# Patient Record
Sex: Female | Born: 1950
Health system: Southern US, Community
[De-identification: ages and names within clinical notes are randomized; demographics above are authoritative.]

## PROBLEM LIST (undated history)

## (undated) DIAGNOSIS — G4730 Sleep apnea, unspecified: Secondary | ICD-10-CM

## (undated) DIAGNOSIS — I10 Essential (primary) hypertension: Secondary | ICD-10-CM

## (undated) DIAGNOSIS — I890 Lymphedema, not elsewhere classified: Secondary | ICD-10-CM

## (undated) DIAGNOSIS — S81809A Unspecified open wound, unspecified lower leg, initial encounter: Secondary | ICD-10-CM

## (undated) DIAGNOSIS — L0390 Cellulitis, unspecified: Secondary | ICD-10-CM

## (undated) DIAGNOSIS — L97909 Non-pressure chronic ulcer of unspecified part of unspecified lower leg with unspecified severity: Secondary | ICD-10-CM

## (undated) HISTORY — DX: Non-pressure chronic ulcer of unspecified part of unspecified lower leg with unspecified severity: L97.909

## (undated) HISTORY — PX: ABDOMINAL HYSTERECTOMY: SHX81

## (undated) HISTORY — DX: Unspecified open wound, unspecified lower leg, initial encounter: S81.809A

---

## 2001-05-18 ENCOUNTER — Emergency Department (HOSPITAL_COMMUNITY): Admission: EM | Admit: 2001-05-18 | Discharge: 2001-05-18 | Payer: Self-pay | Admitting: Emergency Medicine

## 2007-02-12 ENCOUNTER — Ambulatory Visit (HOSPITAL_COMMUNITY): Admission: RE | Admit: 2007-02-12 | Discharge: 2007-02-12 | Payer: Self-pay | Admitting: Internal Medicine

## 2007-02-19 ENCOUNTER — Encounter: Admission: RE | Admit: 2007-02-19 | Discharge: 2007-02-19 | Payer: Self-pay | Admitting: Internal Medicine

## 2007-03-22 ENCOUNTER — Ambulatory Visit (HOSPITAL_BASED_OUTPATIENT_CLINIC_OR_DEPARTMENT_OTHER): Admission: RE | Admit: 2007-03-22 | Discharge: 2007-03-22 | Payer: Self-pay | Admitting: Internal Medicine

## 2007-03-31 ENCOUNTER — Ambulatory Visit: Payer: Self-pay | Admitting: Internal Medicine

## 2008-02-22 ENCOUNTER — Encounter: Payer: Self-pay | Admitting: Internal Medicine

## 2008-03-06 ENCOUNTER — Encounter: Payer: Self-pay | Admitting: Internal Medicine

## 2008-04-12 ENCOUNTER — Encounter: Payer: Self-pay | Admitting: Internal Medicine

## 2008-07-09 ENCOUNTER — Encounter: Payer: Self-pay | Admitting: Internal Medicine

## 2008-09-16 ENCOUNTER — Encounter: Payer: Self-pay | Admitting: Internal Medicine

## 2008-09-18 ENCOUNTER — Encounter: Payer: Self-pay | Admitting: Internal Medicine

## 2008-10-04 ENCOUNTER — Encounter: Payer: Self-pay | Admitting: Internal Medicine

## 2008-12-25 DIAGNOSIS — G4733 Obstructive sleep apnea (adult) (pediatric): Secondary | ICD-10-CM | POA: Insufficient documentation

## 2008-12-26 ENCOUNTER — Encounter: Payer: Self-pay | Admitting: Internal Medicine

## 2008-12-27 ENCOUNTER — Telehealth: Payer: Self-pay | Admitting: Internal Medicine

## 2010-10-17 NOTE — Progress Notes (Signed)
Summary: appt to establish care for cpap  Phone Note Call from Patient   Caller: Patient Call For: Dr. 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  Pt stated that her primary care physican Dr. Domingo Pulse would manage her CPAP Issues and refuse to schedule appt here. I advised her that I would contact [facility]) for her and advise them to fax over cpap information for her to address.  Please advise if I need to do anything else. Thanks Initial call taken by: Alfonso Ramus,  December 27, 2008 5:03 PM  Follow-up for Phone Call        Noted Follow-up by: Waymon Budge MD,  December 27, 2008 5:09 PM

## 2010-10-17 NOTE — Letter (Signed)
Summary: CMN/CPAP/[facility]  CMN/CPAP/[facility]   Imported By: Lester [place] 04/20/2008 12:39:39  _____________________________________________________________________  External Attachment:    Type:   Image     Comment:   External Document

## 2010-10-17 NOTE — Letter (Signed)
Summary: CMN-CPAP Equipment and Supplies/[facility]  CMN-CPAP Equipment and Supplies/[facility]   Imported By: Esmeralda Links D'jimraou 06/07/2008 13:32:28  _____________________________________________________________________  External Attachment:    Type:   Image     Comment:   External Document

## 2011-01-28 NOTE — Procedures (Signed)
NAMETHEO, REITHER              ACCOUNT NO.:  [id number]   MEDICAL RECORD NO.:  [id number]          PATIENT TYPE:  OUT   LOCATION:  SLEEP CENTER                 FACILITY:  [facility]   PHYSICIAN:  Clinton D. Maple Hudson, MD, FCCP, FACPDATE OF BIRTH:  1951-01-07   DATE OF STUDY:  03/22/2007                            NOCTURNAL POLYSOMNOGRAM   REFERRING PHYSICIAN:  Kyung Rudd   INDICATION FOR STUDY:  Hypersomnia with sleep apnea.   EPWORTH SLEEPINESS SCORE:  6/24.  BMI 37.8.  Weight 214 pounds.   MEDICATIONS:  Home medications are listed and reviewed.   SLEEP ARCHITECTURE:  Total sleep time 305 minutes with sleep efficiency  86%.  Stage N1 was 4%.  Stage N2 72%.  Stage N3 was 11%.  REM 12% of  total sleep time.  Sleep latency 20 minutes.  REM latency 80 minutes.  Awake after sleep onset 28 minutes.  Arousal index 2.9.  no bedtime  medication was taken.   RESPIRATORY DATA:  Diagnostic NPSG protocol as ordered.  AHI/RDI 12.2  obstructive events per hour indicating mild obstructive sleep  apnea/hypopnea syndrome. There were 11 obstructive apnea's and 51  hypopnea's.  Events were substantially more common while supine but also  significant in each lateral position.  REM AHI 60 per hour.   OXYGEN DATA:  Moderate to loud snoring with oxygen desaturation to a  nadir of 85%.  Mean oxygen saturation through the study was 96% on room  air.   CARDIAC DATA:  Sinus rhythm with occasional sinus arrhythmia and PAC's.   MOVEMENT-PARASOMNIA:  No significant movement disturbance.  Bathroom x1.   IMPRESSIONS-RECOMMENDATIONS:  1. Mild obstructive sleep apnea with apnea/hypopnea index.      Apnea/hypopnea index 12.2 per hour with events most common while      supine and in REM.  Moderate to loud snoring with oxygen      desaturation to a nadir of 85%.  2. Scores in this range may be addressed with CPAP if more      conservative measures are insufficient.  Consider      sleep off flat of back,  weight loss and treatment for any      significant nasal or upper airway obstruction.  If clinically      indicated, consider return for CPAP titration or alternative      therapies as appropriate.      Clinton D. Maple Hudson, MD, [facility], FACP  Diplomate, Biomedical engineer of Sleep Medicine  Electronically Signed     CDY/MEDQ  D:  03/31/2007 15:15:30  T:  04/01/2007 11:04:08  Job:  [id number]

## 2013-08-21 ENCOUNTER — Encounter (HOSPITAL_COMMUNITY): Payer: Self-pay | Admitting: Emergency Medicine

## 2013-08-21 ENCOUNTER — Inpatient Hospital Stay (HOSPITAL_COMMUNITY)
Admission: EM | Admit: 2013-08-21 | Discharge: 2013-09-01 | DRG: 575 | Disposition: A | Discharge status: Home / Self Care | Payer: 59 | Attending: Internal Medicine | Admitting: Internal Medicine

## 2013-08-21 DIAGNOSIS — Z8249 Family history of ischemic heart disease and other diseases of the circulatory system: Secondary | ICD-10-CM

## 2013-08-21 DIAGNOSIS — I10 Essential (primary) hypertension: Secondary | ICD-10-CM | POA: Diagnosis present

## 2013-08-21 DIAGNOSIS — T368X5A Adverse effect of other systemic antibiotics, initial encounter: Secondary | ICD-10-CM | POA: Diagnosis not present

## 2013-08-21 DIAGNOSIS — Z6836 Body mass index (BMI) 36.0-36.9, adult: Secondary | ICD-10-CM

## 2013-08-21 DIAGNOSIS — E876 Hypokalemia: Secondary | ICD-10-CM

## 2013-08-21 DIAGNOSIS — R509 Fever, unspecified: Secondary | ICD-10-CM | POA: Diagnosis not present

## 2013-08-21 DIAGNOSIS — G4733 Obstructive sleep apnea (adult) (pediatric): Secondary | ICD-10-CM | POA: Diagnosis present

## 2013-08-21 DIAGNOSIS — L02419 Cutaneous abscess of limb, unspecified: Principal | ICD-10-CM | POA: Diagnosis present

## 2013-08-21 DIAGNOSIS — L0390 Cellulitis, unspecified: Secondary | ICD-10-CM | POA: Diagnosis present

## 2013-08-21 DIAGNOSIS — Z889 Allergy status to unspecified drugs, medicaments and biological substances status: Secondary | ICD-10-CM

## 2013-08-21 DIAGNOSIS — A4901 Methicillin susceptible Staphylococcus aureus infection, unspecified site: Secondary | ICD-10-CM

## 2013-08-21 DIAGNOSIS — L03119 Cellulitis of unspecified part of limb: Secondary | ICD-10-CM

## 2013-08-21 DIAGNOSIS — L0291 Cutaneous abscess, unspecified: Secondary | ICD-10-CM

## 2013-08-21 DIAGNOSIS — R21 Rash and other nonspecific skin eruption: Secondary | ICD-10-CM | POA: Diagnosis not present

## 2013-08-21 DIAGNOSIS — Z91199 Patient's noncompliance with other medical treatment and regimen due to unspecified reason: Secondary | ICD-10-CM

## 2013-08-21 DIAGNOSIS — Z9119 Patient's noncompliance with other medical treatment and regimen: Secondary | ICD-10-CM

## 2013-08-21 HISTORY — DX: Essential (primary) hypertension: I10

## 2013-08-21 LAB — CBC WITH DIFFERENTIAL/PLATELET
Basophils Absolute: 0 10*3/uL (ref 0.0–0.1)
Basophils Relative: 0 % (ref 0–1)
Eosinophils Absolute: 0 10*3/uL (ref 0.0–0.7)
Eosinophils Relative: 0 % (ref 0–5)
HCT: 38.6 % (ref 36.0–46.0)
Hemoglobin: 13.2 g/dL (ref 12.0–15.0)
Lymphocytes Relative: 19 % (ref 12–46)
Lymphs Abs: 2.6 10*3/uL (ref 0.7–4.0)
MCH: 28.8 pg (ref 26.0–34.0)
MCHC: 34.2 g/dL (ref 30.0–36.0)
MCV: 84.3 fL (ref 78.0–100.0)
Monocytes Absolute: 1.4 10*3/uL — ABNORMAL HIGH (ref 0.1–1.0)
Monocytes Relative: 10 % (ref 3–12)
Neutro Abs: 9.8 10*3/uL — ABNORMAL HIGH (ref 1.7–7.7)
Neutrophils Relative %: 71 % (ref 43–77)
Platelets: 384 10*3/uL (ref 150–400)
RBC: 4.58 MIL/uL (ref 3.87–5.11)
RDW: 14.1 % (ref 11.5–15.5)
WBC: 13.8 10*3/uL — ABNORMAL HIGH (ref 4.0–10.5)

## 2013-08-21 LAB — CBC
HCT: 41 % (ref 36.0–46.0)
Hemoglobin: 13.8 g/dL (ref 12.0–15.0)
MCH: 28.7 pg (ref 26.0–34.0)
MCHC: 33.7 g/dL (ref 30.0–36.0)
MCV: 85.2 fL (ref 78.0–100.0)
Platelets: 362 10*3/uL (ref 150–400)
RBC: 4.81 MIL/uL (ref 3.87–5.11)
RDW: 14 % (ref 11.5–15.5)
WBC: 14.4 10*3/uL — ABNORMAL HIGH (ref 4.0–10.5)

## 2013-08-21 LAB — BASIC METABOLIC PANEL
BUN: 13 mg/dL (ref 6–23)
CO2: 24 mEq/L (ref 19–32)
Calcium: 9 mg/dL (ref 8.4–10.5)
Chloride: 101 mEq/L (ref 96–112)
Creatinine, Ser: 0.98 mg/dL (ref 0.50–1.10)
GFR calc Af Amer: 71 mL/min — ABNORMAL LOW (ref >90)
GFR calc non Af Amer: 61 mL/min — ABNORMAL LOW (ref >90)
Glucose, Bld: 91 mg/dL (ref 70–99)
Potassium: 4.3 mEq/L (ref 3.5–5.1)
Sodium: 136 mEq/L (ref 135–145)

## 2013-08-21 MED ORDER — PIPERACILLIN-TAZOBACTAM 3.375 G IVPB
3.3750 g | Freq: Three times a day (TID) | INTRAVENOUS | Status: DC
  Administered 2013-08-22 – 2013-08-23 (×5): 3.375 g via INTRAVENOUS
  Filled 2013-08-21 (×9): qty 50

## 2013-08-21 MED ORDER — ACETAMINOPHEN 650 MG RE SUPP: 650.0000 mg | Freq: Four times a day (QID) | RECTAL | Status: DC | PRN

## 2013-08-21 MED ORDER — AMLODIPINE BESYLATE 10 MG PO TABS
10.0000 mg | ORAL_TABLET | Freq: Every day | ORAL | Status: DC
  Administered 2013-08-21 – 2013-09-01 (×12): 10 mg via ORAL
  Filled 2013-08-21 (×12): qty 1

## 2013-08-21 MED ORDER — MORPHINE SULFATE 4 MG/ML IJ SOLN
4.0000 mg | Freq: Once | INTRAMUSCULAR | Status: AC
  Administered 2013-08-21: 4 mg via INTRAVENOUS
  Filled 2013-08-21: qty 1

## 2013-08-21 MED ORDER — ONDANSETRON HCL 4 MG PO TABS: 4.0000 mg | ORAL_TABLET | Freq: Four times a day (QID) | ORAL | Status: DC | PRN

## 2013-08-21 MED ORDER — HYDRALAZINE HCL 20 MG/ML IJ SOLN
10.0000 mg | INTRAMUSCULAR | Status: DC | PRN
  Administered 2013-08-21 – 2013-08-23 (×2): 10 mg via INTRAVENOUS
  Filled 2013-08-21 (×2): qty 1

## 2013-08-21 MED ORDER — ACETAMINOPHEN 325 MG PO TABS: 650.0000 mg | ORAL_TABLET | Freq: Four times a day (QID) | ORAL | Status: DC | PRN

## 2013-08-21 MED ORDER — SODIUM CHLORIDE 0.9 % IV SOLN
INTRAVENOUS | Status: DC
  Administered 2013-08-21: 20 mL/h via INTRAVENOUS
  Administered 2013-08-25 – 2013-08-26 (×2): 20 mL via INTRAVENOUS
  Administered 2013-08-27: 12:00:00 via INTRAVENOUS

## 2013-08-21 MED ORDER — VANCOMYCIN HCL 1000 MG IV SOLR: 20.0000 mg/kg | Freq: Once | INTRAVENOUS | Status: DC

## 2013-08-21 MED ORDER — VANCOMYCIN HCL IN DEXTROSE 1-5 GM/200ML-% IV SOLN
1000.0000 mg | Freq: Two times a day (BID) | INTRAVENOUS | Status: DC
  Administered 2013-08-22 (×2): 1000 mg via INTRAVENOUS
  Filled 2013-08-21 (×6): qty 200

## 2013-08-21 MED ORDER — ONDANSETRON HCL 4 MG/2ML IJ SOLN: 4.0000 mg | Freq: Four times a day (QID) | INTRAMUSCULAR | Status: DC | PRN

## 2013-08-21 MED ORDER — TETANUS-DIPHTH-ACELL PERTUSSIS 5-2.5-18.5 LF-MCG/0.5 IM SUSP
0.5000 mL | Freq: Once | INTRAMUSCULAR | Status: AC
  Administered 2013-08-21: 0.5 mL via INTRAMUSCULAR
  Filled 2013-08-21: qty 0.5

## 2013-08-21 MED ORDER — MORPHINE SULFATE 2 MG/ML IJ SOLN
1.0000 mg | INTRAMUSCULAR | Status: DC | PRN
  Administered 2013-08-22 – 2013-08-27 (×2): 1 mg via INTRAVENOUS
  Filled 2013-08-21 (×3): qty 1

## 2013-08-21 MED ORDER — VANCOMYCIN HCL 10 G IV SOLR
1750.0000 mg | INTRAVENOUS | Status: AC
  Administered 2013-08-21: 1750 mg via INTRAVENOUS
  Filled 2013-08-21: qty 1750

## 2013-08-21 NOTE — H&P (Signed)
[facility] History and Physical  Lindsay [name] WUJ:[id number] DOB: 29-Mar-1951 DOA: 08/21/2013  Referring physician: ER physician. PCP: No PCP Per Patient   Chief Complaint: Right leg swelling and discharge.  HPI: Lindsay [name] is a 62 y.o. female history of hypertension OSA started noticing a bump on the right shin last week which slowly grew over the last few days and on Thursday noticed increasing discharge. Patient use some Epsom salts but did not have any relief and came to the ER. In the ER ER physician tried to lance the swelling but there was no obvious abscess. Patient has been admitted for further management through IV antibiotics. Patient denies having any abscess drained in the recent past. Denies any chest pain shortness of breath nausea vomiting abdominal pain diarrhea.   Review of Systems: As presented in the history of presenting illness, rest negative.  Past Medical History  Diagnosis Date  . Hypertension    Past Surgical History  Procedure Laterality Date  . Abdominal hysterectomy     Social History:  reports that she has never smoked. She does not have any smokeless tobacco history on file. She reports that she does not drink alcohol or use illicit drugs. Where does patient live home. Can patient participate in ADLs? Yes.  No Known Allergies  Family History:  Family History  Problem Relation Age of Onset  . CAD Mother       Prior to Admission medications   Not on File    Physical Exam: Filed Vitals:   08/21/13 2000 08/21/13 2015 08/21/13 2030 08/21/13 2032  BP: 188/92 191/85 188/85 191/85  Pulse: 114 104 112   Temp:      TempSrc:      Resp:      Weight:      SpO2: 100% 99% 99%      General:  Well-developed well-nourished.  Eyes: Anicteric no pallor.  ENT: No discharge from ears eyes nose mouth.  Neck: No mass felt.  Cardiovascular: S1-S2 heard.  Respiratory: No rhonchi or crepitations.  Abdomen: Soft nontender bowel sounds  present.  Skin: Right anterior shin is erythematous and swollen with discharge which is mildly bloody. The swelling of the leg extends from the distal aspect of the right leg up to proximal aspect of her leg.  Musculoskeletal: As in skin.  Psychiatric: Appears normal.  Neurologic: Alert. Oriented to time place and person. Moves all extremities.  Labs on Admission:  Basic Metabolic Panel:  Recent Labs Lab 08/21/13 1400  NA 136  K 4.3  CL 101  CO2 24  GLUCOSE 91  BUN 13  CREATININE 0.98  CALCIUM 9.0   Liver Function Tests: No results found for this basename: AST, ALT, ALKPHOS, BILITOT, PROT, ALBUMIN,  in the last 168 hours No results found for this basename: LIPASE, AMYLASE,  in the last 168 hours No results found for this basename: AMMONIA,  in the last 168 hours CBC:  Recent Labs Lab 08/21/13 1400  WBC 14.4*  HGB 13.8  HCT 41.0  MCV 85.2  PLT 362   Cardiac Enzymes: No results found for this basename: CKTOTAL, CKMB, CKMBINDEX, TROPONINI,  in the last 168 hours  BNP (last 3 results) No results found for this basename: PROBNP,  in the last 8760 hours CBG: No results found for this basename: GLUCAP,  in the last 168 hours  Radiological Exams on Admission: No results found.   Assessment/Plan Principal Problem:   Cellulitis Active Problems:   OBSTRUCTIVE SLEEP APNEA  HTN (hypertension)   1. Cellulitis of the right leg - patient has been placed on vancomycin and Zosyn for now. Patient complaints of pain on moving or placing pressure. Presently has no symptoms compatible for compression syndrome. Check MRI of the leg. Keep leg elevated. 2. Hypertension uncontrolled - probably secondary to noncompliance with medications. Patient states that she usually takes Norvasc 10 mg but has not taken her last one month. For now I have placed patient on when necessary IV hydralazine and restart her on Norvasc. 3. History of OSA - CPAP per respiratory.    Code Status:  Full code.  Family Communication: Patient's daughter at the bedside.  Disposition Plan: Admit to inpatient.    Lionardo Haze N. [facility] Pager [phone].  If 7PM-7AM, please contact night-coverage [email or web address] Password [facility] 08/21/2013, 8:44 PM

## 2013-08-21 NOTE — ED Notes (Signed)
Admitting MD into room.  

## 2013-08-21 NOTE — ED Notes (Signed)
Suture cart at bedside 

## 2013-08-21 NOTE — Progress Notes (Addendum)
ANTIBIOTIC CONSULT NOTE - INITIAL  Pharmacy Consult for vancomycin/zosyn Indication: cellulitis  No Known Allergies  Patient Measurements: Height: 5' 3.6" (161.5 cm) Weight: 209 lb 8 oz (95.029 kg) IBW/kg (Calculated) : 53.78  Vital Signs: Temp: 98.9 F (37.2 C) (12/07 1191) Temp src: Oral (12/07 2058) BP: 190/106 mmHg (12/07 2058) Pulse Rate: 106 (12/07 2058) Intake/Output from previous day:   Intake/Output from this shift:    Labs:  Recent Labs  08/21/13 1400  WBC 14.4*  HGB 13.8  PLT 362  CREATININE 0.98   Estimated Creatinine Clearance: 67.7 ml/min (by C-G formula based on Cr of 0.98). No results found for this basename: VANCOTROUGH, VANCOPEAK, VANCORANDOM, GENTTROUGH, GENTPEAK, GENTRANDOM, TOBRATROUGH, TOBRAPEAK, TOBRARND, AMIKACINPEAK, AMIKACINTROU, AMIKACIN,  in the last 72 hours   Microbiology: No results found for this or any previous visit (from the past 720 hour(s)).  Medical History: Past Medical History  Diagnosis Date  . Hypertension     Medications:  No prescriptions prior to admission   Scheduled:  . amLODipine  10 mg Oral Daily    Assessment: 62 yo female with cellulitis to start vancomycin and zosyn. WBC= 14.4, SCr= 0.98 and CrCl ~ 70. A dose of 1750mg  IV vancomycin (~ 20mg /kg) was given at ~ 5:30 pm today.  12/7 vancomycin>>  12/7 wound cx  Goal of Therapy:  Vancomycin trough level 10-15 mcg/ml  Plan:  -Continue vancomycin as 1000mg  IV q12h (next dose at 6am on 12/8) -Zosyn 3.375gm IV q8h -Will follow renal function, cultures and clinical progress  Harland German, Pharm D 08/21/2013 9:26 PM

## 2013-08-21 NOTE — ED Notes (Signed)
Dr. Arlie Solomons at [facility] for I&D, local injection at this time. Pt updated. Report called to floor. Delay in transport d/t I&D.

## 2013-08-21 NOTE — ED Provider Notes (Signed)
CSN: [id number]     Arrival date & time 08/21/13  1319 History   First MD Initiated Contact with Patient 08/21/13 1537     Chief Complaint  Patient presents with  . Cellulitis   (Consider location/radiation/quality/duration/timing/severity/associated sxs/prior Treatment) HPI  Chief complaint, skin infection  Patient is a 62 year old female no significant past medical history comes in with chief complaint skin infection. Approximately 1.5 weeks ago patient reports having a bump on her right shin. This is causing in moderate discomfort however no significant pain. This continued grow over the course of the next week. It has finally come to a head and she began soaking in Epsom salts. With these treatments notes and minimal improvement. Patient states infection worsening now. States she's having moderate pain over right shin without radiation. Mainly dull, aching pain although sharp with palpations. It is localized to right shin without radiation. It is constant. Worsening. Associated erythema and warmth. Erythema spreading. Patient denies any chills or fevers at home.  Past Medical History  Diagnosis Date  . Hypertension    Past Surgical History  Procedure Laterality Date  . Abdominal hysterectomy     Family History  Problem Relation Age of Onset  . CAD Mother    History  Substance Use Topics  . Smoking status: Never Smoker   . Smokeless tobacco: Not on file  . Alcohol Use: No   OB History   Grav Para Term Preterm Abortions TAB SAB Ect Mult Living                 Review of Systems  Cardiovascular: Negative for chest pain.  Genitourinary: Negative for dysuria.  Musculoskeletal: Negative for back pain.  Skin: Positive for wound (RLE).  Psychiatric/Behavioral: Negative for agitation.  All other systems reviewed and are negative.    Allergies  Review of patient's allergies indicates no known allergies.  Home Medications  No current outpatient prescriptions on file. BP  179/94  Pulse 101  Temp(Src) 98.9 F (37.2 C) (Oral)  Resp 18  Ht 5' 3.6" (1.615 m)  Wt 209 lb 8 oz (95.029 kg)  BMI 36.43 kg/m2  SpO2 98% Physical Exam  Nursing note and vitals reviewed. Constitutional: She is oriented to person, place, and time. She appears well-developed and well-nourished.  HENT:  Head: Normocephalic and atraumatic.  Eyes: EOM are normal. Pupils are equal, round, and reactive to light.  Neck: Normal range of motion.  Cardiovascular: Regular rhythm and intact distal pulses.   Tachycardic   Pulmonary/Chest: Effort normal and breath sounds normal. No respiratory distress.  Abdominal: Soft. She exhibits no distension. There is no tenderness. There is no rebound.  Musculoskeletal: Normal range of motion.  Neurological: She is alert and oriented to person, place, and time. No cranial nerve deficit. She exhibits normal muscle tone. Coordination normal.  Skin: Skin is warm and dry.  Right lower extremity with large 1.5 cm in diameter and raised raw wound. Surrounding erythema surrounding erythema. Some tissue sloughing around wound. Swelling distal to infection   Psychiatric: She has a normal mood and affect. Her behavior is normal. Judgment and thought content normal.    ED Course  INCISION AND DRAINAGE Date/Time: 08/21/2013 11:24 PM Performed by: Novak Stgermaine Authorized by: Kristapher Dubuque Consent: Verbal consent obtained. Risks and benefits: risks, benefits and alternatives were discussed Consent given by: patient Type: abscess Body area: lower extremity (anterior RLE) Anesthesia: local infiltration Local anesthetic: lidocaine 1% without epinephrine Anesthetic total: 10 ml Patient sedated: no Scalpel size:  11 Incision type: single straight Complexity: simple Drainage: purulent Drainage amount: scant Wound treatment: wound left open Patient tolerance: Patient tolerated the procedure well with no immediate complications.   (including critical care  time) Labs Review Labs Reviewed  BASIC METABOLIC PANEL - Abnormal; Notable for the following:    GFR calc non Af Amer 61 (*)    GFR calc Af Amer 71 (*)    All other components within normal limits  CBC - Abnormal; Notable for the following:    WBC 14.4 (*)    All other components within normal limits  CBC WITH DIFFERENTIAL - Abnormal; Notable for the following:    WBC 13.8 (*)    Neutro Abs 9.8 (*)    Monocytes Absolute 1.4 (*)    All other components within normal limits  WOUND CULTURE  COMPREHENSIVE METABOLIC PANEL  SEDIMENTATION RATE   Imaging Review No results found.  EKG Interpretation    Date/Time:    Ventricular Rate:    PR Interval:    QRS Duration:   QT Interval:    QTC Calculation:   R Axis:     Text Interpretation:              MDM   1. Cellulitis   2. HTN (hypertension)   3. Obstructive sleep apnea (adult) (pediatric)     Afebrile vital signs stable on arrival. Patient with right lower extremity infection. Appears patient had a small abscess which is now grown significantly with surrounding cellulitis. There is a large approximately 1.5 inch raised area with superficial ulceration of the epidermis. I&D was performed of this area with concern for underlying abscess. Wound culture sent. Scant purulent drainage noted. Patient also with some swelling distal to this. Likely result of infectious. Patient noted leukocytosis. BMP unremarkable. Exam and history not consistent with DVT or other serious pathology such as arterial or venous occlusion. Patient neurovascularly intact. Infectious symptoms seem localized to the immediate area of erythema. No significant spreading over joint lines. Patient was given vancomycin in ED. Consult medicine for admission given extensive cellulitis and need for IV antibiotics. Patient remained stable in the ED with no further issues until transfer.   Bridgett Larsson, MD 08/21/13 [phone]

## 2013-08-21 NOTE — Progress Notes (Signed)
NURSING PROGRESS NOTE  Lindsay [name] [id number] Admission Data: 08/21/2013 2058 Attending Provider: Eduard Clos, MD PCP:No PCP Per Patient Code Status: Full  Lindsay [name] is a 62 y.o. female patient admitted from ED:  -No acute distress noted.  -No complaints of shortness of breath.  -No complaints of chest pain.    Blood pressure 198/107, pulse 101, temperature 98.9 F (37.2 C), temperature source Oral, resp. rate 18, height 5' 3.6" (1.615 m), weight 95.029 kg (209 lb 8 oz), SpO2 98.00%.   IV Fluids:  IV in place, occlusive dsg intact without redness, IV cath antecubital right, condition patent and no redness normal saline.   Allergies:  Review of patient's allergies indicates no known allergies.  Past Medical History:   has a past medical history of Hypertension.  Past Surgical History:   has past surgical history that includes Abdominal hysterectomy.  Social History:   reports that she has never smoked. She does not have any smokeless tobacco history on file. She reports that she does not drink alcohol or use illicit drugs.  Skin: Cellulitis in right lower extremity, red, swollen and weeping per ED RN. At this time the area is covered with gauze and kerlex that is clean, dry and intact.  Patient/Family orientated to room. Information packet given to patient/family. Admission inpatient armband information verified with patient/family to include name and date of birth and placed on patient arm. Side rails up x 2, fall assessment and education completed with patient/family. Patient/family able to verbalize understanding of risk associated with falls and verbalized understanding to call for assistance before getting out of bed. Call light within reach. Patient/family able to voice and demonstrate understanding of unit orientation instructions.

## 2013-08-21 NOTE — ED Notes (Signed)
I&D of R lateral wound complete, dry sterile gauze applied to wound with kling, culture obtained & sent, pain med, Tdap and hydralazine given, pt tolerated well, no changes, VSS. Pt up to floor with family and EMT, up via stretcher.

## 2013-08-21 NOTE — ED Notes (Signed)
Pt reports she noticed what looked like an insect bite to R outer shin before thanksgiving. States the pain, redness and swelling to area has increased since despite epsom salt soaks, daily wound care, and neosporin application.

## 2013-08-21 NOTE — Progress Notes (Signed)
Called ED for report. RN unavailable at this time. Left direct phone number for call back.

## 2013-08-22 ENCOUNTER — Inpatient Hospital Stay (HOSPITAL_COMMUNITY): Payer: 59

## 2013-08-22 DIAGNOSIS — L03119 Cellulitis of unspecified part of limb: Principal | ICD-10-CM

## 2013-08-22 DIAGNOSIS — L02419 Cutaneous abscess of limb, unspecified: Principal | ICD-10-CM

## 2013-08-22 DIAGNOSIS — E876 Hypokalemia: Secondary | ICD-10-CM

## 2013-08-22 LAB — COMPREHENSIVE METABOLIC PANEL
ALT: 11 U/L (ref 0–35)
AST: 11 U/L (ref 0–37)
Albumin: 3 g/dL — ABNORMAL LOW (ref 3.5–5.2)
Alkaline Phosphatase: 69 U/L (ref 39–117)
BUN: 11 mg/dL (ref 6–23)
CO2: 25 mEq/L (ref 19–32)
Calcium: 8.9 mg/dL (ref 8.4–10.5)
Chloride: 103 mEq/L (ref 96–112)
Creatinine, Ser: 1.1 mg/dL (ref 0.50–1.10)
GFR calc Af Amer: 62 mL/min — ABNORMAL LOW (ref >90)
GFR calc non Af Amer: 53 mL/min — ABNORMAL LOW (ref >90)
Glucose, Bld: 101 mg/dL — ABNORMAL HIGH (ref 70–99)
Potassium: 3.3 mEq/L — ABNORMAL LOW (ref 3.5–5.1)
Sodium: 137 mEq/L (ref 135–145)
Total Bilirubin: 0.6 mg/dL (ref 0.3–1.2)
Total Protein: 7.6 g/dL (ref 6.0–8.3)

## 2013-08-22 LAB — SEDIMENTATION RATE: Sed Rate: 45 mm/hr — ABNORMAL HIGH (ref 0–22)

## 2013-08-22 MED ORDER — HYDROCODONE-ACETAMINOPHEN 5-325 MG PO TABS
1.0000 | ORAL_TABLET | Freq: Four times a day (QID) | ORAL | Status: DC | PRN
  Administered 2013-08-22 – 2013-09-01 (×15): 1 via ORAL
  Filled 2013-08-22 (×15): qty 1

## 2013-08-22 MED ORDER — HEPARIN SODIUM (PORCINE) 5000 UNIT/ML IJ SOLN
5000.0000 [IU] | Freq: Three times a day (TID) | INTRAMUSCULAR | Status: DC
  Administered 2013-08-22 – 2013-09-01 (×27): 5000 [IU] via SUBCUTANEOUS
  Filled 2013-08-22 (×33): qty 1

## 2013-08-22 MED ORDER — GADOBENATE DIMEGLUMINE 529 MG/ML IV SOLN
20.0000 mL | Freq: Once | INTRAVENOUS | Status: AC
  Administered 2013-08-22: 20 mL via INTRAVENOUS

## 2013-08-22 MED ORDER — POTASSIUM CHLORIDE CRYS ER 20 MEQ PO TBCR
40.0000 meq | EXTENDED_RELEASE_TABLET | Freq: Once | ORAL | Status: AC
  Administered 2013-08-22: 40 meq via ORAL
  Filled 2013-08-22 (×2): qty 2

## 2013-08-22 NOTE — Progress Notes (Signed)
UR completed. Cecelia Graciano RN CCM Case Mgmt phone [phone] 

## 2013-08-22 NOTE — Progress Notes (Signed)
Pt called RN into room, family/pt stated pt was having fine rash pop up after vancomycin iv started. RN stopped infusion and called MD.

## 2013-08-22 NOTE — Progress Notes (Addendum)
[facility] PROGRESS NOTE  Lindsay [name] ZOX:[id number] DOB: [birth year] DOA: 08/21/2013 PCP: No PCP Per Patient  Assessment/Plan: 1. Cellulitis/abscess of right lower extremity. Patient presenting with clinical signs and symptoms consistent with saliva/abscess, undergoing incision and drainage in the emergency department, wound cultures are pending at the time of this dictation. She was started on empiric IV antibiotic therapy with Zosyn and vancomycin. Will followup on wound cultures. Provide supportive care 2. Hypertension. Patient initially presented with elevated blood pressures, having systolic blood pressures in the 170s to 190s, improved this morning to 147/82. It's possible that pain may have led to elevated blood pressures, she remains on amlodipine 10 mg by mouth daily. Will monitor her blood pressures over the course of the day. 3. Hypokalemia. A.m. Lab work showing a potassium of 3.3, will administer Kdur PO x 1 dose.  4. Pain management. She is on morphine 1 mg IV every 4 hours as needed and Norco 5/325 one tablet every 6 hours as needed  Code Status: Full code Disposition Plan: Continue empiric IV antibiotic therapy followup on cultures   Procedures:  Incision and drainage of abscess performed in the emergency department on 08/21/2013  Antibiotics:  Zosyn (started on 08/21/2013)  Vancomycin (started on 08/21/2013)  HPI/Subjective: Patient is a pleasant 62 year old female with a past medical history of hypertension who was admitted overnight, presented with complaints of right lower extremity swelling and purulent discharge. She states noted above in her right leg since Thanksgiving. Erythema and swelling have progressively worsened since then. She underwent incision and drainage of a 1.5 inch raised region suspicious for underlying abscess. Scant purulent drainage was noted, wound cultures were sent. She was started on empiric IV antibiotic therapy with IV  vancomycin and Zosyn. This morning she reports feeling a little better, tolerating by mouth intake, remains afebrile and hemodynamically stable.  Objective: Filed Vitals:   08/22/13 0620  BP: 147/82  Pulse: 90  Temp: 98.3 F (36.8 C)  Resp: 17    Intake/Output Summary (Last 24 hours) at 08/22/13 1046 Last data filed at 08/22/13 0900  Gross per 24 hour  Intake    240 ml  Output      0 ml  Net    240 ml   Filed Weights   08/21/13 1702 08/21/13 2058  Weight: 94.348 kg (208 lb) 95.029 kg (209 lb 8 oz)    Exam:   General:  No acute distress she is awake alert and oriented x3  Cardiovascular: Regular rate rhythm normal S1 is  Respiratory: Lungs are clear to auscultation bilaterally no wheezing rhonchi or rales  Abdomen: Soft nontender nondistended  Musculoskeletal: Right lower extremity swelling, erythema extending from ankle to below the knee. Area of incision and drainage, having minimal purulent drainage. Region is tender to palpation as well.  Data Reviewed: Basic Metabolic Panel:  Recent Labs Lab 08/21/13 1400 08/22/13 0710  NA 136 137  K 4.3 3.3*  CL 101 103  CO2 24 25  GLUCOSE 91 101*  BUN 13 11  CREATININE 0.98 1.10  CALCIUM 9.0 8.9   Liver Function Tests:  Recent Labs Lab 08/22/13 0710  AST 11  ALT 11  ALKPHOS 69  BILITOT 0.6  PROT 7.6  ALBUMIN 3.0*   No results found for this basename: LIPASE, AMYLASE,  in the last 168 hours No results found for this basename: AMMONIA,  in the last 168 hours CBC:  Recent Labs Lab 08/21/13 1400 08/21/13 2240  WBC 14.4* 13.8*  NEUTROABS  --  9.8*  HGB 13.8 13.2  HCT 41.0 38.6  MCV 85.2 84.3  PLT 362 384   Cardiac Enzymes: No results found for this basename: CKTOTAL, CKMB, CKMBINDEX, TROPONINI,  in the last 168 hours BNP (last 3 results) No results found for this basename: PROBNP,  in the last 8760 hours CBG: No results found for this basename: GLUCAP,  in the last 168 hours  Recent Results (from  the past 240 hour(s))  WOUND CULTURE     Status: None   Collection Time    08/21/13  8:43 PM      Result Value Range Status   Specimen Description WOUND   Final   Special Requests RIGHT LATERAL CALF PATIENT ON FOLLOWING VANCOMYCIN   Final   Gram Stain PENDING   Incomplete   Culture     Final   Value: Culture reincubated for better growth     Performed at [facility]   Report Status PENDING   Incomplete     Studies: No results found.  Scheduled Meds: . amLODipine  10 mg Oral Daily  . heparin subcutaneous  5,000 Units Subcutaneous Q8H  . piperacillin-tazobactam (ZOSYN)  IV  3.375 g Intravenous Q8H  . vancomycin  1,000 mg Intravenous Q12H   Continuous Infusions: . sodium chloride 20 mL/hr (08/21/13 2229)    Principal Problem:   Cellulitis Active Problems:   OBSTRUCTIVE SLEEP APNEA   HTN (hypertension)    Time spent: 35 min    [name]  [facility] Pager [phone]. If 7PM-7AM, please contact night-coverage at [email or web address], password [facility] 08/22/2013, 10:46 AM  LOS: 1 day

## 2013-08-23 ENCOUNTER — Inpatient Hospital Stay (HOSPITAL_COMMUNITY): Payer: 59 | Admitting: Anesthesiology

## 2013-08-23 ENCOUNTER — Encounter (HOSPITAL_COMMUNITY)
Admission: EM | Disposition: A | Discharge status: Home / Self Care | Payer: Self-pay | Source: Home / Self Care | Attending: Internal Medicine

## 2013-08-23 ENCOUNTER — Encounter (HOSPITAL_COMMUNITY): Payer: Self-pay | Admitting: Anesthesiology

## 2013-08-23 ENCOUNTER — Encounter (HOSPITAL_COMMUNITY): Payer: 59 | Admitting: Anesthesiology

## 2013-08-23 HISTORY — PX: APPLICATION OF WOUND VAC: SHX5189

## 2013-08-23 HISTORY — PX: I & D EXTREMITY: SHX5045

## 2013-08-23 LAB — CBC
HCT: 38.8 % (ref 36.0–46.0)
Hemoglobin: 12.9 g/dL (ref 12.0–15.0)
MCH: 28.5 pg (ref 26.0–34.0)
MCHC: 33.2 g/dL (ref 30.0–36.0)
MCV: 85.8 fL (ref 78.0–100.0)
Platelets: 394 10*3/uL (ref 150–400)
RBC: 4.52 MIL/uL (ref 3.87–5.11)
RDW: 14.1 % (ref 11.5–15.5)
WBC: 13.3 10*3/uL — ABNORMAL HIGH (ref 4.0–10.5)

## 2013-08-23 LAB — BASIC METABOLIC PANEL
BUN: 19 mg/dL (ref 6–23)
CO2: 23 mEq/L (ref 19–32)
Calcium: 8.7 mg/dL (ref 8.4–10.5)
Chloride: 103 mEq/L (ref 96–112)
Creatinine, Ser: 1.31 mg/dL — ABNORMAL HIGH (ref 0.50–1.10)
GFR calc Af Amer: 50 mL/min — ABNORMAL LOW (ref >90)
GFR calc non Af Amer: 43 mL/min — ABNORMAL LOW (ref >90)
Glucose, Bld: 106 mg/dL — ABNORMAL HIGH (ref 70–99)
Potassium: 3.6 mEq/L (ref 3.5–5.1)
Sodium: 138 mEq/L (ref 135–145)

## 2013-08-23 LAB — SURGICAL PCR SCREEN
MRSA, PCR: NEGATIVE
Staphylococcus aureus: NEGATIVE

## 2013-08-23 SURGERY — IRRIGATION AND DEBRIDEMENT EXTREMITY
Anesthesia: General | Site: Leg Lower | Laterality: Right

## 2013-08-23 MED ORDER — SODIUM CHLORIDE 0.9 % IV SOLN
6.0000 mg/kg | INTRAVENOUS | Status: DC
  Administered 2013-08-23 – 2013-08-24 (×2): 570 mg via INTRAVENOUS
  Filled 2013-08-23 (×3): qty 11.4

## 2013-08-23 MED ORDER — MIDAZOLAM HCL 5 MG/5ML IJ SOLN
INTRAMUSCULAR | Status: DC | PRN
  Administered 2013-08-23: 2 mg via INTRAVENOUS

## 2013-08-23 MED ORDER — SODIUM CHLORIDE 0.9 % IR SOLN
Status: DC | PRN
  Administered 2013-08-23: 3000 mL

## 2013-08-23 MED ORDER — HYDROMORPHONE HCL PF 1 MG/ML IJ SOLN
0.2500 mg | INTRAMUSCULAR | Status: DC | PRN
  Administered 2013-08-23 (×2): 0.5 mg via INTRAVENOUS

## 2013-08-23 MED ORDER — HYDROMORPHONE HCL PF 1 MG/ML IJ SOLN
INTRAMUSCULAR | Status: AC
  Filled 2013-08-23: qty 1

## 2013-08-23 MED ORDER — LIDOCAINE HCL (CARDIAC) 20 MG/ML IV SOLN
INTRAVENOUS | Status: DC | PRN
  Administered 2013-08-23: 100 mg via INTRAVENOUS

## 2013-08-23 MED ORDER — OXYCODONE HCL 5 MG PO TABS
ORAL_TABLET | ORAL | Status: AC
  Filled 2013-08-23: qty 1

## 2013-08-23 MED ORDER — 0.9 % SODIUM CHLORIDE (POUR BTL) OPTIME
TOPICAL | Status: DC | PRN
  Administered 2013-08-23: 1000 mL

## 2013-08-23 MED ORDER — FENTANYL CITRATE 0.05 MG/ML IJ SOLN
INTRAMUSCULAR | Status: DC | PRN
  Administered 2013-08-23 (×2): 100 ug via INTRAVENOUS
  Administered 2013-08-23: 50 ug via INTRAVENOUS

## 2013-08-23 MED ORDER — CLINDAMYCIN PHOSPHATE 600 MG/50ML IV SOLN
600.0000 mg | Freq: Three times a day (TID) | INTRAVENOUS | Status: DC
  Administered 2013-08-23 – 2013-08-24 (×3): 600 mg via INTRAVENOUS
  Filled 2013-08-23 (×6): qty 50

## 2013-08-23 MED ORDER — SODIUM CHLORIDE 0.9 % IV SOLN
8.0000 mg/kg | INTRAVENOUS | Status: DC
  Filled 2013-08-23: qty 15.2

## 2013-08-23 MED ORDER — PROPOFOL 10 MG/ML IV BOLUS
INTRAVENOUS | Status: DC | PRN
  Administered 2013-08-23: 50 mg via INTRAVENOUS
  Administered 2013-08-23: 150 mg via INTRAVENOUS

## 2013-08-23 MED ORDER — POTASSIUM CHLORIDE CRYS ER 20 MEQ PO TBCR
40.0000 meq | EXTENDED_RELEASE_TABLET | Freq: Once | ORAL | Status: AC
  Administered 2013-08-23: 40 meq via ORAL
  Filled 2013-08-23: qty 2

## 2013-08-23 MED ORDER — LACTATED RINGERS IV SOLN
INTRAVENOUS | Status: DC
  Administered 2013-08-23: 17:00:00 via INTRAVENOUS

## 2013-08-23 MED ORDER — ONDANSETRON HCL 4 MG/2ML IJ SOLN
INTRAMUSCULAR | Status: DC | PRN
  Administered 2013-08-23: 4 mg via INTRAVENOUS

## 2013-08-23 SURGICAL SUPPLY — 62 items
BANDAGE CONFORM 3  STR LF (GAUZE/BANDAGES/DRESSINGS) IMPLANT
BANDAGE ELASTIC 3 VELCRO ST LF (GAUZE/BANDAGES/DRESSINGS) IMPLANT
BANDAGE ELASTIC 4 VELCRO ST LF (GAUZE/BANDAGES/DRESSINGS) ×2 IMPLANT
BLADE SURG 10 STRL SS (BLADE) ×2 IMPLANT
BNDG COHESIVE 1X5 TAN STRL LF (GAUZE/BANDAGES/DRESSINGS) IMPLANT
BNDG COHESIVE 4X5 TAN STRL (GAUZE/BANDAGES/DRESSINGS) ×2 IMPLANT
BNDG COHESIVE 6X5 TAN STRL LF (GAUZE/BANDAGES/DRESSINGS) IMPLANT
BNDG GAUZE STRTCH 6 (GAUZE/BANDAGES/DRESSINGS) IMPLANT
CANISTER WOUND CARE 500ML ATS (WOUND CARE) ×2 IMPLANT
CLOTH BEACON ORANGE TIMEOUT ST (SAFETY) ×2 IMPLANT
CORDS BIPOLAR (ELECTRODE) IMPLANT
COVER SURGICAL LIGHT HANDLE (MISCELLANEOUS) ×2 IMPLANT
CUFF TOURNIQUET SINGLE 18IN (TOURNIQUET CUFF) ×2 IMPLANT
CUFF TOURNIQUET SINGLE 24IN (TOURNIQUET CUFF) IMPLANT
CUFF TOURNIQUET SINGLE 34IN LL (TOURNIQUET CUFF) ×2 IMPLANT
CUFF TOURNIQUET SINGLE 44IN (TOURNIQUET CUFF) IMPLANT
DRAPE INCISE IOBAN 66X45 STRL (DRAPES) ×2 IMPLANT
DRAPE ORTHO SPLIT 77X108 STRL (DRAPES) ×2
DRAPE SURG 17X23 STRL (DRAPES) IMPLANT
DRAPE SURG ORHT 6 SPLT 77X108 (DRAPES) ×1 IMPLANT
DRAPE U-SHAPE 47X51 STRL (DRAPES) ×2 IMPLANT
DRSG VAC ATS MED SENSATRAC (GAUZE/BANDAGES/DRESSINGS) ×2 IMPLANT
DURAPREP 26ML APPLICATOR (WOUND CARE) IMPLANT
ELECT CAUTERY BLADE 6.4 (BLADE) IMPLANT
ELECT REM PT RETURN 9FT ADLT (ELECTROSURGICAL)
ELECTRODE REM PT RTRN 9FT ADLT (ELECTROSURGICAL) IMPLANT
GAUZE XEROFORM 1X8 LF (GAUZE/BANDAGES/DRESSINGS) IMPLANT
GLOVE BIO SURGEON STRL SZ8 (GLOVE) ×2 IMPLANT
GLOVE BIOGEL PI IND STRL 6.5 (GLOVE) ×2 IMPLANT
GLOVE BIOGEL PI IND STRL 8 (GLOVE) ×3 IMPLANT
GLOVE BIOGEL PI INDICATOR 6.5 (GLOVE) ×2
GLOVE BIOGEL PI INDICATOR 8 (GLOVE) ×3
GLOVE ORTHO TXT STRL SZ7.5 (GLOVE) ×2 IMPLANT
GLOVE SURG SS PI 6.0 STRL IVOR (GLOVE) ×2 IMPLANT
GOWN PREVENTION PLUS LG XLONG (DISPOSABLE) IMPLANT
GOWN PREVENTION PLUS XLARGE (GOWN DISPOSABLE) ×4 IMPLANT
GOWN STRL NON-REIN LRG LVL3 (GOWN DISPOSABLE) ×2 IMPLANT
HANDPIECE INTERPULSE COAX TIP (DISPOSABLE) ×2
KIT BASIN OR (CUSTOM PROCEDURE TRAY) ×2 IMPLANT
KIT ROOM TURNOVER OR (KITS) ×2 IMPLANT
MANIFOLD NEPTUNE II (INSTRUMENTS) ×2 IMPLANT
NS IRRIG 1000ML POUR BTL (IV SOLUTION) ×2 IMPLANT
PACK ORTHO EXTREMITY (CUSTOM PROCEDURE TRAY) ×2 IMPLANT
PAD ARMBOARD 7.5X6 YLW CONV (MISCELLANEOUS) ×4 IMPLANT
PADDING CAST ABS 4INX4YD NS (CAST SUPPLIES) ×2
PADDING CAST ABS COTTON 4X4 ST (CAST SUPPLIES) ×2 IMPLANT
PADDING CAST COTTON 6X4 STRL (CAST SUPPLIES) ×2 IMPLANT
SET HNDPC FAN SPRY TIP SCT (DISPOSABLE) ×1 IMPLANT
SPONGE GAUZE 4X4 12PLY (GAUZE/BANDAGES/DRESSINGS) ×2 IMPLANT
SPONGE LAP 18X18 X RAY DECT (DISPOSABLE) ×6 IMPLANT
STOCKINETTE IMPERVIOUS 9X36 MD (GAUZE/BANDAGES/DRESSINGS) IMPLANT
SUT ETHILON 2 0 FS 18 (SUTURE) ×4 IMPLANT
SUT ETHILON 3 0 PS 1 (SUTURE) ×4 IMPLANT
SYR CONTROL 10ML LL (SYRINGE) IMPLANT
TOWEL OR 17X24 6PK STRL BLUE (TOWEL DISPOSABLE) ×2 IMPLANT
TOWEL OR 17X26 10 PK STRL BLUE (TOWEL DISPOSABLE) ×2 IMPLANT
TUBE ANAEROBIC SPECIMEN COL (MISCELLANEOUS) IMPLANT
TUBE CONNECTING 12X1/4 (SUCTIONS) ×2 IMPLANT
TUBE FEEDING 5FR 15 INCH (TUBING) IMPLANT
UNDERPAD 30X30 INCONTINENT (UNDERPADS AND DIAPERS) ×2 IMPLANT
WATER STERILE IRR 1000ML POUR (IV SOLUTION) ×2 IMPLANT
YANKAUER SUCT BULB TIP NO VENT (SUCTIONS) ×2 IMPLANT

## 2013-08-23 NOTE — Progress Notes (Signed)
Patient is transferred to OR for I&D at this time.

## 2013-08-23 NOTE — ED Provider Notes (Addendum)
I saw and evaluated the patient, reviewed the resident's note and I agree with the findings and plan.  I was presents for key portions of residents I and D of leg abscess, small amt purulent drainage noted, cx sent.   Pt with cellulitis/abscess to right lower leg, foot. I and D abscess. Iv abx. Distal pulses palp. No crepitus. Admit.   Suzi Roots, MD 08/23/13 1610  Suzi Roots, MD 09/19/13 [phone]

## 2013-08-23 NOTE — Brief Op Note (Signed)
08/21/2013 - 08/23/2013  7:09 PM  PATIENT:  Lindsay [name]  62 y.o. female  PRE-OPERATIVE DIAGNOSIS:  Right Leg Abscess  POST-OPERATIVE DIAGNOSIS:  Right Leg abscess  PROCEDURE:  Procedure(s): I&D Right Leg Abscess (Right) APPLICATION OF WOUND VAC (Right)  SURGEON:  Surgeon(s) and Role:    * Kathryne Hitch, MD - Primary  PHYSICIAN ASSISTANT: Rexene Edison, PA-C  ANESTHESIA:   general  EBL:   <50 cc  BLOOD ADMINISTERED:none  DRAINS: none   LOCAL MEDICATIONS USED:  NONE  SPECIMEN:  No Specimen  DISPOSITION OF SPECIMEN:  N/A  COUNTS:  YES  TOURNIQUET:   Total Tourniquet Time Documented: Thigh (Right) - 22 minutes Total: Thigh (Right) - 22 minutes   DICTATION: .Other Dictation: Dictation Number [id number]  PLAN OF CARE: Admit to inpatient   PATIENT DISPOSITION:  PACU - hemodynamically stable.   Delay start of Pharmacological VTE agent (>24hrs) due to surgical blood loss or risk of bleeding: not applicable

## 2013-08-23 NOTE — Anesthesia Procedure Notes (Signed)
Procedure Name: LMA Insertion Date/Time: 08/23/2013 5:56 PM Performed by: Tyrone Nine Pre-anesthesia Checklist: Patient identified, Timeout performed, Emergency Drugs available, Suction available and Patient being monitored Patient Re-evaluated:Patient Re-evaluated prior to inductionOxygen Delivery Method: Circle system utilized Preoxygenation: Pre-oxygenation with 100% oxygen Intubation Type: IV induction Ventilation: Mask ventilation without difficulty LMA: LMA inserted LMA Size: 4.0 Number of attempts: 1 Placement Confirmation: positive ETCO2 Tube secured with: Tape Dental Injury: Teeth and Oropharynx as per pre-operative assessment

## 2013-08-23 NOTE — Anesthesia Preprocedure Evaluation (Addendum)
Anesthesia Evaluation  Patient identified by MRN, date of birth, ID band Patient awake, Patient confused and Patient unresponsive    Reviewed: Allergy & Precautions, H&P , NPO status , Patient's Chart, lab work & pertinent test results  Airway Mallampati: II TM Distance: >3 FB Neck ROM: Full    Dental  (+) Partial Lower and Dental Advisory Given   Pulmonary sleep apnea and Continuous Positive Airway Pressure Ventilation ,    Pulmonary exam normal       Cardiovascular Exercise Tolerance: Good hypertension, Pt. on medications Rhythm:Regular Rate:Normal     Neuro/Psych negative neurological ROS  negative psych ROS   GI/Hepatic negative GI ROS, Neg liver ROS,   Endo/Other  negative endocrine ROS  Renal/GU negative Renal ROS     Musculoskeletal negative musculoskeletal ROS (+)   Abdominal   Peds  Hematology negative hematology ROS (+)   Anesthesia Other Findings 08-21-13 Wound culture   2d ago   Specimen Description WOUND     Special Requests RIGHT LATERAL CALF PATIENT ON FOLLOWING VANCOMYCIN     Gram Stain NO WBC SEEN NO SQUAMOUS EPITHELIAL CELLS SEEN FEW GRAM POSITIVE COCCI IN PAIRS Performed at [facility]    Culture MODERATE STAPHYLOCOCCUS AUREUS      Reproductive/Obstetrics                         Anesthesia Physical Anesthesia Plan  ASA: II and emergent  Anesthesia Plan: General   Post-op Pain Management:    Induction: Intravenous  Airway Management Planned: LMA  Additional Equipment:   Intra-op Plan:   Post-operative Plan: Extubation in OR  Informed Consent: I have reviewed the patients History and Physical, chart, labs and discussed the procedure including the risks, benefits and alternatives for the proposed anesthesia with the patient or authorized representative who has indicated his/her understanding and acceptance.   Dental advisory given  Plan  Discussed with: Anesthesiologist, CRNA and Surgeon  Anesthesia Plan Comments:         Anesthesia Quick Evaluation

## 2013-08-23 NOTE — Progress Notes (Signed)
RT placed patient on cpap 10cmH20 via full face mask. Patient is tolerating cpap well at this time.

## 2013-08-23 NOTE — Care Management Note (Signed)
Page 1 of 2   09/01/2013     10:43:07 AM   CARE MANAGEMENT NOTE 09/01/2013  Patient:  Lindsay [name], Lindsay [name]   Account Number:  [id number]  Date Initiated:  08/23/2013  Documentation initiated by:  Letha Cape  Subjective/Objective Assessment:   dx cellulitis/abscess  admit- lives alone. pta indep.     Action/Plan:   has vac on ? if will need when dc home  per ortho.   Anticipated DC Date:  09/01/2013   Anticipated DC Plan:  HOME W HOME HEALTH SERVICES  In-house referral  Clinical Social Worker      DC Planning Services  CM consult  Follow-up appt scheduled      [facility] Choice  HOME HEALTH   Choice offered to / List presented to:  C-1 Patient   DME arranged  WALKER - ROLLING  WHEELCHAIR - MANUAL      DME agency  [facility]     HH arranged  HH-1 RN  HH-2 PT  HH-3 OT      [facility] agency  [facility]   Status of service:  Completed, signed off Medicare Important Message given?   (If response is "NO", the following Medicare IM given date fields will be blank) Date Medicare IM given:   Date Additional Medicare IM given:    Discharge Disposition:  HOME W HOME HEALTH SERVICES  Per UR Regulation:  Reviewed for med. necessity/level of care/duration of stay  If discussed at Long Length of Stay Meetings, dates discussed:    Comments:  09/01/13 10:40 Letha Cape RN, BSN [phone] patient is for dc today, [facility] notified, also notified about wound care dressing changes as well. Patient will go home with invanz iv abx, Pam the liason with [facility] will come by patient's room to do some education about the iv abx as well.  08/31/13 13:38 Letha Cape RN, BSN [phone] NCM received call from [facility], Lupita Leash stating that patient's co-pay for Cubucin would be $1234.00 per week, patient can not afford that, NCM informed ID MD , Dr. Ninetta Lights.  Dr. Ninetta Lights has recommened some other iv abx for patient to go home with ,  NCM will await decision and inform [facility] when  decision is made what abx patient will go home with.  NCM received call from attending Md stating patient will go home on Invanz 1 gm qd, NCM called [facility] with this information, they state they will check the co pay on this medication.  08/30/13 12:08 Letha Cape RN, BSN [phone] patient is for possible dc tomorrow, patient chose [facility] for [facility], PT, OT.  Referral given to [facility], Lupita Leash notified.  Soc will begin 24-48 hrs post discharge.  Per ID note patient will need 2-3 weeks of abx, will await further information, if this is iv abx patient will need a picc line inserted.  08/25/13 16:44 Letha Cape RN, BSN [phone] patient wound vac was d/c'd, patient is receiving hydrotherapy and will need skin graft in couple of days per ortho note.  Patient may need SNF, CSW aware.  08/24/13 15:41 Letha Cape RN, BSN [phone] patient s/p i and d of r leg on 12/9 in which vac had to be placed. Patient will need a repeat i and d or a vac change on Thursday per ortho or skin graft vs going home with vac. NCM will continue to follow for dc needs.  08/23/13 16:09 Letha Cape RN, BSN [phone] patient lives alone,  hospital follow up appt scheduled with [facility].  NCM will continue to folllow for dc needs.

## 2013-08-23 NOTE — Preoperative (Signed)
Beta Blockers   Reason not to administer Beta Blockers:Not Applicable 

## 2013-08-23 NOTE — Consult Note (Signed)
Reason for Consult:  Right leg infection with cellulitis and abscess Referring Physician:   [facility]  Lindsay [name] is an 62 y.o. female.  HPI:   62 yo female who was admitted thru the ED Sunday after noting an increasingly painful and growing "boil" on Lindsay right leg.  Some type of superficial "I&D" was performed by the ED staff (resident) on Sunday and she was admitted to the Hospitalist's service for IV antibiotics.  A gram stain did grow staph.  Ortho is consulted today sue to worsening drainage, pain, and a persistent high WBC.  She is on Cleocin and Daptomycin.  Past Medical History  Diagnosis Date  . Hypertension     Past Surgical History  Procedure Laterality Date  . Abdominal hysterectomy      Family History  Problem Relation Age of Onset  . CAD Mother     Social History:  reports that she has never smoked. She does not have any smokeless tobacco history on file. She reports that she does not drink alcohol or use illicit drugs.  Allergies: No Known Allergies  Medications: I have reviewed the patient's current medications.  Results for orders placed during the hospital encounter of 08/21/13 (from the past 48 hour(s))  BASIC METABOLIC PANEL     Status: Abnormal   Collection Time    08/21/13  2:00 PM      Result Value Range   Sodium 136  135 - 145 mEq/L   Potassium 4.3  3.5 - 5.1 mEq/L   Comment: HEMOLYSIS AT THIS LEVEL MAY AFFECT RESULT   Chloride 101  96 - 112 mEq/L   CO2 24  19 - 32 mEq/L   Glucose, Bld 91  70 - 99 mg/dL   BUN 13  6 - 23 mg/dL   Creatinine, Ser 3.08  0.50 - 1.10 mg/dL   Calcium 9.0  8.4 - 65.7 mg/dL   GFR calc non Af Amer 61 (*) >90 mL/min   GFR calc Af Amer 71 (*) >90 mL/min   Comment: (NOTE)     The eGFR has been calculated using the CKD EPI equation.     This calculation has not been validated in all clinical situations.     eGFR's persistently <90 mL/min signify possible Chronic Kidney     Disease.  CBC     Status: Abnormal   Collection Time    08/21/13  2:00 PM      Result Value Range   WBC 14.4 (*) 4.0 - 10.5 K/uL   RBC 4.81  3.87 - 5.11 MIL/uL   Hemoglobin 13.8  12.0 - 15.0 g/dL   HCT 84.6  96.2 - 95.2 %   MCV 85.2  78.0 - 100.0 fL   MCH 28.7  26.0 - 34.0 pg   MCHC 33.7  30.0 - 36.0 g/dL   RDW 84.1  32.4 - 40.1 %   Platelets 362  150 - 400 K/uL  WOUND CULTURE     Status: None   Collection Time    08/21/13  8:43 PM      Result Value Range   Specimen Description WOUND     Special Requests RIGHT LATERAL CALF PATIENT ON FOLLOWING VANCOMYCIN     Gram Stain       Value: NO WBC SEEN     NO SQUAMOUS EPITHELIAL CELLS SEEN     FEW GRAM POSITIVE COCCI IN PAIRS     Performed at [facility]  Value: MODERATE STAPHYLOCOCCUS AUREUS     Note: RIFAMPIN AND GENTAMICIN SHOULD NOT BE USED AS SINGLE DRUGS FOR TREATMENT OF STAPH INFECTIONS.     Performed at [facility]   Report Status PENDING    CBC WITH DIFFERENTIAL     Status: Abnormal   Collection Time    08/21/13 10:40 PM      Result Value Range   WBC 13.8 (*) 4.0 - 10.5 K/uL   RBC 4.58  3.87 - 5.11 MIL/uL   Hemoglobin 13.2  12.0 - 15.0 g/dL   HCT 40.9  81.1 - 91.4 %   MCV 84.3  78.0 - 100.0 fL   MCH 28.8  26.0 - 34.0 pg   MCHC 34.2  30.0 - 36.0 g/dL   RDW 78.2  95.6 - 21.3 %   Platelets 384  150 - 400 K/uL   Neutrophils Relative % 71  43 - 77 %   Neutro Abs 9.8 (*) 1.7 - 7.7 K/uL   Lymphocytes Relative 19  12 - 46 %   Lymphs Abs 2.6  0.7 - 4.0 K/uL   Monocytes Relative 10  3 - 12 %   Monocytes Absolute 1.4 (*) 0.1 - 1.0 K/uL   Eosinophils Relative 0  0 - 5 %   Eosinophils Absolute 0.0  0.0 - 0.7 K/uL   Basophils Relative 0  0 - 1 %   Basophils Absolute 0.0  0.0 - 0.1 K/uL  SEDIMENTATION RATE     Status: Abnormal   Collection Time    08/21/13 10:40 PM      Result Value Range   Sed Rate 45 (*) 0 - 22 mm/hr  COMPREHENSIVE METABOLIC PANEL     Status: Abnormal   Collection Time    08/22/13  7:10 AM      Result Value  Range   Sodium 137  135 - 145 mEq/L   Potassium 3.3 (*) 3.5 - 5.1 mEq/L   Comment: NO VISIBLE HEMOLYSIS   Chloride 103  96 - 112 mEq/L   CO2 25  19 - 32 mEq/L   Glucose, Bld 101 (*) 70 - 99 mg/dL   BUN 11  6 - 23 mg/dL   Creatinine, Ser 0.86  0.50 - 1.10 mg/dL   Calcium 8.9  8.4 - 57.8 mg/dL   Total Protein 7.6  6.0 - 8.3 g/dL   Albumin 3.0 (*) 3.5 - 5.2 g/dL   AST 11  0 - 37 U/L   ALT 11  0 - 35 U/L   Alkaline Phosphatase 69  39 - 117 U/L   Total Bilirubin 0.6  0.3 - 1.2 mg/dL   GFR calc non Af Amer 53 (*) >90 mL/min   GFR calc Af Amer 62 (*) >90 mL/min   Comment: (NOTE)     The eGFR has been calculated using the CKD EPI equation.     This calculation has not been validated in all clinical situations.     eGFR's persistently <90 mL/min signify possible Chronic Kidney     Disease.  CBC     Status: Abnormal   Collection Time    08/23/13  6:40 AM      Result Value Range   WBC 13.3 (*) 4.0 - 10.5 K/uL   RBC 4.52  3.87 - 5.11 MIL/uL   Hemoglobin 12.9  12.0 - 15.0 g/dL   HCT 46.9  62.9 - 52.8 %   MCV 85.8  78.0 - 100.0 fL   MCH 28.5  26.0 - 34.0 pg   MCHC 33.2  30.0 - 36.0 g/dL   RDW 47.8  29.5 - 62.1 %   Platelets 394  150 - 400 K/uL  BASIC METABOLIC PANEL     Status: Abnormal   Collection Time    08/23/13  6:40 AM      Result Value Range   Sodium 138  135 - 145 mEq/L   Potassium 3.6  3.5 - 5.1 mEq/L   Chloride 103  96 - 112 mEq/L   CO2 23  19 - 32 mEq/L   Glucose, Bld 106 (*) 70 - 99 mg/dL   BUN 19  6 - 23 mg/dL   Creatinine, Ser 3.08 (*) 0.50 - 1.10 mg/dL   Calcium 8.7  8.4 - 65.7 mg/dL   GFR calc non Af Amer 43 (*) >90 mL/min   GFR calc Af Amer 50 (*) >90 mL/min   Comment: (NOTE)     The eGFR has been calculated using the CKD EPI equation.     This calculation has not been validated in all clinical situations.     eGFR's persistently <90 mL/min signify possible Chronic Kidney     Disease.    Mr Tibia Fibula Right W Wo Contrast  08/22/2013   CLINICAL DATA:   Right lower leg cellulitis.  EXAM: MRI OF LOWER RIGHT EXTREMITY WITHOUT AND WITH CONTRAST  TECHNIQUE: Multiplanar, multisequence MR imaging of the lower right extremity was performed both before and after administration of intravenous contrast.  CONTRAST:  20mL MULTIHANCE GADOBENATE DIMEGLUMINE 529 MG/ML IV SOLN  COMPARISON:  None.  FINDINGS: There is focal subcutaneous edema and abnormal subcutaneous enhancement at the anterior medial aspect of the proximal right lower leg. The inflammation extends from approximately 4 cm below the knee joint to 26 cm below the knee joint.  The cellulitis is superficial and does not extend below the fascia. There is no muscle abnormality. The underlying tibia is normal. There is no abscess.  The patient does have an area of nodularity of the skin at the lateral aspect of the lower leg centered approximately 23 cm below the knee. This area of nodularity is approximately 4 cm in diameter. The skin is thickened laterally. Does the patient have a chronic skin condition at that site? There is no pathologic enhancement at that area after contrast administration.  IMPRESSION: 1. Superficial cellulitis of the anterior medial aspect of the right lower leg with no abscess or osteomyelitis or myositis or deep fasciitis. 2. Nodular lesion on the skin of the lateral aspect of the right lower leg as described.   Electronically Signed   By: Geanie Cooley M.D.   On: 08/22/2013 11:59    Review of Systems  All other systems reviewed and are negative.   Blood pressure 137/76, pulse 75, temperature 98.2 F (36.8 C), temperature source Axillary, resp. rate 18, height 5' 3.6" (1.615 m), weight 95.029 kg (209 lb 8 oz), SpO2 98.00%. Physical Exam  Musculoskeletal:       Legs:   Assessment/Plan: Right leg infection with cellulitis and gross purulence 1)  I spoke to Lindsay [name] in length and she understands that given Lindsay pain and Lindsay continued hight WBC as well as the purulence that this  warrants an aggressive irrigation and debridement in the OR today.  She should continue on IV antibiotics and will need further wound care post-op.  She may even need serial hydrotherapy.  I explained this to Lindsay and Lindsay [name] in great  detail and they fully understand the need to proceed to surgery.  Informed consent is obtained.  Bradey Luzier Y 08/23/2013, 11:43 AM

## 2013-08-23 NOTE — Transfer of Care (Signed)
Immediate Anesthesia Transfer of Care Note  Patient: Lindsay [name]  Procedure(s) Performed: Procedure(s): I&D Right Leg Abscess (Right) APPLICATION OF WOUND VAC (Right)  Patient Location: PACU  Anesthesia Type:General  Level of Consciousness: awake, alert , oriented and patient cooperative  Airway & Oxygen Therapy: Patient Spontanous Breathing and Patient connected to nasal cannula oxygen  Post-op Assessment: Report given to PACU RN, Post -op Vital signs reviewed and stable and Patient moving all extremities X 4  Post vital signs: Reviewed and stable  Complications: No apparent anesthesia complications

## 2013-08-23 NOTE — Progress Notes (Signed)
ANTIBIOTIC CONSULT NOTE  Pharmacy Consult for cubicin and clindamycin Indication: cellulitis  No Known Allergies  Patient Measurements: Height: 5' 3.6" (161.5 cm) Weight: 209 lb 8 oz (95.029 kg) IBW/kg (Calculated) : 53.78  Vital Signs: Temp: 98.2 F (36.8 C) (12/09 0508) Temp src: Axillary (12/09 0508) BP: 137/76 mmHg (12/09 0508) Pulse Rate: 75 (12/09 0508) Intake/Output from previous day: 12/08 0701 - 12/09 0700 In: 830 [P.O.:480; IV Piggyback:350] Out: -  Intake/Output from this shift: Total I/O In: 120 [P.O.:120] Out: -   Labs:  Recent Labs  08/21/13 1400 08/21/13 2240 08/22/13 0710 08/23/13 0640  WBC 14.4* 13.8*  --  13.3*  HGB 13.8 13.2  --  12.9  PLT 362 384  --  394  CREATININE 0.98  --  1.10 1.31*   Estimated Creatinine Clearance: 50.7 ml/min (by C-G formula based on Cr of 1.31). No results found for this basename: VANCOTROUGH, VANCOPEAK, VANCORANDOM, GENTTROUGH, GENTPEAK, GENTRANDOM, TOBRATROUGH, TOBRAPEAK, TOBRARND, AMIKACINPEAK, AMIKACINTROU, AMIKACIN,  in the last 72 hours   Microbiology: Recent Results (from the past 720 hour(s))  WOUND CULTURE     Status: None   Collection Time    08/21/13  8:43 PM      Result Value Range Status   Specimen Description WOUND   Final   Special Requests RIGHT LATERAL CALF PATIENT ON FOLLOWING VANCOMYCIN   Final   Gram Stain     Final   Value: NO WBC SEEN     NO SQUAMOUS EPITHELIAL CELLS SEEN     FEW GRAM POSITIVE COCCI IN PAIRS     Performed at [facility]   Culture     Final   Value: MODERATE STAPHYLOCOCCUS AUREUS     Note: RIFAMPIN AND GENTAMICIN SHOULD NOT BE USED AS SINGLE DRUGS FOR TREATMENT OF STAPH INFECTIONS.     Performed at [facility]   Report Status PENDING   Incomplete    Medical History: Past Medical History  Diagnosis Date  . Hypertension     Medications:  No prescriptions prior to admission   Scheduled:  . amLODipine  10 mg Oral Daily  . clindamycin (CLEOCIN) IV   600 mg Intravenous Q8H  . DAPTOmycin (CUBICIN)  IV  6 mg/kg Intravenous Q24H  . heparin subcutaneous  5,000 Units Subcutaneous Q8H  . potassium chloride  40 mEq Oral Once    Assessment: 62 yo female with cellulitis started on vancomycin and zosyn. She developed a fine rash after dose of vanc and does not appear to be improving on current antibiotics.  ID rec change to clindamycin and daptomycin.  Goal of Therapy:  Eradication of infection  Plan:  -Clindamycin 600 mg IV q8 hours -Daptomycin 6 mg/kg (570 mg) IV q24 hours -Will follow renal function, cultures and clinical progress  Talbert Cage, Pharm D 08/23/2013 10:27 AM

## 2013-08-23 NOTE — Anesthesia Postprocedure Evaluation (Signed)
  Anesthesia Post-op Note  Patient: Lindsay [name]  Procedure(s) Performed: Procedure(s): I&D Right Leg Abscess (Right) APPLICATION OF WOUND VAC (Right)  Patient Location: PACU  Anesthesia Type:General  Level of Consciousness: awake and alert   Airway and Oxygen Therapy: Patient Spontanous Breathing  Post-op Pain: moderate  Post-op Assessment: Post-op Vital signs reviewed, Patient's Cardiovascular Status Stable and Respiratory Function Stable  Post-op Vital Signs: Reviewed  Filed Vitals:   08/23/13 1915  BP: 187/86  Pulse: 103  Temp:   Resp: 25    Complications: No apparent anesthesia complications

## 2013-08-23 NOTE — Progress Notes (Addendum)
[facility] PROGRESS NOTE  Lindsay [name] ZOX:[id number] DOB: Nov 26, 1950 DOA: 08/21/2013 PCP: No PCP Per Patient   Interim Summary Patient is a pleasant 62 year old female with a past medical history of hypertension who was admitted to the medicine service on 08/21/2013, presenting with complaints of increased right leg swelling, pain that had been present for over a week. In the emergency department she underwent incision and drainage. She was started on empiric IV antibiotic therapy with vancomycin and Zosyn. She had an MRI of her right lower extremity performed on 08/22/2013 which did not show evidence of abscess, osteomyelitis, myositis or deep fasciitis. On 08/23/2013 patient's leg appeared worse with significant pain, erythema and purulence. I discussed case with Dr. Ninetta Lights of infectious disease who made the recommendation to discontinue vancomycin and Zosyn, starting Cubicin and clindamycin. Patient on the evening of 08/22/2013 developing a rash to vancomycin. Case was also discussed case with Dr. Simeon Craft of orthopedic surgery who evaluated patient on 08/23/2013 and recommended aggressive irrigation and debridement in the OR today.  Wound Cultures positive for Staph aureus.                                                                                 Assessment/Plan: 1. Cellulitis/abscess of right lower extremity. Patient presenting with clinical signs and symptoms consistent with cellulitis/abscess, undergoing incision and drainage in the emergency department. She was started on empiric IV antibiotic therapy with Zosyn and vancomycin. Wound cultures growing Staphylococcus aureus. Overnight she developed rash to vancomycin. I discussed case with Dr. Ninetta Lights of infectious disease who recommended starting Cubicin and Clindamycin. Although MRI of extremity performed on 08/22/2013 did not show evidence of abscess, osteomyelitis, myositis or deep fasciitis,  wound is showing purulence.  Orthopedic surgery consulted, plan for patient to undergo aggressive irrigation and debridement in the OR today. 2. Hypertension. Blood pressure is better controlled, continue amlodipine 10 mg by mouth daily. 3. Hypokalemia. A.m. results after the administration of oral potassium  4. Pain management. She is on morphine 1 mg IV every 4 hours as needed and Norco 5/325 one tablet every 6 hours as needed  Code Status: Full code Disposition Plan: Changing antibiotics today, discontinuing vancomycin and Zosyn starting Cubicin and clindamycin  Consults Telephone consultation made to infectious disease, Dr. Ninetta Lights Orthopedic surgery  Procedures:  Incision and drainage of abscess performed in the emergency department on 08/21/2013  Antibiotics:  Zosyn (started on 08/21/2013 discontinued on 08/23/2013)  Vancomycin (started on 08/21/2013 discontinued on 08/23/2013)  Cubicin (started on 08/23/2013)  Clindamycin (started on 08/23/2013)  HPI/Subjective: This morning patient reports severe pain to her left lower extremity, with possibly increasing erythema.  Objective: Filed Vitals:   08/23/13 0508  BP: 137/76  Pulse: 75  Temp: 98.2 F (36.8 C)  Resp: 18    Intake/Output Summary (Last 24 hours) at 08/23/13 1205 Last data filed at 08/23/13 0900  Gross per 24 hour  Intake    710 ml  Output      0 ml  Net    710 ml   Filed Weights   08/21/13 1702 08/21/13 2058  Weight: 94.348 kg (208 lb) 95.029 kg (209 lb 8 oz)    Exam:  General:  No acute distress she is awake alert and oriented x3  Cardiovascular: Regular rate rhythm normal S1 is  Respiratory: Lungs are clear to auscultation bilaterally no wheezing rhonchi or rales  Abdomen: Soft nontender nondistended  Musculoskeletal: Patient's leg appears worse and today, with purulent drainage over multiple areas in a 5 cm diameter region. There is increased erythema and significant pain to palpation over this region.   Data  Reviewed: Basic Metabolic Panel:  Recent Labs Lab 08/21/13 1400 08/22/13 0710 08/23/13 0640  NA 136 137 138  K 4.3 3.3* 3.6  CL 101 103 103  CO2 24 25 23   GLUCOSE 91 101* 106*  BUN 13 11 19   CREATININE 0.98 1.10 1.31*  CALCIUM 9.0 8.9 8.7   Liver Function Tests:  Recent Labs Lab 08/22/13 0710  AST 11  ALT 11  ALKPHOS 69  BILITOT 0.6  PROT 7.6  ALBUMIN 3.0*   No results found for this basename: LIPASE, AMYLASE,  in the last 168 hours No results found for this basename: AMMONIA,  in the last 168 hours CBC:  Recent Labs Lab 08/21/13 1400 08/21/13 2240 08/23/13 0640  WBC 14.4* 13.8* 13.3*  NEUTROABS  --  9.8*  --   HGB 13.8 13.2 12.9  HCT 41.0 38.6 38.8  MCV 85.2 84.3 85.8  PLT 362 384 394   Cardiac Enzymes: No results found for this basename: CKTOTAL, CKMB, CKMBINDEX, TROPONINI,  in the last 168 hours BNP (last 3 results) No results found for this basename: PROBNP,  in the last 8760 hours CBG: No results found for this basename: GLUCAP,  in the last 168 hours  Recent Results (from the past 240 hour(s))  WOUND CULTURE     Status: None   Collection Time    08/21/13  8:43 PM      Result Value Range Status   Specimen Description WOUND   Final   Special Requests RIGHT LATERAL CALF PATIENT ON FOLLOWING VANCOMYCIN   Final   Gram Stain     Final   Value: NO WBC SEEN     NO SQUAMOUS EPITHELIAL CELLS SEEN     FEW GRAM POSITIVE COCCI IN PAIRS     Performed at [facility]   Culture     Final   Value: MODERATE STAPHYLOCOCCUS AUREUS     Note: RIFAMPIN AND GENTAMICIN SHOULD NOT BE USED AS SINGLE DRUGS FOR TREATMENT OF STAPH INFECTIONS.     Performed at [facility]   Report Status PENDING   Incomplete     Studies: Mr Tibia Fibula Right W Wo Contrast  08/22/2013   CLINICAL DATA:  Right lower leg cellulitis.  EXAM: MRI OF LOWER RIGHT EXTREMITY WITHOUT AND WITH CONTRAST  TECHNIQUE: Multiplanar, multisequence MR imaging of the lower right extremity  was performed both before and after administration of intravenous contrast.  CONTRAST:  20mL MULTIHANCE GADOBENATE DIMEGLUMINE 529 MG/ML IV SOLN  COMPARISON:  None.  FINDINGS: There is focal subcutaneous edema and abnormal subcutaneous enhancement at the anterior medial aspect of the proximal right lower leg. The inflammation extends from approximately 4 cm below the knee joint to 26 cm below the knee joint.  The cellulitis is superficial and does not extend below the fascia. There is no muscle abnormality. The underlying tibia is normal. There is no abscess.  The patient does have an area of nodularity of the skin at the lateral aspect of the lower leg centered approximately 23 cm below the knee. This  area of nodularity is approximately 4 cm in diameter. The skin is thickened laterally. Does the patient have a chronic skin condition at that site? There is no pathologic enhancement at that area after contrast administration.  IMPRESSION: 1. Superficial cellulitis of the anterior medial aspect of the right lower leg with no abscess or osteomyelitis or myositis or deep fasciitis. 2. Nodular lesion on the skin of the lateral aspect of the right lower leg as described.   Electronically Signed   By: Geanie Cooley M.D.   On: 08/22/2013 11:59    Scheduled Meds: . amLODipine  10 mg Oral Daily  . clindamycin (CLEOCIN) IV  600 mg Intravenous Q8H  . DAPTOmycin (CUBICIN)  IV  6 mg/kg Intravenous Q24H  . heparin subcutaneous  5,000 Units Subcutaneous Q8H  . potassium chloride  40 mEq Oral Once   Continuous Infusions: . sodium chloride 20 mL/hr (08/21/13 2229)    Principal Problem:   Cellulitis Active Problems:   OBSTRUCTIVE SLEEP APNEA   HTN (hypertension)    Time spent: 35 min    Lindsay [name]  [facility] Pager [phone]. If 7PM-7AM, please contact night-coverage at [email or web address], password [facility] 08/23/2013, 12:05 PM  LOS: 2 days

## 2013-08-23 NOTE — Progress Notes (Deleted)
Pt back from PACU  Filed Vitals:   08/23/13 1949  BP: 175/99  Pulse: 105  Temp: 98.7 F (37.1 C)  Resp: 20

## 2013-08-24 LAB — WOUND CULTURE: Gram Stain: NONE SEEN

## 2013-08-24 LAB — CBC
HCT: 37.9 % (ref 36.0–46.0)
Hemoglobin: 12.4 g/dL (ref 12.0–15.0)
MCH: 28.2 pg (ref 26.0–34.0)
MCHC: 32.7 g/dL (ref 30.0–36.0)
MCV: 86.3 fL (ref 78.0–100.0)
Platelets: 401 10*3/uL — ABNORMAL HIGH (ref 150–400)
RBC: 4.39 MIL/uL (ref 3.87–5.11)
RDW: 14.3 % (ref 11.5–15.5)
WBC: 17.5 10*3/uL — ABNORMAL HIGH (ref 4.0–10.5)

## 2013-08-24 MED ORDER — CEFAZOLIN SODIUM-DEXTROSE 2-3 GM-% IV SOLR
2.0000 g | Freq: Three times a day (TID) | INTRAVENOUS | Status: DC
  Administered 2013-08-24 – 2013-08-27 (×9): 2 g via INTRAVENOUS
  Filled 2013-08-24 (×13): qty 50

## 2013-08-24 NOTE — Progress Notes (Signed)
Pt trans from the PACU. Wound Vac to left lower leg.

## 2013-08-24 NOTE — Progress Notes (Signed)
[facility] PROGRESS NOTE  Karem Farha ZOX:[id number] DOB: 1951-03-05 DOA: 08/21/2013 PCP: No PCP Per Patient  HPI/Subjective: S/p debridement and placement of wound VAC.  She might need skin graft per ortho. Ortho please advise if PRS consult needs to be during this hospital stay.  Interim Summary Patient is a pleasant 62 year old female with a past medical history of hypertension who was admitted to the medicine service on 08/21/2013, presenting with complaints of increased right leg swelling, pain that had been present for over a week. In the emergency department she underwent incision and drainage. She was started on empiric IV antibiotic therapy with vancomycin and Zosyn. She had an MRI of her right lower extremity performed on 08/22/2013 which did not show evidence of abscess, osteomyelitis, myositis or deep fasciitis. On 08/23/2013 patient's leg appeared worse with significant pain, erythema and purulence. I discussed case with Dr. Ninetta Lights of infectious disease who made the recommendation to discontinue vancomycin and Zosyn, starting Cubicin and clindamycin. Patient on the evening of 08/22/2013 developing a rash to vancomycin. Case was also discussed case with Dr. Simeon Craft of orthopedic surgery who evaluated patient on 08/23/2013 and recommended aggressive irrigation and debridement in the OR today.  Wound Cultures positive for Staph aureus.                                                                                Assessment/Plan:  Cellulitis/abscess of right lower extremity Patient presenting with clinical signs and symptoms consistent with cellulitis/abscess. Undergone incision and drainage in the emergency department. She was started on empiric IV antibiotic therapy with Zosyn and vancomycin. Wound cultures growing Staphylococcus aureus. Overnight she developed rash to vancomycin.  Was on Cubicin and Clindamycin, culture growing MSSA, will switch to Ancef  Hypertension   Blood pressure is better controlled, continue amlodipine 10 mg by mouth daily.  Hypokalemia A.m. results after the administration of oral potassium   Pain management She is on morphine 1 mg IV every 4 hours as needed and Norco 5/325 one tablet every 6 hours as needed  Code Status: Full code Disposition Plan: Changing antibiotics today, discontinuing vancomycin and Zosyn starting Cubicin and clindamycin  Consults Orthopedic surgery  Procedures:  Incision and drainage of abscess performed in the emergency department on 08/21/2013  Antibiotics:  Zosyn (started on 08/21/2013 discontinued on 08/23/2013)  Vancomycin (started on 08/21/2013 discontinued on 08/23/2013)  Cubicin (started on 08/23/2013) D/C on 12/10  Clindamycin (started on 08/23/2013) D/C on 12/10  Ancef started on 12/10  Objective: Filed Vitals:   08/24/13 0452  BP: 117/71  Pulse: 89  Temp: 98.6 F (37 C)  Resp: 18    Intake/Output Summary (Last 24 hours) at 08/24/13 1041 Last data filed at 08/24/13 0551  Gross per 24 hour  Intake    700 ml  Output     25 ml  Net    675 ml   Filed Weights   08/21/13 1702 08/21/13 2058  Weight: 94.348 kg (208 lb) 95.029 kg (209 lb 8 oz)    Exam:   General:  No acute distress she is awake alert and oriented x3  Cardiovascular: Regular rate rhythm normal S1 is  Respiratory: Lungs are clear  to auscultation bilaterally no wheezing rhonchi or rales  Abdomen: Soft nontender nondistended  Musculoskeletal: Patient's leg appears worse and today, with purulent drainage over multiple areas in a 5 cm diameter region. There is increased erythema and significant pain to palpation over this region.   Data Reviewed: Basic Metabolic Panel:  Recent Labs Lab 08/21/13 1400 08/22/13 0710 08/23/13 0640  NA 136 137 138  K 4.3 3.3* 3.6  CL 101 103 103  CO2 24 25 23   GLUCOSE 91 101* 106*  BUN 13 11 19   CREATININE 0.98 1.10 1.31*  CALCIUM 9.0 8.9 8.7   Liver Function  Tests:  Recent Labs Lab 08/22/13 0710  AST 11  ALT 11  ALKPHOS 69  BILITOT 0.6  PROT 7.6  ALBUMIN 3.0*   No results found for this basename: LIPASE, AMYLASE,  in the last 168 hours No results found for this basename: AMMONIA,  in the last 168 hours CBC:  Recent Labs Lab 08/21/13 1400 08/21/13 2240 08/23/13 0640 08/24/13 0651  WBC 14.4* 13.8* 13.3* 17.5*  NEUTROABS  --  9.8*  --   --   HGB 13.8 13.2 12.9 12.4  HCT 41.0 38.6 38.8 37.9  MCV 85.2 84.3 85.8 86.3  PLT 362 384 394 401*   Cardiac Enzymes: No results found for this basename: CKTOTAL, CKMB, CKMBINDEX, TROPONINI,  in the last 168 hours BNP (last 3 results) No results found for this basename: PROBNP,  in the last 8760 hours CBG: No results found for this basename: GLUCAP,  in the last 168 hours  Recent Results (from the past 240 hour(s))  WOUND CULTURE     Status: None   Collection Time    08/21/13  8:43 PM      Result Value Range Status   Specimen Description WOUND   Final   Special Requests RIGHT LATERAL CALF PATIENT ON FOLLOWING VANCOMYCIN   Final   Gram Stain     Final   Value: NO WBC SEEN     NO SQUAMOUS EPITHELIAL CELLS SEEN     FEW GRAM POSITIVE COCCI IN PAIRS     Performed at [facility]   Culture     Final   Value: MODERATE STAPHYLOCOCCUS AUREUS     Note: RIFAMPIN AND GENTAMICIN SHOULD NOT BE USED AS SINGLE DRUGS FOR TREATMENT OF STAPH INFECTIONS. This organism DOES NOT demonstrate inducible Clindamycin resistance in vitro.     Performed at [facility]   Report Status 08/24/2013 FINAL   Final   Organism ID, Bacteria STAPHYLOCOCCUS AUREUS   Final  SURGICAL PCR SCREEN     Status: None   Collection Time    08/23/13  4:15 PM      Result Value Range Status   MRSA, PCR NEGATIVE  NEGATIVE Final   Staphylococcus aureus NEGATIVE  NEGATIVE Final   Comment:            The Xpert SA Assay (FDA     approved for NASAL specimens     in patients over 20 years of age),     is one component  of     a comprehensive surveillance     program.  Test performance has     been validated by [employer] for patients greater     than or equal to 44 year old.     It is not intended     to diagnose infection nor to     guide or  monitor treatment.     Studies: Mr Tibia Fibula Right W Wo Contrast  08/22/2013   CLINICAL DATA:  Right lower leg cellulitis.  EXAM: MRI OF LOWER RIGHT EXTREMITY WITHOUT AND WITH CONTRAST  TECHNIQUE: Multiplanar, multisequence MR imaging of the lower right extremity was performed both before and after administration of intravenous contrast.  CONTRAST:  20mL MULTIHANCE GADOBENATE DIMEGLUMINE 529 MG/ML IV SOLN  COMPARISON:  None.  FINDINGS: There is focal subcutaneous edema and abnormal subcutaneous enhancement at the anterior medial aspect of the proximal right lower leg. The inflammation extends from approximately 4 cm below the knee joint to 26 cm below the knee joint.  The cellulitis is superficial and does not extend below the fascia. There is no muscle abnormality. The underlying tibia is normal. There is no abscess.  The patient does have an area of nodularity of the skin at the lateral aspect of the lower leg centered approximately 23 cm below the knee. This area of nodularity is approximately 4 cm in diameter. The skin is thickened laterally. Does the patient have a chronic skin condition at that site? There is no pathologic enhancement at that area after contrast administration.  IMPRESSION: 1. Superficial cellulitis of the anterior medial aspect of the right lower leg with no abscess or osteomyelitis or myositis or deep fasciitis. 2. Nodular lesion on the skin of the lateral aspect of the right lower leg as described.   Electronically Signed   By: Geanie Cooley M.D.   On: 08/22/2013 11:59    Scheduled Meds: . amLODipine  10 mg Oral Daily  . clindamycin (CLEOCIN) IV  600 mg Intravenous Q8H  . DAPTOmycin (CUBICIN)  IV  6 mg/kg Intravenous Q24H  . heparin  subcutaneous  5,000 Units Subcutaneous Q8H   Continuous Infusions: . sodium chloride 20 mL/hr (08/21/13 2229)  . lactated ringers      Principal Problem:   Cellulitis Active Problems:   OBSTRUCTIVE SLEEP APNEA   HTN (hypertension)    Time spent: 35 min    [facility]  [facility] Pager [phone]. If 7PM-7AM, please contact night-coverage at [email or web address], password [email or web address] 08/24/2013, 10:41 AM  LOS: 3 days

## 2013-08-24 NOTE — Progress Notes (Signed)
Patient ID: Lindsay [name], female   DOB: 08-26-1951, 62 y.o.   MRN: [id number] Took Lindsay [name] to OR late yesterday for an I&D of her right leg wound.  She has a wound that I had to place a VAC over due to her necrosed skin.  This will need further wound care.  She will need a repeat I&D or at least a VAC change late Thursday or more likely Friday.  She may even need skin grafting vs eventually going home with a home VAC and close follow-up for wound care.  Continue IV antibiotics for now until WBC improves.  I'll continue to follow closely and decide when next VAC change is warranted.

## 2013-08-24 NOTE — Op Note (Signed)
Lindsay Lindsay [name], Lindsay [name] NO.:  [id number]  MEDICAL RECORD NO.:  [id number]  LOCATION:  [id number]                        FACILITY:  [facility]  PHYSICIAN:  Vanita Panda. Magnus Ivan, M.D.DATE OF BIRTH:  03-29-1951  DATE OF PROCEDURE:  08/23/2013 DATE OF DISCHARGE:                              OPERATIVE REPORT   PREOPERATIVE DIAGNOSIS:  Right leg soft tissue wound with cellulitis and abscess.  Right tibia wound measuring 5 cm x 7 cm with  gross purulence.  PROCEDURE: 1. Irrigation and debridement of superficial and deep tissues, right     distal tibia with debridement of skin and fascia only. 2. Placement of decubital VAC sponge on the right tibia wound.  SURGEON:  Doneen Poisson, MD.  ASSISTING:  Lenon Curt [facility].  ANESTHESIA:  General.  ESTIMATED BLOOD LOSS:  Less than 100 mL.  COMPLICATIONS:  None.  INDICATIONS:  Lindsay Lindsay [name] is a 62 year old female who developed some type of bow on her anterolateral shin last week.  She was treated with Epsom salts and she got angry.  She went to the emergency room on Sunday and irrigation and debridement was carried out at the bedside with basically just open the wound apparently by an ER resident.  There was no good description of the wound and she was admitted to the Medicine Service and started on IV antibiotics.  Her white count has been persistently high and today and she need some gross purulence around areas of her skin and Orthopedic Surgery was consulted.  I recognized the wound and the gross purulence.  An MRI was obtained did not show any abscess, but definitely it was worrisome just clinically on how she looks.  I talked to her family and they did understand that we need to proceed to the operating room for wide irrigation and debridement.  The risks and benefits of this were explained to her in detail and she did understand the need to proceed with surgery.  Of note, the skin area is wide and is very rough and I  told the family that would not be able to cover this.  PROCEDURE DESCRIPTION:  After informed consent was obtained, appropriate right leg was marked.  She was brought to the operating placed on the operating table.  General anesthesia was then obtained.  A nonsterile tourniquet placed around her upper right thigh and her right leg was prepped and draped with Betadine scrub and paint.  A time-out was called to identify the correct patient, correct right leg.  We then had a tourniquet inflated to 300 mm of pressure.  I made an incision along this  wound and found areas with both purulence and necrotic fascia and necrotic skin which had excised sharply.  I then did entered the anterior and lateral compartments and did not find any gross purulence along the fascia planes at all.  We then used pulsatile lavage 3 L normal saline solution and fully lavaged the area.  I then reapproximated the skin edges that I made myself  with 2-0 nylon suture, but had to place a medium Hemovac drain over the wound itself to allow for wound healing.  I felt comfortable doing this, in spite of  the purulence because the purulence was not extensive.  She was then awakened, extubated, taken to recovery room in stable condition.  All final counts are correct.  There were no complications noted. Postoperatively, I feel like she needs to be here for continued antibiotics, we see her white blood cell count trending down.  The main issue now is going to be her soft tissue in the coverage, she may need in terms of some type of a skin grafting due to this wound.     Vanita Panda. Magnus Ivan, M.D.     CYB/MEDQ  D:  08/23/2013  T:  08/24/2013  Job:  [id number]

## 2013-08-24 NOTE — Progress Notes (Signed)
ANTIBIOTIC CONSULT NOTE  Pharmacy Consult for cefazolin Indication: cellulitis, MSSA  Allergies  Allergen Reactions  . Vancomycin Rash    Patient Measurements: Height: 5' 3.6" (161.5 cm) Weight: 209 lb 8 oz (95.029 kg) IBW/kg (Calculated) : 53.78  Vital Signs: Temp: 98.6 F (37 C) (12/10 0452) Temp src: Oral (12/10 0452) BP: 117/71 mmHg (12/10 0452) Pulse Rate: 89 (12/10 0452) Intake/Output from previous day: 12/09 0701 - 12/10 0700 In: 820 [P.O.:120; I.V.:700] Out: 25 [Drains:25] Intake/Output from this shift:    Labs:  Recent Labs  08/21/13 1400 08/21/13 2240 08/22/13 0710 08/23/13 0640 08/24/13 0651  WBC 14.4* 13.8*  --  13.3* 17.5*  HGB 13.8 13.2  --  12.9 12.4  PLT 362 384  --  394 401*  CREATININE 0.98  --  1.10 1.31*  --    Estimated Creatinine Clearance: 50.7 ml/min (by C-G formula based on Cr of 1.31). No results found for this basename: VANCOTROUGH, VANCOPEAK, VANCORANDOM, GENTTROUGH, GENTPEAK, GENTRANDOM, TOBRATROUGH, TOBRAPEAK, TOBRARND, AMIKACINPEAK, AMIKACINTROU, AMIKACIN,  in the last 72 hours   Microbiology: Recent Results (from the past 720 hour(s))  WOUND CULTURE     Status: None   Collection Time    08/21/13  8:43 PM      Result Value Range Status   Specimen Description WOUND   Final   Special Requests RIGHT LATERAL CALF PATIENT ON FOLLOWING VANCOMYCIN   Final   Gram Stain     Final   Value: NO WBC SEEN     NO SQUAMOUS EPITHELIAL CELLS SEEN     FEW GRAM POSITIVE COCCI IN PAIRS     Performed at [facility]   Culture     Final   Value: MODERATE STAPHYLOCOCCUS AUREUS     Note: RIFAMPIN AND GENTAMICIN SHOULD NOT BE USED AS SINGLE DRUGS FOR TREATMENT OF STAPH INFECTIONS. This organism DOES NOT demonstrate inducible Clindamycin resistance in vitro.     Performed at [facility]   Report Status 08/24/2013 FINAL   Final   Organism ID, Bacteria STAPHYLOCOCCUS AUREUS   Final  SURGICAL PCR SCREEN     Status: None   Collection  Time    08/23/13  4:15 PM      Result Value Range Status   MRSA, PCR NEGATIVE  NEGATIVE Final   Staphylococcus aureus NEGATIVE  NEGATIVE Final   Comment:            The Xpert SA Assay (FDA     approved for NASAL specimens     in patients over 31 years of age),     is one component of     a comprehensive surveillance     program.  Test performance has     been validated by [employer] for patients greater     than or equal to 82 year old.     It is not intended     to diagnose infection nor to     guide or monitor treatment.    Medical History: Past Medical History  Diagnosis Date  . Hypertension     Medications:  No prescriptions prior to admission   Scheduled:  . amLODipine  10 mg Oral Daily  .  ceFAZolin (ANCEF) IV  2 g Intravenous Q8H  . heparin subcutaneous  5,000 Units Subcutaneous Q8H    Assessment: 62 yo female with cellulitis started on vancomycin and zosyn. She developed a fine rash after dose of vanc and does not  appear to be improving on current antibiotics.  ID rec change to clindamycin and daptomycin.  Culture MSSA, will change Abx to ancef  Goal of Therapy:  Eradication of infection  Plan:  -Ancef 2gm IV q8 hours -Will follow renal function, cultures and clinical progress  Talbert Cage, Pharm D 08/24/2013 11:02 AM

## 2013-08-25 ENCOUNTER — Encounter (HOSPITAL_COMMUNITY): Payer: Self-pay | Admitting: Orthopaedic Surgery

## 2013-08-25 LAB — BASIC METABOLIC PANEL
BUN: 15 mg/dL (ref 6–23)
CO2: 23 mEq/L (ref 19–32)
Calcium: 8.8 mg/dL (ref 8.4–10.5)
Chloride: 103 mEq/L (ref 96–112)
Creatinine, Ser: 1.03 mg/dL (ref 0.50–1.10)
GFR calc Af Amer: 67 mL/min — ABNORMAL LOW (ref >90)
GFR calc non Af Amer: 58 mL/min — ABNORMAL LOW (ref >90)
Glucose, Bld: 109 mg/dL — ABNORMAL HIGH (ref 70–99)
Potassium: 3.6 mEq/L (ref 3.5–5.1)
Sodium: 137 mEq/L (ref 135–145)

## 2013-08-25 LAB — CBC
HCT: 37.1 % (ref 36.0–46.0)
Hemoglobin: 12.3 g/dL (ref 12.0–15.0)
MCH: 28.6 pg (ref 26.0–34.0)
MCHC: 33.2 g/dL (ref 30.0–36.0)
MCV: 86.3 fL (ref 78.0–100.0)
Platelets: 416 10*3/uL — ABNORMAL HIGH (ref 150–400)
RBC: 4.3 MIL/uL (ref 3.87–5.11)
RDW: 14.4 % (ref 11.5–15.5)
WBC: 15.9 10*3/uL — ABNORMAL HIGH (ref 4.0–10.5)

## 2013-08-25 MED ORDER — OXYCODONE HCL 5 MG PO TABS
5.0000 mg | ORAL_TABLET | ORAL | Status: DC | PRN
  Administered 2013-08-25: 10 mg via ORAL
  Administered 2013-08-25 (×2): 5 mg via ORAL
  Administered 2013-08-26 – 2013-08-30 (×13): 10 mg via ORAL
  Administered 2013-08-31: 5 mg via ORAL
  Administered 2013-09-01: 10 mg via ORAL
  Administered 2013-09-01: 5 mg via ORAL
  Filled 2013-08-25 (×10): qty 2
  Filled 2013-08-25: qty 1
  Filled 2013-08-25 (×4): qty 2
  Filled 2013-08-25: qty 1
  Filled 2013-08-25: qty 2

## 2013-08-25 MED ORDER — HYDROMORPHONE HCL PF 1 MG/ML IJ SOLN
1.0000 mg | INTRAMUSCULAR | Status: DC | PRN
  Administered 2013-08-26 – 2013-08-29 (×2): 1 mg via INTRAVENOUS
  Filled 2013-08-25 (×2): qty 1

## 2013-08-25 NOTE — Progress Notes (Signed)
Patient ID: Lindsay [name], female   DOB: [birth year], 62 y.o.   MRN: [id number] WBC down only a little.  VAC removed.  Wound shows good granulation tissue.  Will need skin graft in the next few days.  Will order hydrotherapy for today at the bedside if she can tolerate it with pain meds.

## 2013-08-25 NOTE — Progress Notes (Signed)
[facility] PROGRESS NOTE  Evah Rashid WUJ:[id number] DOB: [birth year] DOA: 08/21/2013 PCP: No PCP Per Patient  HPI/Subjective: S/p debridement and placement of wound VAC, VAC removed.  Antibiotic switched to Ancef, WBC is trending down.  Interim Summary Patient is a pleasant 62 year old female with a past medical history of hypertension who was admitted to the medicine service on 08/21/2013, presenting with complaints of increased right leg swelling, pain that had been present for over a week. In the emergency department she underwent incision and drainage. She was started on empiric IV antibiotic therapy with vancomycin and Zosyn. She had an MRI of her right lower extremity performed on 08/22/2013 which did not show evidence of abscess, osteomyelitis, myositis or deep fasciitis. On 08/23/2013 patient's leg appeared worse with significant pain, erythema and purulence. I discussed case with Dr. Ninetta Lights of infectious disease who made the recommendation to discontinue vancomycin and Zosyn, starting Cubicin and clindamycin. Patient on the evening of 08/22/2013 developing a rash to vancomycin. Case was also discussed case with Dr. Simeon Craft of orthopedic surgery who evaluated patient on 08/23/2013 and recommended aggressive irrigation and debridement in the OR today.  Wound Cultures positive for Staph aureus.                                                                                Assessment/Plan:  Cellulitis/abscess of right lower extremity Patient presenting with clinical signs and symptoms consistent with cellulitis/abscess. Undergone incision and drainage in the emergency department. She was started on empiric IV antibiotic therapy with Zosyn and vancomycin.  Overnight she developed rash to vancomycin.  Was on Cubicin and Clindamycin, culture growing MSSA, will switch to Ancef. Start hydrotherapy, likely to have a skin graft when the infection is controlled.  Hypertension  Blood  pressure is better controlled, continue amlodipine 10 mg by mouth daily.  Hypokalemia A.m. results after the administration of oral potassium   Pain management She is on morphine 1 mg IV every 4 hours as needed and Norco 5/325 one tablet every 6 hours as needed  Code Status: Full code Disposition Plan: Changing antibiotics today, discontinuing vancomycin and Zosyn starting Cubicin and clindamycin  Consults Orthopedic surgery  Procedures:  Incision and drainage of abscess performed in the emergency department on 08/21/2013  Antibiotics:  Zosyn (started on 08/21/2013 discontinued on 08/23/2013)  Vancomycin (started on 08/21/2013 discontinued on 08/23/2013)  Cubicin (started on 08/23/2013) D/C on 12/10  Clindamycin (started on 08/23/2013) D/C on 12/10  Ancef started on 12/10  Objective: Filed Vitals:   08/25/13 0948  BP: 132/76  Pulse:   Temp:   Resp:     Intake/Output Summary (Last 24 hours) at 08/25/13 1345 Last data filed at 08/25/13 0900  Gross per 24 hour  Intake    240 ml  Output     50 ml  Net    190 ml   Filed Weights   08/21/13 1702 08/21/13 2058  Weight: 94.348 kg (208 lb) 95.029 kg (209 lb 8 oz)    Exam:   General:  No acute distress she is awake alert and oriented x3  Cardiovascular: Regular rate rhythm normal S1 is  Respiratory: Lungs are clear to auscultation bilaterally no  wheezing rhonchi or rales  Abdomen: Soft nontender nondistended  Musculoskeletal: Patient's leg appears worse and today, with purulent drainage over multiple areas in a 5 cm diameter region. There is increased erythema and significant pain to palpation over this region.   Data Reviewed: Basic Metabolic Panel:  Recent Labs Lab 08/21/13 1400 08/22/13 0710 08/23/13 0640 08/25/13 0650  NA 136 137 138 137  K 4.3 3.3* 3.6 3.6  CL 101 103 103 103  CO2 24 25 23 23   GLUCOSE 91 101* 106* 109*  BUN 13 11 19 15   CREATININE 0.98 1.10 1.31* 1.03  CALCIUM 9.0 8.9 8.7 8.8    Liver Function Tests:  Recent Labs Lab 08/22/13 0710  AST 11  ALT 11  ALKPHOS 69  BILITOT 0.6  PROT 7.6  ALBUMIN 3.0*   No results found for this basename: LIPASE, AMYLASE,  in the last 168 hours No results found for this basename: AMMONIA,  in the last 168 hours CBC:  Recent Labs Lab 08/21/13 1400 08/21/13 2240 08/23/13 0640 08/24/13 0651 08/25/13 0650  WBC 14.4* 13.8* 13.3* 17.5* 15.9*  NEUTROABS  --  9.8*  --   --   --   HGB 13.8 13.2 12.9 12.4 12.3  HCT 41.0 38.6 38.8 37.9 37.1  MCV 85.2 84.3 85.8 86.3 86.3  PLT 362 384 394 401* 416*   Cardiac Enzymes: No results found for this basename: CKTOTAL, CKMB, CKMBINDEX, TROPONINI,  in the last 168 hours BNP (last 3 results) No results found for this basename: PROBNP,  in the last 8760 hours CBG: No results found for this basename: GLUCAP,  in the last 168 hours  Recent Results (from the past 240 hour(s))  WOUND CULTURE     Status: None   Collection Time    08/21/13  8:43 PM      Result Value Range Status   Specimen Description WOUND   Final   Special Requests RIGHT LATERAL CALF PATIENT ON FOLLOWING VANCOMYCIN   Final   Gram Stain     Final   Value: NO WBC SEEN     NO SQUAMOUS EPITHELIAL CELLS SEEN     FEW GRAM POSITIVE COCCI IN PAIRS     Performed at [facility]   Culture     Final   Value: MODERATE STAPHYLOCOCCUS AUREUS     Note: RIFAMPIN AND GENTAMICIN SHOULD NOT BE USED AS SINGLE DRUGS FOR TREATMENT OF STAPH INFECTIONS. This organism DOES NOT demonstrate inducible Clindamycin resistance in vitro.     Performed at [facility]   Report Status 08/24/2013 FINAL   Final   Organism ID, Bacteria STAPHYLOCOCCUS AUREUS   Final  SURGICAL PCR SCREEN     Status: None   Collection Time    08/23/13  4:15 PM      Result Value Range Status   MRSA, PCR NEGATIVE  NEGATIVE Final   Staphylococcus aureus NEGATIVE  NEGATIVE Final   Comment:            The Xpert SA Assay (FDA     approved for NASAL  specimens     in patients over 94 years of age),     is one component of     a comprehensive surveillance     program.  Test performance has     been validated by [employer] for patients greater     than or equal to 61 year old.     It is not  intended     to diagnose infection nor to     guide or monitor treatment.     Studies: No results found.  Scheduled Meds: . amLODipine  10 mg Oral Daily  .  ceFAZolin (ANCEF) IV  2 g Intravenous Q8H  . heparin subcutaneous  5,000 Units Subcutaneous Q8H   Continuous Infusions: . sodium chloride 20 mL (08/25/13 1036)  . lactated ringers      Principal Problem:   Cellulitis Active Problems:   OBSTRUCTIVE SLEEP APNEA   HTN (hypertension)    Time spent: 35 min    [facility]  [facility] Pager [phone]. If 7PM-7AM, please contact night-coverage at [email or web address], password [facility] 08/25/2013, 1:45 PM  LOS: 4 days

## 2013-08-25 NOTE — Progress Notes (Signed)
Physical Therapy Wound Treatment Patient Details  Name: Lindsay [name] MRN: [id number] Date of Birth: 1950-12-28  Today's Date: 08/25/2013 Time: 7829-5621 Time Calculation (min): 25 min  Subjective  Subjective: Pt states she doesn't want to look at it. Patient and Family Stated Goals: Get wound better Date of Onset: 08/18/13  Pain Score: Pain Score: 3   Wound Assessment  Wound 08/21/13 Other (Comment) Leg Right;Lateral  5 cm diameter open blister (Active)  Site / Wound Assessment Red;Yellow;Painful 08/25/2013 12:18 PM  % Wound base Red or Granulating 90% 08/25/2013 12:18 PM  % Wound base Yellow 10% 08/25/2013 12:18 PM  Peri-wound Assessment Edema;Erythema (blanchable) 08/25/2013 12:18 PM  Wound Length (cm) 7 cm 08/25/2013 12:18 PM  Wound Width (cm) 8 cm 08/25/2013 12:18 PM  Wound Depth (cm) 0.5 cm 08/25/2013 12:18 PM  Drainage Amount Moderate 08/25/2013 12:18 PM  Drainage Description Serosanguineous 08/25/2013 12:18 PM  Treatment Hydrotherapy (Pulse lavage) 08/25/2013 12:18 PM  Dressing Type ABD;Impregnated gauze (bismuth);Gauze (Comment);Compression wrap 08/25/2013 12:18 PM  Dressing Changed Changed 08/25/2013 12:18 PM  Dressing Status Clean 08/25/2013 12:18 PM      Hydrotherapy Pulsed lavage therapy - wound location: Rt anterolateral tibial surface Pulsed Lavage with Suction (psi): 4 psi Pulsed Lavage with Suction - Normal Saline Used: Other (comment) (700) Pulsed Lavage Tip: Tip with splash shield   Wound Assessment and Plan  Wound Therapy - Assess/Plan/Recommendations Wound Therapy - Clinical Statement: Pt presents to hydrotherapy with open rt tibial wound s/p I & D.  Can benefit from hydrotherapy to decrease necrotic tissue and promote wound healing. Wound Therapy - Functional Problem List: Decr mobility due to painful leg wound. Factors Delaying/Impairing Wound Healing: Infection - systemic/local Hydrotherapy Plan: Debridement;Dressing change;Patient/family  education;Pulsatile lavage with suction Wound Therapy - Frequency: 6X / week Wound Therapy - Current Recommendations: PT (for mobility) Wound Therapy - Follow Up Recommendations: Home health RN Wound Plan: See above.  Wound Therapy Goals- Improve the function of patient's integumentary system by progressing the wound(s) through the phases of wound healing (inflammation - proliferation - remodeling) by: Decrease Necrotic Tissue to: 0 Decrease Necrotic Tissue - Progress: Goal set today Increase Granulation Tissue to: 100 Time For Goal Achievement: 7 days Wound Therapy - Potential for Goals: Good  Goals will be updated until maximal potential achieved or discharge criteria met.  Discharge criteria: when goals achieved, discharge from hospital, MD decision/surgical intervention, no progress towards goals, refusal/missing three consecutive treatments without notification or medical reason.  GP     Lindsay [name] 08/25/2013, 12:29 PM  [facility] PT [phone]

## 2013-08-25 NOTE — Clinical Social Work Placement (Signed)
Clinical Social Work Department CLINICAL SOCIAL WORK PLACEMENT NOTE 08/25/2013  Patient:  HAWLEY, PAVIA  Account Number:  [id number] Admit date:  08/21/2013  Clinical Social Worker:  Cherre Blanc, [place]  Date/time:  08/25/2013 03:58 PM  Clinical Social Work is seeking post-discharge placement for this patient at the following level of care:   SKILLED NURSING   (*CSW will update this form in Epic as items are completed)   08/25/2013  Patient/family provided with [facility] Department of Clinical Social Work's list of facilities offering this level of care within the geographic area requested by the patient (or if unable, by the patient's family).  08/25/2013  Patient/family informed of their freedom to choose among providers that offer the needed level of care, that participate in Medicare, Medicaid or managed care program needed by the patient, have an available bed and are willing to accept the patient.  08/25/2013  Patient/family informed of [facility]' ownership interest in [facility], as well as of the fact that they are under no obligation to receive care at this facility.  PASARR submitted to EDS on 08/25/2013 PASARR number received from EDS on 08/25/2013  FL2 transmitted to all facilities in geographic area requested by pt/family on  08/25/2013 FL2 transmitted to all facilities within larger geographic area on   Patient informed that his/her managed care company has contracts with or will negotiate with  certain facilities, including the following:     Patient/family informed of bed offers received:   Patient chooses bed at  Physician recommends and patient chooses bed at    Patient to be transferred to  on   Patient to be transferred to facility by   The following physician request were entered in Epic:   Additional Comments:   Roddie Mc, [place], [place], [phone]

## 2013-08-25 NOTE — Clinical Social Work Psychosocial (Signed)
Clinical Social Work Department BRIEF PSYCHOSOCIAL ASSESSMENT 08/25/2013  Patient:  Lindsay [name], Lindsay [name]     Account Number:  [id number]     Admit date:  08/21/2013  Clinical Social Worker:  Lavell Luster  Date/Time:  08/25/2013 03:53 PM  Referred by:  Physician  Date Referred:  08/25/2013 Referred for  SNF Placement   Other Referral:   Interview type:  Patient Other interview type:   Patient alert and oriented at time of assessment.    PSYCHOSOCIAL DATA Living Status:  ALONE Admitted from facility:   Level of care:   Primary support name:  [employer] [phone]   [phone] Primary support relationship to patient:  CHILD, ADULT Degree of support available:   Support is adequate    CURRENT CONCERNS Current Concerns  Post-Acute Placement   Other Concerns:    SOCIAL WORK ASSESSMENT / PLAN CSW met with patient at bedside to discuss the patient's possible need for SNF care after DC. Patient is agreeable to SNF search in [facility], but seems to think she will be able to go home with HHPT services. Patient states that she lives alone but has a daughter that is very supportive. Patient does not report any past SNF stay or a preference for any particular facility.   Assessment/plan status:  Psychosocial Support/Ongoing Assessment of Needs Other assessment/ plan:   Complete FL2, Fax, PASRR   Information/referral to community resources:   CSW contact information and SNF list given to patient.    PATIENT'S/FAMILY'S RESPONSE TO PLAN OF CARE: Patient agreeable to SNF search and placement if necessary. Patient was pleasant, appropriate, and appreciative of CSW contact. Patietn awaits bed offers. CSW will continue to follow.   Roddie Mc, [place], [place], [phone]

## 2013-08-26 LAB — URINALYSIS, ROUTINE W REFLEX MICROSCOPIC
Bilirubin Urine: NEGATIVE
Glucose, UA: NEGATIVE mg/dL
Hgb urine dipstick: NEGATIVE
Ketones, ur: 15 mg/dL — AB
Leukocytes, UA: NEGATIVE
Nitrite: NEGATIVE
Protein, ur: NEGATIVE mg/dL
Specific Gravity, Urine: 1.027 (ref 1.005–1.030)
Urobilinogen, UA: 0.2 mg/dL (ref 0.0–1.0)
pH: 5 (ref 5.0–8.0)

## 2013-08-26 LAB — CBC
HCT: 36.5 % (ref 36.0–46.0)
Hemoglobin: 12 g/dL (ref 12.0–15.0)
MCH: 28 pg (ref 26.0–34.0)
MCHC: 32.9 g/dL (ref 30.0–36.0)
MCV: 85.1 fL (ref 78.0–100.0)
Platelets: 427 10*3/uL — ABNORMAL HIGH (ref 150–400)
RBC: 4.29 MIL/uL (ref 3.87–5.11)
RDW: 14.4 % (ref 11.5–15.5)
WBC: 16.4 10*3/uL — ABNORMAL HIGH (ref 4.0–10.5)

## 2013-08-26 LAB — BASIC METABOLIC PANEL
BUN: 15 mg/dL (ref 6–23)
CO2: 25 mEq/L (ref 19–32)
Calcium: 8.9 mg/dL (ref 8.4–10.5)
Chloride: 102 mEq/L (ref 96–112)
Creatinine, Ser: 1.05 mg/dL (ref 0.50–1.10)
GFR calc Af Amer: 66 mL/min — ABNORMAL LOW (ref >90)
GFR calc non Af Amer: 57 mL/min — ABNORMAL LOW (ref >90)
Glucose, Bld: 98 mg/dL (ref 70–99)
Potassium: 3.9 mEq/L (ref 3.5–5.1)
Sodium: 139 mEq/L (ref 135–145)

## 2013-08-26 MED ORDER — DIPHENHYDRAMINE HCL 50 MG/ML IJ SOLN
25.0000 mg | Freq: Three times a day (TID) | INTRAMUSCULAR | Status: DC | PRN
Start: 2013-08-26 — End: 2013-09-01
  Administered 2013-08-26 – 2013-08-31 (×11): 25 mg via INTRAVENOUS
  Filled 2013-08-26 (×9): qty 1

## 2013-08-26 MED ORDER — DIPHENHYDRAMINE HCL 25 MG PO CAPS
25.0000 mg | ORAL_CAPSULE | Freq: Once | ORAL | Status: AC
  Administered 2013-08-26: 25 mg via ORAL
  Filled 2013-08-26: qty 1

## 2013-08-26 MED ORDER — DIPHENHYDRAMINE HCL 50 MG/ML IJ SOLN
INTRAMUSCULAR | Status: AC
  Filled 2013-08-26: qty 1

## 2013-08-26 NOTE — Progress Notes (Signed)
Patient ID: Lindsay [name], female   DOB: 07/29/1951, 62 y.o.   MRN: [id number] I spoke to Lindsay [name] in length and she understands fully that we will be proceeding to the OR tomorrow am 08/27/13 for a repeat i&D of her right leg and possible skin grafting with cadaver skin.

## 2013-08-26 NOTE — Progress Notes (Signed)
[facility] PROGRESS NOTE  Lindsay [name] ZOX:[id number] DOB: 05/27/1951 DOA: 08/21/2013 PCP: No PCP Per Patient  HPI/Subjective:  On Ancef, WBCs about the same today 16.4 yesterday was 15.9. Had low grade fever of 99.5 yesterday.  Interim Summary Patient is a pleasant 62 year old female with a past medical history of hypertension who was admitted to the medicine service on 08/21/2013, presenting with complaints of increased right leg swelling, pain that had been present for over a week. In the emergency department she underwent incision and drainage. She was started on empiric IV antibiotic therapy with vancomycin and Zosyn. She had an MRI of her right lower extremity performed on 08/22/2013 which did not show evidence of abscess, osteomyelitis, myositis or deep fasciitis. On 08/23/2013 patient's leg appeared worse with significant pain, erythema and purulence. I discussed case with Dr. Ninetta Lights of infectious disease who made the recommendation to discontinue vancomycin and Zosyn, starting Cubicin and clindamycin. Patient on the evening of 08/22/2013 developing a rash to vancomycin. Case was also discussed case with Dr. Simeon Craft of orthopedic surgery who evaluated patient on 08/23/2013 and recommended aggressive irrigation and debridement in the OR today.  Wound Cultures positive for Staph aureus.                                                                                Assessment/Plan:  Cellulitis/abscess of right lower extremity Patient presenting with clinical signs and symptoms consistent with cellulitis/abscess. Undergone incision and drainage in the emergency department. She was started on empiric IV antibiotic therapy with Zosyn and vancomycin.  Overnight she developed rash to vancomycin.  Was on Cubicin and Clindamycin, culture growing MSSA, will switch to Ancef. S/p debridement and placement of wound VAC, VAC removed.  Start hydrotherapy, likely to have a skin graft when the  infection is controlled.  Hypertension  Blood pressure is better controlled, continue amlodipine 10 mg by mouth daily.  Hypokalemia A.m. results after the administration of oral potassium   Pain management She is on morphine 1 mg IV every 4 hours as needed and Norco 5/325 one tablet every 6 hours as needed  Code Status: Full code Disposition Plan: Changing antibiotics today, discontinuing vancomycin and Zosyn starting Cubicin and clindamycin  Consults Orthopedic surgery  Procedures:  Incision and drainage of abscess performed in the emergency department on 08/21/2013  Antibiotics:  Zosyn (started on 08/21/2013 discontinued on 08/23/2013)  Vancomycin (started on 08/21/2013 discontinued on 08/23/2013)  Cubicin (started on 08/23/2013) D/C on 12/10  Clindamycin (started on 08/23/2013) D/C on 12/10  Ancef started on 12/10  Objective: Filed Vitals:   08/26/13 0628  BP: 146/81  Pulse: 98  Temp: 98.7 F (37.1 C)  Resp: 18    Intake/Output Summary (Last 24 hours) at 08/26/13 1136 Last data filed at 08/26/13 0900  Gross per 24 hour  Intake   1135 ml  Output      0 ml  Net   1135 ml   Filed Weights   08/21/13 1702 08/21/13 2058  Weight: 94.348 kg (208 lb) 95.029 kg (209 lb 8 oz)    Exam:   General:  No acute distress she is awake alert and oriented x3  Cardiovascular: Regular rate  rhythm normal S1 is  Respiratory: Lungs are clear to auscultation bilaterally no wheezing rhonchi or rales  Abdomen: Soft nontender nondistended  Musculoskeletal: Patient's leg appears worse and today, with purulent drainage over multiple areas in a 5 cm diameter region. There is increased erythema and significant pain to palpation over this region.   Data Reviewed: Basic Metabolic Panel:  Recent Labs Lab 08/21/13 1400 08/22/13 0710 08/23/13 0640 08/25/13 0650 08/26/13 0750  NA 136 137 138 137 139  K 4.3 3.3* 3.6 3.6 3.9  CL 101 103 103 103 102  CO2 24 25 23 23 25    GLUCOSE 91 101* 106* 109* 98  BUN 13 11 19 15 15   CREATININE 0.98 1.10 1.31* 1.03 1.05  CALCIUM 9.0 8.9 8.7 8.8 8.9   Liver Function Tests:  Recent Labs Lab 08/22/13 0710  AST 11  ALT 11  ALKPHOS 69  BILITOT 0.6  PROT 7.6  ALBUMIN 3.0*   No results found for this basename: LIPASE, AMYLASE,  in the last 168 hours No results found for this basename: AMMONIA,  in the last 168 hours CBC:  Recent Labs Lab 08/21/13 1400 08/21/13 2240 08/23/13 0640 08/24/13 0651 08/25/13 0650 08/26/13 0750  WBC 14.4* 13.8* 13.3* 17.5* 15.9* 16.4*  NEUTROABS  --  9.8*  --   --   --   --   HGB 13.8 13.2 12.9 12.4 12.3 12.0  HCT 41.0 38.6 38.8 37.9 37.1 36.5  MCV 85.2 84.3 85.8 86.3 86.3 85.1  PLT 362 384 394 401* 416* 427*   Cardiac Enzymes: No results found for this basename: CKTOTAL, CKMB, CKMBINDEX, TROPONINI,  in the last 168 hours BNP (last 3 results) No results found for this basename: PROBNP,  in the last 8760 hours CBG: No results found for this basename: GLUCAP,  in the last 168 hours  Recent Results (from the past 240 hour(s))  WOUND CULTURE     Status: None   Collection Time    08/21/13  8:43 PM      Result Value Range Status   Specimen Description WOUND   Final   Special Requests RIGHT LATERAL CALF PATIENT ON FOLLOWING VANCOMYCIN   Final   Gram Stain     Final   Value: NO WBC SEEN     NO SQUAMOUS EPITHELIAL CELLS SEEN     FEW GRAM POSITIVE COCCI IN PAIRS     Performed at [facility]   Culture     Final   Value: MODERATE STAPHYLOCOCCUS AUREUS     Note: RIFAMPIN AND GENTAMICIN SHOULD NOT BE USED AS SINGLE DRUGS FOR TREATMENT OF STAPH INFECTIONS. This organism DOES NOT demonstrate inducible Clindamycin resistance in vitro.     Performed at [facility]   Report Status 08/24/2013 FINAL   Final   Organism ID, Bacteria STAPHYLOCOCCUS AUREUS   Final  SURGICAL PCR SCREEN     Status: None   Collection Time    08/23/13  4:15 PM      Result Value Range Status    MRSA, PCR NEGATIVE  NEGATIVE Final   Staphylococcus aureus NEGATIVE  NEGATIVE Final   Comment:            The Xpert SA Assay (FDA     approved for NASAL specimens     in patients over 16 years of age),     is one component of     a comprehensive surveillance     program.  Test performance has  been validated by [facility] for patients greater     than or equal to 20 year old.     It is not intended     to diagnose infection nor to     guide or monitor treatment.     Studies: No results found.  Scheduled Meds: . amLODipine  10 mg Oral Daily  .  ceFAZolin (ANCEF) IV  2 g Intravenous Q8H  . heparin subcutaneous  5,000 Units Subcutaneous Q8H   Continuous Infusions: . sodium chloride 20 mL (08/25/13 1036)    Principal Problem:   Cellulitis Active Problems:   OBSTRUCTIVE SLEEP APNEA   HTN (hypertension)    Time spent: 35 min    [facility] A  [facility] Pager [phone]. If 7PM-7AM, please contact night-coverage at [email or web address], password [facility] 08/26/2013, 11:36 AM  LOS: 5 days

## 2013-08-26 NOTE — Progress Notes (Addendum)
CSW received call from pt's daughter Eustace Quail requested bed offers, and CSW provided the names of the facilities who can accept pt. Clarisse Gouge asked if there is a possibility of taking pt home and having home health, and CSW explained that home health services do not take place as frequently and that pt would get more intensive PT at a rehab facility. Clarisse Gouge understanding of this. Clarisse Gouge coming to the hospital this evening after she picks her daughter up from college. CSW explained that we need a choice facility as soon as possible, as we need a SNF for pt to go to if she is medically ready for discharge. CSW explained that in unit CSW's note from yesterday, MD thought pt may be ready for discharge today. Clarisse Gouge states pt is having a skin graft tomorrow--CSW will update Clarisse Gouge if there are any changes in pt disposition/discharge.  Addendum: CSW received call from [facility] that pt is getting a skin graft this weekend, and pt likely will not discharge until Monday. Daughter and pt will go over SNF options and make a choice before pt is ready to go next week.  Maryclare Labrador, MSW, [facility] Clinical Social Worker [phone]

## 2013-08-26 NOTE — Progress Notes (Signed)
Physical Therapy Wound Treatment Patient Details  Name: Lindsay [name] MRN: [id number] Date of Birth: 03/15/1951  Today's Date: 08/26/2013 Time: 1040-1120 Time Calculation (min): 40 min  Subjective  Subjective: Pt asking if it looks better.  Pain Score: Pain Score: Severe pain with hydrotherapy and dressing change.  Pt was premedicated prior and repositioned after treatment.  Wound Assessment  Wound 08/21/13 Other (Comment) Leg Right;Lateral  5 cm diameter open blister (Active)  Site / Wound Assessment Red;Yellow;Painful 08/26/2013 11:57 AM  % Wound base Red or Granulating 70% 08/26/2013 11:57 AM  % Wound base Yellow 30% 08/26/2013 11:57 AM  % Wound base Black 0% 08/26/2013 11:57 AM  % Wound base Other (Comment) 0% 08/26/2013 11:57 AM  Peri-wound Assessment Edema;Erythema (blanchable) 08/26/2013 11:57 AM  Wound Length (cm) 7 cm 08/25/2013 12:18 PM  Wound Width (cm) 8 cm 08/25/2013 12:18 PM  Wound Depth (cm) 0.5 cm 08/25/2013 12:18 PM  Drainage Amount Moderate 08/26/2013 11:57 AM  Drainage Description Purulent;Sanguineous 08/26/2013 11:57 AM  Treatment Hydrotherapy (Pulse lavage) 08/26/2013 11:57 AM  Dressing Type ABD;Impregnated gauze (bismuth);Gauze (Comment);Compression wrap 08/26/2013 11:57 AM  Dressing Changed Changed 08/26/2013 11:57 AM  Dressing Status Clean 08/26/2013 11:57 AM      Hydrotherapy Pulsed lavage therapy - wound location: Rt anterolateral tibial surface Pulsed Lavage with Suction (psi): 4 psi Pulsed Lavage with Suction - Normal Saline Used: Other (comment) (700) Pulsed Lavage Tip: Tip with splash shield   Wound Assessment and Plan  Wound Therapy - Assess/Plan/Recommendations Wound Therapy - Clinical Statement: Pt continues to have severe pain with hydrotherapy treatment.  Leg remains swollen and purulent drainage weeping from surrounding tissue. Hydrotherapy Plan: Debridement;Dressing change;Patient/family education;Pulsatile lavage with suction Wound  Therapy - Frequency: 6X / week Wound Therapy - Current Recommendations: PT Wound Therapy - Follow Up Recommendations: Home health RN Wound Plan: See above.  Wound Therapy Goals- Improve the function of patient's integumentary system by progressing the wound(s) through the phases of wound healing (inflammation - proliferation - remodeling) by: Decrease Necrotic Tissue - Progress: Not progressing Increase Granulation Tissue - Progress: Mot progressing  Goals will be updated until maximal potential achieved or discharge criteria met.  Discharge criteria: when goals achieved, discharge from hospital, MD decision/surgical intervention, no progress towards goals, refusal/missing three consecutive treatments without notification or medical reason.  GP     Lindsay [name] 08/26/2013, 12:01 PM  [facility] PT [phone]

## 2013-08-26 NOTE — Progress Notes (Signed)
Patient ID: Lindsay [name], female   DOB: 07-Jan-1951, 62 y.o.   MRN: [id number] Given persistently high WBC, will need to return to the OR tomorrow for a repeat I&D.  The right distal tibia wound looks good though.  She had hydrotherapy yesterday and will need it again today.  I will talk with her this evening about surgery tomorrow.  May be able to skin graft the distal tibia wound tomorrow.

## 2013-08-27 ENCOUNTER — Inpatient Hospital Stay (HOSPITAL_COMMUNITY): Payer: 59 | Admitting: Certified Registered Nurse Anesthetist

## 2013-08-27 ENCOUNTER — Encounter (HOSPITAL_COMMUNITY): Payer: Self-pay | Admitting: Anesthesiology

## 2013-08-27 ENCOUNTER — Encounter (HOSPITAL_COMMUNITY)
Admission: EM | Disposition: A | Discharge status: Home / Self Care | Payer: Self-pay | Source: Home / Self Care | Attending: Internal Medicine

## 2013-08-27 ENCOUNTER — Encounter (HOSPITAL_COMMUNITY): Payer: 59 | Admitting: Certified Registered Nurse Anesthetist

## 2013-08-27 DIAGNOSIS — A4901 Methicillin susceptible Staphylococcus aureus infection, unspecified site: Secondary | ICD-10-CM

## 2013-08-27 DIAGNOSIS — L089 Local infection of the skin and subcutaneous tissue, unspecified: Secondary | ICD-10-CM

## 2013-08-27 DIAGNOSIS — T360X5A Adverse effect of penicillins, initial encounter: Secondary | ICD-10-CM

## 2013-08-27 DIAGNOSIS — L270 Generalized skin eruption due to drugs and medicaments taken internally: Secondary | ICD-10-CM

## 2013-08-27 HISTORY — PX: I & D EXTREMITY: SHX5045

## 2013-08-27 LAB — CBC
HCT: 35.9 % — ABNORMAL LOW (ref 36.0–46.0)
Hemoglobin: 11.8 g/dL — ABNORMAL LOW (ref 12.0–15.0)
MCH: 28.3 pg (ref 26.0–34.0)
MCHC: 32.9 g/dL (ref 30.0–36.0)
MCV: 86.1 fL (ref 78.0–100.0)
Platelets: 421 10*3/uL — ABNORMAL HIGH (ref 150–400)
RBC: 4.17 MIL/uL (ref 3.87–5.11)
RDW: 14.4 % (ref 11.5–15.5)
WBC: 15.9 10*3/uL — ABNORMAL HIGH (ref 4.0–10.5)

## 2013-08-27 LAB — DIFFERENTIAL
Basophils Absolute: 0 10*3/uL (ref 0.0–0.1)
Basophils Relative: 0 % (ref 0–1)
Eosinophils Absolute: 0.7 10*3/uL (ref 0.0–0.7)
Eosinophils Relative: 4 % (ref 0–5)
Lymphocytes Relative: 9 % — ABNORMAL LOW (ref 12–46)
Lymphs Abs: 1.4 10*3/uL (ref 0.7–4.0)
Monocytes Absolute: 0.8 10*3/uL (ref 0.1–1.0)
Monocytes Relative: 5 % (ref 3–12)
Neutro Abs: 13.4 10*3/uL — ABNORMAL HIGH (ref 1.7–7.7)
Neutrophils Relative %: 82 % — ABNORMAL HIGH (ref 43–77)

## 2013-08-27 LAB — GLUCOSE, CAPILLARY: Glucose-Capillary: 101 mg/dL — ABNORMAL HIGH (ref 70–99)

## 2013-08-27 SURGERY — IRRIGATION AND DEBRIDEMENT EXTREMITY
Anesthesia: General | Laterality: Right

## 2013-08-27 MED ORDER — SODIUM CHLORIDE 0.9 % IV SOLN
8.0000 mg/kg | INTRAVENOUS | Status: DC
  Administered 2013-08-27 – 2013-08-31 (×6): 760 mg via INTRAVENOUS
  Filled 2013-08-27 (×8): qty 15.2

## 2013-08-27 MED ORDER — HYDROMORPHONE HCL PF 1 MG/ML IJ SOLN
0.5000 mg | INTRAMUSCULAR | Status: AC | PRN
  Administered 2013-08-27 (×4): 0.5 mg via INTRAVENOUS

## 2013-08-27 MED ORDER — METOCLOPRAMIDE HCL 5 MG/ML IJ SOLN: 10.0000 mg | Freq: Once | INTRAMUSCULAR | Status: DC | PRN

## 2013-08-27 MED ORDER — SODIUM CHLORIDE 0.9 % IR SOLN
Status: DC | PRN
  Administered 2013-08-27: 5000 mL

## 2013-08-27 MED ORDER — DIPHENHYDRAMINE HCL 50 MG/ML IJ SOLN
INTRAMUSCULAR | Status: AC
  Administered 2013-08-27: 25 mg via INTRAVENOUS
  Filled 2013-08-27: qty 1

## 2013-08-27 MED ORDER — PROPOFOL 10 MG/ML IV BOLUS
INTRAVENOUS | Status: DC | PRN
  Administered 2013-08-27: 20 mg via INTRAVENOUS
  Administered 2013-08-27: 180 mg via INTRAVENOUS
  Administered 2013-08-27 (×2): 50 mg via INTRAVENOUS

## 2013-08-27 MED ORDER — MIDAZOLAM HCL 5 MG/5ML IJ SOLN
INTRAMUSCULAR | Status: DC | PRN
  Administered 2013-08-27: 1 mg via INTRAVENOUS

## 2013-08-27 MED ORDER — OXYCODONE HCL 5 MG PO TABS
ORAL_TABLET | ORAL | Status: AC
  Administered 2013-08-27: 10 mg via ORAL
  Filled 2013-08-27: qty 2

## 2013-08-27 MED ORDER — FENTANYL CITRATE 0.05 MG/ML IJ SOLN
INTRAMUSCULAR | Status: DC | PRN
  Administered 2013-08-27 (×5): 50 ug via INTRAVENOUS

## 2013-08-27 MED ORDER — LACTATED RINGERS IV SOLN
INTRAVENOUS | Status: DC | PRN
  Administered 2013-08-27: 07:00:00 via INTRAVENOUS

## 2013-08-27 MED ORDER — MINERAL OIL LIGHT 100 % EX OIL
TOPICAL_OIL | CUTANEOUS | Status: AC
  Filled 2013-08-27: qty 25

## 2013-08-27 MED ORDER — LIDOCAINE HCL (CARDIAC) 20 MG/ML IV SOLN
INTRAVENOUS | Status: DC | PRN
  Administered 2013-08-27: 100 mg via INTRAVENOUS

## 2013-08-27 MED ORDER — RIFAMPIN 300 MG PO CAPS
300.0000 mg | ORAL_CAPSULE | Freq: Two times a day (BID) | ORAL | Status: DC
  Administered 2013-08-27: 300 mg via ORAL
  Filled 2013-08-27 (×2): qty 1

## 2013-08-27 MED ORDER — HYDROMORPHONE HCL PF 1 MG/ML IJ SOLN
INTRAMUSCULAR | Status: AC
  Administered 2013-08-27: 0.5 mg via INTRAVENOUS
  Filled 2013-08-27: qty 2

## 2013-08-27 MED ORDER — WHITE PETROLATUM GEL
Status: AC
  Administered 2013-08-27: 0.2
  Filled 2013-08-27: qty 5

## 2013-08-27 SURGICAL SUPPLY — 59 items
BANDAGE CONFORM 3  STR LF (GAUZE/BANDAGES/DRESSINGS) IMPLANT
BANDAGE ELASTIC 3 VELCRO ST LF (GAUZE/BANDAGES/DRESSINGS) IMPLANT
BANDAGE ELASTIC 6 VELCRO ST LF (GAUZE/BANDAGES/DRESSINGS) ×2 IMPLANT
BLADE SURG 10 STRL SS (BLADE) IMPLANT
BNDG COHESIVE 1X5 TAN STRL LF (GAUZE/BANDAGES/DRESSINGS) IMPLANT
BNDG COHESIVE 4X5 TAN STRL (GAUZE/BANDAGES/DRESSINGS) IMPLANT
BNDG COHESIVE 6X5 TAN STRL LF (GAUZE/BANDAGES/DRESSINGS) IMPLANT
BNDG GAUZE STRTCH 6 (GAUZE/BANDAGES/DRESSINGS) ×2 IMPLANT
CLOTH BEACON ORANGE TIMEOUT ST (SAFETY) IMPLANT
CORDS BIPOLAR (ELECTRODE) IMPLANT
COVER SURGICAL LIGHT HANDLE (MISCELLANEOUS) ×2 IMPLANT
CUFF TOURNIQUET SINGLE 18IN (TOURNIQUET CUFF) IMPLANT
CUFF TOURNIQUET SINGLE 24IN (TOURNIQUET CUFF) IMPLANT
CUFF TOURNIQUET SINGLE 34IN LL (TOURNIQUET CUFF) IMPLANT
CUFF TOURNIQUET SINGLE 44IN (TOURNIQUET CUFF) IMPLANT
DRAPE ORTHO SPLIT 77X108 STRL (DRAPES) ×2
DRAPE SURG 17X23 STRL (DRAPES) IMPLANT
DRAPE SURG ORHT 6 SPLT 77X108 (DRAPES) ×2 IMPLANT
DRAPE U-SHAPE 47X51 STRL (DRAPES) ×2 IMPLANT
DRSG MEPILEX BORDER 4X8 (GAUZE/BANDAGES/DRESSINGS) ×2 IMPLANT
DRSG PAD ABDOMINAL 8X10 ST (GAUZE/BANDAGES/DRESSINGS) ×4 IMPLANT
DURAPREP 26ML APPLICATOR (WOUND CARE) IMPLANT
ELECT CAUTERY BLADE 6.4 (BLADE) IMPLANT
ELECT REM PT RETURN 9FT ADLT (ELECTROSURGICAL) ×2
ELECTRODE REM PT RTRN 9FT ADLT (ELECTROSURGICAL) ×1 IMPLANT
GAUZE XEROFORM 1X8 LF (GAUZE/BANDAGES/DRESSINGS) ×4 IMPLANT
GAUZE XEROFORM 5X9 LF (GAUZE/BANDAGES/DRESSINGS) ×2 IMPLANT
GLOVE BIO SURGEON STRL SZ8 (GLOVE) ×2 IMPLANT
GLOVE BIOGEL PI IND STRL 8 (GLOVE) ×2 IMPLANT
GLOVE BIOGEL PI INDICATOR 8 (GLOVE) ×2
GLOVE ORTHO TXT STRL SZ7.5 (GLOVE) ×4 IMPLANT
GOWN PREVENTION PLUS LG XLONG (DISPOSABLE) IMPLANT
GOWN PREVENTION PLUS XLARGE (GOWN DISPOSABLE) ×4 IMPLANT
GOWN STRL NON-REIN LRG LVL3 (GOWN DISPOSABLE) ×2 IMPLANT
HANDPIECE INTERPULSE COAX TIP (DISPOSABLE) ×2
KIT BASIN OR (CUSTOM PROCEDURE TRAY) ×2 IMPLANT
KIT ROOM TURNOVER OR (KITS) ×2 IMPLANT
MANIFOLD NEPTUNE II (INSTRUMENTS) ×2 IMPLANT
NS IRRIG 1000ML POUR BTL (IV SOLUTION) ×2 IMPLANT
PACK ORTHO EXTREMITY (CUSTOM PROCEDURE TRAY) ×2 IMPLANT
PAD ARMBOARD 7.5X6 YLW CONV (MISCELLANEOUS) ×4 IMPLANT
PADDING CAST ABS 4INX4YD NS (CAST SUPPLIES)
PADDING CAST ABS COTTON 4X4 ST (CAST SUPPLIES) IMPLANT
PADDING CAST COTTON 6X4 STRL (CAST SUPPLIES) IMPLANT
SET HNDPC FAN SPRY TIP SCT (DISPOSABLE) ×1 IMPLANT
SPONGE GAUZE 4X4 12PLY (GAUZE/BANDAGES/DRESSINGS) ×6 IMPLANT
SPONGE LAP 18X18 X RAY DECT (DISPOSABLE) ×4 IMPLANT
STOCKINETTE IMPERVIOUS 9X36 MD (GAUZE/BANDAGES/DRESSINGS) ×2 IMPLANT
SUT ETHILON 2 0 FS 18 (SUTURE) ×6 IMPLANT
SUT ETHILON 3 0 PS 1 (SUTURE) ×4 IMPLANT
SYR CONTROL 10ML LL (SYRINGE) IMPLANT
TOWEL OR 17X24 6PK STRL BLUE (TOWEL DISPOSABLE) ×2 IMPLANT
TOWEL OR 17X26 10 PK STRL BLUE (TOWEL DISPOSABLE) ×2 IMPLANT
TUBE ANAEROBIC SPECIMEN COL (MISCELLANEOUS) IMPLANT
TUBE CONNECTING 12X1/4 (SUCTIONS) ×2 IMPLANT
TUBE FEEDING 5FR 15 INCH (TUBING) IMPLANT
UNDERPAD 30X30 INCONTINENT (UNDERPADS AND DIAPERS) ×2 IMPLANT
WATER STERILE IRR 1000ML POUR (IV SOLUTION) ×10 IMPLANT
YANKAUER SUCT BULB TIP NO VENT (SUCTIONS) ×4 IMPLANT

## 2013-08-27 NOTE — Progress Notes (Signed)
ANTIBIOTIC CONSULT NOTE  Pharmacy Consult for cefazolin Indication: cellulitis, MSSA  Allergies  Allergen Reactions  . Vancomycin Rash    Patient Measurements: Height: 5' 3.6" (161.5 cm) Weight: 209 lb 8 oz (95.029 kg) IBW/kg (Calculated) : 53.78  Vital Signs: Temp: 98.5 F (36.9 C) (12/13 1011) Temp src: Oral (12/13 1011) BP: 170/78 mmHg (12/13 1011) Pulse Rate: 102 (12/13 1011) Intake/Output from previous day: 12/12 0701 - 12/13 0700 In: 1628.3 [P.O.:840; I.V.:738.3; IV Piggyback:50] Out: 700 [Urine:700] Intake/Output from this shift: Total I/O In: 800 [I.V.:800] Out: -   Labs:  Recent Labs  08/25/13 0650 08/26/13 0750 08/27/13 0455  WBC 15.9* 16.4* 15.9*  HGB 12.3 12.0 11.8*  PLT 416* 427* 421*  CREATININE 1.03 1.05  --    Estimated Creatinine Clearance: 63.2 ml/min (by C-G formula based on Cr of 1.05). No results found for this basename: VANCOTROUGH, VANCOPEAK, VANCORANDOM, GENTTROUGH, GENTPEAK, GENTRANDOM, TOBRATROUGH, TOBRAPEAK, TOBRARND, AMIKACINPEAK, AMIKACINTROU, AMIKACIN,  in the last 72 hours   Microbiology: Recent Results (from the past 720 hour(s))  WOUND CULTURE     Status: None   Collection Time    08/21/13  8:43 PM      Result Value Range Status   Specimen Description WOUND   Final   Special Requests RIGHT LATERAL CALF PATIENT ON FOLLOWING VANCOMYCIN   Final   Gram Stain     Final   Value: NO WBC SEEN     NO SQUAMOUS EPITHELIAL CELLS SEEN     FEW GRAM POSITIVE COCCI IN PAIRS     Performed at [facility]   Culture     Final   Value: MODERATE STAPHYLOCOCCUS AUREUS     Note: RIFAMPIN AND GENTAMICIN SHOULD NOT BE USED AS SINGLE DRUGS FOR TREATMENT OF STAPH INFECTIONS. This organism DOES NOT demonstrate inducible Clindamycin resistance in vitro.     Performed at [facility]   Report Status 08/24/2013 FINAL   Final   Organism ID, Bacteria STAPHYLOCOCCUS AUREUS   Final  SURGICAL PCR SCREEN     Status: None   Collection Time     08/23/13  4:15 PM      Result Value Range Status   MRSA, PCR NEGATIVE  NEGATIVE Final   Staphylococcus aureus NEGATIVE  NEGATIVE Final   Comment:            The Xpert SA Assay (FDA     approved for NASAL specimens     in patients over 81 years of age),     is one component of     a comprehensive surveillance     program.  Test performance has     been validated by [employer] for patients greater     than or equal to 63 year old.     It is not intended     to diagnose infection nor to     guide or monitor treatment.    Medical History: Past Medical History  Diagnosis Date  . Hypertension     Medications:  No prescriptions prior to admission   Scheduled:  . amLODipine  10 mg Oral Daily  .  ceFAZolin (ANCEF) IV  2 g Intravenous Q8H  . heparin subcutaneous  5,000 Units Subcutaneous Q8H  . rifampin  300 mg Oral Q12H    Assessment: 62 yo female with cellulitis started on vancomycin and zosyn. She developed a fine rash after dose of vanc and did not appear to be improving.  ID rec change to clindamycin and daptomycin.  Culture MSSA, abx changed to ancef. Repeat I&D today. Tmax 99.5, WBC 15.9, SCr stable at 1.05 (CrCl ~63 ml/min)  vancomycin 12/7>>12/9 clinda 12/9>>12/10 daptomycin 12/9>>12/10 Ancef 12/10 >> Rifampin 12/13>>  12/7 wound cx>>MSSA  Goal of Therapy:  Eradication of infection  Plan:  - Continue Ancef 2gm IV q8 hours - Will follow renal function and clinical progress - F/u LOT  Margie Billet, PharmD Clinical Pharmacist - Resident Pager: [phone] Pharmacy: [phone] 08/27/2013 1:11 PM

## 2013-08-27 NOTE — Consult Note (Signed)
  She understands fully that we are proceeding to surgery today for a repeat I&D of her right leg wound and a possible skin graft.

## 2013-08-27 NOTE — Anesthesia Postprocedure Evaluation (Signed)
Anesthesia Post Note  Patient: Lindsay [name]  Procedure(s) Performed: Procedure(s) (LRB): REPEAT IRRIGATION AND DEBRIDEMENT OF RIGHT LEG WOUND (Right)  Anesthesia type: General  Patient location: PACU  Post pain: Pain level controlled  Post assessment: Patient's Cardiovascular Status Stable  Last Vitals:  Filed Vitals:   08/27/13 0930  BP:   Pulse: 105  Temp: 37.2 C  Resp: 24    Post vital signs: Reviewed and stable  Level of consciousness: alert  Complications: No apparent anesthesia complications

## 2013-08-27 NOTE — Anesthesia Preprocedure Evaluation (Signed)
Anesthesia Evaluation  Patient identified by MRN, date of birth, ID band Patient awake    Reviewed: Allergy & Precautions, H&P , NPO status , Patient's Chart, lab work & pertinent test results, reviewed documented beta blocker date and time   Airway Mallampati: II TM Distance: >3 FB Neck ROM: full    Dental   Pulmonary sleep apnea ,  breath sounds clear to auscultation        Cardiovascular hypertension, On Medications Rhythm:regular     Neuro/Psych negative neurological ROS  negative psych ROS   GI/Hepatic negative GI ROS, Neg liver ROS,   Endo/Other  Morbid obesity  Renal/GU negative Renal ROS  negative genitourinary   Musculoskeletal   Abdominal   Peds  Hematology negative hematology ROS (+)   Anesthesia Other Findings See surgeon's H&P   Reproductive/Obstetrics negative OB ROS                           Anesthesia Physical Anesthesia Plan  ASA: III  Anesthesia Plan: General   Post-op Pain Management:    Induction: Intravenous  Airway Management Planned: LMA  Additional Equipment:   Intra-op Plan:   Post-operative Plan:   Informed Consent: I have reviewed the patients History and Physical, chart, labs and discussed the procedure including the risks, benefits and alternatives for the proposed anesthesia with the patient or authorized representative who has indicated his/her understanding and acceptance.   Dental Advisory Given  Plan Discussed with: CRNA and Surgeon  Anesthesia Plan Comments:         Anesthesia Quick Evaluation

## 2013-08-27 NOTE — Consult Note (Signed)
[facility]  Total days of antibiotics 7        Day 4 cefazolin        Day 1 rif        ( 2 days of vanco/piptazo, 2 days dapto, 1 day clinda)       Reason for Consult: cellulitis and drug allergy    Referring Physician: elmahi  Principal Problem:   Cellulitis Active Problems:   OBSTRUCTIVE SLEEP APNEA   HTN (hypertension)    HPI: Lindsay [name] is a 62 y.o. female with hypertension, obesity and OSA. She presented to the ED on 12/07 for worsening drainage, redness, swelling to her right lower leg where she was treating a boil that did not respond to epsom salt soaks or neosporin. ED attempted to lance but unsuccessful. She was admitted for IV antibiotics. She was started on vancomycin and piptazo but started to have a rash, initially attributed to vancomycin. She was switched to clindamycin and daptomycin in part to drug allergy and leg not significantly improved. She was also evaluated by dr. Magnus Ivan from orthopedics for need for surgical debridement. She underwent a 5 x 7 cm area of debridement to anterior tibia region of right lower leg on 12/09. OR cultures sent showed MSSA. She was converted to cefazolin for which she has been on for 4 days. She still remains to have a pruritic rash, and elevated WBC of 16.4K. Dr. Arthor Captain concern that she may have cephalosporin allergy to account for rash and leukocytosis . She went for repeat I x D on 12/13 which still showed some evidence of purulence in wound bed.  Past Medical History  Diagnosis Date  . Hypertension     Allergies:  Allergies  Allergen Reactions  . Vancomycin Rash      MEDICATIONS: . amLODipine  10 mg Oral Daily  . DAPTOmycin (CUBICIN)  IV  8 mg/kg Intravenous Q24H  . heparin subcutaneous  5,000 Units Subcutaneous Q8H    History  Substance Use Topics  . Smoking status: Never Smoker   . Smokeless tobacco: Not on file  . Alcohol Use: No    Family History  Problem Relation Age of Onset    . CAD Mother      Review of Systems  Constitutional: Negative for fever, chills, diaphoresis, activity change, appetite change, fatigue and unexpected weight change.  HENT: Negative for congestion, sore throat, rhinorrhea, sneezing, trouble swallowing and sinus pressure.  Eyes: Negative for photophobia and visual disturbance.  Respiratory: Negative for cough, chest tightness, shortness of breath, wheezing and stridor.  Cardiovascular: Negative for chest pain, palpitations and leg swelling.  Gastrointestinal: Negative for nausea, vomiting, abdominal pain, diarrhea, constipation, blood in stool, abdominal distention and anal bleeding.  Genitourinary: Negative for dysuria, hematuria, flank pain and difficulty urinating.  Musculoskeletal: Negative for myalgias, back pain, joint swelling, arthralgias and gait problem.  Skin:positive for pruritic rash on arms, back. No color change, pallor, Neurological: Negative for dizziness, tremors, weakness and light-headedness.  Hematological: Negative for adenopathy. Does not bruise/bleed easily.  Psychiatric/Behavioral: Negative for behavioral problems, confusion, sleep disturbance, dysphoric mood, decreased concentration and agitation.     OBJECTIVE: Temp:  [97.9 F (36.6 C)-99.3 F (37.4 C)] 97.9 F (36.6 C) (12/13 1344) Pulse Rate:  [94-106] 98 (12/13 1344) Resp:  [18-24] 20 (12/13 1344) BP: (115-170)/(43-81) 125/69 mmHg (12/13 1344) SpO2:  [94 %-100 %] 96 % (12/13 1344)  Constitutional:  oriented to person, place, and time.  appears well-developed and  well-nourished. No distress.  HENT:  Mouth/Throat: Oropharynx is clear and moist. No oropharyngeal exudate.  Cardiovascular: Normal rate, regular rhythm and normal heart sounds. Exam reveals no gallop and no friction rub.  No murmur heard.  Pulmonary/Chest: Effort normal and breath sounds normal. No respiratory distress. has no wheezes.  Abdominal: Soft. Bowel sounds are normal.  exhibits no  distension. There is no tenderness.  Lymphadenopathy:  no cervical adenopathy.  Neurological:  alert and oriented to person, place, and time.  Skin: Skin is warm and dry. She has raised macularpapular rash to arms bilaterally, shoulder and torso. Sparing legs. Right lower leg wrapped from surgery. She has patch of nonblanching petechaiel rash to flexor surface of wrist Psychiatric:  a normal mood and affect.behavior is normal.    LABS: Results for orders placed during the hospital encounter of 08/21/13 (from the past 48 hour(s))  BASIC METABOLIC PANEL     Status: Abnormal   Collection Time    08/26/13  7:50 AM      Result Value Range   Sodium 139  135 - 145 mEq/L   Potassium 3.9  3.5 - 5.1 mEq/L   Chloride 102  96 - 112 mEq/L   CO2 25  19 - 32 mEq/L   Glucose, Bld 98  70 - 99 mg/dL   BUN 15  6 - 23 mg/dL   Creatinine, Ser 0.45  0.50 - 1.10 mg/dL   Calcium 8.9  8.4 - 40.9 mg/dL   GFR calc non Af Amer 57 (*) >90 mL/min   GFR calc Af Amer 66 (*) >90 mL/min   Comment: (NOTE)     The eGFR has been calculated using the CKD EPI equation.     This calculation has not been validated in all clinical situations.     eGFR's persistently <90 mL/min signify possible Chronic Kidney     Disease.  CBC     Status: Abnormal   Collection Time    08/26/13  7:50 AM      Result Value Range   WBC 16.4 (*) 4.0 - 10.5 K/uL   RBC 4.29  3.87 - 5.11 MIL/uL   Hemoglobin 12.0  12.0 - 15.0 g/dL   HCT 81.1  91.4 - 78.2 %   MCV 85.1  78.0 - 100.0 fL   MCH 28.0  26.0 - 34.0 pg   MCHC 32.9  30.0 - 36.0 g/dL   RDW 95.6  21.3 - 08.6 %   Platelets 427 (*) 150 - 400 K/uL  URINALYSIS, ROUTINE W REFLEX MICROSCOPIC     Status: Abnormal   Collection Time    08/26/13  3:28 PM      Result Value Range   Color, Urine YELLOW  YELLOW   APPearance HAZY (*) CLEAR   Specific Gravity, Urine 1.027  1.005 - 1.030   pH 5.0  5.0 - 8.0   Glucose, UA NEGATIVE  NEGATIVE mg/dL   Hgb urine dipstick NEGATIVE  NEGATIVE    Bilirubin Urine NEGATIVE  NEGATIVE   Ketones, ur 15 (*) NEGATIVE mg/dL   Protein, ur NEGATIVE  NEGATIVE mg/dL   Urobilinogen, UA 0.2  0.0 - 1.0 mg/dL   Nitrite NEGATIVE  NEGATIVE   Leukocytes, UA NEGATIVE  NEGATIVE   Comment: MICROSCOPIC NOT DONE ON URINES WITH NEGATIVE PROTEIN, BLOOD, LEUKOCYTES, NITRITE, OR GLUCOSE <1000 mg/dL.  CBC     Status: Abnormal   Collection Time    08/27/13  4:55 AM      Result Value  Range   WBC 15.9 (*) 4.0 - 10.5 K/uL   RBC 4.17  3.87 - 5.11 MIL/uL   Hemoglobin 11.8 (*) 12.0 - 15.0 g/dL   HCT 40.9 (*) 81.1 - 91.4 %   MCV 86.1  78.0 - 100.0 fL   MCH 28.3  26.0 - 34.0 pg   MCHC 32.9  30.0 - 36.0 g/dL   RDW 78.2  95.6 - 21.3 %   Platelets 421 (*) 150 - 400 K/uL  GLUCOSE, CAPILLARY     Status: Abnormal   Collection Time    08/27/13  6:26 AM      Result Value Range   Glucose-Capillary 101 (*) 70 - 99 mg/dL    MICRO: 08/6 wound cx: MSSA IMAGING: 12/8 mri: Superficial cellulitis of the anterior medial aspect of the right  lower leg with no abscess or osteomyelitis or myositis or deep  fasciitis.  2. Nodular lesion on the skin of the lateral aspect of the right  lower leg as described.   Assessment/Plan:  62yo F with MSSA skin/soft tissue infection of right lower leg. Appears to have probable cephalosporin allergy with rash.  - recommend to switch to daptomycin 8mg /kg daily - will add differential to see if she has eosinophilia - continue would care per dr. Eliberto Ivory recs - can discontinue rifampin and cefazolin - recommend to continue with IV antibiotics while hospitalized and can re-evaluate to see if can switch over to orals if leukocytosis improves.  Spent 45 min with Lindsay [name] with greater than 50% in counseling, coordination of care, and reviewing records  Akeiba Axelson B. Drue Second MD MPH [facility] [phone]

## 2013-08-27 NOTE — Anesthesia Procedure Notes (Signed)
Procedure Name: LMA Insertion Date/Time: 08/27/2013 7:39 AM Performed by: Coralee Rud Pre-anesthesia Checklist: Patient identified, Emergency Drugs available, Suction available and Patient being monitored Patient Re-evaluated:Patient Re-evaluated prior to inductionOxygen Delivery Method: Circle system utilized Preoxygenation: Pre-oxygenation with 100% oxygen Intubation Type: IV induction LMA: LMA inserted

## 2013-08-27 NOTE — Progress Notes (Signed)
[facility] PROGRESS NOTE  Jeryn Bertoni OZH:[id number] DOB: 11/03/1950 DOA: 08/21/2013 PCP: No PCP Per Patient  HPI/Subjective:  On Ancef, WBCs still elevated, she has rash which is worsening. Discussed with Dr. Drue Second Friday, will switch back to Cubicin.  Interim Summary Patient is a pleasant 62 year old female with a past medical history of hypertension who was admitted to the medicine service on 08/21/2013, presenting with complaints of increased right leg swelling, pain that had been present for over a week. In the emergency department she underwent incision and drainage. She was started on empiric IV antibiotic therapy with vancomycin and Zosyn. She had an MRI of her right lower extremity performed on 08/22/2013 which did not show evidence of abscess, osteomyelitis, myositis or deep fasciitis. On 08/23/2013 patient's leg appeared worse with significant pain, erythema and purulence. I discussed case with Dr. Ninetta Lights of infectious disease who made the recommendation to discontinue vancomycin and Zosyn, starting Cubicin and clindamycin. Patient on the evening of 08/22/2013 developing a rash to vancomycin. Case was also discussed case with Dr. Simeon Craft of orthopedic surgery who evaluated patient on 08/23/2013 and recommended aggressive irrigation and debridement in the OR today.  Wound Cultures positive for MSSA.                                                                               Assessment/Plan:  Cellulitis/abscess of right lower extremity Patient presenting with clinical signs and symptoms consistent with cellulitis/abscess. Undergone incision and drainage in the emergency department. She was started on empiric IV antibiotic therapy with Zosyn and vancomycin.  Overnight she developed rash to vancomycin.  Was on Cubicin and Clindamycin, culture growing MSSA, will switch to Ancef. S/p debridement and placement of wound VAC, VAC removed.  Patient had another debridement today  showing still has active infection, still has leukocytosis. Discuss with Dr. Drue Second of twice a day, recommended to switch back to Cubicin. No need for rifampin.  Hypertension  Blood pressure is better controlled, continue amlodipine 10 mg by mouth daily.  Hypokalemia A.m. results after the administration of oral potassium   Pain management She is on morphine 1 mg IV every 4 hours as needed and Norco 5/325 one tablet every 6 hours as needed  Code Status: Full code Disposition Plan: Changing antibiotics today, discontinuing vancomycin and Zosyn starting Cubicin and clindamycin  Consults Orthopedic surgery  Procedures:  Incision and drainage of abscess performed in the emergency department on 08/21/2013  Antibiotics:  Zosyn (started on 08/21/2013 discontinued on 08/23/2013)  Vancomycin (started on 08/21/2013 discontinued on 08/23/2013)  Cubicin (started on 08/23/2013) D/C on 12/10  Clindamycin (started on 08/23/2013) D/C on 12/10  Ancef started on 12/10  Objective: Filed Vitals:   08/27/13 1011  BP: 170/78  Pulse: 102  Temp: 98.5 F (36.9 C)  Resp: 20    Intake/Output Summary (Last 24 hours) at 08/27/13 1309 Last data filed at 08/27/13 0830  Gross per 24 hour  Intake 1803.33 ml  Output    700 ml  Net 1103.33 ml   Filed Weights   08/21/13 1702 08/21/13 2058  Weight: 94.348 kg (208 lb) 95.029 kg (209 lb 8 oz)    Exam:   General:  No  acute distress she is awake alert and oriented x3  Cardiovascular: Regular rate rhythm normal S1 is  Respiratory: Lungs are clear to auscultation bilaterally no wheezing rhonchi or rales  Abdomen: Soft nontender nondistended  Musculoskeletal: Patient's leg appears worse and today, with purulent drainage over multiple areas in a 5 cm diameter region. There is increased erythema and significant pain to palpation over this region.   Data Reviewed: Basic Metabolic Panel:  Recent Labs Lab 08/21/13 1400 08/22/13 0710  08/23/13 0640 08/25/13 0650 08/26/13 0750  NA 136 137 138 137 139  K 4.3 3.3* 3.6 3.6 3.9  CL 101 103 103 103 102  CO2 24 25 23 23 25   GLUCOSE 91 101* 106* 109* 98  BUN 13 11 19 15 15   CREATININE 0.98 1.10 1.31* 1.03 1.05  CALCIUM 9.0 8.9 8.7 8.8 8.9   Liver Function Tests:  Recent Labs Lab 08/22/13 0710  AST 11  ALT 11  ALKPHOS 69  BILITOT 0.6  PROT 7.6  ALBUMIN 3.0*   No results found for this basename: LIPASE, AMYLASE,  in the last 168 hours No results found for this basename: AMMONIA,  in the last 168 hours CBC:  Recent Labs Lab 08/21/13 1400 08/21/13 2240 08/23/13 0640 08/24/13 0651 08/25/13 0650 08/26/13 0750 08/27/13 0455  WBC 14.4* 13.8* 13.3* 17.5* 15.9* 16.4* 15.9*  NEUTROABS  --  9.8*  --   --   --   --   --   HGB 13.8 13.2 12.9 12.4 12.3 12.0 11.8*  HCT 41.0 38.6 38.8 37.9 37.1 36.5 35.9*  MCV 85.2 84.3 85.8 86.3 86.3 85.1 86.1  PLT 362 384 394 401* 416* 427* 421*   Cardiac Enzymes: No results found for this basename: CKTOTAL, CKMB, CKMBINDEX, TROPONINI,  in the last 168 hours BNP (last 3 results) No results found for this basename: PROBNP,  in the last 8760 hours CBG:  Recent Labs Lab 08/27/13 0626  GLUCAP 101*    Recent Results (from the past 240 hour(s))  WOUND CULTURE     Status: None   Collection Time    08/21/13  8:43 PM      Result Value Range Status   Specimen Description WOUND   Final   Special Requests RIGHT LATERAL CALF PATIENT ON FOLLOWING VANCOMYCIN   Final   Gram Stain     Final   Value: NO WBC SEEN     NO SQUAMOUS EPITHELIAL CELLS SEEN     FEW GRAM POSITIVE COCCI IN PAIRS     Performed at [facility]   Culture     Final   Value: MODERATE STAPHYLOCOCCUS AUREUS     Note: RIFAMPIN AND GENTAMICIN SHOULD NOT BE USED AS SINGLE DRUGS FOR TREATMENT OF STAPH INFECTIONS. This organism DOES NOT demonstrate inducible Clindamycin resistance in vitro.     Performed at [facility]   Report Status 08/24/2013 FINAL    Final   Organism ID, Bacteria STAPHYLOCOCCUS AUREUS   Final  SURGICAL PCR SCREEN     Status: None   Collection Time    08/23/13  4:15 PM      Result Value Range Status   MRSA, PCR NEGATIVE  NEGATIVE Final   Staphylococcus aureus NEGATIVE  NEGATIVE Final   Comment:            The Xpert SA Assay (FDA     approved for NASAL specimens     in patients over 72 years of age),  is one component of     a comprehensive surveillance     program.  Test performance has     been validated by [facility] for patients greater     than or equal to 43 year old.     It is not intended     to diagnose infection nor to     guide or monitor treatment.     Studies: No results found.  Scheduled Meds: . amLODipine  10 mg Oral Daily  .  ceFAZolin (ANCEF) IV  2 g Intravenous Q8H  . heparin subcutaneous  5,000 Units Subcutaneous Q8H  . rifampin  300 mg Oral Q12H   Continuous Infusions: . sodium chloride 20 mL/hr at 08/27/13 1212    Principal Problem:   Cellulitis Active Problems:   OBSTRUCTIVE SLEEP APNEA   HTN (hypertension)    Time spent: 35 min    [facility] A  [facility] Pager [phone]. If 7PM-7AM, please contact night-coverage at [email or web address], password [facility] 08/27/2013, 1:09 PM  LOS: 6 days

## 2013-08-27 NOTE — Transfer of Care (Signed)
Immediate Anesthesia Transfer of Care Note  Patient: Lindsay [name]  Procedure(s) Performed: Procedure(s): REPEAT IRRIGATION AND DEBRIDEMENT OF RIGHT LEG WOUND (Right)  Patient Location: PACU  Anesthesia Type:General  Level of Consciousness: awake, alert , oriented and patient cooperative  Airway & Oxygen Therapy: Patient Spontanous Breathing and Patient connected to face mask oxygen  Post-op Assessment: Report given to PACU RN, Post -op Vital signs reviewed and stable and Patient moving all extremities  Post vital signs: Reviewed and stable  Complications: No apparent anesthesia complications

## 2013-08-27 NOTE — Brief Op Note (Signed)
08/21/2013 - 08/27/2013  8:20 AM  PATIENT:  Lindsay [name]  62 y.o. female  PRE-OPERATIVE DIAGNOSIS:  Right leg wound  POST-OPERATIVE DIAGNOSIS:  Right leg wound  PROCEDURE:  Procedure(s): REPEAT IRRIGATION AND DEBRIDEMENT OF RIGHT LEG WOUND (Right)  SURGEON:  Surgeon(s) and Role:    * Kathryne Hitch, MD - Primary  PHYSICIAN ASSISTANT: Rexene Edison, PA-C  ANESTHESIA:   general  EBL:   < 100 cc  BLOOD ADMINISTERED:none  DRAINS: none   LOCAL MEDICATIONS USED:  NONE  SPECIMEN:  No Specimen  DISPOSITION OF SPECIMEN:  N/A  COUNTS:  YES  TOURNIQUET:    DICTATION: .Other Dictation: Dictation Number [id number]  PLAN OF CARE: Admit to inpatient   PATIENT DISPOSITION:  PACU - hemodynamically stable.   Delay start of Pharmacological VTE agent (>24hrs) due to surgical blood loss or risk of bleeding: no

## 2013-08-28 LAB — CBC
HCT: 33.7 % — ABNORMAL LOW (ref 36.0–46.0)
Hemoglobin: 11.3 g/dL — ABNORMAL LOW (ref 12.0–15.0)
MCH: 28.4 pg (ref 26.0–34.0)
MCHC: 33.5 g/dL (ref 30.0–36.0)
MCV: 84.7 fL (ref 78.0–100.0)
Platelets: 445 10*3/uL — ABNORMAL HIGH (ref 150–400)
RBC: 3.98 MIL/uL (ref 3.87–5.11)
RDW: 14.4 % (ref 11.5–15.5)
WBC: 17 10*3/uL — ABNORMAL HIGH (ref 4.0–10.5)

## 2013-08-28 LAB — CK: Total CK: 65 U/L (ref 7–177)

## 2013-08-28 NOTE — Progress Notes (Signed)
Placed on CPAP at time requested. Patient comfortable and tolerating well

## 2013-08-28 NOTE — Op Note (Signed)
Lindsay [name], Lindsay [name] NO.:  [id number]  MEDICAL RECORD NO.:  [id number]  LOCATION:  [id number]                        FACILITY:  [facility]  PHYSICIAN:  Lindsay [name], M.D.DATE OF BIRTH:  09-03-1951  DATE OF PROCEDURE:  08/27/2013 DATE OF DISCHARGE:                              OPERATIVE REPORT   PREOPERATIVE DIAGNOSIS:  Right leg wound with known infection, status post irrigation and debridement x1, status post hydrotherapy x2.  POSTOPERATIVE DIAGNOSIS:  Continued infection of right leg with wound measuring minimum 10 cm x 8 cm.  FINDINGS:  Small pockets of gross purulence still around the edges of the wound in the subdermal tissue.  PROCEDURE:  Repeat irrigation and debridement including removal of skin and fascia sharply with a knife, right leg wound.  SURGEON:  Lindsay [name], M.D.  ASSISTANT:  Lindsay Canal, PA-C.  ANESTHESIA:  General.  BLOOD LOSS:  Less than 100 mL.  COMPLICATIONS:  None.  INDICATIONS:  [name] is a 62 year old female who came in the hospital last Sunday with an infection involving her right lower extremity on the anterolateral aspect.  She had that previously what she described as a bowl and this was lanced or opened up in the emergency room, and she was admitted to the Medicine Service and started on IV antibiotics.  Two and half days later, I was consulted due to the persistent high white count and found purulence undermining the skin and soft tissue around the wound.  I took her to the operating room on Tuesday evening, where she underwent a thorough irrigation and debridement.  We then treated with a VAC for granulation tissue and has had hydrotherapy.  Her white count has stayed persistent and I felt we needed to repeat I and D this area today.  I felt that if the wound looked good, we could potentially do the skin graft, but once we saw the wound and continued gross purulence, we decided not to do skin graft  it.  PROCEDURE DESCRIPTION:  After informed consent was obtained and appropriate right leg was marked, she was brought to the operating room and placed supine on the operating table and general anesthesia was obtained.  We first took down her dressing that she had up at the thigh wound and she did have a small bowl at the thigh that was draining purulence.  We expressed all the purulence out of this and did not find any further purulence.  We placed the Xeroform and a well-padded sterile dressing on this.  We then took down her wound and dressing on her leg and found that areas are still gross purulence and tracking anterior and medial.  We cleaned the leg with Betadine scrub and paint.  Time-out was called and she was identified as correct patient and correct right leg. We then used 5 liters of normal saline solution.  We pulsatile lavaged through the wound and still saw good bleeding and granulation tissue.  I used a knife to remove the edges of tissue that were necrotic appearing and we could undermined to release any further gross purulence that we found.  Once this was completed, we placed a large sheet of Xeroform over  the wound followed by well-padded sterile dressing.  She was awakened, extubated and taken to the recovery room in stable condition. All final counts were correct and no complications noted.  We will change her dressing tomorrow, likely have additional hydrotherapy on Monday and then return to the OR on Tuesday for possible skin grafting at that point, when the infection was cleared.     Lindsay [name], M.D.     CYB/MEDQ  D:  08/27/2013  T:  08/28/2013  Job:  [id number]

## 2013-08-28 NOTE — Progress Notes (Signed)
Patient ID: Lindsay [name], female   DOB: [birth year], 62 y.o.   MRN: [id number] On return to the OR yesterday, still found a few small areas of gross purulence at the right leg wound margins and thus, had to repeat the I&D only.  I changed the dressing at the bedside today and the wound looks much better with no gross purulence.  She will definitely be a good candidate for a skin graft likely tomorrow evening vs late Tuesday.

## 2013-08-28 NOTE — Progress Notes (Signed)
Patient not ready for CPAP at this time. Stated they would be ready at 2330. Explained that I or someone from [facility] would return as close that time as possible and if she changed their minds to just have the RN call us.

## 2013-08-28 NOTE — Progress Notes (Signed)
[facility] PROGRESS NOTE  Lindsay [name] UXL:[id number] DOB: [birth year] DOA: 08/21/2013 PCP: No PCP Per Patient  HPI/Subjective:  Feels okay, had temperature of 100.2 last night. Back on Cubicin, 8 mg per kilogram. Appreciate ID help. Patient likely for skin graft in the morning. If the skin graft needs to be delayed, patient can have antibiotics for several days as outpatient and comes back for the graft.  Interim Summary Patient is a pleasant 62 year old female with a past medical history of hypertension who was admitted to the medicine service on 08/21/2013, presenting with complaints of increased right leg swelling, pain that had been present for over a week. In the emergency department she underwent incision and drainage. She was started on empiric IV antibiotic therapy with vancomycin and Zosyn. She had an MRI of her right lower extremity performed on 08/22/2013 which did not show evidence of abscess, osteomyelitis, myositis or deep fasciitis. On 08/23/2013 patient's leg appeared worse with significant pain, erythema and purulence. I discussed case with Dr. Ninetta Lights of infectious disease who made the recommendation to discontinue vancomycin and Zosyn, starting Cubicin and clindamycin. Patient on the evening of 08/22/2013 developing a rash to vancomycin. Case was also discussed case with Dr. Simeon Craft of orthopedic surgery who evaluated patient on 08/23/2013 and recommended aggressive irrigation and debridement in the OR today.  Wound Cultures positive for MSSA.                                                                               Assessment/Plan:  Cellulitis/abscess of right lower extremity Patient presenting with clinical signs and symptoms consistent with cellulitis/abscess. Undergone incision and drainage in the emergency department. She was started on empiric IV antibiotic therapy with Zosyn and vancomycin.  Overnight she developed rash to vancomycin.  Was on Cubicin and  Clindamycin, culture growing MSSA, will switch to Ancef. S/p debridement and placement of wound VAC, VAC removed.  Patient had another debridement today showing still has active infection, still has leukocytosis. Discuss with Dr. Drue Second, recommended to switch back to Cubicin. No need for rifampin.  Hypertension  Blood pressure is better controlled, continue amlodipine 10 mg by mouth daily.  Hypokalemia A.m. results after the administration of oral potassium   Pain management She is on morphine 1 mg IV every 4 hours as needed and Norco 5/325 one tablet every 6 hours as needed  Code Status: Full code Disposition Plan: Changing antibiotics today, discontinuing vancomycin and Zosyn starting Cubicin and clindamycin  Consults Orthopedic surgery ID  Procedures:  Incision and drainage of abscess performed in the emergency department on 08/21/2013  Antibiotics:  Zosyn (started on 08/21/2013 discontinued on 08/23/2013)  Vancomycin (started on 08/21/2013 discontinued on 08/23/2013)  Cubicin (started on 08/23/2013) D/C on 12/10  Clindamycin (started on 08/23/2013) D/C on 12/10  Ancef started on 12/10  Objective: Filed Vitals:   08/28/13 0554  BP: 121/72  Pulse: 93  Temp: 98.5 F (36.9 C)  Resp: 18    Intake/Output Summary (Last 24 hours) at 08/28/13 0922 Last data filed at 08/28/13 0855  Gross per 24 hour  Intake 1438.54 ml  Output    250 ml  Net 1188.54 ml   [employer]  08/21/13 1702 08/21/13 2058  Weight: 94.348 kg (208 lb) 95.029 kg (209 lb 8 oz)    Exam:   General:  No acute distress she is awake alert and oriented x3  Cardiovascular: Regular rate rhythm normal S1 is  Respiratory: Lungs are clear to auscultation bilaterally no wheezing rhonchi or rales  Abdomen: Soft nontender nondistended  Musculoskeletal: Patient's leg appears worse and today, with purulent drainage over multiple areas in a 5 cm diameter region. There is increased erythema and  significant pain to palpation over this region.   Data Reviewed: Basic Metabolic Panel:  Recent Labs Lab 08/21/13 1400 08/22/13 0710 08/23/13 0640 08/25/13 0650 08/26/13 0750  NA 136 137 138 137 139  K 4.3 3.3* 3.6 3.6 3.9  CL 101 103 103 103 102  CO2 24 25 23 23 25   GLUCOSE 91 101* 106* 109* 98  BUN 13 11 19 15 15   CREATININE 0.98 1.10 1.31* 1.03 1.05  CALCIUM 9.0 8.9 8.7 8.8 8.9   Liver Function Tests:  Recent Labs Lab 08/22/13 0710  AST 11  ALT 11  ALKPHOS 69  BILITOT 0.6  PROT 7.6  ALBUMIN 3.0*   No results found for this basename: LIPASE, AMYLASE,  in the last 168 hours No results found for this basename: AMMONIA,  in the last 168 hours CBC:  Recent Labs Lab 08/21/13 1400 08/21/13 2240 08/23/13 0640 08/24/13 0651 08/25/13 0650 08/26/13 0750 08/27/13 0455  WBC 14.4* 13.8* 13.3* 17.5* 15.9* 16.4* 15.9*  NEUTROABS  --  9.8*  --   --   --   --  13.4*  HGB 13.8 13.2 12.9 12.4 12.3 12.0 11.8*  HCT 41.0 38.6 38.8 37.9 37.1 36.5 35.9*  MCV 85.2 84.3 85.8 86.3 86.3 85.1 86.1  PLT 362 384 394 401* 416* 427* 421*   Cardiac Enzymes:  Recent Labs Lab 08/28/13 0416  CKTOTAL 65   BNP (last 3 results) No results found for this basename: PROBNP,  in the last 8760 hours CBG:  Recent Labs Lab 08/27/13 0626  GLUCAP 101*    Recent Results (from the past 240 hour(s))  WOUND CULTURE     Status: None   Collection Time    08/21/13  8:43 PM      Result Value Range Status   Specimen Description WOUND   Final   Special Requests RIGHT LATERAL CALF PATIENT ON FOLLOWING VANCOMYCIN   Final   Gram Stain     Final   Value: NO WBC SEEN     NO SQUAMOUS EPITHELIAL CELLS SEEN     FEW GRAM POSITIVE COCCI IN PAIRS     Performed at [facility]   Culture     Final   Value: MODERATE STAPHYLOCOCCUS AUREUS     Note: RIFAMPIN AND GENTAMICIN SHOULD NOT BE USED AS SINGLE DRUGS FOR TREATMENT OF STAPH INFECTIONS. This organism DOES NOT demonstrate inducible Clindamycin  resistance in vitro.     Performed at [facility]   Report Status 08/24/2013 FINAL   Final   Organism ID, Bacteria STAPHYLOCOCCUS AUREUS   Final  SURGICAL PCR SCREEN     Status: None   Collection Time    08/23/13  4:15 PM      Result Value Range Status   MRSA, PCR NEGATIVE  NEGATIVE Final   Staphylococcus aureus NEGATIVE  NEGATIVE Final   Comment:            The Xpert SA Assay (FDA     approved  for NASAL specimens     in patients over 27 years of age),     is one component of     a comprehensive surveillance     program.  Test performance has     been validated by [employer] for patients greater     than or equal to 71 year old.     It is not intended     to diagnose infection nor to     guide or monitor treatment.     Studies: No results found.  Scheduled Meds: . amLODipine  10 mg Oral Daily  . DAPTOmycin (CUBICIN)  IV  8 mg/kg Intravenous Q24H  . heparin subcutaneous  5,000 Units Subcutaneous Q8H   Continuous Infusions: . sodium chloride 20 mL/hr at 08/27/13 1212    Principal Problem:   Cellulitis Active Problems:   OBSTRUCTIVE SLEEP APNEA   HTN (hypertension)    Time spent: 35 min    [facility] A  [facility] Pager [phone]. If 7PM-7AM, please contact night-coverage at [email or web address], password [facility] 08/28/2013, 9:22 AM  LOS: 7 days

## 2013-08-29 LAB — BASIC METABOLIC PANEL
BUN: 11 mg/dL (ref 6–23)
CO2: 24 mEq/L (ref 19–32)
Calcium: 8.5 mg/dL (ref 8.4–10.5)
Chloride: 104 mEq/L (ref 96–112)
Creatinine, Ser: 0.89 mg/dL (ref 0.50–1.10)
GFR calc Af Amer: 80 mL/min — ABNORMAL LOW (ref >90)
GFR calc non Af Amer: 69 mL/min — ABNORMAL LOW (ref >90)
Glucose, Bld: 102 mg/dL — ABNORMAL HIGH (ref 70–99)
Potassium: 3.6 mEq/L (ref 3.5–5.1)
Sodium: 140 mEq/L (ref 135–145)

## 2013-08-29 LAB — CBC
HCT: 34 % — ABNORMAL LOW (ref 36.0–46.0)
Hemoglobin: 11.1 g/dL — ABNORMAL LOW (ref 12.0–15.0)
MCH: 28.1 pg (ref 26.0–34.0)
MCHC: 32.6 g/dL (ref 30.0–36.0)
MCV: 86.1 fL (ref 78.0–100.0)
Platelets: 440 10*3/uL — ABNORMAL HIGH (ref 150–400)
RBC: 3.95 MIL/uL (ref 3.87–5.11)
RDW: 14.3 % (ref 11.5–15.5)
WBC: 14.9 10*3/uL — ABNORMAL HIGH (ref 4.0–10.5)

## 2013-08-29 MED ORDER — POTASSIUM CHLORIDE CRYS ER 20 MEQ PO TBCR
40.0000 meq | EXTENDED_RELEASE_TABLET | Freq: Once | ORAL | Status: AC
  Administered 2013-08-29: 40 meq via ORAL
  Filled 2013-08-29: qty 2

## 2013-08-29 NOTE — Progress Notes (Signed)
Patient wants therapist to return at 2330 to place patient on CPAP for the night

## 2013-08-29 NOTE — Progress Notes (Signed)
Physical Therapy Wound Treatment Patient Details  Name: Lindsay [name] MRN: [id number] Date of Birth: 10/18/1950  Today's Date: 08/29/2013 Time: 0930-1008 Time Calculation (min): 38 min  Subjective  Subjective: Pt stating she will have skin graft tomorrow.  Pain Score: Pain Score: Severe pain with hydrotherapy and dressing change.  Premedicated.  Pain greatly decr once dressing reapplied.  Wound Assessment  Wound 08/21/13 Other (Comment) Leg Right;Lateral  5 cm diameter open blister (Active)  Site / Wound Assessment Red;Yellow;Painful 08/29/2013 10:30 AM  % Wound base Red or Granulating 95% 08/29/2013 10:30 AM  % Wound base Yellow 5% 08/29/2013 10:30 AM  % Wound base Black 0% 08/29/2013 10:30 AM  % Wound base Other (Comment) 0% 08/29/2013 10:30 AM  Peri-wound Assessment Edema 08/29/2013 10:30 AM  Wound Length (cm) 10 cm 08/29/2013 10:30 AM  Wound Width (cm) 13 cm 08/29/2013 10:30 AM  Wound Depth (cm) 0.2 cm 08/29/2013 10:30 AM  Margins Unattacted edges (unapproximated) 08/29/2013 10:30 AM  Closure None 08/29/2013 10:30 AM  Drainage Amount Moderate 08/29/2013 10:30 AM  Drainage Description Serosanguineous 08/29/2013 10:30 AM  Treatment Hydrotherapy (Pulse lavage) 08/29/2013 10:30 AM  Dressing Type Impregnated gauze (bismuth);ABD;Gauze (Comment);Compression wrap 08/29/2013 10:30 AM  Dressing Changed Changed 08/29/2013 10:30 AM  Dressing Status Clean 08/29/2013 10:30 AM      Hydrotherapy Pulsed lavage therapy - wound location: Rt anterolateral tibial surface Pulsed Lavage with Suction (psi): 4 psi Pulsed Lavage with Suction - Normal Saline Used: Other (comment) (700) Pulsed Lavage Tip: Tip with splash shield   Wound Assessment and Plan  Wound Therapy - Assess/Plan/Recommendations Wound Therapy - Clinical Statement: Drainage and periwound edema improved.  Pt hopeful for skin graft tomorrow. Wound Therapy - Functional Problem List: Decr mobility due to painful leg wound. Factors  Delaying/Impairing Wound Healing: Infection - systemic/local Wound Therapy - Current Recommendations: PT Wound Therapy - Follow Up Recommendations: Home health RN Wound Plan: Pt for possible skin graft tomorrow. Will await any further hydrotherapy orders.  Wound Therapy Goals- Improve the function of patient's integumentary system by progressing the wound(s) through the phases of wound healing (inflammation - proliferation - remodeling) by: Decrease Necrotic Tissue - Progress: Progressing toward goal Increase Granulation Tissue - Progress: Progressing toward goal  Goals will be updated until maximal potential achieved or discharge criteria met.  Discharge criteria: when goals achieved, discharge from hospital, MD decision/surgical intervention, no progress towards goals, refusal/missing three consecutive treatments without notification or medical reason.  GP     Thresa Dozier 08/29/2013, 10:36 AM  Skip Mayer PT [phone]

## 2013-08-29 NOTE — Evaluation (Signed)
Physical Therapy Evaluation Patient Details Name: Lindsay [name] MRN: [id number] DOB: 01/06/1951 Today's Date: 08/29/2013 Time: 4098-1191 PT Time Calculation (min): 30 min  PT Assessment / Plan / Recommendation History of Present Illness  Pt adm with infection to rt lower leg and has undergone multiple I&D's.  Pt for possible skin graft 08/30/13.  Clinical Impression  Pt admitted with above. Pt currently with functional limitations due to the deficits listed below (see PT Problem List).   Pt will benefit from skilled PT to increase their independence and safety with mobility to allow discharge to the venue listed below. Will need to reassess dc plan after skin graft especially if pt's wt bearing on rt leg is limited.      PT Assessment  Patient needs continued PT services    Follow Up Recommendations  Home health PT;Supervision - Intermittent    Does the patient have the potential to tolerate intense rehabilitation      Barriers to Discharge        Equipment Recommendations  Rolling walker with 5" wheels;Wheelchair (measurements PT) (Will further assess equipment needs after surgery)    Recommendations for Other Services     Frequency Min 3X/week    Precautions / Restrictions Precautions Precautions: None   Pertinent Vitals/Pain Pain incr with initial dependent position but then lessened.      Mobility  Bed Mobility Bed Mobility: Supine to Sit;Sitting - Scoot to Edge of Bed Supine to Sit: 6: Modified independent (Device/Increase time);HOB elevated Sitting - Scoot to Edge of Bed: 6: Modified independent (Device/Increase time) Details for Bed Mobility Assistance: incr time Transfers Transfers: Sit to Stand;Stand to Sit Sit to Stand: 4: Min assist;With upper extremity assist;From bed;From toilet Stand to Sit: 4: Min assist;With upper extremity assist;To toilet;With armrests;To chair/3-in-1 Details for Transfer Assistance: Verbal cues for hand  placement. Ambulation/Gait Ambulation/Gait Assistance: 4: Min assist Ambulation Distance (Feet): 12 Feet (x 2) Assistive device: Rolling walker Ambulation/Gait Assistance Details: Verbal cues to stand more erect and stay closer to walker.  Cues to keep both hands on walker. Gait Pattern: Step-to pattern;Decreased step length - left;Decreased stance time - right;Antalgic;Trunk flexed Gait velocity: slow    Exercises     PT Diagnosis: Difficulty walking;Acute pain  PT Problem List: Decreased activity tolerance;Decreased mobility;Decreased knowledge of use of DME;Pain PT Treatment Interventions: DME instruction;Gait training;Functional mobility training;Therapeutic activities;Patient/family education     PT Goals(Current goals can be found in the care plan section) Acute Rehab PT Goals Patient Stated Goal: return home PT Goal Formulation: With patient Time For Goal Achievement: 09/05/13 Potential to Achieve Goals: Good  Visit Information  Last PT Received On: 08/29/13 Assistance Needed: +1 History of Present Illness: Pt adm with infection to rt lower leg and has undergone multiple I&D's.  Pt for possible skin graft 08/30/13.       Prior Functioning  Home Living Family/patient expects to be discharged to:: Private residence Living Arrangements: Alone Available Help at Discharge: Family;Available PRN/intermittently Type of Home: House Home Access: Stairs to enter [employer] of Steps: 4 Entrance Stairs-Rails: Right Home Layout: One level Home Equipment: None Prior Function Level of Independence: Independent Communication Communication: No difficulties    Cognition  Cognition Arousal/Alertness: Awake/alert Behavior During Therapy: WFL for tasks assessed/performed Overall Cognitive Status: Within Functional Limits for tasks assessed    Extremity/Trunk Assessment Upper Extremity Assessment Upper Extremity Assessment: Overall WFL for tasks assessed Lower  Extremity Assessment Lower Extremity Assessment: RLE deficits/detail RLE Deficits / Details: Active movement against gravity  but decr function due to pain. RLE: Unable to fully assess due to pain   Balance Balance Balance Assessed: Yes Static Standing Balance Static Standing - Balance Support: Bilateral upper extremity supported Static Standing - Level of Assistance: 5: Stand by assistance  End of Session PT - End of Session Activity Tolerance: Patient limited by pain Patient left: in chair;with call bell/phone within reach Nurse Communication: Mobility status  GP     [facility] 08/29/2013, 3:38 PM  [facility] PT [phone]

## 2013-08-29 NOTE — Progress Notes (Signed)
called to check patient CPAP . Changed mask from large to medium. She feels better and is tolerating ok. No leaks noted.

## 2013-08-29 NOTE — Progress Notes (Signed)
Patient ID: Lindsay [name], female   DOB: July 29, 1951, 62 y.o.   MRN: [id number] Plan is for hydrotherapy today by PT Wound Service then return to the OR late tomorrow for skin grafting.  The wound should finally be ready at that time for grafting.

## 2013-08-29 NOTE — Progress Notes (Signed)
[facility] PROGRESS NOTE  Lindsay [name] GNF:[id number] DOB: 1951-03-21 DOA: 08/21/2013 PCP: No PCP Per Patient  HPI/Subjective:  Feels much better, had temperature of 99.5 last night. Back on Cubicin, 8 mg per kilogram. Appreciate ID help. Patient likely for skin graft in the morning.  Interim Summary Patient is a pleasant 62 year old female with a past medical history of hypertension who was admitted to the medicine service on 08/21/2013, presenting with complaints of increased right leg swelling, pain that had been present for over a week. In the emergency department she underwent incision and drainage. She was started on empiric IV antibiotic therapy with vancomycin and Zosyn. She had an MRI of her right lower extremity performed on 08/22/2013 which did not show evidence of abscess, osteomyelitis, myositis or deep fasciitis. On 08/23/2013 patient's leg appeared worse with significant pain, erythema and purulence. I discussed case with Dr. Ninetta Lights of infectious disease who made the recommendation to discontinue vancomycin and Zosyn, starting Cubicin and clindamycin. Patient on the evening of 08/22/2013 developing a rash to vancomycin. Case was also discussed case with Dr. Simeon Craft of orthopedic surgery who evaluated patient on 08/23/2013 and recommended aggressive irrigation and debridement in the OR today.  Wound Cultures positive for MSSA.                                                                               Assessment/Plan:  Cellulitis/abscess of right lower extremity Patient presenting with clinical signs and symptoms consistent with cellulitis/abscess. Undergone incision and drainage in the emergency department. She was started on empiric IV antibiotic therapy with Zosyn and vancomycin.  Overnight she developed rash to vancomycin.  Was on Cubicin and Clindamycin, culture growing MSSA, will switch to Ancef. S/p debridement and placement of wound VAC, VAC removed.  Status  post debridement x2, she did ago for skin grafting a.m. Ulcer seen while patient getting hydrotherapy, no evidence of pus, base is healthy granulation tissues.  Hypertension  Blood pressure is better controlled, continue amlodipine 10 mg by mouth daily.  Hypokalemia A.m. results after the administration of oral potassium   Pain management She is on morphine 1 mg IV every 4 hours as needed and Norco 5/325 one tablet every 6 hours as needed  Code Status: Full code Disposition Plan: Changing antibiotics today, discontinuing vancomycin and Zosyn starting Cubicin and clindamycin  Consults Orthopedic surgery ID  Procedures:  Incision and drainage of abscess performed in the emergency department on 08/21/2013  Antibiotics:  Zosyn (started on 08/21/2013 discontinued on 08/23/2013)  Vancomycin (started on 08/21/2013 discontinued on 08/23/2013)  Cubicin (started on 08/23/2013) D/C on 12/10  Clindamycin (started on 08/23/2013) D/C on 12/10  Ancef started on 12/10 discontinued  Cubicin restarted on 12/13  Objective: Filed Vitals:   08/29/13 0528  BP: 153/77  Pulse: 92  Temp: 98.7 F (37.1 C)  Resp: 20    Intake/Output Summary (Last 24 hours) at 08/29/13 1004 Last data filed at 08/29/13 0859  Gross per 24 hour  Intake 1373.53 ml  Output    850 ml  Net 523.53 ml   Filed Weights   08/21/13 1702 08/21/13 2058  Weight: 94.348 kg (208 lb) 95.029 kg (209 lb 8 oz)  Exam:   General:  No acute distress she is awake alert and oriented x3  Cardiovascular: Regular rate rhythm normal S1 is  Respiratory: Lungs are clear to auscultation bilaterally no wheezing rhonchi or rales  Abdomen: Soft nontender nondistended  Musculoskeletal: Patient's leg appears worse and today, with purulent drainage over multiple areas in a 5 cm diameter region. There is increased erythema and significant pain to palpation over this region.   Data Reviewed: Basic Metabolic Panel:  Recent  Labs Lab 08/23/13 0640 08/25/13 0650 08/26/13 0750 08/29/13 0545  NA 138 137 139 140  K 3.6 3.6 3.9 3.6  CL 103 103 102 104  CO2 23 23 25 24   GLUCOSE 106* 109* 98 102*  BUN 19 15 15 11   CREATININE 1.31* 1.03 1.05 0.89  CALCIUM 8.7 8.8 8.9 8.5   Liver Function Tests: No results found for this basename: AST, ALT, ALKPHOS, BILITOT, PROT, ALBUMIN,  in the last 168 hours No results found for this basename: LIPASE, AMYLASE,  in the last 168 hours No results found for this basename: AMMONIA,  in the last 168 hours CBC:  Recent Labs Lab 08/25/13 0650 08/26/13 0750 08/27/13 0455 08/28/13 0925 08/29/13 0545  WBC 15.9* 16.4* 15.9* 17.0* 14.9*  NEUTROABS  --   --  13.4*  --   --   HGB 12.3 12.0 11.8* 11.3* 11.1*  HCT 37.1 36.5 35.9* 33.7* 34.0*  MCV 86.3 85.1 86.1 84.7 86.1  PLT 416* 427* 421* 445* 440*   Cardiac Enzymes:  Recent Labs Lab 08/28/13 0416  CKTOTAL 65   BNP (last 3 results) No results found for this basename: PROBNP,  in the last 8760 hours CBG:  Recent Labs Lab 08/27/13 0626  GLUCAP 101*    Recent Results (from the past 240 hour(s))  WOUND CULTURE     Status: None   Collection Time    08/21/13  8:43 PM      Result Value Range Status   Specimen Description WOUND   Final   Special Requests RIGHT LATERAL CALF PATIENT ON FOLLOWING VANCOMYCIN   Final   Gram Stain     Final   Value: NO WBC SEEN     NO SQUAMOUS EPITHELIAL CELLS SEEN     FEW GRAM POSITIVE COCCI IN PAIRS     Performed at [facility]   Culture     Final   Value: MODERATE STAPHYLOCOCCUS AUREUS     Note: RIFAMPIN AND GENTAMICIN SHOULD NOT BE USED AS SINGLE DRUGS FOR TREATMENT OF STAPH INFECTIONS. This organism DOES NOT demonstrate inducible Clindamycin resistance in vitro.     Performed at [facility]   Report Status 08/24/2013 FINAL   Final   Organism ID, Bacteria STAPHYLOCOCCUS AUREUS   Final  SURGICAL PCR SCREEN     Status: None   Collection Time    08/23/13  4:15 PM       Result Value Range Status   MRSA, PCR NEGATIVE  NEGATIVE Final   Staphylococcus aureus NEGATIVE  NEGATIVE Final   Comment:            The Xpert SA Assay (FDA     approved for NASAL specimens     in patients over 31 years of age),     is one component of     a comprehensive surveillance     program.  Test performance has     been validated by [employer] for patients greater  than or equal to 70 year old.     It is not intended     to diagnose infection nor to     guide or monitor treatment.     Studies: No results found.  Scheduled Meds: . amLODipine  10 mg Oral Daily  . DAPTOmycin (CUBICIN)  IV  8 mg/kg Intravenous Q24H  . heparin subcutaneous  5,000 Units Subcutaneous Q8H   Continuous Infusions: . sodium chloride 20 mL/hr at 08/27/13 1212    Principal Problem:   Cellulitis Active Problems:   OBSTRUCTIVE SLEEP APNEA   HTN (hypertension)    Time spent: 35 min    [facility]  [facility] Pager [phone]. If 7PM-7AM, please contact night-coverage at [email or web address], password [facility] 08/29/2013, 10:04 AM  LOS: 8 days

## 2013-08-29 NOTE — Progress Notes (Signed)
INFECTIOUS DISEASE PROGRESS NOTE  ID: Lindsay [name] is a 62 y.o. female with  Principal Problem:   Cellulitis Active Problems:   OBSTRUCTIVE SLEEP APNEA   HTN (hypertension)  Subjective: Without complaints. No problems with anbx  Abtx:  Anti-infectives   Start     Dose/Rate Route Frequency Ordered Stop   08/27/13 1500  DAPTOmycin (CUBICIN) 760 mg in sodium chloride 0.9 % IVPB     8 mg/kg  95 kg 230.4 mL/hr over 30 Minutes Intravenous Every 24 hours 08/27/13 1317     08/27/13 1015  rifampin (RIFADIN) capsule 300 mg  Status:  Discontinued     300 mg Oral Every 12 hours 08/27/13 1006 08/27/13 1317   08/24/13 1400  ceFAZolin (ANCEF) IVPB 2 g/50 mL premix  Status:  Discontinued     2 g 100 mL/hr over 30 Minutes Intravenous 3 times per day 08/24/13 1101 08/27/13 1317   08/23/13 0945  DAPTOmycin (CUBICIN) 760 mg in sodium chloride 0.9 % IVPB  Status:  Discontinued     8 mg/kg  95 kg 230.4 mL/hr over 30 Minutes Intravenous Every 24 hours 08/23/13 0936 08/23/13 0937   08/23/13 0945  DAPTOmycin (CUBICIN) 570 mg in sodium chloride 0.9 % IVPB  Status:  Discontinued     6 mg/kg  95 kg 222.8 mL/hr over 30 Minutes Intravenous Every 24 hours 08/23/13 0937 08/24/13 1055   08/23/13 0930  clindamycin (CLEOCIN) IVPB 600 mg  Status:  Discontinued     600 mg 100 mL/hr over 30 Minutes Intravenous 3 times per day 08/23/13 0922 08/24/13 1055   08/22/13 0600  vancomycin (VANCOCIN) IVPB 1000 mg/200 mL premix  Status:  Discontinued     1,000 mg 200 mL/hr over 60 Minutes Intravenous Every 12 hours 08/21/13 2128 08/23/13 0923   08/21/13 2230  piperacillin-tazobactam (ZOSYN) IVPB 3.375 g  Status:  Discontinued     3.375 g 12.5 mL/hr over 240 Minutes Intravenous 3 times per day 08/21/13 2129 08/23/13 0923   08/21/13 1715  vancomycin (VANCOCIN) 1,750 mg in sodium chloride 0.9 % 500 mL IVPB     1,750 mg 250 mL/hr over 120 Minutes Intravenous STAT 08/21/13 1711 08/21/13 1924   08/21/13 1645  vancomycin  (VANCOCIN) 20 mg/kg in sodium chloride 0.9 % 100 mL IVPB  Status:  Discontinued     20 mg/kg 100 mL/hr over 60 Minutes Intravenous  Once 08/21/13 1630 08/21/13 1712      Medications:  Scheduled: . amLODipine  10 mg Oral Daily  . DAPTOmycin (CUBICIN)  IV  8 mg/kg Intravenous Q24H  . heparin subcutaneous  5,000 Units Subcutaneous Q8H    Objective: Vital signs in last 24 hours: Temp:  [98.5 F (36.9 C)-99.5 F (37.5 C)] 98.7 F (37.1 C) (12/15 0528) Pulse Rate:  [92-107] 92 (12/15 0528) Resp:  [18-20] 20 (12/15 0528) BP: (143-165)/(77) 153/77 mmHg (12/15 0528) SpO2:  [96 %-99 %] 98 % (12/15 0528)   General appearance: alert, cooperative and no distress Extremities: R LE wrapped. normal light touch in foot  Lab Results  Recent Labs  08/28/13 0925 08/29/13 0545  WBC 17.0* 14.9*  HGB 11.3* 11.1*  HCT 33.7* 34.0*  NA  --  140  K  --  3.6  CL  --  104  CO2  --  24  BUN  --  11  CREATININE  --  0.89   Liver Panel No results found for this basename: PROT, ALBUMIN, AST, ALT, ALKPHOS, BILITOT, BILIDIR, IBILI,  in the last 72 hours Sedimentation Rate No results found for this basename: ESRSEDRATE,  in the last 72 hours C-Reactive Protein No results found for this basename: CRP,  in the last 72 hours  Microbiology: Recent Results (from the past 240 hour(s))  WOUND CULTURE     Status: None   Collection Time    08/21/13  8:43 PM      Result Value Range Status   Specimen Description WOUND   Final   Special Requests RIGHT LATERAL CALF PATIENT ON FOLLOWING VANCOMYCIN   Final   Gram Stain     Final   Value: NO WBC SEEN     NO SQUAMOUS EPITHELIAL CELLS SEEN     FEW GRAM POSITIVE COCCI IN PAIRS     Performed at [facility]   Culture     Final   Value: MODERATE STAPHYLOCOCCUS AUREUS     Note: RIFAMPIN AND GENTAMICIN SHOULD NOT BE USED AS SINGLE DRUGS FOR TREATMENT OF STAPH INFECTIONS. This organism DOES NOT demonstrate inducible Clindamycin resistance in vitro.      Performed at [facility]   Report Status 08/24/2013 FINAL   Final   Organism ID, Bacteria STAPHYLOCOCCUS AUREUS   Final  SURGICAL PCR SCREEN     Status: None   Collection Time    08/23/13  4:15 PM      Result Value Range Status   MRSA, PCR NEGATIVE  NEGATIVE Final   Staphylococcus aureus NEGATIVE  NEGATIVE Final   Comment:            The Xpert SA Assay (FDA     approved for NASAL specimens     in patients over 7 years of age),     is one component of     a comprehensive surveillance     program.  Test performance has     been validated by [employer] for patients greater     than or equal to 60 year old.     It is not intended     to diagnose infection nor to     guide or monitor treatment.    Studies/Results: No results found.   Assessment/Plan: Cellulitis, abscess RLE  MSSA I & D 12-9, 12-14  Plan for skin grafting tomorrow Check HIV per cdc guidelines CK normal yesterday  Total days of antibiotics: 6 (dapto)          Lindsay [name] Infectious Diseases (pager) [phone] [email or web address] 08/29/2013, 1:51 PM  LOS: 8 days

## 2013-08-30 ENCOUNTER — Encounter (HOSPITAL_COMMUNITY): Payer: Self-pay | Admitting: Orthopaedic Surgery

## 2013-08-30 ENCOUNTER — Inpatient Hospital Stay (HOSPITAL_COMMUNITY): Payer: 59 | Admitting: Anesthesiology

## 2013-08-30 ENCOUNTER — Encounter (HOSPITAL_COMMUNITY): Payer: 59 | Admitting: Anesthesiology

## 2013-08-30 ENCOUNTER — Encounter (HOSPITAL_COMMUNITY)
Admission: EM | Disposition: A | Discharge status: Home / Self Care | Payer: Self-pay | Source: Home / Self Care | Attending: Internal Medicine

## 2013-08-30 DIAGNOSIS — Z888 Allergy status to other drugs, medicaments and biological substances status: Secondary | ICD-10-CM

## 2013-08-30 HISTORY — PX: I & D EXTREMITY: SHX5045

## 2013-08-30 LAB — BASIC METABOLIC PANEL
BUN: 10 mg/dL (ref 6–23)
CO2: 22 mEq/L (ref 19–32)
Calcium: 8.6 mg/dL (ref 8.4–10.5)
Chloride: 104 mEq/L (ref 96–112)
Creatinine, Ser: 0.87 mg/dL (ref 0.50–1.10)
GFR calc Af Amer: 82 mL/min — ABNORMAL LOW (ref >90)
GFR calc non Af Amer: 71 mL/min — ABNORMAL LOW (ref >90)
Glucose, Bld: 96 mg/dL (ref 70–99)
Potassium: 3.8 mEq/L (ref 3.5–5.1)
Sodium: 137 mEq/L (ref 135–145)

## 2013-08-30 LAB — CBC
HCT: 33 % — ABNORMAL LOW (ref 36.0–46.0)
Hemoglobin: 10.7 g/dL — ABNORMAL LOW (ref 12.0–15.0)
MCH: 28.1 pg (ref 26.0–34.0)
MCHC: 32.4 g/dL (ref 30.0–36.0)
MCV: 86.6 fL (ref 78.0–100.0)
Platelets: 429 10*3/uL — ABNORMAL HIGH (ref 150–400)
RBC: 3.81 MIL/uL — ABNORMAL LOW (ref 3.87–5.11)
RDW: 14.4 % (ref 11.5–15.5)
WBC: 16.5 10*3/uL — ABNORMAL HIGH (ref 4.0–10.5)

## 2013-08-30 LAB — SURGICAL PCR SCREEN
MRSA, PCR: NEGATIVE
Staphylococcus aureus: NEGATIVE

## 2013-08-30 LAB — HIV ANTIBODY (ROUTINE TESTING W REFLEX): HIV: NONREACTIVE

## 2013-08-30 SURGERY — IRRIGATION AND DEBRIDEMENT EXTREMITY
Anesthesia: General | Site: Leg Lower | Laterality: Right

## 2013-08-30 MED ORDER — MIDAZOLAM HCL 5 MG/5ML IJ SOLN
INTRAMUSCULAR | Status: DC | PRN
  Administered 2013-08-30: 2 mg via INTRAVENOUS

## 2013-08-30 MED ORDER — ONDANSETRON HCL 4 MG/2ML IJ SOLN: 4.0000 mg | Freq: Once | INTRAMUSCULAR | Status: DC | PRN

## 2013-08-30 MED ORDER — OXYCODONE HCL 5 MG PO TABS
ORAL_TABLET | ORAL | Status: AC
  Administered 2013-08-30: 10 mg via ORAL
  Filled 2013-08-30: qty 2

## 2013-08-30 MED ORDER — SODIUM CHLORIDE 0.9 % IR SOLN
Status: DC | PRN
  Administered 2013-08-30: 3000 mL

## 2013-08-30 MED ORDER — 0.9 % SODIUM CHLORIDE (POUR BTL) OPTIME
TOPICAL | Status: DC | PRN
  Administered 2013-08-30: 1000 mL

## 2013-08-30 MED ORDER — DIPHENHYDRAMINE HCL 50 MG/ML IJ SOLN
INTRAMUSCULAR | Status: AC
  Filled 2013-08-30: qty 1

## 2013-08-30 MED ORDER — HYDROMORPHONE HCL PF 1 MG/ML IJ SOLN
0.2500 mg | INTRAMUSCULAR | Status: DC | PRN
  Administered 2013-08-30 (×4): 0.5 mg via INTRAVENOUS

## 2013-08-30 MED ORDER — LIDOCAINE HCL (CARDIAC) 20 MG/ML IV SOLN
INTRAVENOUS | Status: DC | PRN
  Administered 2013-08-30: 60 mg via INTRAVENOUS

## 2013-08-30 MED ORDER — PROPOFOL 10 MG/ML IV BOLUS
INTRAVENOUS | Status: DC | PRN
  Administered 2013-08-30: 200 mg via INTRAVENOUS

## 2013-08-30 MED ORDER — LACTATED RINGERS IV SOLN
INTRAVENOUS | Status: DC | PRN
  Administered 2013-08-30 (×2): via INTRAVENOUS

## 2013-08-30 MED ORDER — FENTANYL CITRATE 0.05 MG/ML IJ SOLN
INTRAMUSCULAR | Status: DC | PRN
  Administered 2013-08-30: 25 ug via INTRAVENOUS
  Administered 2013-08-30: 50 ug via INTRAVENOUS
  Administered 2013-08-30: 25 ug via INTRAVENOUS
  Administered 2013-08-30 (×4): 50 ug via INTRAVENOUS
  Administered 2013-08-30: 100 ug via INTRAVENOUS

## 2013-08-30 MED ORDER — HYDROMORPHONE HCL PF 1 MG/ML IJ SOLN
INTRAMUSCULAR | Status: AC
  Administered 2013-08-30: 0.5 mg via INTRAVENOUS
  Filled 2013-08-30: qty 2

## 2013-08-30 SURGICAL SUPPLY — 61 items
BANDAGE CONFORM 3  STR LF (GAUZE/BANDAGES/DRESSINGS) IMPLANT
BANDAGE ELASTIC 3 VELCRO ST LF (GAUZE/BANDAGES/DRESSINGS) IMPLANT
BANDAGE ELASTIC 6 VELCRO ST LF (GAUZE/BANDAGES/DRESSINGS) ×2 IMPLANT
BANDAGE GAUZE ELAST BULKY 4 IN (GAUZE/BANDAGES/DRESSINGS) ×2 IMPLANT
BLADE SURG 10 STRL SS (BLADE) ×2 IMPLANT
BNDG COHESIVE 1X5 TAN STRL LF (GAUZE/BANDAGES/DRESSINGS) IMPLANT
BNDG COHESIVE 4X5 TAN STRL (GAUZE/BANDAGES/DRESSINGS) ×2 IMPLANT
BNDG COHESIVE 6X5 TAN STRL LF (GAUZE/BANDAGES/DRESSINGS) ×4 IMPLANT
BNDG GAUZE STRTCH 6 (GAUZE/BANDAGES/DRESSINGS) IMPLANT
CLOTH BEACON ORANGE TIMEOUT ST (SAFETY) ×2 IMPLANT
CORDS BIPOLAR (ELECTRODE) IMPLANT
COVER SURGICAL LIGHT HANDLE (MISCELLANEOUS) ×2 IMPLANT
CUFF TOURNIQUET SINGLE 18IN (TOURNIQUET CUFF) ×2 IMPLANT
CUFF TOURNIQUET SINGLE 24IN (TOURNIQUET CUFF) IMPLANT
CUFF TOURNIQUET SINGLE 34IN LL (TOURNIQUET CUFF) IMPLANT
CUFF TOURNIQUET SINGLE 44IN (TOURNIQUET CUFF) IMPLANT
DRAPE ORTHO SPLIT 77X108 STRL (DRAPES) ×4
DRAPE SURG 17X23 STRL (DRAPES) IMPLANT
DRAPE SURG ORHT 6 SPLT 77X108 (DRAPES) ×2 IMPLANT
DRAPE U-SHAPE 47X51 STRL (DRAPES) ×2 IMPLANT
DRSG ADAPTIC 3X8 NADH LF (GAUZE/BANDAGES/DRESSINGS) ×4 IMPLANT
DRSG PAD ABDOMINAL 8X10 ST (GAUZE/BANDAGES/DRESSINGS) ×4 IMPLANT
DURAPREP 26ML APPLICATOR (WOUND CARE) ×2 IMPLANT
ELECT CAUTERY BLADE 6.4 (BLADE) IMPLANT
ELECT REM PT RETURN 9FT ADLT (ELECTROSURGICAL)
ELECTRODE REM PT RTRN 9FT ADLT (ELECTROSURGICAL) IMPLANT
GAUZE XEROFORM 1X8 LF (GAUZE/BANDAGES/DRESSINGS) ×2 IMPLANT
GLOVE BIO SURGEON STRL SZ8 (GLOVE) ×4 IMPLANT
GLOVE BIOGEL PI IND STRL 8 (GLOVE) ×2 IMPLANT
GLOVE BIOGEL PI INDICATOR 8 (GLOVE) ×2
GLOVE ORTHO TXT STRL SZ7.5 (GLOVE) ×2 IMPLANT
GOWN PREVENTION PLUS LG XLONG (DISPOSABLE) IMPLANT
GOWN PREVENTION PLUS XLARGE (GOWN DISPOSABLE) ×4 IMPLANT
GOWN STRL NON-REIN LRG LVL3 (GOWN DISPOSABLE) ×2 IMPLANT
HANDPIECE INTERPULSE COAX TIP (DISPOSABLE) ×2
KIT BASIN OR (CUSTOM PROCEDURE TRAY) ×2 IMPLANT
KIT ROOM TURNOVER OR (KITS) ×2 IMPLANT
MANIFOLD NEPTUNE II (INSTRUMENTS) ×2 IMPLANT
NS IRRIG 1000ML POUR BTL (IV SOLUTION) ×2 IMPLANT
PACK ORTHO EXTREMITY (CUSTOM PROCEDURE TRAY) ×2 IMPLANT
PAD ARMBOARD 7.5X6 YLW CONV (MISCELLANEOUS) ×4 IMPLANT
PADDING CAST ABS 4INX4YD NS (CAST SUPPLIES)
PADDING CAST ABS COTTON 4X4 ST (CAST SUPPLIES) IMPLANT
PADDING CAST COTTON 6X4 STRL (CAST SUPPLIES) IMPLANT
SET HNDPC FAN SPRY TIP SCT (DISPOSABLE) ×1 IMPLANT
SPONGE GAUZE 4X4 12PLY (GAUZE/BANDAGES/DRESSINGS) ×2 IMPLANT
SPONGE LAP 18X18 X RAY DECT (DISPOSABLE) ×4 IMPLANT
STOCKINETTE IMPERVIOUS 9X36 MD (GAUZE/BANDAGES/DRESSINGS) ×2 IMPLANT
SUT ETHILON 2 0 FS 18 (SUTURE) ×10 IMPLANT
SUT ETHILON 3 0 PS 1 (SUTURE) IMPLANT
SUT MNCRL AB 4-0 PS2 18 (SUTURE) ×16 IMPLANT
SYR CONTROL 10ML LL (SYRINGE) IMPLANT
TISSUE THERASKIN 2X3 (Tissue) ×10 IMPLANT
TOWEL OR 17X24 6PK STRL BLUE (TOWEL DISPOSABLE) ×2 IMPLANT
TOWEL OR 17X26 10 PK STRL BLUE (TOWEL DISPOSABLE) ×2 IMPLANT
TUBE ANAEROBIC SPECIMEN COL (MISCELLANEOUS) IMPLANT
TUBE CONNECTING 12X1/4 (SUCTIONS) ×2 IMPLANT
TUBE FEEDING 5FR 15 INCH (TUBING) IMPLANT
UNDERPAD 30X30 INCONTINENT (UNDERPADS AND DIAPERS) ×2 IMPLANT
WATER STERILE IRR 1000ML POUR (IV SOLUTION) IMPLANT
YANKAUER SUCT BULB TIP NO VENT (SUCTIONS) ×2 IMPLANT

## 2013-08-30 NOTE — Progress Notes (Signed)
Pt received from PACU. 

## 2013-08-30 NOTE — Evaluation (Signed)
Occupational Therapy Evaluation Patient Details Name: Lindsay [name] MRN: [id number] DOB: [birth year] Today's Date: 08/30/2013 Time: 9629-5284 OT Time Calculation (min): 38 min  OT Assessment / Plan / Recommendation History of present illness Pt adm with infection to rt lower leg and has undergone multiple I&D's.  Pt for possible skin graft 08/30/13.   Clinical Impression   Pt demos decline in function with ADLs and ADL mobility safety and would benefit from acute OT services to address impairments to help restore PLOF. Pain limiting pt's ADL ability    OT Assessment  Patient needs continued OT Services    Follow Up Recommendations  Home health OT;Supervision - Intermittent    Barriers to Discharge   none  Equipment Recommendations  Other (comment) Lexicographer)    Recommendations for Other Services    Frequency  Min 2X/week    Precautions / Restrictions Precautions Precautions: None Restrictions Weight Bearing Restrictions: No   Pertinent Vitals/Pain 3/10 R LE pain    ADL  Grooming: Performed;Wash/dry hands;Wash/dry face;Min guard Where Assessed - Grooming: Supported standing;Unsupported standing Upper Body Bathing: Performed;Chest;Right arm;Left arm;Min guard Where Assessed - Upper Body Bathing: Supported standing;Unsupported standing Lower Body Bathing: Performed;Moderate assistance Upper Body Dressing: Performed;Min guard Where Assessed - Upper Body Dressing: Supported standing;Unsupported standing Lower Body Dressing: Performed;Moderate assistance Toilet Transfer: Performed;Minimal assistance Toilet Transfer Method: Sit to stand Toilet Transfer Equipment: Regular height toilet;Grab bars Toileting - Clothing Manipulation and Hygiene: Performed;Minimal assistance Where Assessed - Glass blower/designer Manipulation and Hygiene: Standing Tub/Shower Transfer: Insurance risk surveyor Method: Science writer: Grab bars;Walk in shower Equipment  Used: Rolling walker;Gait belt Transfers/Ambulation Related to ADLs: Verbal cues for hand placement. ADL Comments: pain limiting function for LB ADLs    OT Diagnosis: Acute pain  OT Problem List: Decreased knowledge of use of DME or AE;Pain;Impaired balance (sitting and/or standing) OT Treatment Interventions: Self-care/ADL training;Patient/family education;Balance training;Therapeutic activities;DME and/or AE instruction   OT Goals(Current goals can be found in the care plan section) Acute Rehab OT Goals Patient Stated Goal: return home OT Goal Formulation: With patient Time For Goal Achievement: 09/06/13 Potential to Achieve Goals: Good ADL Goals Pt Will Perform Grooming: with set-up;with supervision;standing Pt Will Perform Upper Body Bathing: with set-up;with supervision;sitting;standing Pt Will Perform Lower Body Bathing: with min guard assist;with adaptive equipment;sitting/lateral leans;sit to/from stand Pt Will Perform Upper Body Dressing: with supervision;with set-up;standing;sitting Pt Will Perform Lower Body Dressing: with min assist;with adaptive equipment;sitting/lateral leans;sit to/from stand Pt Will Transfer to Toilet: with min guard assist;with supervision;grab bars;ambulating;regular height toilet Pt Will Perform Toileting - Clothing Manipulation and hygiene: with min guard assist;with supervision;sitting/lateral leans;sit to/from stand  Visit Information  Last OT Received On: 08/30/13 Assistance Needed: +1 History of Present Illness: Pt adm with infection to rt lower leg and has undergone multiple I&D's.  Pt for possible skin graft 08/30/13.       Prior Functioning     Home Living Family/patient expects to be discharged to:: Private residence Living Arrangements: Alone Available Help at Discharge: Family;Available PRN/intermittently Type of Home: House Home Access: Stairs to enter [employer] of Steps: 4 Entrance Stairs-Rails: Right Home Layout:  One level Home Equipment: None Communication Communication: No difficulties Dominant Hand: Right         Vision/Perception Vision - History Baseline Vision: Wears glasses only for reading Patient Visual Report: No change from baseline Perception Perception: Within Functional Limits   Cognition  Cognition Arousal/Alertness: Awake/alert Behavior During Therapy: WFL for tasks assessed/performed Overall Cognitive Status: Within Functional  Limits for tasks assessed    Extremity/Trunk Assessment Upper Extremity Assessment Upper Extremity Assessment: Overall WFL for tasks assessed Lower Extremity Assessment Lower Extremity Assessment: Defer to PT evaluation Cervical / Trunk Assessment Cervical / Trunk Assessment: Normal     Mobility Bed Mobility Bed Mobility: Not assessed Details for Bed Mobility Assistance: pt in recliner Transfers Sit to Stand: 4: Min assist;With upper extremity assist;From bed;From chair/3-in-1 Stand to Sit: 4: Min assist;With upper extremity assist;To toilet;With armrests;To chair/3-in-1 Details for Transfer Assistance: Verbal cues for hand placement.     Exercise     Balance Balance Balance Assessed: Yes Dynamic Sitting Balance Dynamic Sitting - Balance Support: No upper extremity supported;Feet unsupported;During functional activity Dynamic Sitting - Level of Assistance: 6: Modified independent (Device/Increase time) Static Standing Balance Static Standing - Balance Support: Bilateral upper extremity supported;During functional activity Static Standing - Level of Assistance: 5: Stand by assistance Dynamic Standing Balance Dynamic Standing - Balance Support: Right upper extremity supported;Left upper extremity supported;No upper extremity supported;During functional activity Dynamic Standing - Level of Assistance: Other (comment) (min guard A)   End of Session OT - End of Session Equipment Utilized During Treatment: Rolling walker;Gait  belt Activity Tolerance: Patient tolerated treatment well Patient left: in chair;with call bell/phone within reach  GO     Galen Manila 08/30/2013, 11:57 AM

## 2013-08-30 NOTE — Brief Op Note (Signed)
08/21/2013 - 08/30/2013  6:42 PM  PATIENT:  Lindsay [name]  62 y.o. female  PRE-OPERATIVE DIAGNOSIS:  Right leg wound  POST-OPERATIVE DIAGNOSIS:  Right Leg Wound  PROCEDURE:  Procedure(s): Repeat IRRIGATION AND DEBRIDEMENT Right leg, skin graft (Right)  SURGEON:  Surgeon(s) and Role:    * Kathryne Hitch, MD - Primary  PHYSICIAN ASSISTANT: Rexene Edison, PA-C  ANESTHESIA:   general  EBL:  Total I/O In: 1000 [I.V.:1000] Out: -   BLOOD ADMINISTERED:none  DRAINS: none   LOCAL MEDICATIONS USED:  NONE  SPECIMEN:  No Specimen  DISPOSITION OF SPECIMEN:  N/A  COUNTS:  YES  TOURNIQUET:  * No tourniquets in log *  DICTATION: .Other Dictation: Dictation Number [id number]  PLAN OF CARE: Admit to inpatient   PATIENT DISPOSITION:  PACU - hemodynamically stable.   Delay start of Pharmacological VTE agent (>24hrs) due to surgical blood loss or risk of bleeding: no

## 2013-08-30 NOTE — Progress Notes (Signed)
INFECTIOUS DISEASE PROGRESS NOTE  ID: Lindsay [name] is a 62 y.o. female with  Principal Problem:   Cellulitis Active Problems:   OBSTRUCTIVE SLEEP APNEA   HTN (hypertension)  Subjective: Without complaints  Abtx:  Anti-infectives   Start     Dose/Rate Route Frequency Ordered Stop   08/27/13 1500  DAPTOmycin (CUBICIN) 760 mg in sodium chloride 0.9 % IVPB     8 mg/kg  95 kg 230.4 mL/hr over 30 Minutes Intravenous Every 24 hours 08/27/13 1317     08/27/13 1015  rifampin (RIFADIN) capsule 300 mg  Status:  Discontinued     300 mg Oral Every 12 hours 08/27/13 1006 08/27/13 1317   08/24/13 1400  ceFAZolin (ANCEF) IVPB 2 g/50 mL premix  Status:  Discontinued     2 g 100 mL/hr over 30 Minutes Intravenous 3 times per day 08/24/13 1101 08/27/13 1317   08/23/13 0945  DAPTOmycin (CUBICIN) 760 mg in sodium chloride 0.9 % IVPB  Status:  Discontinued     8 mg/kg  95 kg 230.4 mL/hr over 30 Minutes Intravenous Every 24 hours 08/23/13 0936 08/23/13 0937   08/23/13 0945  DAPTOmycin (CUBICIN) 570 mg in sodium chloride 0.9 % IVPB  Status:  Discontinued     6 mg/kg  95 kg 222.8 mL/hr over 30 Minutes Intravenous Every 24 hours 08/23/13 0937 08/24/13 1055   08/23/13 0930  clindamycin (CLEOCIN) IVPB 600 mg  Status:  Discontinued     600 mg 100 mL/hr over 30 Minutes Intravenous 3 times per day 08/23/13 0922 08/24/13 1055   08/22/13 0600  vancomycin (VANCOCIN) IVPB 1000 mg/200 mL premix  Status:  Discontinued     1,000 mg 200 mL/hr over 60 Minutes Intravenous Every 12 hours 08/21/13 2128 08/23/13 0923   08/21/13 2230  piperacillin-tazobactam (ZOSYN) IVPB 3.375 g  Status:  Discontinued     3.375 g 12.5 mL/hr over 240 Minutes Intravenous 3 times per day 08/21/13 2129 08/23/13 0923   08/21/13 1715  vancomycin (VANCOCIN) 1,750 mg in sodium chloride 0.9 % 500 mL IVPB     1,750 mg 250 mL/hr over 120 Minutes Intravenous STAT 08/21/13 1711 08/21/13 1924   08/21/13 1645  vancomycin (VANCOCIN) 20 mg/kg in  sodium chloride 0.9 % 100 mL IVPB  Status:  Discontinued     20 mg/kg 100 mL/hr over 60 Minutes Intravenous  Once 08/21/13 1630 08/21/13 1712      Medications:  Scheduled: . amLODipine  10 mg Oral Daily  . DAPTOmycin (CUBICIN)  IV  8 mg/kg Intravenous Q24H  . heparin subcutaneous  5,000 Units Subcutaneous Q8H    Objective: Vital signs in last 24 hours: Temp:  [97.5 F (36.4 C)-99.7 F (37.6 C)] 99.4 F (37.4 C) (12/16 0602) Pulse Rate:  [93-97] 97 (12/16 0602) Resp:  [18] 18 (12/16 0602) BP: (128-144)/(74-87) 128/75 mmHg (12/16 0602) SpO2:  [97 %-99 %] 97 % (12/16 0602)   General appearance: alert, cooperative and no distress Resp: clear to auscultation bilaterally Cardio: regular rate and rhythm GI: normal findings: bowel sounds normal and soft, non-tender Skin: erythema - arm(s) bilateral Incision/Wound: dressed RLE  Lab Results  Recent Labs  08/29/13 0545 08/30/13 0451  WBC 14.9* 16.5*  HGB 11.1* 10.7*  HCT 34.0* 33.0*  NA 140 137  K 3.6 3.8  CL 104 104  CO2 24 22  BUN 11 10  CREATININE 0.89 0.87   Liver Panel No results found for this basename: PROT, ALBUMIN, AST, ALT, ALKPHOS, BILITOT, BILIDIR,  IBILI,  in the last 72 hours Sedimentation Rate No results found for this basename: ESRSEDRATE,  in the last 72 hours C-Reactive Protein No results found for this basename: CRP,  in the last 72 hours  Microbiology: Recent Results (from the past 240 hour(s))  WOUND CULTURE     Status: None   Collection Time    08/21/13  8:43 PM      Result Value Range Status   Specimen Description WOUND   Final   Special Requests RIGHT LATERAL CALF PATIENT ON FOLLOWING VANCOMYCIN   Final   Gram Stain     Final   Value: NO WBC SEEN     NO SQUAMOUS EPITHELIAL CELLS SEEN     FEW GRAM POSITIVE COCCI IN PAIRS     Performed at [facility]   Culture     Final   Value: MODERATE STAPHYLOCOCCUS AUREUS     Note: RIFAMPIN AND GENTAMICIN SHOULD NOT BE USED AS SINGLE DRUGS  FOR TREATMENT OF STAPH INFECTIONS. This organism DOES NOT demonstrate inducible Clindamycin resistance in vitro.     Performed at [facility]   Report Status 08/24/2013 FINAL   Final   Organism ID, Bacteria STAPHYLOCOCCUS AUREUS   Final  SURGICAL PCR SCREEN     Status: None   Collection Time    08/23/13  4:15 PM      Result Value Range Status   MRSA, PCR NEGATIVE  NEGATIVE Final   Staphylococcus aureus NEGATIVE  NEGATIVE Final   Comment:            The Xpert SA Assay (FDA     approved for NASAL specimens     in patients over 63 years of age),     is one component of     a comprehensive surveillance     program.  Test performance has     been validated by [employer] for patients greater     than or equal to 28 year old.     It is not intended     to diagnose infection nor to     guide or monitor treatment.    Studies/Results: No results found.   Assessment/Plan: Cellulitis, abscess RLE   MSSA   I & D 12-9, 12-14 ADR  Total days of antibiotics: 7 (dapto)  To OR today for skin grafting.  Her rash is improving.  No change in anbx for now, suggest she will need for 2-3 weeks         [name] Infectious Diseases (pager) [phone] [email or web address] 08/30/2013, 11:26 AM  LOS: 9 days

## 2013-08-30 NOTE — Progress Notes (Signed)
Physical Therapy Treatment Patient Details Name: Lindsay [name] MRN: [id number] DOB: 06-11-1951 Today's Date: 08/30/2013 Time: 1478-2956 PT Time Calculation (min): 25 min  PT Assessment / Plan / Recommendation  History of Present Illness Pt adm with infection to rt lower leg and has undergone multiple I&D's.  Pt for skin graft 08/30/13.   PT Comments   Pt admitted with above. Pt currently with functional limitations due to the deficits listed below (see PT Problem List). Patient is currently able to bear weight and have limited ambulation on R LE.  Patient reports that she will have 24 hr care at home. Pt will benefit from skilled PT to increase their independence and safety with mobility to allow discharge to the venue listed below.  This will need to continue to be assessed after her skin graft scheduled for today.   Follow Up Recommendations  Home health PT;Supervision - Intermittent           Equipment Recommendations  Rolling walker with 5" wheels;Wheelchair (measurements PT)       Frequency Min 3X/week   Progress towards PT Goals Progress towards PT goals: Progressing toward goals  Plan Current plan remains appropriate    Precautions / Restrictions Precautions Precautions: Fall Restrictions Weight Bearing Restrictions: No   Pertinent Vitals/Pain 5/10 in R LE, pain medication kicking in from 1 hr ago, changed position and supported after walking to relieve pain. On room air.    Mobility  Bed Mobility Bed Mobility: Not assessed Details for Bed Mobility Assistance: pt in recliner Transfers Transfers: Sit to Stand;Stand to Sit Sit to Stand: 4: Min assist;Without upper extremity assist;From chair/3-in-1 Stand to Sit: 4: Min assist;With upper extremity assist;To chair/3-in-1 Details for Transfer Assistance: VC for proper hand placement on chair to push up from.  Discourage using both hands on walker as she pushes into standing Ambulation/Gait Ambulation/Gait Assistance: 4:  Min assist Ambulation Distance (Feet): 50 Feet (Required a 1 min standing rest break due to fatigue after 30'. ) Assistive device: Rolling walker Ambulation/Gait Assistance Details: Verbal and tactile cues for proper ambulation with RW.  Cues to address keeping her body within the handles of the RW for safety, this got better with repeated cuing.  Intermittent cues for upright posture did not elicit a change, as we walked I decreased my cuing and allowed sub-par posture because patient had poor posture due to leg pain. Gait Pattern: Step-to pattern;Decreased stride length (R leg doesn't clear the ground by as much as the L leg does) Gait velocity: decreased due to tentative WB on R LE General Gait Details: A for safety and support Stairs: No      PT Goals (current goals can now be found in the care plan section) Acute Rehab PT Goals Patient Stated Goal: return home PT Goal Formulation: With patient Time For Goal Achievement: 09/05/13 Potential to Achieve Goals: Good  Visit Information  Last PT Received On: 08/30/13 Assistance Needed: +1 Reason Eval/Treat Not Completed: Other (comment) History of Present Illness: Pt adm with infection to rt lower leg and has undergone multiple I&D's.  Pt for skin graft 08/30/13.    Subjective Data  Patient Stated Goal: return home   Cognition  Cognition Arousal/Alertness: Awake/alert Behavior During Therapy: WFL for tasks assessed/performed Overall Cognitive Status: Within Functional Limits for tasks assessed    Balance  Balance Balance Assessed: Yes Dynamic Sitting Balance Dynamic Sitting - Balance Support: No upper extremity supported;Feet unsupported;During functional activity Dynamic Sitting - Level of Assistance: 6: Modified independent (  Device/Increase time) Cytogeneticist Standing - Balance Support: Bilateral upper extremity supported;During functional activity Static Standing - Level of Assistance: 5: Stand by  assistance Dynamic Standing Balance Dynamic Standing - Balance Support: Right upper extremity supported;Left upper extremity supported;No upper extremity supported;During functional activity Dynamic Standing - Level of Assistance: Other (comment) (min guard A)  End of Session PT - End of Session Equipment Utilized During Treatment: Gait belt (R leg wrap around distal tibia) Activity Tolerance: Patient limited by pain Patient left: in chair;with call bell/phone within reach Nurse Communication: Mobility status   GP    Barrie Dunker, SPT Pager:  [phone]  Barrie Dunker 08/30/2013, 1:27 PM

## 2013-08-30 NOTE — Progress Notes (Signed)
Hydrotherapy Cancellation Note  Patient Details Name: Lindsay [name] MRN: [id number] DOB: Oct 28, 1950   Cancelled Treatment:    Reason Eval/Treat Not Completed: Other (comment); spoke with RN who reports pt going for skin graft and no new orders for hydrotherapy at this time.   Elleanna Melling,CYNDI 08/30/2013, 10:13 AM

## 2013-08-30 NOTE — Anesthesia Procedure Notes (Signed)
Procedure Name: LMA Insertion Date/Time: 08/30/2013 5:14 PM Performed by: Ellin Goodie Pre-anesthesia Checklist: Patient identified, Emergency Drugs available, Suction available, Patient being monitored and Timeout performed Patient Re-evaluated:Patient Re-evaluated prior to inductionOxygen Delivery Method: Circle system utilized Preoxygenation: Pre-oxygenation with 100% oxygen Intubation Type: IV induction Ventilation: Mask ventilation without difficulty LMA: LMA inserted LMA Size: 4.0 Number of attempts: 1 Tube secured with: Tape Dental Injury: Teeth and Oropharynx as per pre-operative assessment  Comments: Easy induction and LMA insertion with good seal.  Dr. Katrinka Blazing verified placement.  Carlynn Herald, CRNA

## 2013-08-30 NOTE — Progress Notes (Signed)
Received report from PACU RN 

## 2013-08-30 NOTE — Preoperative (Signed)
Beta Blockers   Reason not to administer Beta Blockers:Not Applicable 

## 2013-08-30 NOTE — Progress Notes (Signed)
[facility] PROGRESS NOTE  Lindsay [name] ZOX:[id number] DOB: November 25, 1950 DOA: 08/21/2013 PCP: No PCP Per Patient  HPI/Subjective:  Still has low-grade fever of 99.7, WBCs jumped from 14.9 to 16.5 today. Him not sure that is related to her right lower extremity wound as it appears much better.  Interim Summary Patient is a pleasant 62 year old female with a past medical history of hypertension who was admitted to the medicine service on 08/21/2013, presenting with complaints of increased right leg swelling, pain that had been present for over a week. In the emergency department she underwent incision and drainage. She was started on empiric IV antibiotic therapy with vancomycin and Zosyn. She had an MRI of her right lower extremity performed on 08/22/2013 which did not show evidence of abscess, osteomyelitis, myositis or deep fasciitis. On 08/23/2013 patient's leg appeared worse with significant pain, erythema and purulence. I discussed case with Dr. Ninetta Lights of infectious disease who made the recommendation to discontinue vancomycin and Zosyn, starting Cubicin and clindamycin. Patient on the evening of 08/22/2013 developing a rash to vancomycin. Case was also discussed case with Dr. Simeon Craft of orthopedic surgery who evaluated patient on 08/23/2013 and recommended aggressive irrigation and debridement in the OR today.  Wound Cultures positive for MSSA.                                                                               Assessment/Plan:  Cellulitis/abscess of right lower extremity Patient presenting with clinical signs and symptoms consistent with cellulitis/abscess. Undergone incision and drainage in the emergency department. She was started on empiric IV antibiotic therapy with Zosyn and vancomycin.  Overnight she developed rash to vancomycin.  Was on Cubicin and Clindamycin, culture growing MSSA, will switch to Ancef. S/p debridement and placement of wound VAC, VAC removed.   Status post debridement x2, she did ago for skin grafting a.m. Ulcer seen while patient getting hydrotherapy, no evidence of pus, base is healthy granulation tissues.  Hypertension  Blood pressure is better controlled, continue amlodipine 10 mg by mouth daily.  Hypokalemia Repleted orally  Pain management She is on morphine 1 mg IV every 4 hours as needed and Norco 5/325 one tablet every 6 hours as needed  Code Status: Full code Disposition Plan: Likely she'll be discharged home in a.m. if it's okay with orthopedics.  Consults Orthopedic surgery ID  Procedures:  Incision and drainage of abscess performed in the emergency department on 08/21/2013  Antibiotics:  Zosyn (started on 08/21/2013 discontinued on 08/23/2013)  Vancomycin (started on 08/21/2013 discontinued on 08/23/2013)  Cubicin (started on 08/23/2013) D/C on 12/10  Clindamycin (started on 08/23/2013) D/C on 12/10  Ancef started on 12/10 discontinued  Cubicin restarted on 12/13  Objective: Filed Vitals:   08/30/13 0602  BP: 128/75  Pulse: 97  Temp: 99.4 F (37.4 C)  Resp: 18    Intake/Output Summary (Last 24 hours) at 08/30/13 1009 Last data filed at 08/29/13 1843  Gross per 24 hour  Intake    480 ml  Output      0 ml  Net    480 ml   Filed Weights   08/21/13 1702 08/21/13 2058  Weight: 94.348 kg (208 lb) 95.029 kg (  209 lb 8 oz)    Exam:   General:  No acute distress she is awake alert and oriented x3  Cardiovascular: Regular rate rhythm normal S1 is  Respiratory: Lungs are clear to auscultation bilaterally no wheezing rhonchi or rales  Abdomen: Soft nontender nondistended  Musculoskeletal: Patient's leg appears worse and today, with purulent drainage over multiple areas in a 5 cm diameter region. There is increased erythema and significant pain to palpation over this region.   Data Reviewed: Basic Metabolic Panel:  Recent Labs Lab 08/25/13 0650 08/26/13 0750 08/29/13 0545  08/30/13 0451  NA 137 139 140 137  K 3.6 3.9 3.6 3.8  CL 103 102 104 104  CO2 23 25 24 22   GLUCOSE 109* 98 102* 96  BUN 15 15 11 10   CREATININE 1.03 1.05 0.89 0.87  CALCIUM 8.8 8.9 8.5 8.6   Liver Function Tests: No results found for this basename: AST, ALT, ALKPHOS, BILITOT, PROT, ALBUMIN,  in the last 168 hours No results found for this basename: LIPASE, AMYLASE,  in the last 168 hours No results found for this basename: AMMONIA,  in the last 168 hours CBC:  Recent Labs Lab 08/26/13 0750 08/27/13 0455 08/28/13 0925 08/29/13 0545 08/30/13 0451  WBC 16.4* 15.9* 17.0* 14.9* 16.5*  NEUTROABS  --  13.4*  --   --   --   HGB 12.0 11.8* 11.3* 11.1* 10.7*  HCT 36.5 35.9* 33.7* 34.0* 33.0*  MCV 85.1 86.1 84.7 86.1 86.6  PLT 427* 421* 445* 440* 429*   Cardiac Enzymes:  Recent Labs Lab 08/28/13 0416  CKTOTAL 65   BNP (last 3 results) No results found for this basename: PROBNP,  in the last 8760 hours CBG:  Recent Labs Lab 08/27/13 0626  GLUCAP 101*    Recent Results (from the past 240 hour(s))  WOUND CULTURE     Status: None   Collection Time    08/21/13  8:43 PM      Result Value Range Status   Specimen Description WOUND   Final   Special Requests RIGHT LATERAL CALF PATIENT ON FOLLOWING VANCOMYCIN   Final   Gram Stain     Final   Value: NO WBC SEEN     NO SQUAMOUS EPITHELIAL CELLS SEEN     FEW GRAM POSITIVE COCCI IN PAIRS     Performed at [facility]   Culture     Final   Value: MODERATE STAPHYLOCOCCUS AUREUS     Note: RIFAMPIN AND GENTAMICIN SHOULD NOT BE USED AS SINGLE DRUGS FOR TREATMENT OF STAPH INFECTIONS. This organism DOES NOT demonstrate inducible Clindamycin resistance in vitro.     Performed at [facility]   Report Status 08/24/2013 FINAL   Final   Organism ID, Bacteria STAPHYLOCOCCUS AUREUS   Final  SURGICAL PCR SCREEN     Status: None   Collection Time    08/23/13  4:15 PM      Result Value Range Status   MRSA, PCR NEGATIVE   NEGATIVE Final   Staphylococcus aureus NEGATIVE  NEGATIVE Final   Comment:            The Xpert SA Assay (FDA     approved for NASAL specimens     in patients over 16 years of age),     is one component of     a comprehensive surveillance     program.  Test performance has     been validated by [employer] [facility] for patients greater     than or equal to 74 year old.     It is not intended     to diagnose infection nor to     guide or monitor treatment.     Studies: No results found.  Scheduled Meds: . amLODipine  10 mg Oral Daily  . DAPTOmycin (CUBICIN)  IV  8 mg/kg Intravenous Q24H  . heparin subcutaneous  5,000 Units Subcutaneous Q8H   Continuous Infusions: . sodium chloride 20 mL/hr at 08/27/13 1212    Principal Problem:   Cellulitis Active Problems:   OBSTRUCTIVE SLEEP APNEA   HTN (hypertension)    Time spent: 35 min    [facility]  [facility] Pager [phone]. If 7PM-7AM, please contact night-coverage at [email or web address], password [email or web address] 08/30/2013, 10:09 AM  LOS: 9 days

## 2013-08-30 NOTE — Progress Notes (Signed)
Read, reviewed, and agree.  Leva Baine B. Orlondo Holycross, PT, DPT #[phone]  

## 2013-08-30 NOTE — Consult Note (Signed)
  As per my notes the last 2 days, we are proceeding to the OR today for a repeat I&D of her right leg wound and skin grafting.  She understands this and agrees.

## 2013-08-30 NOTE — Transfer of Care (Signed)
Immediate Anesthesia Transfer of Care Note  Patient: Lindsay [name]  Procedure(s) Performed: Procedure(s): Repeat IRRIGATION AND DEBRIDEMENT Right leg, skin graft (Right)  Patient Location: PACU  Anesthesia Type:General  Level of Consciousness: awake, alert  and oriented  Airway & Oxygen Therapy: Patient Spontanous Breathing  Post-op Assessment: Report given to PACU RN  Post vital signs: stable  Complications: No apparent anesthesia complications

## 2013-08-30 NOTE — Anesthesia Postprocedure Evaluation (Signed)
Anesthesia Post Note  Patient: Lindsay [name]  Procedure(s) Performed: Procedure(s) (LRB): Repeat IRRIGATION AND DEBRIDEMENT Right leg, skin graft (Right)  Anesthesia type: General  Patient location: PACU  Post pain: Pain level controlled  Post assessment: Patient's Cardiovascular Status Stable  Last Vitals:  Filed Vitals:   08/30/13 1930  BP: 173/75  Pulse: 98  Temp:   Resp: 18    Post vital signs: Reviewed and stable  Level of consciousness: alert  Complications: No apparent anesthesia complications

## 2013-08-30 NOTE — Anesthesia Preprocedure Evaluation (Addendum)
Anesthesia Evaluation  Patient identified by MRN, date of birth, ID band Patient awake    Reviewed: Allergy & Precautions, H&P , NPO status , Patient's Chart, lab work & pertinent test results, reviewed documented beta blocker date and time   Airway Mallampati: II TM Distance: >3 FB     Dental  (+) Partial Lower, Dental Advisory Given and Teeth Intact   Pulmonary sleep apnea and Continuous Positive Airway Pressure Ventilation ,  breath sounds clear to auscultation        Cardiovascular hypertension, Pt. on medications Rhythm:Regular     Neuro/Psych negative neurological ROS  negative psych ROS   GI/Hepatic negative GI ROS, Neg liver ROS,   Endo/Other  negative endocrine ROS  Renal/GU negative Renal ROS     Musculoskeletal negative musculoskeletal ROS (+)   Abdominal (+)  Abdomen: soft. Bowel sounds: normal.  Peds  Hematology negative hematology ROS (+)   Anesthesia Other Findings   Reproductive/Obstetrics negative OB ROS                        Anesthesia Physical Anesthesia Plan  ASA: III  Anesthesia Plan: General   Post-op Pain Management:    Induction: Intravenous  Airway Management Planned: LMA and Oral ETT  Additional Equipment:   Intra-op Plan:   Post-operative Plan: Extubation in OR  Informed Consent:   Plan Discussed with:   Anesthesia Plan Comments:         Anesthesia Quick Evaluation

## 2013-08-30 NOTE — Progress Notes (Signed)
Patient was not ready to use the CPAP machine at this time. Patient is aware to call RT when she is ready to go on the machine. RT will continue to assist as needed.

## 2013-08-31 LAB — CBC
HCT: 29.5 % — ABNORMAL LOW (ref 36.0–46.0)
Hemoglobin: 9.5 g/dL — ABNORMAL LOW (ref 12.0–15.0)
MCH: 27.6 pg (ref 26.0–34.0)
MCHC: 32.2 g/dL (ref 30.0–36.0)
MCV: 85.8 fL (ref 78.0–100.0)
Platelets: 417 10*3/uL — ABNORMAL HIGH (ref 150–400)
RBC: 3.44 MIL/uL — ABNORMAL LOW (ref 3.87–5.11)
RDW: 14.5 % (ref 11.5–15.5)
WBC: 14 10*3/uL — ABNORMAL HIGH (ref 4.0–10.5)

## 2013-08-31 MED ORDER — SODIUM CHLORIDE 0.9 % IJ SOLN
10.0000 mL | INTRAMUSCULAR | Status: DC | PRN
  Administered 2013-08-31 – 2013-09-01 (×3): 10 mL

## 2013-08-31 NOTE — Op Note (Signed)
Lindsay [name], Lindsay [name] NO.:  [id number]  MEDICAL RECORD NO.:  [id number]  LOCATION:  [id number]                        FACILITY:  [facility]  PHYSICIAN:  Vanita Panda. Magnus Ivan, M.D.DATE OF BIRTH:  [birth year]  DATE OF PROCEDURE:  08/30/2013 DATE OF DISCHARGE:                              OPERATIVE REPORT   PREOPERATIVE DIAGNOSIS:  Right leg wound measuring 10 cm x 12 cm.  POSTOPERATIVE DIAGNOSIS:  Right leg wound measuring 10 cm x 12 cm.  PROCEDURE: 1. Irrigation and debridement of right leg wound including sharp     debridement of skin and fascia only. 2. Thorough irrigation of the wound. 3. Placement of split thickness, human allograft skin graft     (TheraSkin) between 100-200 square cm.  SURGEON:  Doneen Poisson, MD.  ASSISTANT:  Richardean Canal, P.A.C.  ANESTHESIA:  General.  BLOOD LOSS:  Less than 100 mL.  COMPLICATIONS:  None.  INDICATIONS:  Ms. Keahey is a 62 year old female, who came to the hospital with a large boil on her right leg.  This was I and D'ed in the ER and then 2 days later, after failure of antibiotic therapy, Orthopedic surgery was consulted.  I found a very large macerated wound with significant purulence underlying soft tissue.  I took her to the operating room on Tuesday of last week for irrigation, debridement, and placement of a VAC.  We then removed the VAC at the bedside and had hydrotherapy treated for her.  We took her back to the operating room this past Saturday for 2nd irrigation and debridement, still some gross purulence.  She was then treated with hydrotherapy again and now presents today for further irrigation and debridement and hopeful skin grafting.  She understands the risks and benefits of this and does wish to proceed.  PROCEDURE DESCRIPTION:  After informed consent was obtained, appropriate right leg was marked.  She was brought to the operating room, placed supine on the operating table.  General anesthesia  was then obtained. Her right leg was prepped and draped with Betadine scrub and paint. Time-out was called and she was identified as correct patient and correct right leg.  We found still gross purulence around some of the margins.  We were able to sharply debride this with a knife down to the fascial layer and we finally got stable margins throughout.  The remaining of the wound was nice granulation tissue.  We placed 3 L normal saline solution using pulsatile lavage throughout the skin area. We then opened up 5 pieces of thorough skin human allograft split- thickness skin graft, it was already meshed and meticulously sewed this in with interrupted 4-0 Monocryl suture all around the edges.  We then placed Adaptic and well- padded sterile dressing around this.  She was awakened, extubated, and taken to recovery room in stable condition.  All final counts were correct.  There were no complications noted.     Vanita Panda. Magnus Ivan, M.D.     CYB/MEDQ  D:  08/30/2013  T:  08/31/2013  Job:  [id number]

## 2013-08-31 NOTE — Progress Notes (Signed)
INFECTIOUS DISEASE PROGRESS NOTE  ID: Lindsay [name] is a 62 y.o. female with  Principal Problem:   Cellulitis Active Problems:   OBSTRUCTIVE SLEEP APNEA   HTN (hypertension)  Subjective: Without complaints  Abtx:  Anti-infectives   Start     Dose/Rate Route Frequency Ordered Stop   08/27/13 1500  DAPTOmycin (CUBICIN) 760 mg in sodium chloride 0.9 % IVPB     8 mg/kg  95 kg 230.4 mL/hr over 30 Minutes Intravenous Every 24 hours 08/27/13 1317     08/27/13 1015  rifampin (RIFADIN) capsule 300 mg  Status:  Discontinued     300 mg Oral Every 12 hours 08/27/13 1006 08/27/13 1317   08/24/13 1400  ceFAZolin (ANCEF) IVPB 2 g/50 mL premix  Status:  Discontinued     2 g 100 mL/hr over 30 Minutes Intravenous 3 times per day 08/24/13 1101 08/27/13 1317   08/23/13 0945  DAPTOmycin (CUBICIN) 760 mg in sodium chloride 0.9 % IVPB  Status:  Discontinued     8 mg/kg  95 kg 230.4 mL/hr over 30 Minutes Intravenous Every 24 hours 08/23/13 0936 08/23/13 0937   08/23/13 0945  DAPTOmycin (CUBICIN) 570 mg in sodium chloride 0.9 % IVPB  Status:  Discontinued     6 mg/kg  95 kg 222.8 mL/hr over 30 Minutes Intravenous Every 24 hours 08/23/13 0937 08/24/13 1055   08/23/13 0930  clindamycin (CLEOCIN) IVPB 600 mg  Status:  Discontinued     600 mg 100 mL/hr over 30 Minutes Intravenous 3 times per day 08/23/13 0922 08/24/13 1055   08/22/13 0600  vancomycin (VANCOCIN) IVPB 1000 mg/200 mL premix  Status:  Discontinued     1,000 mg 200 mL/hr over 60 Minutes Intravenous Every 12 hours 08/21/13 2128 08/23/13 0923   08/21/13 2230  piperacillin-tazobactam (ZOSYN) IVPB 3.375 g  Status:  Discontinued     3.375 g 12.5 mL/hr over 240 Minutes Intravenous 3 times per day 08/21/13 2129 08/23/13 0923   08/21/13 1715  vancomycin (VANCOCIN) 1,750 mg in sodium chloride 0.9 % 500 mL IVPB     1,750 mg 250 mL/hr over 120 Minutes Intravenous STAT 08/21/13 1711 08/21/13 1924   08/21/13 1645  vancomycin (VANCOCIN) 20 mg/kg in  sodium chloride 0.9 % 100 mL IVPB  Status:  Discontinued     20 mg/kg 100 mL/hr over 60 Minutes Intravenous  Once 08/21/13 1630 08/21/13 1712      Medications:  Scheduled: . amLODipine  10 mg Oral Daily  . DAPTOmycin (CUBICIN)  IV  8 mg/kg Intravenous Q24H  . heparin subcutaneous  5,000 Units Subcutaneous Q8H    Objective: Vital signs in last 24 hours: Temp:  [97.1 F (36.2 C)-98.9 F (37.2 C)] 98.7 F (37.1 C) (12/17 0621) Pulse Rate:  [91-118] 91 (12/17 0621) Resp:  [15-25] 20 (12/17 0621) BP: (126-187)/(65-106) 134/73 mmHg (12/17 0621) SpO2:  [96 %-100 %] 96 % (12/17 0621)   General appearance: alert, cooperative and no distress Extremities: wound wrapped.   Lab Results  Recent Labs  08/29/13 0545 08/30/13 0451 08/31/13 1155  WBC 14.9* 16.5* 14.0*  HGB 11.1* 10.7* 9.5*  HCT 34.0* 33.0* 29.5*  NA 140 137  --   K 3.6 3.8  --   CL 104 104  --   CO2 24 22  --   BUN 11 10  --   CREATININE 0.89 0.87  --    Liver Panel No results found for this basename: PROT, ALBUMIN, AST, ALT, ALKPHOS, BILITOT, BILIDIR,  IBILI,  in the last 72 hours Sedimentation Rate No results found for this basename: ESRSEDRATE,  in the last 72 hours C-Reactive Protein No results found for this basename: CRP,  in the last 72 hours  Microbiology: Recent Results (from the past 240 hour(s))  WOUND CULTURE     Status: None   Collection Time    08/21/13  8:43 PM      Result Value Range Status   Specimen Description WOUND   Final   Special Requests RIGHT LATERAL CALF PATIENT ON FOLLOWING VANCOMYCIN   Final   Gram Stain     Final   Value: NO WBC SEEN     NO SQUAMOUS EPITHELIAL CELLS SEEN     FEW GRAM POSITIVE COCCI IN PAIRS     Performed at [facility]   Culture     Final   Value: MODERATE STAPHYLOCOCCUS AUREUS     Note: RIFAMPIN AND GENTAMICIN SHOULD NOT BE USED AS SINGLE DRUGS FOR TREATMENT OF STAPH INFECTIONS. This organism DOES NOT demonstrate inducible Clindamycin resistance  in vitro.     Performed at [facility]   Report Status 08/24/2013 FINAL   Final   Organism ID, Bacteria STAPHYLOCOCCUS AUREUS   Final  SURGICAL PCR SCREEN     Status: None   Collection Time    08/23/13  4:15 PM      Result Value Range Status   MRSA, PCR NEGATIVE  NEGATIVE Final   Staphylococcus aureus NEGATIVE  NEGATIVE Final   Comment:            The Xpert SA Assay (FDA     approved for NASAL specimens     in patients over 6 years of age),     is one component of     a comprehensive surveillance     program.  Test performance has     been validated by [employer] for patients greater     than or equal to 32 year old.     It is not intended     to diagnose infection nor to     guide or monitor treatment.  SURGICAL PCR SCREEN     Status: None   Collection Time    08/30/13  9:59 AM      Result Value Range Status   MRSA, PCR NEGATIVE  NEGATIVE Final   Staphylococcus aureus NEGATIVE  NEGATIVE Final   Comment:            The Xpert SA Assay (FDA     approved for NASAL specimens     in patients over 37 years of age),     is one component of     a comprehensive surveillance     program.  Test performance has     been validated by [employer] for patients greater     than or equal to 70 year old.     It is not intended     to diagnose infection nor to     guide or monitor treatment.    Studies/Results: No results found.   Assessment/Plan: Cellulitis, abscess RLE  MSSA  I & D 12-9, 12-14 I & D and skin grafting 12-17 ADR   Total days of antibiotics: 8 (dapto)   suggest she will need for 2-3 weeks Having some difficulty getting IV dapto for her at SNF- other options: Invanz,  ceftobiprole (cephalosporin rash), zyvox (expensive), clinda (high  risk of c diff), tigecycline (expensive and high rate of n/v). Could change her to oral bactrim or doxy if ok with surgery. She would need to be watched very closely.          [name] Infectious  Diseases (pager) [phone] [email or web address] 08/31/2013, 1:17 PM  LOS: 10 days

## 2013-08-31 NOTE — Progress Notes (Signed)
PATIENT DETAILS Name: Lindsay [name] Age: 62 y.o. Sex: female Date of Birth: Aug 14, 1951 Admit Date: 08/21/2013 Admitting Physician Eduard Clos, MD PCP:No PCP Per Patient  Subjective: No major issues.  Assessment/Plan: Principal Problem:   Cellulitis with abscess of the right lower extremity - Patient was admitted and started on vancomycin and Zosyn, hospital course was complicated by development of a rash. Orthopedics was consulted, patient underwent a debridement on 12/9 and on 12/13. Since patient continued to have a rash, which was possibly attributed to vancomycin she was switched to daptomycin and clindamycin. Since intraoperative cultures was positive for MSSA, she was switched to cefazolin, but again developed a pruritic rash. She was then switched back to daptomycin, today is day 8 of daptomycin. - Patient subsequently underwent a skin graft placement on 12/16. - Current plans are for orthopedic followup on 12/18, and placement of a PICC line with 11 more days of IV daptomycin from 12/17. - Spoke with Dr. Magnus Ivan today, he suggested to keep the patient in the hospital for one more day, he will evaluate the wound on 12/18 and then give final discharge recommendations.  Hypertension  Blood pressure is better controlled, continue amlodipine 10 mg by mouth daily.   Hypokalemia  Repleted orally   Pain management  As needed narcotics.  Disposition: Remain inpatient  DVT Prophylaxis: Prophylactic Heparin   Code Status: Full code  Family Communication None at bedside.  Procedures:  Incision and drainage on 12/9  Incision and drainage on 12/13  Skin graft on 12/16  CONSULTS:  ID and orthopedic surgery   MEDICATIONS: Scheduled Meds: . amLODipine  10 mg Oral Daily  . DAPTOmycin (CUBICIN)  IV  8 mg/kg Intravenous Q24H  . heparin subcutaneous  5,000 Units Subcutaneous Q8H   Continuous Infusions: . sodium chloride 20 mL/hr at 08/27/13 1212   PRN  Meds:.acetaminophen, acetaminophen, diphenhydrAMINE, hydrALAZINE, HYDROcodone-acetaminophen, HYDROmorphone (DILAUDID) injection, ondansetron (ZOFRAN) IV, ondansetron, oxyCODONE  Antibiotics: Anti-infectives   Start     Dose/Rate Route Frequency Ordered Stop   08/27/13 1500  DAPTOmycin (CUBICIN) 760 mg in sodium chloride 0.9 % IVPB     8 mg/kg  95 kg 230.4 mL/hr over 30 Minutes Intravenous Every 24 hours 08/27/13 1317     08/27/13 1015  rifampin (RIFADIN) capsule 300 mg  Status:  Discontinued     300 mg Oral Every 12 hours 08/27/13 1006 08/27/13 1317   08/24/13 1400  ceFAZolin (ANCEF) IVPB 2 g/50 mL premix  Status:  Discontinued     2 g 100 mL/hr over 30 Minutes Intravenous 3 times per day 08/24/13 1101 08/27/13 1317   08/23/13 0945  DAPTOmycin (CUBICIN) 760 mg in sodium chloride 0.9 % IVPB  Status:  Discontinued     8 mg/kg  95 kg 230.4 mL/hr over 30 Minutes Intravenous Every 24 hours 08/23/13 0936 08/23/13 0937   08/23/13 0945  DAPTOmycin (CUBICIN) 570 mg in sodium chloride 0.9 % IVPB  Status:  Discontinued     6 mg/kg  95 kg 222.8 mL/hr over 30 Minutes Intravenous Every 24 hours 08/23/13 0937 08/24/13 1055   08/23/13 0930  clindamycin (CLEOCIN) IVPB 600 mg  Status:  Discontinued     600 mg 100 mL/hr over 30 Minutes Intravenous 3 times per day 08/23/13 0922 08/24/13 1055   08/22/13 0600  vancomycin (VANCOCIN) IVPB 1000 mg/200 mL premix  Status:  Discontinued     1,000 mg 200 mL/hr over 60 Minutes Intravenous Every 12 hours 08/21/13 2128 08/23/13 1610  08/21/13 2230  piperacillin-tazobactam (ZOSYN) IVPB 3.375 g  Status:  Discontinued     3.375 g 12.5 mL/hr over 240 Minutes Intravenous 3 times per day 08/21/13 2129 08/23/13 0923   08/21/13 1715  vancomycin (VANCOCIN) 1,750 mg in sodium chloride 0.9 % 500 mL IVPB     1,750 mg 250 mL/hr over 120 Minutes Intravenous STAT 08/21/13 1711 08/21/13 1924   08/21/13 1645  vancomycin (VANCOCIN) 20 mg/kg in sodium chloride 0.9 % 100 mL IVPB   Status:  Discontinued     20 mg/kg 100 mL/hr over 60 Minutes Intravenous  Once 08/21/13 1630 08/21/13 1712       PHYSICAL EXAM: Vital signs in last 24 hours: Filed Vitals:   08/30/13 2000 08/30/13 2141 08/31/13 0044 08/31/13 0621  BP: 175/94 126/65  134/73  Pulse: 118 96 96 91  Temp: 97.1 F (36.2 C) 98.9 F (37.2 C)  98.7 F (37.1 C)  TempSrc:  Oral  Oral  Resp: 15 18 18 20   Height:      Weight:      SpO2: 100% 96% 96% 96%    Weight change:  Filed Weights   08/21/13 1702 08/21/13 2058  Weight: 94.348 kg (208 lb) 95.029 kg (209 lb 8 oz)   Body mass index is 36.43 kg/(m^2).   Gen Exam: Awake and alert with clear speech.   Neck: Supple, No JVD.   Chest: B/L Clear.   CVS: S1 S2 Regular, no murmurs.  Abdomen: soft, BS +, non tender, non distended.  Extremities: no edema, lower extremities warm to touch. Right leg bandaged it did not open Neurologic: Non Focal.   Skin: No Rash.   Wounds: N/A.   Intake/Output from previous day:  Intake/Output Summary (Last 24 hours) at 08/31/13 1025 Last data filed at 08/31/13 0120  Gross per 24 hour  Intake   1360 ml  Output      0 ml  Net   1360 ml     LAB RESULTS: CBC  Recent Labs Lab 08/26/13 0750 08/27/13 0455 08/28/13 0925 08/29/13 0545 08/30/13 0451  WBC 16.4* 15.9* 17.0* 14.9* 16.5*  HGB 12.0 11.8* 11.3* 11.1* 10.7*  HCT 36.5 35.9* 33.7* 34.0* 33.0*  PLT 427* 421* 445* 440* 429*  MCV 85.1 86.1 84.7 86.1 86.6  MCH 28.0 28.3 28.4 28.1 28.1  MCHC 32.9 32.9 33.5 32.6 32.4  RDW 14.4 14.4 14.4 14.3 14.4  LYMPHSABS  --  1.4  --   --   --   MONOABS  --  0.8  --   --   --   EOSABS  --  0.7  --   --   --   BASOSABS  --  0.0  --   --   --     Chemistries   Recent Labs Lab 08/25/13 0650 08/26/13 0750 08/29/13 0545 08/30/13 0451  NA 137 139 140 137  K 3.6 3.9 3.6 3.8  CL 103 102 104 104  CO2 23 25 24 22   GLUCOSE 109* 98 102* 96  BUN 15 15 11 10   CREATININE 1.03 1.05 0.89 0.87  CALCIUM 8.8 8.9 8.5 8.6     CBG:  Recent Labs Lab 08/27/13 0626  GLUCAP 101*    GFR Estimated Creatinine Clearance: 76.3 ml/min (by C-G formula based on Cr of 0.87).  Coagulation profile No results found for this basename: INR, PROTIME,  in the last 168 hours  Cardiac Enzymes No results found for this basename: CK, CKMB, TROPONINI, MYOGLOBIN,  in the last 168 hours  No components found with this basename: POCBNP,  No results found for this basename: DDIMER,  in the last 72 hours No results found for this basename: HGBA1C,  in the last 72 hours No results found for this basename: CHOL, HDL, LDLCALC, TRIG, CHOLHDL, LDLDIRECT,  in the last 72 hours No results found for this basename: TSH, T4TOTAL, FREET3, T3FREE, THYROIDAB,  in the last 72 hours No results found for this basename: VITAMINB12, FOLATE, FERRITIN, TIBC, IRON, RETICCTPCT,  in the last 72 hours No results found for this basename: LIPASE, AMYLASE,  in the last 72 hours  Urine Studies No results found for this basename: UACOL, UAPR, USPG, UPH, UTP, UGL, UKET, UBIL, UHGB, UNIT, UROB, ULEU, UEPI, UWBC, URBC, UBAC, CAST, CRYS, UCOM, BILUA,  in the last 72 hours  MICROBIOLOGY: Recent Results (from the past 240 hour(s))  WOUND CULTURE     Status: None   Collection Time    08/21/13  8:43 PM      Result Value Range Status   Specimen Description WOUND   Final   Special Requests RIGHT LATERAL CALF PATIENT ON FOLLOWING VANCOMYCIN   Final   Gram Stain     Final   Value: NO WBC SEEN     NO SQUAMOUS EPITHELIAL CELLS SEEN     FEW GRAM POSITIVE COCCI IN PAIRS     Performed at [facility]   Culture     Final   Value: MODERATE STAPHYLOCOCCUS AUREUS     Note: RIFAMPIN AND GENTAMICIN SHOULD NOT BE USED AS SINGLE DRUGS FOR TREATMENT OF STAPH INFECTIONS. This organism DOES NOT demonstrate inducible Clindamycin resistance in vitro.     Performed at [facility]   Report Status 08/24/2013 FINAL   Final   Organism ID, Bacteria STAPHYLOCOCCUS  AUREUS   Final  SURGICAL PCR SCREEN     Status: None   Collection Time    08/23/13  4:15 PM      Result Value Range Status   MRSA, PCR NEGATIVE  NEGATIVE Final   Staphylococcus aureus NEGATIVE  NEGATIVE Final   Comment:            The Xpert SA Assay (FDA     approved for NASAL specimens     in patients over 21 years of age),     is one component of     a comprehensive surveillance     program.  Test performance has     been validated by [employer] for patients greater     than or equal to 55 year old.     It is not intended     to diagnose infection nor to     guide or monitor treatment.  SURGICAL PCR SCREEN     Status: None   Collection Time    08/30/13  9:59 AM      Result Value Range Status   MRSA, PCR NEGATIVE  NEGATIVE Final   Staphylococcus aureus NEGATIVE  NEGATIVE Final   Comment:            The Xpert SA Assay (FDA     approved for NASAL specimens     in patients over 70 years of age),     is one component of     a comprehensive surveillance     program.  Test performance has     been validated by [employer] for  patients greater     than or equal to 35 year old.     It is not intended     to diagnose infection nor to     guide or monitor treatment.    RADIOLOGY STUDIES/RESULTS: Mr Tibia Fibula Right W Wo Contrast  08/22/2013   CLINICAL DATA:  Right lower leg cellulitis.  EXAM: MRI OF LOWER RIGHT EXTREMITY WITHOUT AND WITH CONTRAST  TECHNIQUE: Multiplanar, multisequence MR imaging of the lower right extremity was performed both before and after administration of intravenous contrast.  CONTRAST:  20mL MULTIHANCE GADOBENATE DIMEGLUMINE 529 MG/ML IV SOLN  COMPARISON:  None.  FINDINGS: There is focal subcutaneous edema and abnormal subcutaneous enhancement at the anterior medial aspect of the proximal right lower leg. The inflammation extends from approximately 4 cm below the knee joint to 26 cm below the knee joint.  The cellulitis is superficial and does not  extend below the fascia. There is no muscle abnormality. The underlying tibia is normal. There is no abscess.  The patient does have an area of nodularity of the skin at the lateral aspect of the lower leg centered approximately 23 cm below the knee. This area of nodularity is approximately 4 cm in diameter. The skin is thickened laterally. Does the patient have a chronic skin condition at that site? There is no pathologic enhancement at that area after contrast administration.  IMPRESSION: 1. Superficial cellulitis of the anterior medial aspect of the right lower leg with no abscess or osteomyelitis or myositis or deep fasciitis. 2. Nodular lesion on the skin of the lateral aspect of the right lower leg as described.   Electronically Signed   By: Geanie Cooley M.D.   On: 08/22/2013 11:59    Jeoffrey Massed, MD  [facility] Pager:[phone]  If 7PM-7AM, please contact night-coverage [email or web address] Password [email or web address] 08/31/2013, 10:25 AM   LOS: 10 days

## 2013-08-31 NOTE — Progress Notes (Signed)
Peripherally Inserted Central Catheter/Midline Placement  The IV Nurse has discussed with the patient and/or persons authorized to consent for the patient, the purpose of this procedure and the potential benefits and risks involved with this procedure.  The benefits include less needle sticks, lab draws from the catheter and patient may be discharged home with the catheter.  Risks include, but not limited to, infection, bleeding, blood clot (thrombus formation), and puncture of an artery; nerve damage and irregular heat beat.  Alternatives to this procedure were also discussed.  PICC/Midline Placement Documentation        [name] 08/31/2013, 11:16 AM

## 2013-08-31 NOTE — Progress Notes (Signed)
Physical Therapy Treatment Patient Details Name: Lindsay [name] MRN: [id number] DOB: 1951/08/18 Today's Date: 08/31/2013 Time: 4098-1191 PT Time Calculation (min): 17 min  PT Assessment / Plan / Recommendation  History of Present Illness Pt adm with infection to rt lower leg and has undergone multiple I&D's.  Pt recieved skin graft 08/30/13.   PT Comments   Patient is very tentative to move her RLE and especially to WB on it even after ensuring her that short ambulation is safe.  Patient tolerated walking to the restroom.  Patient has limited mobility and ambulation due to her pain and being tentative to use her RLE.  Patient will benefit from skilled PT intervention to maximize her mobility and safety with ambulation as she d/c home.  Patient will certainly need intermittent supervision to help her out at home as she continues her rehab at home.   Follow Up Recommendations  Home health PT;Supervision - Intermittent           Equipment Recommendations  Rolling walker with 5" wheels;Wheelchair (measurements PT)       Frequency Min 3X/week   Progress towards PT Goals Progress towards PT goals: Progressing toward goals  Plan Current plan remains appropriate    Precautions / Restrictions Precautions Precautions: Fall Restrictions Weight Bearing Restrictions: No   Pertinent Vitals/Pain Pain is 4/10 at start of session in her RLE; rested on an elevated pillow with leg elevated at end of session.    Mobility  Bed Mobility Bed Mobility: Supine to Sit Supine to Sit: 4: Min guard Details for Bed Mobility Assistance: Pt able to slowly move her LE to EOB while getting her trunk upright.  Patient requires extra time and relies heavily on the guard rails for assistance. Transfers Transfers: Sit to Stand;Stand to Sit Sit to Stand: 4: Min assist;With upper extremity assist;From bed Stand to Sit: 4: Min assist;With upper extremity assist;To chair/3-in-1 Details for Transfer Assistance:  Requires trunk assist at her hips to help through standing.  Pt is able to participate and do 90% of the work.  She relies on her UE and LLE for the majority of the work to stand up, and gradually shifts some weight onto RLE once standing upright in the RW. Ambulation/Gait Ambulation/Gait Assistance: 4: Min guard Ambulation Distance (Feet): 20 Feet (To the restroom) Assistive device: Rolling walker Ambulation/Gait Assistance Details: Patient is very tentative to WB on RLE.   She scoots it forward, the shifts minimal weight onto it as she quickly limps her L foot forwards.  She has the strength to clear her RLE, but is tentative to clear the floor by much since she is tentative to move it also.  She is in pain when WB, but is able to tolerate PWB throughout ambulation though she is encouraged to bear as much weight as she can tolerate. Gait Pattern: Step-to pattern;Decreased stride length Gait velocity: decreased due to tentative WB on LE General Gait Details: A for safety and support, along with VC for gait technique Stairs: No      PT Goals (current goals can now be found in the care plan section) Acute Rehab PT Goals Patient Stated Goal: return home PT Goal Formulation: With patient Time For Goal Achievement: 09/05/13 Potential to Achieve Goals: Good  Visit Information  Last PT Received On: 08/31/13 Assistance Needed: +1 History of Present Illness: Pt adm with infection to rt lower leg and has undergone multiple I&D's.  Pt recieved skin graft 08/30/13.    Subjective Data  Patient  Stated Goal: return home   Cognition  Cognition Arousal/Alertness: Awake/alert Behavior During Therapy: WFL for tasks assessed/performed Overall Cognitive Status: Within Functional Limits for tasks assessed    Balance  Balance Balance Assessed: No  End of Session PT - End of Session Equipment Utilized During Treatment: Gait belt Activity Tolerance: Patient limited by pain Patient left: in chair;with  call bell/phone within reach (pillow under foot. Education about elevating foot periodically) Nurse Communication: Mobility status   GP    Barrie Dunker, SPT Pager:  [phone]  Barrie Dunker 08/31/2013, 2:56 PM

## 2013-08-31 NOTE — Progress Notes (Signed)
Read, reviewed, and agree.  Azya Barbero B. Christina Gintz, PT, DPT #[phone]  

## 2013-08-31 NOTE — Progress Notes (Signed)
Spoke with patient about CPAP. Patient stated she wanted to wait a little while longer. Patient was instructed to let the nurse know when she was ready so I could come back.

## 2013-08-31 NOTE — Progress Notes (Signed)
Subjective: 1 Day Post-Op Procedure(s) (LRB): Repeat IRRIGATION AND DEBRIDEMENT Right leg, skin graft (Right) Patient reports pain as mild.   No complaints. Objective: Vital signs in last 24 hours: Temp:  [97.1 F (36.2 C)-98.9 F (37.2 C)] 98.7 F (37.1 C) (12/17 0621) Pulse Rate:  [91-118] 91 (12/17 0621) Resp:  [15-25] 20 (12/17 0621) BP: (126-187)/(65-106) 134/73 mmHg (12/17 0621) SpO2:  [96 %-100 %] 96 % (12/17 0621)  Intake/Output from previous day: 12/16 0701 - 12/17 0700 In: 1360 [P.O.:360; I.V.:1000] Out: -  Intake/Output this shift:     Recent Labs  08/29/13 0545 08/30/13 0451  HGB 11.1* 10.7*    Recent Labs  08/29/13 0545 08/30/13 0451  WBC 14.9* 16.5*  RBC 3.95 3.81*  HCT 34.0* 33.0*  PLT 440* 429*    Recent Labs  08/29/13 0545 08/30/13 0451  NA 140 137  K 3.6 3.8  CL 104 104  CO2 24 22  BUN 11 10  CREATININE 0.89 0.87  GLUCOSE 102* 96  CALCIUM 8.5 8.6   No results found for this basename: LABPT, INR,  in the last 72 hours  Neurovascular intact Dorsiflexion/Plantar flexion intact Incision: dressing C/D/I Compartment soft  Assessment/Plan: 1 Day Post-Op Procedure(s) (LRB): Repeat IRRIGATION AND DEBRIDEMENT Right leg, skin graft (Right)  Check CBC today . Will need to see WBC trending down prior to d/c. Continue current antibiotics. Leave dressing intact for approximately two days. Most likely d/c to home in next few days if wound improving.  Richardean Canal 08/31/2013, 9:55 AM

## 2013-08-31 NOTE — Progress Notes (Signed)
Occupational Therapy Treatment Patient Details Name: Lindsay [name] MRN: [id number] DOB: [birth year] Today's Date: 08/31/2013 Time: 1212-1225 OT Time Calculation (min): 13 min  OT Assessment / Plan / Recommendation  History of present illness Pt adm with infection to rt lower leg and has undergone multiple I&D's.  Pt for skin graft 08/30/13.   OT comments  Pt declined EOB/OOB activity today stating that  Dr Rayburn Ma told her to stay in bed for 24 hours after her surgery yesterday evening. Pt agreeable t ADL A/E education and demo. Pt to continue with acute OT services to increase level of function and safety. OT spoke with pt's nurse after session and nurse stated that pt is allowed to get OOB for toileting and recliner but has been stating that she is not supposed too  Follow Up Recommendations  Home health OT;Supervision - Intermittent    Barriers to Discharge   none    Equipment Recommendations  Other (comment) (ADL A/E)    Recommendations for Other Services    Frequency Min 2X/week   Progress towards OT Goals Progress towards OT goals: OT to reassess next treatment  Plan Discharge plan remains appropriate    Precautions / Restrictions Precautions Precautions: Fall Restrictions Weight Bearing Restrictions: No   Pertinent Vitals/Pain 3/10 R LE    ADL  Transfers/Ambulation Related to ADLs: pt declined EOB/OOB activity stating that Dr Rayburn Ma told her to stay in bed for 24 hours after her surgery yesterday evening ADL Comments: Pt agreeable to A/E education and demo.    OT Diagnosis:    OT Problem List:   OT Treatment Interventions:     OT Goals(current goals can now be found in the care plan section) Acute Rehab OT Goals Patient Stated Goal: return home  Visit Information  Last OT Received On: 08/31/13 History of Present Illness: Pt adm with infection to rt lower leg and has undergone multiple I&D's.  Pt for skin graft 08/30/13.    Subjective Data      Prior  Functioning       Cognition  Cognition Arousal/Alertness: Awake/alert Behavior During Therapy: WFL for tasks assessed/performed Overall Cognitive Status: Within Functional Limits for tasks assessed    Mobility  Bed Mobility Bed Mobility: Not assessed Details for Bed Mobility Assistance: pt declined EOB/OOB activity stating that Dr Rayburn Ma told her to stay in bed for 24 hours after her surgery yesterday evening Transfers Transfers: Not assessed Details for Transfer Assistance: pt declined EOB/OOB activity stating that Dr Rayburn Ma told her to stay in bed for 24 hours after her surgery yesterday evening    Exercises      Balance Balance Balance Assessed: No   End of Session OT - End of Session Patient left: with call bell/phone within reach;in bed  GO     Galen Manila 08/31/2013, 2:09 PM

## 2013-09-01 ENCOUNTER — Encounter (HOSPITAL_COMMUNITY): Payer: Self-pay | Admitting: Orthopaedic Surgery

## 2013-09-01 MED ORDER — SODIUM CHLORIDE 0.9 % IV SOLN: 1.0000 g | INTRAVENOUS | Status: DC

## 2013-09-01 MED ORDER — ONDANSETRON HCL 4 MG PO TABS: 4.0000 mg | ORAL_TABLET | Freq: Four times a day (QID) | ORAL | Status: DC | PRN

## 2013-09-01 MED ORDER — SODIUM CHLORIDE 0.9 % IV SOLN
1.0000 g | INTRAVENOUS | Status: DC
  Administered 2013-09-01: 1 g via INTRAVENOUS
  Filled 2013-09-01: qty 1

## 2013-09-01 MED ORDER — OXYCODONE HCL 5 MG PO TABS: 5.0000 mg | ORAL_TABLET | ORAL | Status: DC | PRN

## 2013-09-01 MED ORDER — HEPARIN SOD (PORK) LOCK FLUSH 100 UNIT/ML IV SOLN
250.0000 [IU] | INTRAVENOUS | Status: AC | PRN
  Administered 2013-09-01: 250 [IU]

## 2013-09-01 MED ORDER — AMLODIPINE BESYLATE 10 MG PO TABS: 10.0000 mg | ORAL_TABLET | Freq: Every day | ORAL | Status: DC

## 2013-09-01 NOTE — Progress Notes (Signed)
Patient ID: Lindsay [name], female   DOB: 09-Jul-1951, 62 y.o.   MRN: [id number] Dressing changed over right leg wound and split-thickness skin graft looks good with good take.  Can discharge to home from Ortho standpoint with daily dressing changes of xeroform and dry dressing to right leg wound.  Follow-up in 2 weeks.

## 2013-09-01 NOTE — Progress Notes (Signed)
Physical Therapy Treatment Patient Details Name: Lindsay [name] MRN: [id number] DOB: 23-Aug-1951 Today's Date: 09/01/2013 Time: 7829-5621 PT Time Calculation (min): 32 min  PT Assessment / Plan / Recommendation  History of Present Illness Pt adm with infection to rt lower leg and has undergone multiple I&D's.  Pt recieved skin graft 08/30/13.   PT Comments   Began stair training today with MIN/guard A with sideways technique.  Pt making good progress towards goals.  She is increasing Wb through R LE, but will need continued training on proper gait pattern and technique.  Follow Up Recommendations  Home health PT;Supervision - Intermittent     Does the patient have the potential to tolerate intense rehabilitation     Barriers to Discharge        Equipment Recommendations  Rolling walker with 5" wheels;Wheelchair (measurements PT)    Recommendations for Other Services    Frequency Min 3X/week   Progress towards PT Goals Progress towards PT goals: Goals met and updated - see care plan (stair goal met)  Plan Current plan remains appropriate    Precautions / Restrictions Precautions Precautions: Fall Restrictions Weight Bearing Restrictions: No   Pertinent Vitals/Pain 6/10 with WB    Mobility  Transfers Transfers: Sit to Stand;Stand to Sit Sit to Stand: 4: Min guard;With upper extremity assist;From chair/3-in-1;From toilet Stand to Sit: 4: Min guard;With upper extremity assist;To chair/3-in-1;To toilet Details for Transfer Assistance:  (Pt with slow transfer & takes increased time to stand erect) Ambulation/Gait Ambulation/Gait Assistance: 4: Min guard;5: Supervision Ambulation Distance (Feet): 20 Feet (20 feet, 5 feet, 10 feet) Assistive device: Rolling walker Ambulation/Gait Assistance Details: Increased WB through R LE today, but will need continued training on safe technique with gait. Gait Pattern: Step-to pattern;Decreased stride length General Gait Details: MIN /guard  intially.  S in room for short distance. Stairs: Yes Stairs Assistance: 4: Min guard Stair Management Technique: One rail Right;Sideways;Step to pattern Number of Stairs: 4    Exercises     PT Diagnosis:    PT Problem List:   PT Treatment Interventions:     PT Goals (current goals can now be found in the care plan section) Acute Rehab PT Goals Patient Stated Goal: return home  Visit Information  Last PT Received On: 09/01/13 Assistance Needed: +1 History of Present Illness: Pt adm with infection to rt lower leg and has undergone multiple I&D's.  Pt recieved skin graft 08/30/13.    Subjective Data  Subjective: I may go home today. Patient Stated Goal: return home   Cognition  Cognition Arousal/Alertness: Awake/alert Behavior During Therapy: WFL for tasks assessed/performed Overall Cognitive Status: Within Functional Limits for tasks assessed    Balance     End of Session PT - End of Session Equipment Utilized During Treatment: Gait belt Activity Tolerance: Patient tolerated treatment well Patient left: in chair;with call bell/phone within reach   GP     [facility] 09/01/2013, 10:19 AM

## 2013-09-01 NOTE — Discharge Summary (Signed)
PATIENT DETAILS Name: Lindsay [name] Age: 62 y.o. Sex: female Date of Birth: 07-15-1951 MRN: [id number]. Admit Date: 08/21/2013 Admitting Physician: Eduard Clos, MD PCP:No PCP Per Patient  Recommendations for Outpatient Follow-up:  1. Complete additional 14 days of intravenous Invanz from 09/01/13  PRIMARY DISCHARGE DIAGNOSIS:  Principal Problem:   Cellulitis with abscess of the right lower extremity Active Problems:   OBSTRUCTIVE SLEEP APNEA   HTN (hypertension)   Hypokalemia      PAST MEDICAL HISTORY: Past Medical History  Diagnosis Date  . Hypertension     DISCHARGE MEDICATIONS:   Medication List         amLODipine 10 MG tablet  Commonly known as:  NORVASC  Take 1 tablet (10 mg total) by mouth daily.     ondansetron 4 MG tablet  Commonly known as:  ZOFRAN  Take 1 tablet (4 mg total) by mouth every 6 (six) hours as needed for nausea.     oxyCODONE 5 MG immediate release tablet  Commonly known as:  Oxy IR/ROXICODONE  Take 1-2 tablets (5-10 mg total) by mouth every 3 (three) hours as needed for severe pain.     sodium chloride 0.9 % SOLN 50 mL with ertapenem 1 G SOLR 1 g  Inject 1 g into the vein daily. For 14 more days        ALLERGIES:   Allergies  Allergen Reactions  . Vancomycin Rash    BRIEF HPI:  See H&P, Labs, Consult and Test reports for all details in brief, patient is a 62 year old female with history of hypertension and obstructive sleep apnea who presented to the ED on 12/7 for right leg swelling and discharge. He was thought to have cellulitis, admitted to the hospital for further evaluation and treatment.  CONSULTATIONS:   ID and orthopedic surgery  PERTINENT RADIOLOGIC STUDIES: Mr Tibia Fibula Right W Wo Contrast  08/22/2013   CLINICAL DATA:  Right lower leg cellulitis.  EXAM: MRI OF LOWER RIGHT EXTREMITY WITHOUT AND WITH CONTRAST  TECHNIQUE: Multiplanar, multisequence MR imaging of the lower right extremity was performed both  before and after administration of intravenous contrast.  CONTRAST:  20mL MULTIHANCE GADOBENATE DIMEGLUMINE 529 MG/ML IV SOLN  COMPARISON:  None.  FINDINGS: There is focal subcutaneous edema and abnormal subcutaneous enhancement at the anterior medial aspect of the proximal right lower leg. The inflammation extends from approximately 4 cm below the knee joint to 26 cm below the knee joint.  The cellulitis is superficial and does not extend below the fascia. There is no muscle abnormality. The underlying tibia is normal. There is no abscess.  The patient does have an area of nodularity of the skin at the lateral aspect of the lower leg centered approximately 23 cm below the knee. This area of nodularity is approximately 4 cm in diameter. The skin is thickened laterally. Does the patient have a chronic skin condition at that site? There is no pathologic enhancement at that area after contrast administration.  IMPRESSION: 1. Superficial cellulitis of the anterior medial aspect of the right lower leg with no abscess or osteomyelitis or myositis or deep fasciitis. 2. Nodular lesion on the skin of the lateral aspect of the right lower leg as described.   Electronically Signed   By: Geanie Cooley M.D.   On: 08/22/2013 11:59     PERTINENT LAB RESULTS: CBC:  Recent Labs  08/30/13 0451 08/31/13 1155  WBC 16.5* 14.0*  HGB 10.7* 9.5*  HCT 33.0* 29.5*  PLT  429* 417*   CMET CMP     Component Value Date/Time   NA 137 08/30/2013 0451   K 3.8 08/30/2013 0451   CL 104 08/30/2013 0451   CO2 22 08/30/2013 0451   GLUCOSE 96 08/30/2013 0451   BUN 10 08/30/2013 0451   CREATININE 0.87 08/30/2013 0451   CALCIUM 8.6 08/30/2013 0451   PROT 7.6 08/22/2013 0710   ALBUMIN 3.0* 08/22/2013 0710   AST 11 08/22/2013 0710   ALT 11 08/22/2013 0710   ALKPHOS 69 08/22/2013 0710   BILITOT 0.6 08/22/2013 0710   GFRNONAA 71* 08/30/2013 0451   GFRAA 82* 08/30/2013 0451    GFR Estimated Creatinine Clearance: 76.3 ml/min (by  C-G formula based on Cr of 0.87). No results found for this basename: LIPASE, AMYLASE,  in the last 72 hours No results found for this basename: CKTOTAL, CKMB, CKMBINDEX, TROPONINI,  in the last 72 hours No components found with this basename: POCBNP,  No results found for this basename: DDIMER,  in the last 72 hours No results found for this basename: HGBA1C,  in the last 72 hours No results found for this basename: CHOL, HDL, LDLCALC, TRIG, CHOLHDL, LDLDIRECT,  in the last 72 hours No results found for this basename: TSH, T4TOTAL, FREET3, T3FREE, THYROIDAB,  in the last 72 hours No results found for this basename: VITAMINB12, FOLATE, FERRITIN, TIBC, IRON, RETICCTPCT,  in the last 72 hours Coags: No results found for this basename: PT, INR,  in the last 72 hours Microbiology: Recent Results (from the past 240 hour(s))  SURGICAL PCR SCREEN     Status: None   Collection Time    08/23/13  4:15 PM      Result Value Range Status   MRSA, PCR NEGATIVE  NEGATIVE Final   Staphylococcus aureus NEGATIVE  NEGATIVE Final   Comment:            The Xpert SA Assay (FDA     approved for NASAL specimens     in patients over 11 years of age),     is one component of     a comprehensive surveillance     program.  Test performance has     been validated by [employer] for patients greater     than or equal to 29 year old.     It is not intended     to diagnose infection nor to     guide or monitor treatment.  SURGICAL PCR SCREEN     Status: None   Collection Time    08/30/13  9:59 AM      Result Value Range Status   MRSA, PCR NEGATIVE  NEGATIVE Final   Staphylococcus aureus NEGATIVE  NEGATIVE Final   Comment:            The Xpert SA Assay (FDA     approved for NASAL specimens     in patients over 83 years of age),     is one component of     a comprehensive surveillance     program.  Test performance has     been validated by [employer] for patients greater     than or equal to 3  year old.     It is not intended     to diagnose infection nor to     guide or monitor treatment.     BRIEF HOSPITAL COURSE:  Cellulitis with abscess of  the right lower extremity  - Patient was admitted and started on vancomycin and Zosyn, hospital course was complicated by development of a rash. Orthopedics was consulted, patient underwent a debridement on 12/9 and on 12/13. Since patient continued to have a rash, which was possibly attributed to vancomycin she was switched to daptomycin and clindamycin. Since intraoperative cultures was positive for MSSA, she was switched to cefazolin, but again developed a pruritic rash. She was then switched back to daptomycin, and subsequently completed almost 8 days. After discussion with infectious disease, patient a PICC line was placed, initially the plans were for the patient to be discharged on daptomycin, however because of insurance issues this was going to be very expensive for the patient. After discussion with infectious disease, we'll now switch her to intravenous Invanz. She will need 14 more days of intravenous Invanz from 09/01/13. - Please also note that the patient subsequently underwent a skin graft placement on 12/16. Patient was seen by Dr. Magnus Ivan on 12/18 and cleared for discharge. - Patient claims that her pain is adequately controlled with oral narcotic therapy. We will continue that on discharge as well.   Hypertension  Blood pressure is better controlled, continue amlodipine 10 mg by mouth daily on discharge  Hypokalemia  Repleted orally   TODAY-DAY OF DISCHARGE:  Subjective:   Lindsay [name] today has no headache,no chest abdominal pain,no new weakness tingling or numbness, feels much better wants to go home today.   Objective:   Blood pressure 153/80, pulse 91, temperature 99.1 F (37.3 C), temperature source Oral, resp. rate 20, height 5' 3.6" (1.615 m), weight 95.029 kg (209 lb 8 oz), SpO2 100.00%.  Intake/Output  Summary (Last 24 hours) at 09/01/13 1056 Last data filed at 09/01/13 0500  Gross per 24 hour  Intake    753 ml  Output      0 ml  Net    753 ml   Filed Weights   08/21/13 1702 08/21/13 2058  Weight: 94.348 kg (208 lb) 95.029 kg (209 lb 8 oz)    Exam Awake Alert, Oriented *3, No new F.N deficits, Normal affect [place].AT,PERRAL Supple Neck,No JVD, No cervical lymphadenopathy appriciated.  Symmetrical Chest wall movement, Good air movement bilaterally, CTAB RRR,No Gallops,Rubs or new Murmurs, No Parasternal Heave +ve B.Sounds, Abd Soft, Non tender, No organomegaly appriciated, No rebound -guarding or rigidity. No Cyanosis, Clubbing or edema, No new Rash or bruise  DISCHARGE CONDITION: Stable  DISPOSITION: Home with home health services  DISCHARGE INSTRUCTIONS:    Activity:  As tolerated with Full fall precautions use walker/cane & assistance as needed  Diet recommendation: Heart Healthy diet       Discharge Orders   Future Orders Complete By Expires   Call MD for:  redness, tenderness, or signs of infection (pain, swelling, redness, odor or green/yellow discharge around incision site)  As directed    Call MD for:  severe uncontrolled pain  As directed    Call MD for:  temperature >100.4  As directed    Diet - low sodium heart healthy  As directed    Increase activity slowly  As directed       Follow-up Information   Follow up with Truman Hayward, NP On 09/27/2013. (2:30 you will see Nurse Practitioner first then will be scheduled with MD, please bring insurance card, photo id and co pay.)    Specialty:  Psychiatry   Contact information:   [address] [place] [address] [phone]  Follow up with Kathryne Hitch, MD. Schedule an appointment as soon as possible for a visit in 2 weeks.   Specialty:  Orthopedic Surgery   Contact information:   [address] [place] [address] [phone]       Follow up with [name], MD. Schedule an appointment as soon as possible for a visit in 2 weeks.   Specialty:  Infectious Diseases   Contact information:   [address]  [address] [place] [address] [phone]      Total Time spent on discharge equals 45 minutes.  SignedJeoffrey Massed 09/01/2013 10:56 AM

## 2013-09-01 NOTE — Progress Notes (Signed)
INFECTIOUS DISEASE PROGRESS NOTE  ID: Lindsay [name] is a 62 y.o. female with  Principal Problem:   Cellulitis Active Problems:   OBSTRUCTIVE SLEEP APNEA   HTN (hypertension)  Subjective: Without complaints  Abtx:  Anti-infectives   Start     Dose/Rate Route Frequency Ordered Stop   08/27/13 1500  DAPTOmycin (CUBICIN) 760 mg in sodium chloride 0.9 % IVPB  Status:  Discontinued     8 mg/kg  95 kg 230.4 mL/hr over 30 Minutes Intravenous Every 24 hours 08/27/13 1317 09/01/13 0958   08/27/13 1015  rifampin (RIFADIN) capsule 300 mg  Status:  Discontinued     300 mg Oral Every 12 hours 08/27/13 1006 08/27/13 1317   08/24/13 1400  ceFAZolin (ANCEF) IVPB 2 g/50 mL premix  Status:  Discontinued     2 g 100 mL/hr over 30 Minutes Intravenous 3 times per day 08/24/13 1101 08/27/13 1317   08/23/13 0945  DAPTOmycin (CUBICIN) 760 mg in sodium chloride 0.9 % IVPB  Status:  Discontinued     8 mg/kg  95 kg 230.4 mL/hr over 30 Minutes Intravenous Every 24 hours 08/23/13 0936 08/23/13 0937   08/23/13 0945  DAPTOmycin (CUBICIN) 570 mg in sodium chloride 0.9 % IVPB  Status:  Discontinued     6 mg/kg  95 kg 222.8 mL/hr over 30 Minutes Intravenous Every 24 hours 08/23/13 0937 08/24/13 1055   08/23/13 0930  clindamycin (CLEOCIN) IVPB 600 mg  Status:  Discontinued     600 mg 100 mL/hr over 30 Minutes Intravenous 3 times per day 08/23/13 0922 08/24/13 1055   08/22/13 0600  vancomycin (VANCOCIN) IVPB 1000 mg/200 mL premix  Status:  Discontinued     1,000 mg 200 mL/hr over 60 Minutes Intravenous Every 12 hours 08/21/13 2128 08/23/13 0923   08/21/13 2230  piperacillin-tazobactam (ZOSYN) IVPB 3.375 g  Status:  Discontinued     3.375 g 12.5 mL/hr over 240 Minutes Intravenous 3 times per day 08/21/13 2129 08/23/13 0923   08/21/13 1715  vancomycin (VANCOCIN) 1,750 mg in sodium chloride 0.9 % 500 mL IVPB     1,750 mg 250 mL/hr over 120 Minutes Intravenous STAT 08/21/13 1711 08/21/13 1924   08/21/13 1645   vancomycin (VANCOCIN) 20 mg/kg in sodium chloride 0.9 % 100 mL IVPB  Status:  Discontinued     20 mg/kg 100 mL/hr over 60 Minutes Intravenous  Once 08/21/13 1630 08/21/13 1712      Medications:  Scheduled: . amLODipine  10 mg Oral Daily  . heparin subcutaneous  5,000 Units Subcutaneous Q8H    Objective: Vital signs in last 24 hours: Temp:  [99.1 F (37.3 C)-99.2 F (37.3 C)] 99.1 F (37.3 C) (12/18 1610) Pulse Rate:  [91-105] 91 (12/18 0633) Resp:  [18-20] 20 (12/18 0633) BP: (143-170)/(80-90) 153/80 mmHg (12/18 0633) SpO2:  [100 %] 100 % (12/18 9604)   General appearance: alert, cooperative and no distress Extremities: RLE wrapped.   Lab Results  Recent Labs  08/30/13 0451 08/31/13 1155  WBC 16.5* 14.0*  HGB 10.7* 9.5*  HCT 33.0* 29.5*  NA 137  --   K 3.8  --   CL 104  --   CO2 22  --   BUN 10  --   CREATININE 0.87  --    Liver Panel No results found for this basename: PROT, ALBUMIN, AST, ALT, ALKPHOS, BILITOT, BILIDIR, IBILI,  in the last 72 hours Sedimentation Rate No results found for this basename: ESRSEDRATE,  in the  last 72 hours C-Reactive Protein No results found for this basename: CRP,  in the last 72 hours  Microbiology: Recent Results (from the past 240 hour(s))  SURGICAL PCR SCREEN     Status: None   Collection Time    08/23/13  4:15 PM      Result Value Range Status   MRSA, PCR NEGATIVE  NEGATIVE Final   Staphylococcus aureus NEGATIVE  NEGATIVE Final   Comment:            The Xpert SA Assay (FDA     approved for NASAL specimens     in patients over 77 years of age),     is one component of     a comprehensive surveillance     program.  Test performance has     been validated by [employer] for patients greater     than or equal to 56 year old.     It is not intended     to diagnose infection nor to     guide or monitor treatment.  SURGICAL PCR SCREEN     Status: None   Collection Time    08/30/13  9:59 AM      Result Value  Range Status   MRSA, PCR NEGATIVE  NEGATIVE Final   Staphylococcus aureus NEGATIVE  NEGATIVE Final   Comment:            The Xpert SA Assay (FDA     approved for NASAL specimens     in patients over 68 years of age),     is one component of     a comprehensive surveillance     program.  Test performance has     been validated by [employer] for patients greater     than or equal to 63 year old.     It is not intended     to diagnose infection nor to     guide or monitor treatment.    Studies/Results: No results found.   Assessment/Plan: Cellulitis, abscess RLE  MSSA  I & D 12-9, 12-14  I & D and skin grafting 12-17  ADR   Total days of antibiotics: 9 dapto --> invanz Would continue invanz for 2 more weeks.  For d/c today.  Will have her f/u in ID clinic.          [name] Infectious Diseases (pager) [phone] [email or web address] 09/01/2013, 10:06 AM  LOS: 11 days

## 2013-09-01 NOTE — Progress Notes (Signed)
NURSING PROGRESS NOTE  Skilynn Durney [id number] Discharge Data: 09/01/2013 5:01 PM Attending Provider: No att. providers found PCP:No PCP Per Patient     Jenean Lindau to be D/C'd Home per MD order.    PICC line left in place for home antibiotics provided by [facility].   All belongings returned to patient for patient to take home.   Last Vital Signs:  Blood pressure 132/73, pulse 98, temperature 102 F (38.9 C), temperature source Oral, resp. rate 20, height 5' 3.6" (1.615 m), weight 95.029 kg (209 lb 8 oz), SpO2 98.00%.  Discharge Medication List   Medication List         amLODipine 10 MG tablet  Commonly known as:  NORVASC  Take 1 tablet (10 mg total) by mouth daily.     ondansetron 4 MG tablet  Commonly known as:  ZOFRAN  Take 1 tablet (4 mg total) by mouth every 6 (six) hours as needed for nausea.     oxyCODONE 5 MG immediate release tablet  Commonly known as:  Oxy IR/ROXICODONE  Take 1-2 tablets (5-10 mg total) by mouth every 3 (three) hours as needed for severe pain.     sodium chloride 0.9 % SOLN 50 mL with ertapenem 1 G SOLR 1 g  Inject 1 g into the vein daily. For 14 more days        Madelin Rear, MSN, RN, [facility]

## 2013-09-02 ENCOUNTER — Encounter (HOSPITAL_COMMUNITY): Payer: Self-pay | Admitting: Orthopaedic Surgery

## 2013-09-02 NOTE — OR Nursing (Signed)
09/02/2013 11:52 On patient [id number] D:Implants that were implanted were corrected and the one that was wasted was corrected  A:Completed in addendum log of Op-Time R:Chart reflects the corrected the implants that were placed in patient.  Peterson Lombard RN

## 2013-09-16 ENCOUNTER — Telehealth: Payer: Self-pay | Admitting: *Deleted

## 2013-09-16 NOTE — Telephone Encounter (Signed)
Lab draw 09/13/13 showed BUN=25 and creatinine=1.52.  Last results were from 12/16 and WNL (BUN=10, creatinine=0.87).  Patient on Invanz. Per Jeani Hawking, pharmacist at [facility], these values have been trending up.  He will send nursing to redraw today.  Her calculated creatinine clearance is 58.9. No change per pharmacy as of this time. Landis Gandy, RN

## 2013-09-21 ENCOUNTER — Encounter: Payer: Self-pay | Admitting: Infectious Disease

## 2013-09-21 ENCOUNTER — Ambulatory Visit (INDEPENDENT_AMBULATORY_CARE_PROVIDER_SITE_OTHER): Payer: 59 | Admitting: Infectious Disease

## 2013-09-21 VITALS — BP 148/88 | HR 116 | Temp 98.5°F | Wt 205.0 lb

## 2013-09-21 DIAGNOSIS — L0390 Cellulitis, unspecified: Secondary | ICD-10-CM

## 2013-09-21 DIAGNOSIS — D696 Thrombocytopenia, unspecified: Secondary | ICD-10-CM

## 2013-09-21 DIAGNOSIS — D702 Other drug-induced agranulocytosis: Secondary | ICD-10-CM | POA: Insufficient documentation

## 2013-09-21 DIAGNOSIS — A4901 Methicillin susceptible Staphylococcus aureus infection, unspecified site: Secondary | ICD-10-CM

## 2013-09-21 DIAGNOSIS — D649 Anemia, unspecified: Secondary | ICD-10-CM

## 2013-09-21 DIAGNOSIS — L0291 Cutaneous abscess, unspecified: Secondary | ICD-10-CM

## 2013-09-21 LAB — CBC WITH DIFFERENTIAL/PLATELET
Basophils Absolute: 0.1 10*3/uL (ref 0.0–0.1)
Basophils Relative: 1 % (ref 0–1)
Eosinophils Absolute: 0.2 10*3/uL (ref 0.0–0.7)
Eosinophils Relative: 3 % (ref 0–5)
HCT: 35.6 % — ABNORMAL LOW (ref 36.0–46.0)
Hemoglobin: 11.4 g/dL — ABNORMAL LOW (ref 12.0–15.0)
Lymphocytes Relative: 39 % (ref 12–46)
Lymphs Abs: 2.1 10*3/uL (ref 0.7–4.0)
MCH: 28.3 pg (ref 26.0–34.0)
MCHC: 32 g/dL (ref 30.0–36.0)
MCV: 88.3 fL (ref 78.0–100.0)
Monocytes Absolute: 0.3 10*3/uL (ref 0.1–1.0)
Monocytes Relative: 6 % (ref 3–12)
Neutro Abs: 2.7 10*3/uL (ref 1.7–7.7)
Neutrophils Relative %: 51 % (ref 43–77)
Platelets: 389 10*3/uL (ref 150–400)
RBC: 4.03 MIL/uL (ref 3.87–5.11)
RDW: 15.8 % — ABNORMAL HIGH (ref 11.5–15.5)
WBC: 5.4 10*3/uL (ref 4.0–10.5)

## 2013-09-21 MED ORDER — DOXYCYCLINE HYCLATE 100 MG PO TABS: 100.0000 mg | ORAL_TABLET | Freq: Two times a day (BID) | ORAL | Status: DC

## 2013-09-21 NOTE — Progress Notes (Signed)
   Subjective:    Patient ID: Lindsay [name], female    DOB: 1951/06/06, 63 y.o.   MRN: [id number]  HPI  63 year old African American lady with purulent cellulitis with MSSA sp I and D by Dr. Ninfa Linden. The pt was treated with various antibiotics in house including vancomycin which caused Red mans' possibly rash and ancef which caused a rash. She was ultimately DC on IV invanz and has had very nice improvement in wound per pt and family member. I have labs from [facility] yesterday when she had some problems with blood clotting apparently. Labs showed wbc down to 2.5, hgb down to 7.7, ANC to 1.5 and platelets to 86  She had last dose of invanz on Saturday and has had continued IV heparin flushes and saline flushes. No fevers, nausea malaise, fatigue, light headedness dizziness, bruising.  Review of Systems  Constitutional: Negative for fever, chills, diaphoresis, activity change, appetite change, fatigue and unexpected weight change.  HENT: Negative for congestion, rhinorrhea, sinus pressure, sneezing, sore throat and trouble swallowing.   Eyes: Negative for photophobia and visual disturbance.  Respiratory: Negative for cough, chest tightness, shortness of breath, wheezing and stridor.   Cardiovascular: Negative for chest pain, palpitations and leg swelling.  Gastrointestinal: Negative for nausea, vomiting, abdominal pain, diarrhea, constipation, blood in stool, abdominal distention and anal bleeding.  Genitourinary: Negative for dysuria, hematuria, flank pain and difficulty urinating.  Musculoskeletal: Negative for arthralgias, back pain, gait problem, joint swelling and myalgias.  Skin: Positive for wound. Negative for color change, pallor and rash.  Neurological: Negative for dizziness, tremors, weakness and light-headedness.  Hematological: Negative for adenopathy. Does not bruise/bleed easily.  Psychiatric/Behavioral: Negative for behavioral problems, confusion, sleep disturbance, dysphoric mood,  decreased concentration and agitation.       Objective:   Physical Exam  Constitutional: She is oriented to person, place, and time. She appears well-developed and well-nourished. No distress.  HENT:  Head: Normocephalic and atraumatic.  Mouth/Throat: Oropharynx is clear and moist. No oropharyngeal exudate.  Eyes: Conjunctivae and EOM are normal.  Neck: Normal range of motion. Neck supple.  Cardiovascular: Normal rate and regular rhythm.   Pulmonary/Chest: Effort normal and breath sounds normal. No respiratory distress. She has no wheezes. She has no rales. She exhibits no tenderness.  Abdominal: She exhibits no distension. There is no tenderness. There is no rebound.  Musculoskeletal: She exhibits no edema and no tenderness.  Neurological: She is alert and oriented to person, place, and time. She exhibits normal muscle tone. Coordination normal.  Skin: Skin is warm and dry. She is not diaphoretic. There is erythema. No pallor.     Psychiatric: She has a normal mood and affect. Her behavior is normal. Judgment and thought content normal.   right leg ulcer    picc site           Assessment & Plan:   Severe cellulitis: due to MSSA: DC invanz Due to concerns of drug induced pancytopenia. Start doxycyline rtc in 2 weeks  I spent greater than 25 minutes with the patient including greater than 50% of time in face to face counsel of the patient and in coordination of their care.   Pancytopenia; could this be lab error, or beta lactam induced pancytopenia? --recheck stat cbc  Also check HIT panel in case there is component of heparin induced thrombocytopenia

## 2013-09-23 LAB — HEPARIN INDUCED THROMBOCYTOPENIA PNL
Heparin Induced Plt Ab: POSITIVE — AB
Patient O.D.: 1.163
UFH High Dose UFH H: 0 % Release
UFH Low Dose 0.1 IU/mL: 0 % Release
UFH Low Dose 0.5 IU/mL: 0 % Release
UFH SRA Result: NEGATIVE

## 2013-09-24 ENCOUNTER — Telehealth: Payer: Self-pay | Admitting: Infectious Diseases

## 2013-09-24 ENCOUNTER — Encounter: Payer: Self-pay | Admitting: Infectious Diseases

## 2013-09-24 DIAGNOSIS — D7582 Heparin induced thrombocytopenia (HIT): Secondary | ICD-10-CM | POA: Insufficient documentation

## 2013-09-24 DIAGNOSIS — D75829 Heparin-induced thrombocytopenia, unspecified: Secondary | ICD-10-CM | POA: Insufficient documentation

## 2013-09-24 NOTE — Telephone Encounter (Signed)
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 She is now off IV anbx, and off heparin flushes.  Of note, her repeat CBC did not show thrombocytopenia. Suspect her home health blood draw was an erorr.  Will add HIT to her problem list and her allergies.  Called pt to make sure she is not longer getting heprain.

## 2013-09-26 NOTE — Telephone Encounter (Signed)
Thanks Jeff.

## 2013-10-03 ENCOUNTER — Other Ambulatory Visit: Payer: Self-pay | Admitting: *Deleted

## 2013-10-03 ENCOUNTER — Telehealth: Payer: Self-pay | Admitting: *Deleted

## 2013-10-03 DIAGNOSIS — I10 Essential (primary) hypertension: Secondary | ICD-10-CM

## 2013-10-03 MED ORDER — AMLODIPINE BESYLATE 10 MG PO TABS: 10.0000 mg | ORAL_TABLET | Freq: Every day | ORAL | Status: DC

## 2013-10-03 NOTE — Telephone Encounter (Signed)
Thank you :)

## 2013-10-03 NOTE — Telephone Encounter (Signed)
That is OK, I dont understand why no one established her with [facility]?

## 2013-10-03 NOTE — Telephone Encounter (Signed)
Refill sent.  She should be establishing care today.  Thank you!

## 2013-10-03 NOTE — Telephone Encounter (Signed)
Patient called asking if Dr. Tommy Medal would refill her HTN medication.  Patient does not yet have a PCP.  RN gave her recommendations, patient will contact [facility] to set up an appointment.  Would she be able to get a one-time refill on the norvasc today? Please advise. Landis Gandy, RN

## 2013-10-05 ENCOUNTER — Ambulatory Visit (INDEPENDENT_AMBULATORY_CARE_PROVIDER_SITE_OTHER): Payer: 59 | Admitting: Infectious Disease

## 2013-10-05 ENCOUNTER — Encounter: Payer: Self-pay | Admitting: Infectious Disease

## 2013-10-05 VITALS — BP 158/104 | HR 118 | Temp 98.1°F | Ht 63.0 in | Wt 198.0 lb

## 2013-10-05 DIAGNOSIS — L0291 Cutaneous abscess, unspecified: Secondary | ICD-10-CM

## 2013-10-05 DIAGNOSIS — D61818 Other pancytopenia: Secondary | ICD-10-CM

## 2013-10-05 DIAGNOSIS — D75829 Heparin-induced thrombocytopenia, unspecified: Secondary | ICD-10-CM

## 2013-10-05 DIAGNOSIS — L03119 Cellulitis of unspecified part of limb: Secondary | ICD-10-CM

## 2013-10-05 DIAGNOSIS — A4901 Methicillin susceptible Staphylococcus aureus infection, unspecified site: Secondary | ICD-10-CM

## 2013-10-05 DIAGNOSIS — D7582 Heparin induced thrombocytopenia (HIT): Secondary | ICD-10-CM

## 2013-10-05 DIAGNOSIS — L02419 Cutaneous abscess of limb, unspecified: Secondary | ICD-10-CM

## 2013-10-05 DIAGNOSIS — L0390 Cellulitis, unspecified: Secondary | ICD-10-CM

## 2013-10-05 NOTE — Progress Notes (Signed)
Subjective:    Patient ID: Lindsay [name], female    DOB: Aug 26, 1951, 63 y.o.   MRN: [id number]  HPI   63 year old African American lady with purulent cellulitis with MSSA sp I and D by Dr. Ninfa Linden. The pt was treated with various antibiotics in house including vancomycin which caused Red mans' possibly rash and ancef which caused a rash. She was ultimately DC on IV invanz and has had very nice improvement in wound per pt and family member. I had  labs from [facility] yesterday when she had some problems with blood clotting apparently. Labs showed wbc down to 2.5, hgb down to 7.7, ANC to 1.5 and platelets to 86  She had last dose of invanz on Saturday prior  and has had continued IV heparin flushes and saline flushes.  We rechecked all of her labs and they were all completely normal. We did check a hit antibody which was positive. 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  , Place her on doxycycline 100 mg twice daily and she is continued on this.  He believes the wound is improving although continues to have a fair amount of exudate on the wound as seen today in picture. She is being followed closely by wound care and is seeing Dr. Ninfa Linden as well the    No fevers, nausea malaise, fatigue, light headedness dizziness, bruising.    Review of Systems  Constitutional: Negative for fever, chills, diaphoresis, activity change, appetite change, fatigue and unexpected weight change.  HENT: Negative for congestion, rhinorrhea, sinus pressure, sneezing, sore throat and trouble swallowing.   Eyes: Negative for photophobia and visual disturbance.  Respiratory: Negative for cough, chest tightness, shortness of breath, wheezing and stridor.   Cardiovascular: Negative for chest pain, palpitations and leg swelling.  Gastrointestinal: Negative for nausea, vomiting, abdominal pain, diarrhea,  constipation, blood in stool, abdominal distention and anal bleeding.  Genitourinary: Negative for dysuria, hematuria, flank pain and difficulty urinating.  Musculoskeletal: Negative for arthralgias, back pain, gait problem, joint swelling and myalgias.  Skin: Positive for wound. Negative for color change, pallor and rash.  Neurological: Negative for dizziness, tremors, weakness and light-headedness.  Hematological: Negative for adenopathy. Does not bruise/bleed easily.  Psychiatric/Behavioral: Negative for behavioral problems, confusion, sleep disturbance, dysphoric mood, decreased concentration and agitation.       Objective:   Physical Exam  Constitutional: She is oriented to person, place, and time. She appears well-developed and well-nourished. No distress.  HENT:  Head: Normocephalic and atraumatic.  Mouth/Throat: Oropharynx is clear and moist. No oropharyngeal exudate.  Eyes: Conjunctivae and EOM are normal.  Neck: Normal range of motion. Neck supple.  Cardiovascular: Normal rate and regular rhythm.   Pulmonary/Chest: Effort normal and breath sounds normal. No respiratory distress. She has no wheezes. She has no rales. She exhibits no tenderness.  Abdominal: She exhibits no distension. There is no tenderness. There is no rebound.  Musculoskeletal: She exhibits no edema and no tenderness.  Neurological: She is alert and oriented to person, place, and time. She exhibits normal muscle tone. Coordination normal.  Skin: Skin is warm and dry. She is not diaphoretic. There is erythema. No pallor.     Psychiatric: She has a normal mood and affect. Her behavior is normal. Judgment and thought content normal.   right leg ulcer  09/21/13 picture with exudate:    10/05/13 picture=  todays picture               Assessment & Plan:   Severe cellulitis: due to MSSA (based on culture): rx w  invanz (dc'd)  Due to concerns of drug induced pancytopenia--which were more likely lab  error. She has been ordered on doxycyline  I'm not terribly impressed that the wound is improving. It is very unusual to quite a fungating appearance. Almost has the appearance that seen with pyoderma gangrenosum.  I took a culture from swabbing the surface of the wound we'll see what the results show. We'll see what the culture shows L. consider whether or not to target any organisms we isolate from such culture on including staph aureus and keeping in mind potential drug allergies. I spent greater than 25 minutes with the patient including greater than 50% of time in face to face counsel of the patient and in coordination of their care.   Pancytopenia; I think this must of been a lab error,    HIT ab positive: Update her allergy list this may be a false positive test.

## 2013-10-08 LAB — WOUND CULTURE: Gram Stain: NONE SEEN

## 2013-10-10 ENCOUNTER — Encounter (HOSPITAL_BASED_OUTPATIENT_CLINIC_OR_DEPARTMENT_OTHER): Payer: 59 | Attending: Plastic Surgery

## 2013-10-10 ENCOUNTER — Telehealth: Payer: Self-pay | Admitting: Infectious Disease

## 2013-10-10 DIAGNOSIS — L918 Other hypertrophic disorders of the skin: Secondary | ICD-10-CM | POA: Insufficient documentation

## 2013-10-10 DIAGNOSIS — I999 Unspecified disorder of circulatory system: Secondary | ICD-10-CM | POA: Insufficient documentation

## 2013-10-10 DIAGNOSIS — I10 Essential (primary) hypertension: Secondary | ICD-10-CM | POA: Insufficient documentation

## 2013-10-10 DIAGNOSIS — M7989 Other specified soft tissue disorders: Secondary | ICD-10-CM | POA: Insufficient documentation

## 2013-10-10 DIAGNOSIS — L97809 Non-pressure chronic ulcer of other part of unspecified lower leg with unspecified severity: Secondary | ICD-10-CM | POA: Insufficient documentation

## 2013-10-10 MED ORDER — CIPROFLOXACIN HCL 500 MG PO TABS: 500.0000 mg | ORAL_TABLET | Freq: Two times a day (BID) | ORAL | Status: DC

## 2013-10-10 NOTE — Telephone Encounter (Signed)
I left a message on the patient's voicemail with the directions.

## 2013-10-10 NOTE — Telephone Encounter (Signed)
Pt growing pseudomonas needs to start cipro 500mg  bid  Can you have her pick this up from [facility] and start this with her doxycycline being continued as well?

## 2013-10-11 NOTE — Progress Notes (Signed)
[facility]  NAME:  Lindsay [name], Lindsay [name]              ACCOUNT NO.:  [id number]  MEDICAL RECORD NO.:  [id number]      DATE OF BIRTH:  Apr 23, 1951  PHYSICIAN:  Theodoro Kos, DO       VISIT DATE:  10/10/2013                                  OFFICE VISIT   The patient is a 63 year old female who is here for evaluation of a right distal leg chronic ulcer.  She has undergone debridement in the past with Orthopedic Surgery, Dr. Ninfa Linden.  There is no clear understanding of how this started but she has been dealing with it for several weeks.  Currently, she has had TheraSkin placed on it.  Her past medical history is positive for hypertension.  SURGICAL HISTORY:  I and D of the leg.  MEDICATIONS:  Currently doxycycline and Norvasc.  She was on IV antibiotics but has since been taken off.  SHE IS ALLERGIC TO VANCOMYCIN.  REVIEW OF SYSTEMS:  Otherwise negative.  SOCIAL HISTORY:  Lives at home.  Does not smoke.  Otherwise in good health.  GENERAL:  On exam, she is alert, oriented, cooperative, very pleasant. HEENT:  Pupils are equal.  Extraocular muscles are intact.  No cervical lymphadenopathy. LUNGS:  Her breathing is unlabored. HEART:  Her heart rate is regular. ABDOMEN:  Soft. EXTREMITIES:  She has got quite a bit of swelling in the foot and leg area and the wound has a lot of fibrous tissue, some exudate, and hypergranulation.  Debridement was done and noted in the chart.  She does not have very good palpable pulse on the right foot and has some obvious vascular disease.  Recommendation is for, elevation, multivitamin, vitamin C, zinc, high- protein diet.  We will check a pre-albumin and send her to Vascular for evaluation.  We will use Santyl and Unna boot, and we may need to go to the OR depending on how it looks next week.  We will be able to evaluate the information and decide further course of action.    Theodoro Kos, DO    CS/MEDQ  D:  10/10/2013   T:  10/11/2013  Job:  [id number]

## 2013-10-11 NOTE — Progress Notes (Signed)
Spoke with patient today and she did not get my message from yesterday, she will pick up the medication today and start taking it.

## 2013-10-17 ENCOUNTER — Encounter (HOSPITAL_BASED_OUTPATIENT_CLINIC_OR_DEPARTMENT_OTHER): Payer: 59 | Attending: Plastic Surgery

## 2013-10-17 DIAGNOSIS — L97809 Non-pressure chronic ulcer of other part of unspecified lower leg with unspecified severity: Secondary | ICD-10-CM | POA: Insufficient documentation

## 2013-10-18 ENCOUNTER — Encounter: Payer: Self-pay | Admitting: Cardiovascular Disease

## 2013-10-18 ENCOUNTER — Ambulatory Visit (INDEPENDENT_AMBULATORY_CARE_PROVIDER_SITE_OTHER): Payer: 59 | Admitting: Cardiovascular Disease

## 2013-10-18 VITALS — BP 142/86 | HR 88 | Ht 63.0 in | Wt 209.0 lb

## 2013-10-18 DIAGNOSIS — S81809A Unspecified open wound, unspecified lower leg, initial encounter: Secondary | ICD-10-CM

## 2013-10-18 DIAGNOSIS — S81801A Unspecified open wound, right lower leg, initial encounter: Secondary | ICD-10-CM

## 2013-10-18 DIAGNOSIS — S91009A Unspecified open wound, unspecified ankle, initial encounter: Secondary | ICD-10-CM

## 2013-10-18 DIAGNOSIS — S81009A Unspecified open wound, unspecified knee, initial encounter: Secondary | ICD-10-CM

## 2013-10-18 NOTE — Assessment & Plan Note (Addendum)
The patient was sent by Dr. Steele Sizer at the [facility] wound care center for evaluation of PAD. The patient has a two-month history of right lower extremity cellulitis. She was hospitalized for possibly 10 days back in December and evaluated by the infectious disease service. She was found to have methicillin sensitive staph aureus prior to antibiotics. She had allergic reactions to vancomycin as well as Ancef. She has no other cardiovascular risk factors. She denied claudication prior to her index event. She has intact pedal pulses. I doubt whether his symptoms and disease process is vascular in nature but more likely infectious. I'm going to get arterial and venous Doppler studies.

## 2013-10-18 NOTE — Progress Notes (Signed)
10/18/2013 Lindsay [name]   09-06-1951  [id number]  Primary Physician No PCP Per Patient Primary Cardiologist: Lorretta Harp MD Renae Gloss   HPI:  Ms. Quraishi is a 63 year old moderately overweight divorced African American female mother of 3,  referred by Dr. Steele Sizer  for peripheral vascular evaluation because of right lower extremity cellulitis. She basically has no other cardiovascular risk factor per other than to hypertension. She does have hyperlipidemia not on medications. She is not diabetic nor does she smoke. There is no family history of heart disease. She's never had a heart attack or stroke patient denied claudication prior to this. She works as an Runner, broadcasting/film/video at [employer] Around Thanksgiving she developed a small "knot" on her right leg which ultimately progressed to a large ulcer. She was hospitalized and treated for cellulitis with methicillin  sensitive staph aureus.   Current Outpatient Prescriptions  Medication Sig Dispense Refill  . amLODipine (NORVASC) 10 MG tablet Take 1 tablet (10 mg total) by mouth daily.  30 tablet  0  . ciprofloxacin (CIPRO) 500 MG tablet Take 1 tablet (500 mg total) by mouth 2 (two) times daily.  60 tablet  1  . doxycycline (VIBRA-TABS) 100 MG tablet Take 1 tablet (100 mg total) by mouth 2 (two) times daily.  60 tablet  1  . ondansetron (ZOFRAN) 4 MG tablet Take 1 tablet (4 mg total) by mouth every 6 (six) hours as needed for nausea.  20 tablet  0  . oxyCODONE (OXY IR/ROXICODONE) 5 MG immediate release tablet Take 1-2 tablets (5-10 mg total) by mouth every 3 (three) hours as needed for severe pain.  30 tablet  0   No current facility-administered medications for this visit.    Allergies  Allergen Reactions  . Ancef [Cefazolin] Rash  . Heparin     Possible HIT  . Vancomycin Rash    History   Social History  . Marital Status: Divorced    Spouse Name: N/A    Number of Children: N/A  . Years of  Education: N/A   Occupational History  . Not on file.   Social History Main Topics  . Smoking status: Never Smoker   . Smokeless tobacco: Never Used  . Alcohol Use: No  . Drug Use: No  . Sexual Activity: Not on file   Other Topics Concern  . Not on file   Social History Narrative  . No narrative on file     Review of Systems: General: negative for chills, fever, night sweats or weight changes.  Cardiovascular: negative for chest pain, dyspnea on exertion, edema, orthopnea, palpitations, paroxysmal nocturnal dyspnea or shortness of breath Dermatological: negative for rash Respiratory: negative for cough or wheezing Urologic: negative for hematuria Abdominal: negative for nausea, vomiting, diarrhea, bright red blood per rectum, melena, or hematemesis Neurologic: negative for visual changes, syncope, or dizziness All other systems reviewed and are otherwise negative except as noted above.    Blood pressure 142/86, pulse 88, height 5\' 3"  (1.6 m), weight 209 lb (94.802 kg).  General appearance: alert and no distress Neck: no adenopathy, no carotid bruit, no JVD, supple, symmetrical, trachea midline and thyroid not enlarged, symmetric, no tenderness/mass/nodules Lungs: clear to auscultation bilaterally Heart: regular rate and rhythm, S1, S2 normal, no murmur, click, rub or gallop Extremities: extremities normal, atraumatic, no cyanosis or edema and 22+ pedal pulses bilaterally. Large ulceration lateral aspect of right leg most likely infectious  EKG not performed today  ASSESSMENT  AND PLAN:   Cellulitis The patient was sent by Dr. Steele Sizer at the [facility] wound care center for evaluation of PAD. The patient has a two-month history of right lower extremity cellulitis. She was hospitalized for possibly 10 days back in December and evaluated by the infectious disease service. She was found to have methicillin sensitive staph aureus prior to antibiotics. She had allergic  reactions to vancomycin as well as Ancef. She has no other cardiovascular risk factors. She denied claudication prior to her index event. She has intact pedal pulses. I doubt whether his symptoms and disease process is vascular in nature but more likely infectious. I'm going to get arterial and venous Doppler studies.      Lorretta Harp MD FACP,FACC,FAHA, [facility] 10/18/2013 12:14 PM

## 2013-10-18 NOTE — Patient Instructions (Signed)
  We will see you back in follow up in only as needed.  Dr Gwenlyn Found has ordered lower extremity arterial dopplers, bilaterally, and lower extremity venous dopplers on the right leg.

## 2013-10-19 ENCOUNTER — Telehealth (HOSPITAL_COMMUNITY): Payer: Self-pay | Admitting: *Deleted

## 2013-10-20 ENCOUNTER — Ambulatory Visit (HOSPITAL_COMMUNITY)
Admission: RE | Admit: 2013-10-20 | Discharge: 2013-10-20 | Disposition: A | Discharge status: Home / Self Care | Payer: 59 | Source: Ambulatory Visit | Attending: Cardiovascular Disease | Admitting: Cardiovascular Disease

## 2013-10-20 DIAGNOSIS — L97909 Non-pressure chronic ulcer of unspecified part of unspecified lower leg with unspecified severity: Secondary | ICD-10-CM | POA: Insufficient documentation

## 2013-10-20 DIAGNOSIS — S81801A Unspecified open wound, right lower leg, initial encounter: Secondary | ICD-10-CM

## 2013-10-20 DIAGNOSIS — L02419 Cutaneous abscess of limb, unspecified: Secondary | ICD-10-CM

## 2013-10-20 DIAGNOSIS — L03119 Cellulitis of unspecified part of limb: Secondary | ICD-10-CM

## 2013-10-20 NOTE — Progress Notes (Signed)
Lower Extremity Arterial Duplex Completed. °Brianna L Mazza,RVT °

## 2013-10-25 ENCOUNTER — Telehealth (HOSPITAL_COMMUNITY): Payer: Self-pay | Admitting: *Deleted

## 2013-10-28 ENCOUNTER — Encounter: Payer: Self-pay | Admitting: *Deleted

## 2013-10-31 ENCOUNTER — Ambulatory Visit (HOSPITAL_COMMUNITY)
Admission: RE | Admit: 2013-10-31 | Discharge: 2013-10-31 | Disposition: A | Discharge status: Home / Self Care | Payer: 59 | Source: Ambulatory Visit | Attending: Internal Medicine | Admitting: Internal Medicine

## 2013-10-31 DIAGNOSIS — M7989 Other specified soft tissue disorders: Secondary | ICD-10-CM | POA: Insufficient documentation

## 2013-10-31 DIAGNOSIS — L0390 Cellulitis, unspecified: Secondary | ICD-10-CM

## 2013-10-31 DIAGNOSIS — M79609 Pain in unspecified limb: Secondary | ICD-10-CM

## 2013-10-31 DIAGNOSIS — S81801A Unspecified open wound, right lower leg, initial encounter: Secondary | ICD-10-CM

## 2013-10-31 NOTE — Progress Notes (Signed)
Right lower Ext. Venous Duplex Completed. Negative for DVT and Venous Insufficiency. Oda Cogan, BS, RDMS, RVT

## 2013-11-03 ENCOUNTER — Ambulatory Visit: Payer: 59 | Admitting: Infectious Disease

## 2013-11-08 NOTE — Progress Notes (Signed)
[facility]  NAME:  Lindsay [name], HOCEVAR                   ACCOUNT NO.:  MEDICAL RECORD NO.:  [id number]      DATE OF BIRTH:  08-11-1951  PHYSICIAN:  Theodoro Kos, DO       VISIT DATE:  11/07/2013                                  OFFICE VISIT   The patient is a 63 year old female who is here for a followup on her right leg ulcer.  She has been in the [employer] and is very pleased with her progress.  She has got a lot of epithelialization and healing taking place mostly on the lateral aspect.  Her pre-albumin came back at 23, and her vascular labs showed no significant abnormalities.  No change in her medications or social history.  On exam, she is alert, oriented, cooperative, not in any acute distress.  She is pleasant.  Pupils are equal.  Her breathing is unlabored.  The wound is markedly improved with the numbers noted in the chart.  We will continue with the Unna boot. We also gave her a prescription for compression stockings, and I have encouraged her to bring them with her next week.  Also elevation, multivitamin, vitamin C, and zinc are very important.     Theodoro Kos, DO     CS/MEDQ  D:  11/07/2013  T:  11/08/2013  Job:  [id number]

## 2013-11-13 ENCOUNTER — Encounter: Payer: Self-pay | Admitting: *Deleted

## 2013-11-14 ENCOUNTER — Encounter (HOSPITAL_BASED_OUTPATIENT_CLINIC_OR_DEPARTMENT_OTHER): Payer: 59 | Attending: Plastic Surgery

## 2013-11-14 DIAGNOSIS — L97809 Non-pressure chronic ulcer of other part of unspecified lower leg with unspecified severity: Secondary | ICD-10-CM | POA: Insufficient documentation

## 2013-11-17 ENCOUNTER — Encounter: Payer: Self-pay | Admitting: Infectious Disease

## 2013-11-17 ENCOUNTER — Encounter: Payer: Self-pay | Admitting: Infectious Diseases

## 2013-11-17 ENCOUNTER — Ambulatory Visit (INDEPENDENT_AMBULATORY_CARE_PROVIDER_SITE_OTHER): Payer: 59 | Admitting: Infectious Disease

## 2013-11-17 VITALS — BP 168/116 | HR 108 | Temp 98.2°F | Ht 63.0 in | Wt 215.0 lb

## 2013-11-17 DIAGNOSIS — I10 Essential (primary) hypertension: Secondary | ICD-10-CM

## 2013-11-17 DIAGNOSIS — L0291 Cutaneous abscess, unspecified: Secondary | ICD-10-CM

## 2013-11-17 DIAGNOSIS — D61818 Other pancytopenia: Secondary | ICD-10-CM

## 2013-11-17 DIAGNOSIS — L0390 Cellulitis, unspecified: Secondary | ICD-10-CM

## 2013-11-17 DIAGNOSIS — A498 Other bacterial infections of unspecified site: Secondary | ICD-10-CM

## 2013-11-17 DIAGNOSIS — A4901 Methicillin susceptible Staphylococcus aureus infection, unspecified site: Secondary | ICD-10-CM

## 2013-11-17 DIAGNOSIS — B965 Pseudomonas (aeruginosa) (mallei) (pseudomallei) as the cause of diseases classified elsewhere: Secondary | ICD-10-CM

## 2013-11-17 MED ORDER — DOXYCYCLINE HYCLATE 100 MG PO TABS: 100.0000 mg | ORAL_TABLET | Freq: Two times a day (BID) | ORAL | Status: DC

## 2013-11-17 MED ORDER — METRONIDAZOLE 500 MG PO TABS: 500.0000 mg | ORAL_TABLET | Freq: Three times a day (TID) | ORAL | Status: DC

## 2013-11-17 MED ORDER — AMLODIPINE BESYLATE 10 MG PO TABS: 10.0000 mg | ORAL_TABLET | Freq: Every day | ORAL | Status: DC

## 2013-11-17 MED ORDER — CIPROFLOXACIN HCL 500 MG PO TABS: 500.0000 mg | ORAL_TABLET | Freq: Two times a day (BID) | ORAL | Status: DC

## 2013-11-17 NOTE — Progress Notes (Signed)
Subjective:    Patient ID: Lindsay [name], female    DOB: September 04, 1951, 63 y.o.   MRN: [id number]  HPI   63 year old African American lady with purulent cellulitis with MSSA sp I and D by Dr. Ninfa Linden. The pt was treated with various antibiotics in house including vancomycin which caused Red mans' possibly rash and ancef which caused a rash. She was ultimately DC on IV invanz and has had very nice improvement in wound per pt and family member.     I had placed her on doxycycline 100 mg twice daily and she is continued on this. The wound appeared to be improving although she  continued to have a fair amount of exudate. I cultured this material and it yielded pseudomonas on superficial culture.  She has continued on both doxy and cipro and has been evaluated by Dr. Alvester Chou for possible PVD.  She continues to be closely followed by wound clinic and Dr Migdalia Dk  The wound is shrinking though today it smelled much worse than I can recall. She believes this is because she is having the dressing changed less frequently per wound care instructions.    No fevers, nausea malaise, fatigue, light headedness dizziness, bruising.    Review of Systems  Constitutional: Negative for fever, chills, diaphoresis, activity change, appetite change, fatigue and unexpected weight change.  HENT: Negative for congestion, rhinorrhea, sinus pressure, sneezing, sore throat and trouble swallowing.   Eyes: Negative for photophobia and visual disturbance.  Respiratory: Negative for cough, chest tightness, shortness of breath, wheezing and stridor.   Cardiovascular: Negative for chest pain, palpitations and leg swelling.  Gastrointestinal: Negative for nausea, vomiting, abdominal pain, diarrhea, constipation, blood in stool, abdominal distention and anal bleeding.  Genitourinary: Negative for dysuria, hematuria, flank pain and difficulty urinating.  Musculoskeletal: Negative for arthralgias, back pain, gait problem, joint  swelling and myalgias.  Skin: Positive for wound. Negative for color change, pallor and rash.  Neurological: Negative for dizziness, tremors, weakness and light-headedness.  Hematological: Negative for adenopathy. Does not bruise/bleed easily.  Psychiatric/Behavioral: Negative for behavioral problems, confusion, sleep disturbance, dysphoric mood, decreased concentration and agitation.       Objective:   Physical Exam  Constitutional: She is oriented to person, place, and time. She appears well-developed and well-nourished. No distress.  HENT:  Head: Normocephalic and atraumatic.  Mouth/Throat: Oropharynx is clear and moist. No oropharyngeal exudate.  Eyes: Conjunctivae and EOM are normal.  Neck: Normal range of motion. Neck supple.  Cardiovascular: Normal rate and regular rhythm.   Pulmonary/Chest: Effort normal and breath sounds normal. No respiratory distress. She has no wheezes. She has no rales. She exhibits no tenderness.  Abdominal: She exhibits no distension. There is no tenderness. There is no rebound.  Musculoskeletal: She exhibits no edema and no tenderness.  Neurological: She is alert and oriented to person, place, and time. She exhibits normal muscle tone. Coordination normal.  Skin: Skin is warm and dry. She is not diaphoretic. There is erythema. No pallor.     Psychiatric: She has a normal mood and affect. Her behavior is normal. Judgment and thought content normal.   right leg ulcer  Sequentail apperance of wound  09/21/13 picture with exudate:    10/05/13 picture: with almost exophytic appearance and increased exudate      11/17/13: Area has shrunk though foul smelling             Assessment & Plan:   Severe cellulitis: ?  MSSA (based oni  nitial  culture): rx w  invanz (dc'd)  Changed to doxy but then seemed to worsen, cipro added and now with improved appearance though with foul smelling material  --addd TID flagyl to cipro and doxy (if she has  trouble tolerating the 3 abx, dc the doxy --rtc in one month . I spent greater than 25 minutes with the patient including greater than 50% of time in face to face counsel of the patient and in coordination of their care.  HTN: pt asked for me to write for her amlodipine as she still has not pCP. rx with 11 refills written  Pancytopenia; I think this must of been a lab error,    .

## 2013-11-23 ENCOUNTER — Ambulatory Visit: Payer: 59 | Admitting: Cardiovascular Disease

## 2013-11-28 NOTE — Progress Notes (Signed)
[facility]  NAME:  RAPHAEL, ESPE                   ACCOUNT NO.:  MEDICAL RECORD NO.:  [id number]      DATE OF BIRTH:  October 06, 1950  PHYSICIAN:  Theodoro Kos, DO       VISIT DATE:  11/28/2013                                  OFFICE VISIT   The patient is a 63 year old who is here for followup on right leg ulcer.  Overall, the area is improved with dimensions seen in the chart. There has been no change in her medications or social history.  She has been wearing her compression on the opposite foot.  Elevation, protein, and multivitamins.  PHYSICAL EXAMINATION:  GENERAL:  She is alert, oriented, cooperative, not in any acute distress.  She is pleasant. HEENT:  Pupils are equal.  Extraocular muscles are intact. NECK:  No cervical lymphadenopathy. RESPIRATIONS:  Breathing is unlabored. HEART:  Rate is regular. ABDOMEN Soft.  The wound is improved with the size noted in the chart.  We will continue with collagen.  She did qualify for the Apligraf but co-pay is more than she is able to do at this time.  So, we will see if we can apply for Oasis.     Theodoro Kos, DO     CS/MEDQ  D:  11/28/2013  T:  11/28/2013  Job:  [id number]

## 2013-12-05 NOTE — Progress Notes (Signed)
[facility]  NAME:  Lindsay [name], Lindsay [name]                   ACCOUNT NO.:  MEDICAL RECORD NO.:  [id number]      DATE OF BIRTH:  Oct 26, 1950  PHYSICIAN:  Theodoro Kos, DO       VISIT DATE:  12/05/2013                                  OFFICE VISIT   HISTORY OF PRESENT ILLNESS:  The patient is a 63 year old here for followup on her right lower extremity ulcer.  Overall, she is doing very well.  We have been using collagen on the area.  We have applied for Oasis and are waiting to hear back.  It is showing signs of improvement both in depth and in overall size.  There is no change in medications or social history.  REVIEW OF SYSTEMS:  Otherwise negative.  PHYSICAL EXAMINATION:  On exam, she is alert, oriented, cooperative, not in any acute distress.  Her breathing is unlabored.  Her abdomen is soft.  Her leg is less swollen than it had been with improvement in the wound.  We will continue with the collagen and still encouraged multivitamin, vitamin C, zinc, and protein.     Theodoro Kos, DO     CS/MEDQ  D:  12/05/2013  T:  12/05/2013  Job:  [id number]

## 2013-12-12 ENCOUNTER — Other Ambulatory Visit: Payer: 59

## 2013-12-15 ENCOUNTER — Ambulatory Visit: Payer: 59 | Admitting: Infectious Disease

## 2013-12-19 ENCOUNTER — Encounter (HOSPITAL_BASED_OUTPATIENT_CLINIC_OR_DEPARTMENT_OTHER): Payer: 59 | Attending: Plastic Surgery

## 2013-12-19 DIAGNOSIS — I872 Venous insufficiency (chronic) (peripheral): Secondary | ICD-10-CM | POA: Insufficient documentation

## 2013-12-19 DIAGNOSIS — L97809 Non-pressure chronic ulcer of other part of unspecified lower leg with unspecified severity: Secondary | ICD-10-CM | POA: Insufficient documentation

## 2013-12-19 NOTE — Progress Notes (Signed)
[facility]  NAME:  Lindsay [name], Lindsay [name]              ACCOUNT NO.:  [id number]  MEDICAL RECORD NO.:  [id number]      DATE OF BIRTH:  [birth year]  PHYSICIAN:  Theodoro Kos, DO       VISIT DATE:  12/19/2013                                  OFFICE VISIT   The patient is a 63 year old female who is here for followup on her right lower extremity and chronic venous insufficiency ulcer.  She is doing extremely well and and has completely healed the area using the [employer].  She has not gotten fitted for a stocking yet because the Unna boot was on, so they were not able to measure it, but she is going to go and get that fitted for today.  She is alert, oriented, cooperative, not in any acute distress.  No change in her medications or social history.  She is very pleased with her progress and the area is completely healed.  She has hypopigmentation as expected for the healing process and these may improve in time. She is going to continue to keep it elevated;  multivitamin, vitamin C, and zinc; and work on getting a compression stocking.  In the meantime, while she is awaiting for it to come in, she will wrap it with an Ace wrap.     Theodoro Kos, DO     CS/MEDQ  D:  12/19/2013  T:  12/19/2013  Job:  [id number]

## 2013-12-27 ENCOUNTER — Ambulatory Visit: Payer: 59 | Admitting: Internal Medicine

## 2014-01-05 ENCOUNTER — Encounter: Payer: Self-pay | Admitting: Infectious Diseases

## 2014-05-31 ENCOUNTER — Encounter (HOSPITAL_COMMUNITY): Payer: Self-pay | Admitting: Emergency Medicine

## 2014-05-31 ENCOUNTER — Inpatient Hospital Stay (HOSPITAL_COMMUNITY)
Admission: EM | Admit: 2014-05-31 | Discharge: 2014-06-03 | DRG: 872 | Disposition: A | Discharge status: Home / Self Care | Payer: 59 | Attending: Internal Medicine | Admitting: Internal Medicine

## 2014-05-31 ENCOUNTER — Emergency Department (HOSPITAL_COMMUNITY): Payer: 59

## 2014-05-31 DIAGNOSIS — I10 Essential (primary) hypertension: Secondary | ICD-10-CM

## 2014-05-31 DIAGNOSIS — Y9210 Unspecified residential institution as the place of occurrence of the external cause: Secondary | ICD-10-CM | POA: Diagnosis present

## 2014-05-31 DIAGNOSIS — G4733 Obstructive sleep apnea (adult) (pediatric): Secondary | ICD-10-CM

## 2014-05-31 DIAGNOSIS — L03116 Cellulitis of left lower limb: Secondary | ICD-10-CM

## 2014-05-31 DIAGNOSIS — D7582 Heparin induced thrombocytopenia (HIT): Secondary | ICD-10-CM

## 2014-05-31 DIAGNOSIS — D702 Other drug-induced agranulocytosis: Secondary | ICD-10-CM

## 2014-05-31 DIAGNOSIS — T368X5A Adverse effect of other systemic antibiotics, initial encounter: Secondary | ICD-10-CM | POA: Diagnosis present

## 2014-05-31 DIAGNOSIS — A419 Sepsis, unspecified organism: Secondary | ICD-10-CM | POA: Diagnosis not present

## 2014-05-31 DIAGNOSIS — L0390 Cellulitis, unspecified: Secondary | ICD-10-CM | POA: Diagnosis present

## 2014-05-31 DIAGNOSIS — L97909 Non-pressure chronic ulcer of unspecified part of unspecified lower leg with unspecified severity: Secondary | ICD-10-CM | POA: Diagnosis present

## 2014-05-31 DIAGNOSIS — D696 Thrombocytopenia, unspecified: Secondary | ICD-10-CM

## 2014-05-31 DIAGNOSIS — L02419 Cutaneous abscess of limb, unspecified: Secondary | ICD-10-CM | POA: Diagnosis not present

## 2014-05-31 DIAGNOSIS — D75829 Heparin-induced thrombocytopenia, unspecified: Secondary | ICD-10-CM

## 2014-05-31 DIAGNOSIS — L97929 Non-pressure chronic ulcer of unspecified part of left lower leg with unspecified severity: Secondary | ICD-10-CM

## 2014-05-31 DIAGNOSIS — L03119 Cellulitis of unspecified part of limb: Secondary | ICD-10-CM | POA: Diagnosis present

## 2014-05-31 DIAGNOSIS — T783XXA Angioneurotic edema, initial encounter: Secondary | ICD-10-CM | POA: Diagnosis not present

## 2014-05-31 HISTORY — DX: Cellulitis, unspecified: L03.90

## 2014-05-31 LAB — CBC WITH DIFFERENTIAL/PLATELET
Basophils Absolute: 0 10*3/uL (ref 0.0–0.1)
Basophils Relative: 0 % (ref 0–1)
Eosinophils Absolute: 0.1 10*3/uL (ref 0.0–0.7)
Eosinophils Relative: 1 % (ref 0–5)
HCT: 39.9 % (ref 36.0–46.0)
Hemoglobin: 13.2 g/dL (ref 12.0–15.0)
Lymphocytes Relative: 15 % (ref 12–46)
Lymphs Abs: 2 10*3/uL (ref 0.7–4.0)
MCH: 27.4 pg (ref 26.0–34.0)
MCHC: 33.1 g/dL (ref 30.0–36.0)
MCV: 83 fL (ref 78.0–100.0)
Monocytes Absolute: 0.8 10*3/uL (ref 0.1–1.0)
Monocytes Relative: 6 % (ref 3–12)
Neutro Abs: 10.6 10*3/uL — ABNORMAL HIGH (ref 1.7–7.7)
Neutrophils Relative %: 78 % — ABNORMAL HIGH (ref 43–77)
Platelets: 363 10*3/uL (ref 150–400)
RBC: 4.81 MIL/uL (ref 3.87–5.11)
RDW: 14.5 % (ref 11.5–15.5)
WBC: 13.5 10*3/uL — ABNORMAL HIGH (ref 4.0–10.5)

## 2014-05-31 LAB — COMPREHENSIVE METABOLIC PANEL
ALT: 15 U/L (ref 0–35)
AST: 15 U/L (ref 0–37)
Albumin: 3.5 g/dL (ref 3.5–5.2)
Alkaline Phosphatase: 81 U/L (ref 39–117)
Anion gap: 14 (ref 5–15)
BUN: 18 mg/dL (ref 6–23)
CO2: 22 mEq/L (ref 19–32)
Calcium: 9.3 mg/dL (ref 8.4–10.5)
Chloride: 98 mEq/L (ref 96–112)
Creatinine, Ser: 0.98 mg/dL (ref 0.50–1.10)
GFR calc Af Amer: 71 mL/min — ABNORMAL LOW (ref >90)
GFR calc non Af Amer: 61 mL/min — ABNORMAL LOW (ref >90)
Glucose, Bld: 111 mg/dL — ABNORMAL HIGH (ref 70–99)
Potassium: 3.8 mEq/L (ref 3.7–5.3)
Sodium: 134 mEq/L — ABNORMAL LOW (ref 137–147)
Total Bilirubin: 0.4 mg/dL (ref 0.3–1.2)
Total Protein: 8.2 g/dL (ref 6.0–8.3)

## 2014-05-31 LAB — I-STAT CG4 LACTIC ACID, ED
Lactic Acid, Venous: 0.58 mmol/L (ref 0.5–2.2)
Lactic Acid, Venous: 0.83 mmol/L (ref 0.5–2.2)

## 2014-05-31 MED ORDER — SODIUM CHLORIDE 0.9 % IV SOLN
1000.0000 mL | INTRAVENOUS | Status: DC
  Administered 2014-06-01 – 2014-06-03 (×6): 1000 mL via INTRAVENOUS

## 2014-05-31 MED ORDER — CLINDAMYCIN PHOSPHATE 600 MG/50ML IV SOLN
600.0000 mg | Freq: Once | INTRAVENOUS | Status: AC
  Administered 2014-06-01: 600 mg via INTRAVENOUS
  Filled 2014-05-31: qty 50

## 2014-05-31 MED ORDER — SODIUM CHLORIDE 0.9 % IV BOLUS (SEPSIS)
30.0000 mL/kg | Freq: Once | INTRAVENOUS | Status: AC
  Administered 2014-05-31: 2952 mL via INTRAVENOUS

## 2014-05-31 MED ORDER — SODIUM CHLORIDE 0.9 % IV SOLN
6.0000 mg/kg | Freq: Once | INTRAVENOUS | Status: AC
  Administered 2014-06-01: 590.5 mg via INTRAVENOUS
  Filled 2014-05-31: qty 11.81

## 2014-05-31 MED ORDER — ACETAMINOPHEN 325 MG PO TABS
650.0000 mg | ORAL_TABLET | Freq: Once | ORAL | Status: AC
  Administered 2014-05-31: 650 mg via ORAL
  Filled 2014-05-31: qty 2

## 2014-05-31 NOTE — ED Provider Notes (Signed)
CSN: [id number]     Arrival date & time 05/31/14  2005 History   First MD Initiated Contact with Patient 05/31/14 2233     Chief Complaint  Patient presents with  . Cellulitis     (Consider location/radiation/quality/duration/timing/severity/associated sxs/prior Treatment) HPI Comments: Patient presents to the emergency department with chief complaint of rash. She states that she noticed a few bumps on her left lower leg last week. She states that the rash is progressively worsened. It is draining purulent fluid. She reports associated fever and chills.  She has not taken anything to alleviate the symptoms.  She has applied Vaseline and calamine lotion and rubbing alcohol.  She has had this problem before on the right leg. It ultimately required debridement and skin grafting. She is nondiabetic.  The history is provided by the patient. No language interpreter was used.    Past Medical History  Diagnosis Date  . Hypertension   . Lower extremity ulceration   . Non-healing wound of lower extremity   . Cellulitis    Past Surgical History  Procedure Laterality Date  . Abdominal hysterectomy    . I&d extremity Right 08/23/2013    Procedure: I&D Right Leg Abscess;  Surgeon: Mcarthur Rossetti, MD;  Location: [place];  Service: Orthopedics;  Laterality: Right;  . Application of wound vac Right 08/23/2013    Procedure: APPLICATION OF WOUND VAC;  Surgeon: Mcarthur Rossetti, MD;  Location: [place];  Service: Orthopedics;  Laterality: Right;  . I&d extremity Right 08/27/2013    Procedure: REPEAT IRRIGATION AND DEBRIDEMENT OF RIGHT LEG WOUND;  Surgeon: Mcarthur Rossetti, MD;  Location: [facility];  Service: Orthopedics;  Laterality: Right;  . I&d extremity Right 08/30/2013    Procedure: Repeat IRRIGATION AND DEBRIDEMENT Right leg, skin graft;  Surgeon: Mcarthur Rossetti, MD;  Location: [place] [facility];  Service: Orthopedics;  Laterality: Right;   Family History  Problem Relation Age of Onset  .  CAD Mother   . Hypertension Mother    History  Substance Use Topics  . Smoking status: Never Smoker   . Smokeless tobacco: Never Used  . Alcohol Use: No   OB History   Grav Para Term Preterm Abortions TAB SAB Ect Mult Living                 Review of Systems  All other systems reviewed and are negative.     Allergies  Ancef; Heparin; and Vancomycin  Home Medications   Prior to Admission medications   Medication Sig Start Date End Date Taking? Authorizing Provider  amLODipine (NORVASC) 10 MG tablet Take 1 tablet (10 mg total) by mouth daily. 11/17/13   Truman Hayward, MD  ciprofloxacin (CIPRO) 500 MG tablet Take 1 tablet (500 mg total) by mouth 2 (two) times daily. 11/17/13   Truman Hayward, MD  doxycycline (VIBRA-TABS) 100 MG tablet Take 1 tablet (100 mg total) by mouth 2 (two) times daily. 11/17/13   Truman Hayward, MD  metroNIDAZOLE (FLAGYL) 500 MG tablet Take 1 tablet (500 mg total) by mouth 3 (three) times daily. 11/17/13   Truman Hayward, MD   BP 196/111  Pulse 109  Temp(Src) 102.4 F (39.1 C) (Oral)  Resp 14  Ht 5\' 3"  (1.6 m)  Wt 217 lb (98.431 kg)  BMI 38.45 kg/m2  SpO2 98% Physical Exam  Nursing note and vitals reviewed. Constitutional: She is oriented to person, place, and time. She appears well-developed and  well-nourished.  HENT:  Head: Normocephalic and atraumatic.  Eyes: Conjunctivae and EOM are normal. Pupils are equal, round, and reactive to light.  Neck: Normal range of motion. Neck supple.  Cardiovascular: Normal rate and regular rhythm.  Exam reveals no gallop and no friction rub.   No murmur heard. Pulmonary/Chest: Effort normal and breath sounds normal. No respiratory distress. She has no wheezes. She has no rales. She exhibits no tenderness.  Abdominal: Soft. She exhibits no distension and no mass. There is no tenderness. There is no rebound and no guarding.  Musculoskeletal: Normal range of motion. She exhibits no edema and no  tenderness.  Neurological: She is alert and oriented to person, place, and time.  Skin: Skin is warm and dry.  Purulent cellulitic lesion to the left lower extremity  Psychiatric: She has a normal mood and affect. Her behavior is normal. Judgment and thought content normal.    ED Course  Procedures (including critical care time) Results for orders placed during the hospital encounter of 05/31/14  CBC WITH DIFFERENTIAL      Result Value Ref Range   WBC 13.5 (*) 4.0 - 10.5 K/uL   RBC 4.81  3.87 - 5.11 MIL/uL   Hemoglobin 13.2  12.0 - 15.0 g/dL   HCT 39.9  36.0 - 46.0 %   MCV 83.0  78.0 - 100.0 fL   MCH 27.4  26.0 - 34.0 pg   MCHC 33.1  30.0 - 36.0 g/dL   RDW 14.5  11.5 - 15.5 %   Platelets 363  150 - 400 K/uL   Neutrophils Relative % 78 (*) 43 - 77 %   Neutro Abs 10.6 (*) 1.7 - 7.7 K/uL   Lymphocytes Relative 15  12 - 46 %   Lymphs Abs 2.0  0.7 - 4.0 K/uL   Monocytes Relative 6  3 - 12 %   Monocytes Absolute 0.8  0.1 - 1.0 K/uL   Eosinophils Relative 1  0 - 5 %   Eosinophils Absolute 0.1  0.0 - 0.7 K/uL   Basophils Relative 0  0 - 1 %   Basophils Absolute 0.0  0.0 - 0.1 K/uL  COMPREHENSIVE METABOLIC PANEL      Result Value Ref Range   Sodium 134 (*) 137 - 147 mEq/L   Potassium 3.8  3.7 - 5.3 mEq/L   Chloride 98  96 - 112 mEq/L   CO2 22  19 - 32 mEq/L   Glucose, Bld 111 (*) 70 - 99 mg/dL   BUN 18  6 - 23 mg/dL   Creatinine, Ser 0.98  0.50 - 1.10 mg/dL   Calcium 9.3  8.4 - 10.5 mg/dL   Total Protein 8.2  6.0 - 8.3 g/dL   Albumin 3.5  3.5 - 5.2 g/dL   AST 15  0 - 37 U/L   ALT 15  0 - 35 U/L   Alkaline Phosphatase 81  39 - 117 U/L   Total Bilirubin 0.4  0.3 - 1.2 mg/dL   GFR calc non Af Amer 61 (*) >90 mL/min   GFR calc Af Amer 71 (*) >90 mL/min   Anion gap 14  5 - 15  URINALYSIS, ROUTINE W REFLEX MICROSCOPIC      Result Value Ref Range   Color, Urine YELLOW  YELLOW   APPearance CLOUDY (*) CLEAR   Specific Gravity, Urine 1.007  1.005 - 1.030   pH 6.5  5.0 - 8.0    Glucose, UA NEGATIVE  NEGATIVE mg/dL  Hgb urine dipstick NEGATIVE  NEGATIVE   Bilirubin Urine NEGATIVE  NEGATIVE   Ketones, ur NEGATIVE  NEGATIVE mg/dL   Protein, ur NEGATIVE  NEGATIVE mg/dL   Urobilinogen, UA 0.2  0.0 - 1.0 mg/dL   Nitrite NEGATIVE  NEGATIVE   Leukocytes, UA NEGATIVE  NEGATIVE  I-STAT CG4 LACTIC ACID, ED      Result Value Ref Range   Lactic Acid, Venous 0.83  0.5 - 2.2 mmol/L  I-STAT CG4 LACTIC ACID, ED      Result Value Ref Range   Lactic Acid, Venous 0.58  0.5 - 2.2 mmol/L   Dg Tibia/fibula Left  06/01/2014   CLINICAL DATA:  Left lower extremity cellulitis, with drainage.  EXAM: LEFT TIBIA AND FIBULA - 2 VIEW  COMPARISON:  None.  FINDINGS: Soft tissue swelling is noted about the distal left lower leg, more prominent laterally.  There is no evidence of fracture or dislocation. There is no evidence of osseous erosion. The ankle joint is incompletely assessed, but appears grossly unremarkable.  The knee joint is unremarkable in appearance. An enthesophyte is seen arising at the upper pole of the patella.  IMPRESSION: No evidence of fracture or dislocation. No evidence of osseous erosion.   Electronically Signed   By: Garald Balding M.D.   On: 06/01/2014 00:33     Imaging Review No results found.   EKG Interpretation None      MDM   Final diagnoses:  Cellulitis of left leg    Patient with purulent cellulitic lesion to left lower extremity. She has had this problem before on the contralateral leg. It required debridement and skin grafting. She is allergic to vancomycin and Ancef. After consulting with pharmacy, will give the patient a dose of daptomycin and clindamycin. There is no fluid collection when assessed with ultrasound, I do not think there is anything to drain at this time.  Patient meets sepsis criteria.  Patient seen by and discussed with Dr. Sharol Given.  Will admit the patient to medicine.  12:34 AM Temp is now 99.4 checked by me.  Montine Circle,  PA-C 06/01/14 [phone]

## 2014-05-31 NOTE — ED Notes (Signed)
Pt monitored by pulse ox, bp cuff, and 5-lead. 

## 2014-05-31 NOTE — ED Notes (Signed)
Pt. reports progressing left lower leg skin infection with drainage onset last week , febrile and hypertensive at triage , pt. stated she is not taking any antihypertensive medications .

## 2014-05-31 NOTE — ED Notes (Signed)
Patient transported to X-ray 

## 2014-06-01 ENCOUNTER — Encounter (HOSPITAL_COMMUNITY): Payer: Self-pay | Admitting: General Practice

## 2014-06-01 DIAGNOSIS — L03119 Cellulitis of unspecified part of limb: Secondary | ICD-10-CM

## 2014-06-01 DIAGNOSIS — L02419 Cutaneous abscess of limb, unspecified: Secondary | ICD-10-CM | POA: Diagnosis present

## 2014-06-01 DIAGNOSIS — L97909 Non-pressure chronic ulcer of unspecified part of unspecified lower leg with unspecified severity: Secondary | ICD-10-CM | POA: Diagnosis present

## 2014-06-01 DIAGNOSIS — T783XXA Angioneurotic edema, initial encounter: Secondary | ICD-10-CM | POA: Diagnosis present

## 2014-06-01 DIAGNOSIS — I10 Essential (primary) hypertension: Secondary | ICD-10-CM | POA: Diagnosis present

## 2014-06-01 DIAGNOSIS — A419 Sepsis, unspecified organism: Secondary | ICD-10-CM | POA: Diagnosis present

## 2014-06-01 DIAGNOSIS — Y9210 Unspecified residential institution as the place of occurrence of the external cause: Secondary | ICD-10-CM | POA: Diagnosis present

## 2014-06-01 DIAGNOSIS — T368X5A Adverse effect of other systemic antibiotics, initial encounter: Secondary | ICD-10-CM | POA: Diagnosis present

## 2014-06-01 DIAGNOSIS — L97929 Non-pressure chronic ulcer of unspecified part of left lower leg with unspecified severity: Secondary | ICD-10-CM | POA: Diagnosis present

## 2014-06-01 LAB — CBC
HCT: 39.8 % (ref 36.0–46.0)
Hemoglobin: 12.9 g/dL (ref 12.0–15.0)
MCH: 27.6 pg (ref 26.0–34.0)
MCHC: 32.4 g/dL (ref 30.0–36.0)
MCV: 85.2 fL (ref 78.0–100.0)
Platelets: 338 10*3/uL (ref 150–400)
RBC: 4.67 MIL/uL (ref 3.87–5.11)
RDW: 14.6 % (ref 11.5–15.5)
WBC: 14.7 10*3/uL — ABNORMAL HIGH (ref 4.0–10.5)

## 2014-06-01 LAB — URINALYSIS, ROUTINE W REFLEX MICROSCOPIC
Bilirubin Urine: NEGATIVE
Glucose, UA: NEGATIVE mg/dL
Hgb urine dipstick: NEGATIVE
Ketones, ur: NEGATIVE mg/dL
Leukocytes, UA: NEGATIVE
Nitrite: NEGATIVE
Protein, ur: NEGATIVE mg/dL
Specific Gravity, Urine: 1.007 (ref 1.005–1.030)
Urobilinogen, UA: 0.2 mg/dL (ref 0.0–1.0)
pH: 6.5 (ref 5.0–8.0)

## 2014-06-01 LAB — BASIC METABOLIC PANEL
Anion gap: 15 (ref 5–15)
BUN: 12 mg/dL (ref 6–23)
CO2: 21 mEq/L (ref 19–32)
Calcium: 8.4 mg/dL (ref 8.4–10.5)
Chloride: 106 mEq/L (ref 96–112)
Creatinine, Ser: 0.79 mg/dL (ref 0.50–1.10)
GFR calc Af Amer: >90 mL/min (ref >90)
GFR calc non Af Amer: 88 mL/min — ABNORMAL LOW (ref >90)
Glucose, Bld: 128 mg/dL — ABNORMAL HIGH (ref 70–99)
Potassium: 3.5 mEq/L — ABNORMAL LOW (ref 3.7–5.3)
Sodium: 142 mEq/L (ref 137–147)

## 2014-06-01 MED ORDER — SODIUM CHLORIDE 0.9 % IV SOLN
500.0000 mg | Freq: Four times a day (QID) | INTRAVENOUS | Status: DC
  Administered 2014-06-01 (×2): 500 mg via INTRAVENOUS
  Filled 2014-06-01 (×3): qty 500

## 2014-06-01 MED ORDER — FONDAPARINUX SODIUM 2.5 MG/0.5ML ~~LOC~~ SOLN
2.5000 mg | Freq: Every day | SUBCUTANEOUS | Status: DC
  Administered 2014-06-01 – 2014-06-03 (×3): 2.5 mg via SUBCUTANEOUS
  Filled 2014-06-01 (×4): qty 0.5

## 2014-06-01 MED ORDER — ACETAMINOPHEN 325 MG PO TABS
650.0000 mg | ORAL_TABLET | Freq: Four times a day (QID) | ORAL | Status: DC | PRN
  Administered 2014-06-01 – 2014-06-02 (×3): 650 mg via ORAL
  Filled 2014-06-01 (×4): qty 2

## 2014-06-01 MED ORDER — SODIUM CHLORIDE 0.9 % IV SOLN
500.0000 mg | Freq: Three times a day (TID) | INTRAVENOUS | Status: DC
  Administered 2014-06-01 (×2): 500 mg via INTRAVENOUS
  Filled 2014-06-01 (×3): qty 500

## 2014-06-01 MED ORDER — AMLODIPINE BESYLATE 10 MG PO TABS
10.0000 mg | ORAL_TABLET | Freq: Every day | ORAL | Status: DC
  Administered 2014-06-01 – 2014-06-03 (×3): 10 mg via ORAL
  Filled 2014-06-01 (×3): qty 1

## 2014-06-01 NOTE — H&P (Signed)
[facility] History and Physical  Lindsay [name] XIP:[id number] DOB: May 05, 1951 DOA: 05/31/2014  Referring physician: EDP PCP: No PCP Per Patient   Chief Complaint: LLE ulcer   HPI: Lindsay [name] is a 63 y.o. female with h/o RLE ulcer on her shin of undetermined underlying cause earlier this year which finally healed up after skin graft and antibiotics to treat the pseudomonas and MSSA infections, presents to the ED with new LLE skin changes, ulcer, and drainage which is eerily similar.  Symptoms onset last week, patient was febrile at triage.  Despite the location the patient has had BLE venous ultrasounds which demonstrated no venous insufficiency to give a clue as to the underlying cause of the ulcer (arterial US also unremarkable).  Review of Systems: Systems reviewed.  As above, otherwise negative  Past Medical History  Diagnosis Date  . Hypertension   . Lower extremity ulceration   . Non-healing wound of lower extremity   . Cellulitis    Past Surgical History  Procedure Laterality Date  . Abdominal hysterectomy    . I&d extremity Right 08/23/2013    Procedure: I&D Right Leg Abscess;  Surgeon: Mcarthur Rossetti, MD;  Location: [facility];  Service: Orthopedics;  Laterality: Right;  . Application of wound vac Right 08/23/2013    Procedure: APPLICATION OF WOUND VAC;  Surgeon: Mcarthur Rossetti, MD;  Location: [facility];  Service: Orthopedics;  Laterality: Right;  . I&d extremity Right 08/27/2013    Procedure: REPEAT IRRIGATION AND DEBRIDEMENT OF RIGHT LEG WOUND;  Surgeon: Mcarthur Rossetti, MD;  Location: [facility];  Service: Orthopedics;  Laterality: Right;  . I&d extremity Right 08/30/2013    Procedure: Repeat IRRIGATION AND DEBRIDEMENT Right leg, skin graft;  Surgeon: Mcarthur Rossetti, MD;  Location: [facility];  Service: Orthopedics;  Laterality: Right;   Social History:  reports that she has never smoked. She has never used smokeless tobacco. She reports that she does  not drink alcohol or use illicit drugs.  Allergies  Allergen Reactions  . Ancef [Cefazolin] Rash  . Heparin     Possible HIT  . Vancomycin Rash    Family History  Problem Relation Age of Onset  . CAD Mother   . Hypertension Mother      Prior to Admission medications   Medication Sig Start Date End Date Taking? Authorizing Provider  Multiple Vitamins-Minerals (CENTRUM ADULTS PO) Take 1 tablet by mouth daily.   Yes Historical Provider, MD   Physical Exam: Filed Vitals:   06/01/14 0059  BP: 164/85  Pulse: 97  Temp: 98.2 F (36.8 C)  Resp: 20    BP 164/85  Pulse 97  Temp(Src) 98.2 F (36.8 C) (Oral)  Resp 20  Ht 5\' 3"  (1.6 m)  Wt 98.431 kg (217 lb)  BMI 38.45 kg/m2  SpO2 98%  General Appearance:    Alert, oriented, no distress, appears stated age  Head:    Normocephalic, atraumatic  Eyes:    PERRL, EOMI, sclera non-icteric        Nose:   Nares without drainage or epistaxis. Mucosa, turbinates normal  Throat:   Moist mucous membranes. Oropharynx without erythema or exudate.  Neck:   Supple. No carotid bruits.  No thyromegaly.  No lymphadenopathy.   Back:     No CVA tenderness, no spinal tenderness  Lungs:     Clear to auscultation bilaterally, without wheezes, rhonchi or rales  Chest wall:    No tenderness to palpitation  Heart:  Regular rate and rhythm without murmurs, gallops, rubs  Abdomen:     Soft, non-tender, nondistended, normal bowel sounds, no organomegaly  Genitalia:    deferred  Rectal:    deferred  Extremities:   Scar on RLE from prior ulcer, LLE with active ulcer draining a florescent yellow/greenish fluid which is suspicious of pseudomonas  Pulses:   2+ and symmetric all extremities  Skin:   Skin color, texture, turgor normal, no rashes or lesions  Lymph nodes:   Cervical, supraclavicular, and axillary nodes normal  Neurologic:   CNII-XII intact. Normal strength, sensation and reflexes      throughout    Labs on Admission:  Basic Metabolic  Panel:  Recent Labs Lab 05/31/14 2055  NA 134*  K 3.8  CL 98  CO2 22  GLUCOSE 111*  BUN 18  CREATININE 0.98  CALCIUM 9.3   Liver Function Tests:  Recent Labs Lab 05/31/14 2055  AST 15  ALT 15  ALKPHOS 81  BILITOT 0.4  PROT 8.2  ALBUMIN 3.5   No results found for this basename: LIPASE, AMYLASE,  in the last 168 hours No results found for this basename: AMMONIA,  in the last 168 hours CBC:  Recent Labs Lab 05/31/14 2055  WBC 13.5*  NEUTROABS 10.6*  HGB 13.2  HCT 39.9  MCV 83.0  PLT 363   Cardiac Enzymes: No results found for this basename: CKTOTAL, CKMB, CKMBINDEX, TROPONINI,  in the last 168 hours  BNP (last 3 results) No results found for this basename: PROBNP,  in the last 8760 hours CBG: No results found for this basename: GLUCAP,  in the last 168 hours  Radiological Exams on Admission: Dg Tibia/fibula Left  06/01/2014   CLINICAL DATA:  Left lower extremity cellulitis, with drainage.  EXAM: LEFT TIBIA AND FIBULA - 2 VIEW  COMPARISON:  None.  FINDINGS: Soft tissue swelling is noted about the distal left lower leg, more prominent laterally.  There is no evidence of fracture or dislocation. There is no evidence of osseous erosion. The ankle joint is incompletely assessed, but appears grossly unremarkable.  The knee joint is unremarkable in appearance. An enthesophyte is seen arising at the upper pole of the patella.  IMPRESSION: No evidence of fracture or dislocation. No evidence of osseous erosion.   Electronically Signed   By: Garald Balding M.D.   On: 06/01/2014 00:33    EKG: Independently reviewed.  Assessment/Plan Principal Problem:   Cellulitis Active Problems:   Ulcer of left lower leg   Sepsis   1. Cellulitis and ulcer of LLE - given the history of RLE ulcer, got initial ABx with clindamycin and daptomycin in ED.  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  Ulcer on RLE had MSSA  and pseudomonas infection, the LLE ulcer has a yellow-green discharge which is suspicious for pseudomonas.  Given her history feel that it is reasonable to add pseudomonas coverage empirically until blood and wound cultures come back and she has a chance to be evaluated.  Will start imipenem (which incidentally is the same medication that ultimately made her better last time).  Wound care consult ordered. 2. Sepsis - IVF and ABX as above, tylenol for fever 3. Recurrent ulcers on BLE - unclear as to the underlying cause of her ulceration, perhaps dermatology evaluation would be useful?  Likely would benefit from an ID consult in AM as they were the ones following her treatment  last time on an outpatient and inpatient basis.  Code Status: Full Code  Family Communication: Family at bedside Disposition Plan: Admit to inpatient   Time spent: 70 min  Shwanda Soltis M. [facility] Pager [phone]  If 7AM-7PM, please contact the day team taking care of the patient [email or web address] Password [email or web address] 06/01/2014, 1:06 AM

## 2014-06-01 NOTE — Progress Notes (Signed)
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  Not good candidate for mechanical VTE Px given cellulitis, argatroban initially ordered; discussed w/ admitting MD Jennette Kettle who agrees that Arixtra 2.5mg  daily is appropriate tx and easier for pt and monitoring.  Wt 98kg, CrCl 65 ml/min.  Wynona Neat, PharmD, BCPS 06/01/2014 1:39 AM

## 2014-06-01 NOTE — ED Notes (Signed)
PA at bedside; U/S performed on LL wound in room by PA

## 2014-06-01 NOTE — ED Provider Notes (Signed)
Medical screening examination/treatment/procedure(s) were conducted as a shared visit with non-physician practitioner(s) and myself.  I personally evaluated the patient during the encounter.   EKG Interpretation None     Pt with rash, fever, h/o same in other leg.  Cellulitis with purulence, but no signs of abscess.  Hospitalist to admit.  Kalman Drape, MD 06/01/14 [phone]

## 2014-06-01 NOTE — ED Notes (Signed)
Admitting Md at bedside

## 2014-06-01 NOTE — Consult Note (Addendum)
WOC wound consult note Reason for Consult: Consult requested for left leg wound.  Pt states she previously had a wound like this that healed to the right leg after follow-up with the outpatient wound care center and a skin graft.  A new wound has occurred to the left leg this week. Wound type: Full thickness; very unusual in appearance, not consistent with a stasis ulcer. Measurement: Affected area to left outer calf 8X8cm which is raised above skin level.  Multiple patchy areas of full thickness open wounds with large amt thick tan pus draining, no odor, very painful to touch with swab. Approx  .3 cm in depth, areas too shallow to pack. Periwound: Intact skin surrounding Dressing procedure/placement/frequency: Pt does not have an abscess that needs to be drained according to MRI.  Do not think she could tolerated hydrotherapy R/T pain. Previous gauze dressing very painful upon removal.  Applied Mepitel contact layer to decrease discomfort and protect wound bed.  Applied Silver hydrogel to provide antimicrobial benefits and promote moist healing.  This product is not available in the [place] and samples left at bedside for bedside nurse to use with daily dressing changes. This can be covered with moist gauze to facilitate drainage of pus. Pt very knowledgeable regarding topical treatment and verbalizes understanding when plan of care was discussed. Pt could benefit from a wound biopsy to determine etiology of wounds; this would require a dermatology consult after discharge. Recommend home health for dressing change assistance and follow-up at the outpatient wound care center after discharge for serial debridement.   Please re-consult if further assistance is needed.  Thank-you,  Julien Girt MSN, [place], [place], [place], [place]

## 2014-06-02 DIAGNOSIS — T783XXA Angioneurotic edema, initial encounter: Secondary | ICD-10-CM | POA: Diagnosis not present

## 2014-06-02 LAB — CBC
HCT: 38.1 % (ref 36.0–46.0)
Hemoglobin: 12.3 g/dL (ref 12.0–15.0)
MCH: 27.5 pg (ref 26.0–34.0)
MCHC: 32.3 g/dL (ref 30.0–36.0)
MCV: 85.2 fL (ref 78.0–100.0)
Platelets: 369 10*3/uL (ref 150–400)
RBC: 4.47 MIL/uL (ref 3.87–5.11)
RDW: 14.6 % (ref 11.5–15.5)
WBC: 11.6 10*3/uL — ABNORMAL HIGH (ref 4.0–10.5)

## 2014-06-02 LAB — URINE CULTURE
Colony Count: NO GROWTH
Culture: NO GROWTH

## 2014-06-02 MED ORDER — DIPHENHYDRAMINE HCL 25 MG PO CAPS
25.0000 mg | ORAL_CAPSULE | Freq: Four times a day (QID) | ORAL | Status: AC
  Administered 2014-06-02 (×4): 25 mg via ORAL
  Filled 2014-06-02 (×4): qty 1

## 2014-06-02 MED ORDER — FAMOTIDINE 20 MG PO TABS
20.0000 mg | ORAL_TABLET | Freq: Two times a day (BID) | ORAL | Status: AC
  Administered 2014-06-03: 20 mg via ORAL
  Filled 2014-06-02: qty 1

## 2014-06-02 MED ORDER — DOXYCYCLINE HYCLATE 100 MG IV SOLR
100.0000 mg | Freq: Two times a day (BID) | INTRAVENOUS | Status: DC
  Administered 2014-06-02: 100 mg via INTRAVENOUS
  Filled 2014-06-02 (×3): qty 100

## 2014-06-02 MED ORDER — CIPROFLOXACIN HCL 500 MG PO TABS
500.0000 mg | ORAL_TABLET | Freq: Two times a day (BID) | ORAL | Status: DC
  Administered 2014-06-02 – 2014-06-03 (×3): 500 mg via ORAL
  Filled 2014-06-02 (×7): qty 1

## 2014-06-02 MED ORDER — PREDNISONE 20 MG PO TABS
40.0000 mg | ORAL_TABLET | Freq: Two times a day (BID) | ORAL | Status: DC
  Administered 2014-06-02 – 2014-06-03 (×3): 40 mg via ORAL
  Filled 2014-06-02 (×4): qty 2

## 2014-06-02 MED ORDER — CIPROFLOXACIN IN D5W 400 MG/200ML IV SOLN
400.0000 mg | Freq: Two times a day (BID) | INTRAVENOUS | Status: DC
  Administered 2014-06-02 (×2): 400 mg via INTRAVENOUS
  Filled 2014-06-02 (×3): qty 200

## 2014-06-02 MED ORDER — FAMOTIDINE 10 MG PO TABS
10.0000 mg | ORAL_TABLET | Freq: Two times a day (BID) | ORAL | Status: DC
  Administered 2014-06-02 (×2): 10 mg via ORAL
  Filled 2014-06-02 (×3): qty 1

## 2014-06-02 MED ORDER — DOXYCYCLINE HYCLATE 100 MG PO TABS
100.0000 mg | ORAL_TABLET | Freq: Two times a day (BID) | ORAL | Status: DC
  Administered 2014-06-02 – 2014-06-03 (×3): 100 mg via ORAL
  Filled 2014-06-02 (×5): qty 1

## 2014-06-02 NOTE — Progress Notes (Signed)
CARE MANAGEMENT NOTE 06/02/2014  Patient:  Lindsay [name],Lindsay [name]   Account Number:  [id number]  Date Initiated:  06/02/2014  Documentation initiated by:  Lizabeth Leyden  Subjective/Objective Assessment:   admitted with cellulitis LLE  lives at home with granddaughter     Action/Plan:   progression of care and discharge planning   Anticipated DC Date:  06/03/2014   Anticipated DC Plan:  [place]  CM consult      Choice offered to / List presented to:             Status of service:  In process, will continue to follow Medicare Important Message given?   (If response is "NO", the following Medicare IM given date fields will be blank) Date Medicare IM given:   Medicare IM given by:   Date Additional Medicare IM given:   Additional Medicare IM given by:    Discharge Disposition:    Per UR Regulation:    If discussed at Long Length of Stay Meetings, dates discussed:    Comments:   06/02/2014  [place], CCM [phone] advised patient no earlier appointments at wound center, she is scheduled 06/22/2014 @ 10:15 am  06/02/2014  [place], [facility]  [phone] called for f/u appointment.  Patient is scheduled for 06/22/2014 @ 10:15 am, no earlier appointments,  06/02/2014  [place], [place] met with patient to discuss medical follow up post discharge. She does not have a PCP, verified she has [employer]. Suggested she contact [employer] for a list of PCP's. Patient states she is scheduled at the wound clinic 06/22/2014.   06/02/2014  [place], [place] [phone]  CM referral PCP and appointment at wound clinic

## 2014-06-02 NOTE — Progress Notes (Signed)
[facility] PROGRESS NOTE  Lindsay [name] JSE:[id number] DOB: 24-Mar-1951 DOA: 05/31/2014 PCP: No PCP Per Patient  Assessment/Plan:  Principal Problem:   Cellulitis with ulcer: left leg. Improving, but had lip swelling with primaxin. Has tolerated oral doxy and cipro in the past. Culture pending, but had pseudomonas and MSSA infection other leg. Still with lip swelling. Continue benadryl.  Change pepcid to 20 bid. Add prednisone. Needs derm follow up as outpatient Active Problems:   Allergic angioedema   Code Status:  full Family Communication:   Disposition Plan:  home  HPI/Subjective: Lip swelling about the same. Noticed chin swelling as well. No tongue swelling or dyspnea or wheezing.  Objective: Filed Vitals:   06/02/14 1000  BP: 150/83  Pulse: 99  Temp: 98 F (36.7 C)  Resp: 20    Intake/Output Summary (Last 24 hours) at 06/02/14 1059 Last data filed at 06/02/14 0900  Gross per 24 hour  Intake   1200 ml  Output      0 ml  Net   1200 ml   Filed Weights   05/31/14 2023 06/01/14 0157 06/01/14 2116  Weight: 98.431 kg (217 lb) 99.111 kg (218 lb 8 oz) 100.245 kg (221 lb)    Exam:   General:  Comfortable.   Heent: lip and chin swollen. No tongue swelling. Airway ok  Cardiovascular: RRR without MGR  Respiratory: CTA without WRR  Abdomen: S, NT, ND  Ext: left leg with minimal erythema. Swelling down. Ulcer with serosanguinous drainage. No odor  Basic Metabolic Panel:  Recent Labs Lab 05/31/14 2055 06/01/14 0518  NA 134* 142  K 3.8 3.5*  CL 98 106  CO2 22 21  GLUCOSE 111* 128*  BUN 18 12  CREATININE 0.98 0.79  CALCIUM 9.3 8.4   Liver Function Tests:  Recent Labs Lab 05/31/14 2055  AST 15  ALT 15  ALKPHOS 81  BILITOT 0.4  PROT 8.2  ALBUMIN 3.5   No results found for this basename: LIPASE, AMYLASE,  in the last 168 hours No results found for this basename: AMMONIA,  in the last 168 hours CBC:  Recent Labs Lab 05/31/14 2055  06/01/14 0518 06/02/14 0323  WBC 13.5* 14.7* 11.6*  NEUTROABS 10.6*  --   --   HGB 13.2 12.9 12.3  HCT 39.9 39.8 38.1  MCV 83.0 85.2 85.2  PLT 363 338 369   Cardiac Enzymes: No results found for this basename: CKTOTAL, CKMB, CKMBINDEX, TROPONINI,  in the last 168 hours BNP (last 3 results) No results found for this basename: PROBNP,  in the last 8760 hours CBG: No results found for this basename: GLUCAP,  in the last 168 hours  Recent Results (from the past 240 hour(s))  CULTURE, BLOOD (ROUTINE X 2)     Status: None   Collection Time    05/31/14 11:15 PM      Result Value Ref Range Status   Specimen Description BLOOD ARM LEFT   Final   Special Requests BOTTLES DRAWN AEROBIC AND ANAEROBIC 10CC   Final   Culture  Setup Time     Final   Value: 06/01/2014 04:20     Performed at [facility]   Culture     Final   Value:        BLOOD CULTURE RECEIVED NO GROWTH TO DATE CULTURE WILL BE HELD FOR 5 DAYS BEFORE ISSUING A FINAL NEGATIVE REPORT     Performed at [facility]   Report Status PENDING  Incomplete  CULTURE, BLOOD (ROUTINE X 2)     Status: None   Collection Time    05/31/14 11:25 PM      Result Value Ref Range Status   Specimen Description BLOOD FOREARM RIGHT   Final   Special Requests BOTTLES DRAWN AEROBIC AND ANAEROBIC 10CC   Final   Culture  Setup Time     Final   Value: 06/01/2014 04:17     Performed at [facility]   Culture     Final   Value:        BLOOD CULTURE RECEIVED NO GROWTH TO DATE CULTURE WILL BE HELD FOR 5 DAYS BEFORE ISSUING A FINAL NEGATIVE REPORT     Performed at [facility]   Report Status PENDING   Incomplete  URINE CULTURE     Status: None   Collection Time    06/01/14 12:06 AM      Result Value Ref Range Status   Specimen Description URINE, CLEAN CATCH   Final   Special Requests NONE   Final   Culture  Setup Time     Final   Value: 06/01/2014 09:16     Performed at [facility]     Final    Value: NO GROWTH     Performed at [facility]   Culture     Final   Value: NO GROWTH     Performed at [facility]   Report Status 06/02/2014 FINAL   Final  WOUND CULTURE     Status: None   Collection Time    06/01/14  1:03 AM      Result Value Ref Range Status   Specimen Description LEG   Final   Special Requests Immunocompromised   Final   Gram Stain     Final   Value: FEW WBC PRESENT,BOTH PMN AND MONONUCLEAR     NO SQUAMOUS EPITHELIAL CELLS SEEN     NO ORGANISMS SEEN     Performed at [facility]     Final   Value: Culture reincubated for better growth     Performed at [facility]   Report Status PENDING   Incomplete     Studies: Dg Tibia/fibula Left  06/01/2014   CLINICAL DATA:  Left lower extremity cellulitis, with drainage.  EXAM: LEFT TIBIA AND FIBULA - 2 VIEW  COMPARISON:  None.  FINDINGS: Soft tissue swelling is noted about the distal left lower leg, more prominent laterally.  There is no evidence of fracture or dislocation. There is no evidence of osseous erosion. The ankle joint is incompletely assessed, but appears grossly unremarkable.  The knee joint is unremarkable in appearance. An enthesophyte is seen arising at the upper pole of the patella.  IMPRESSION: No evidence of fracture or dislocation. No evidence of osseous erosion.   Electronically Signed   By: Garald Balding M.D.   On: 06/01/2014 00:33    Scheduled Meds: . amLODipine  10 mg Oral Daily  . ciprofloxacin  500 mg Oral BID  . diphenhydrAMINE  25 mg Oral Q6H  . doxycycline  100 mg Oral Q12H  . famotidine  20 mg Oral BID  . fondaparinux (ARIXTRA) injection  2.5 mg Subcutaneous Q0600  . predniSONE  40 mg Oral BID   Continuous Infusions: . sodium chloride 1,000 mL (06/02/14 0904)    Time spent: 35 minutes  [facility] Pager [phone]. If 7PM-7AM,  please contact night-coverage at [email or web address], password [facility] 06/02/2014, 10:59 AM   LOS: 2 days

## 2014-06-02 NOTE — Progress Notes (Signed)
Pt called me to room to assess swelling noted to her upper lip. No tongue swelling noted. Denies itchiness. Page to T. Rogue Bussing for notification. Awaiting call back. Dorthey Sawyer, RN

## 2014-06-02 NOTE — Progress Notes (Signed)
Chart reviewed. Patient examined admitted after midnight  Lindsay Barthel, MD

## 2014-06-02 NOTE — Progress Notes (Signed)
Wound clinic: patient had scheduled appointment 06/22/2014 @ 10:15 am.  Per wound clinic no appointments available prior to 06/22/2014

## 2014-06-02 NOTE — Progress Notes (Signed)
[facility] progress note. Chief complaint. Facial swelling. History of present illness. This 63 year old female admitted with cellulitis of the left lower remedy was initiated on Primaxin antibiotic therapy. The patient had been hospitalized previously with Primaxin antibiotic utilized at that time. Nursing contacted me and indicated that the patient had received only Primaxin and Tylenol and now has developed rapidly progressive upper lip swelling. I came to the bedside to evaluate the patient. Patient denies tongue swelling or difficulty breathing. Vital signs: Temperature 98.3, pulse 13, respiration 20, blood pressure 159/79. O2 sats 90%. General appearance. Well-developed female who is alert and in no distress. HEENT. There is swelling of the patient's upper lip to about twice its normal size. No other facial swelling is visible. No swelling of the tongue or uvula visualized. Cardiac. Rate and rhythm regular. Lungs. Breath sounds clear and equal. Impression/plan. Problem #1. Drug allergy. Despite the fact the patient has tolerated Primaxin previously I see no other medication that could have caused this. Discussed with pharmacy and they agree with discontinuance of the medication Primaxin and listing of it as an allergy. Will substitute antibiotic therapy using doxycycline 100 mg twice daily and Cipro 400 mg twice daily which the patient has previously tolerated. She is not a good vancomycin candidate because of file allergy to this as well. Nursing will monitor closely overnight for any oral swelling. I will place on Benadryl and Pepcid for 24 hours to help with allergic reaction.

## 2014-06-03 DIAGNOSIS — I10 Essential (primary) hypertension: Secondary | ICD-10-CM

## 2014-06-03 LAB — WOUND CULTURE

## 2014-06-03 LAB — GLUCOSE, CAPILLARY
Glucose-Capillary: 186 mg/dL — ABNORMAL HIGH (ref 70–99)
Glucose-Capillary: 129 mg/dL — ABNORMAL HIGH (ref 70–99)

## 2014-06-03 MED ORDER — ACETAMINOPHEN 325 MG PO TABS: 650.0000 mg | ORAL_TABLET | Freq: Four times a day (QID) | ORAL | Status: DC | PRN

## 2014-06-03 MED ORDER — DOXYCYCLINE HYCLATE 100 MG PO TABS: 100.0000 mg | ORAL_TABLET | Freq: Two times a day (BID) | ORAL | Status: DC

## 2014-06-03 MED ORDER — AMLODIPINE BESYLATE 10 MG PO TABS: 10.0000 mg | ORAL_TABLET | Freq: Every day | ORAL | Status: DC

## 2014-06-03 MED ORDER — CIPROFLOXACIN HCL 500 MG PO TABS: 500.0000 mg | ORAL_TABLET | Freq: Two times a day (BID) | ORAL | Status: DC

## 2014-06-03 MED ORDER — HYDROCODONE-ACETAMINOPHEN 5-325 MG PO TABS: 1.0000 | ORAL_TABLET | Freq: Four times a day (QID) | ORAL | Status: DC | PRN

## 2014-06-03 NOTE — Discharge Summary (Signed)
Physician Discharge Summary  Lindsay [name] XVQ:[id number] DOB: [birth year] DOA: 05/31/2014  PCP: No PCP Per Patient  Admit date: 05/31/2014 Discharge date: 06/03/2014  Time spent: greater than 30 minutes  Recommendations for Outpatient Follow-up:   Discharge Diagnoses:  Principal Problem:   Cellulitis Active Problems:   Ulcer of left lower leg   Sepsis   Allergic angioedema HTN  Discharge Condition: stable  Filed Weights   06/01/14 0157 06/01/14 2116 06/02/14 2045  Weight: 99.111 kg (218 lb 8 oz) 100.245 kg (221 lb) 100.699 kg (222 lb)    History of present illness:  63 y.o. female with h/o RLE ulcer on her shin of undetermined underlying cause earlier this year which finally healed up after skin graft and antibiotics to treat the pseudomonas and MSSA infections, presents to the ED with new LLE skin changes, ulcer, and drainage which is similar. Symptoms onset last week, patient was febrile at triage. Despite the location the patient has had BLE venous ultrasounds which demonstrated no venous insufficiency (arterial US also unremarkable).  Hospital Course:  Started on primaxin, which was used for previous infection. Developed allergic angioedema. Given benadryl, prednisone, H2 blocker. Transitioned to cipro and doxycycline. Tolerated new regimen well. By discharge, afebrile, cellulitis resolved. Will follow up with wound clinic. Also recommend dermatology follow up for biopsy to r/o vasculitis, as this is the second ulcer she has developed on opposite legs. Also needs to establish PCP  Procedures: None  Consultations:  Wound care  Discharge Exam: Filed Vitals:   06/03/14 1027  BP: 156/81  Pulse: 101  Temp: 98.2 F (36.8 C)  Resp: 19    General: nontoxic HEENT: no lip or tongue swelling Cardiovascular: RRR without MGR Respiratory: CTA without WRR    Discharge Instructions   Diet - low sodium heart healthy    Complete by:  As directed      Discharge instructions     Complete by:  As directed   Keep leg elevated. Change dressing daily or as needed when saturated          Current Discharge Medication List    START taking these medications   Details  acetaminophen (TYLENOL) 325 MG tablet Take 2 tablets (650 mg total) by mouth every 6 (six) hours as needed for mild pain or fever.    ciprofloxacin (CIPRO) 500 MG tablet Take 1 tablet (500 mg total) by mouth 2 (two) times daily. Qty: 14 tablet, Refills: 0    doxycycline (VIBRA-TABS) 100 MG tablet Take 1 tablet (100 mg total) by mouth every 12 (twelve) hours. Qty: 14 tablet, Refills: 0    HYDROcodone-acetaminophen (NORCO/VICODIN) 5-325 MG per tablet Take 1 tablet by mouth every 6 (six) hours as needed. Qty: 20 tablet, Refills: 0      CONTINUE these medications which have CHANGED   Details  amLODipine (NORVASC) 10 MG tablet Take 1 tablet (10 mg total) by mouth daily. Qty: 30 tablet, Refills: 0   Associated Diagnoses: HTN (hypertension)      CONTINUE these medications which have NOT CHANGED   Details  Multiple Vitamins-Minerals (CENTRUM ADULTS PO) Take 1 tablet by mouth daily.       Allergies  Allergen Reactions  . Ancef [Cefazolin] Rash  . Primaxin [Imipenem] Swelling    Developed lip swelling post administration of Primaxin even though she had tolerated this in the past.  . Heparin     Possible HIT  . Vancomycin Rash   Follow-up Information   Follow up with [facility]              On 06/22/2014. (appt 06/22/2014 at 10:15 am)    Contact information:   [address] [address] [phone]      Follow up with primary care provider in your network. (to establish primary care provider)       Schedule an appointment as soon as possible for a visit with [facility]. (for ulcer on leg [phone])        The results of significant diagnostics from this hospitalization (including imaging, microbiology,  ancillary and laboratory) are listed below for reference.    Significant Diagnostic Studies: Dg Tibia/fibula Left  06/01/2014   CLINICAL DATA:  Left lower extremity cellulitis, with drainage.  EXAM: LEFT TIBIA AND FIBULA - 2 VIEW  COMPARISON:  None.  FINDINGS: Soft tissue swelling is noted about the distal left lower leg, more prominent laterally.  There is no evidence of fracture or dislocation. There is no evidence of osseous erosion. The ankle joint is incompletely assessed, but appears grossly unremarkable.  The knee joint is unremarkable in appearance. An enthesophyte is seen arising at the upper pole of the patella.  IMPRESSION: No evidence of fracture or dislocation. No evidence of osseous erosion.   Electronically Signed   By: Garald Balding M.D.   On: 06/01/2014 00:33    Microbiology: Recent Results (from the past 240 hour(s))  CULTURE, BLOOD (ROUTINE X 2)     Status: None   Collection Time    05/31/14 11:15 PM      Result Value Ref Range Status   Specimen Description BLOOD ARM LEFT   Final   Special Requests BOTTLES DRAWN AEROBIC AND ANAEROBIC 10CC   Final   Culture  Setup Time     Final   Value: 06/01/2014 04:20     Performed at [facility]   Culture     Final   Value:        BLOOD CULTURE RECEIVED NO GROWTH TO DATE CULTURE WILL BE HELD FOR 5 DAYS BEFORE ISSUING A FINAL NEGATIVE REPORT     Performed at [facility]   Report Status PENDING   Incomplete  CULTURE, BLOOD (ROUTINE X 2)     Status: None   Collection Time    05/31/14 11:25 PM      Result Value Ref Range Status   Specimen Description BLOOD FOREARM RIGHT   Final   Special Requests BOTTLES DRAWN AEROBIC AND ANAEROBIC 10CC   Final   Culture  Setup Time     Final   Value: 06/01/2014 04:17     Performed at [facility]   Culture     Final   Value:        BLOOD CULTURE RECEIVED NO GROWTH TO DATE CULTURE WILL BE HELD FOR 5 DAYS BEFORE ISSUING A FINAL NEGATIVE REPORT     Performed at [facility]   Report Status PENDING   Incomplete  URINE CULTURE     Status: None   Collection Time    06/01/14 12:06 AM      Result Value Ref Range Status   Specimen Description URINE, CLEAN CATCH   Final   Special Requests NONE   Final   Culture  Setup Time     Final   Value: 06/01/2014 09:16     Performed at [facility]  Final   Value: NO GROWTH     Performed at [facility]   Culture     Final   Value: NO GROWTH     Performed at [facility]   Report Status 06/02/2014 FINAL   Final  WOUND CULTURE     Status: None   Collection Time    06/01/14  1:03 AM      Result Value Ref Range Status   Specimen Description LEG   Final   Special Requests Immunocompromised   Final   Gram Stain     Final   Value: FEW WBC PRESENT,BOTH PMN AND MONONUCLEAR     NO SQUAMOUS EPITHELIAL CELLS SEEN     NO ORGANISMS SEEN     Performed at [facility]   Culture     Final   Value: FEW STAPHYLOCOCCUS SPECIES (COAGULASE NEGATIVE)     Performed at [facility]   Report Status 06/03/2014 FINAL   Final     Labs: Basic Metabolic Panel:  Recent Labs Lab 05/31/14 2055 06/01/14 0518  NA 134* 142  K 3.8 3.5*  CL 98 106  CO2 22 21  GLUCOSE 111* 128*  BUN 18 12  CREATININE 0.98 0.79  CALCIUM 9.3 8.4   Liver Function Tests:  Recent Labs Lab 05/31/14 2055  AST 15  ALT 15  ALKPHOS 81  BILITOT 0.4  PROT 8.2  ALBUMIN 3.5   No results found for this basename: LIPASE, AMYLASE,  in the last 168 hours No results found for this basename: AMMONIA,  in the last 168 hours CBC:  Recent Labs Lab 05/31/14 2055 06/01/14 0518 06/02/14 0323  WBC 13.5* 14.7* 11.6*  NEUTROABS 10.6*  --   --   HGB 13.2 12.9 12.3  HCT 39.9 39.8 38.1  MCV 83.0 85.2 85.2  PLT 363 338 369   Cardiac Enzymes: No results found for this basename: CKTOTAL, CKMB, CKMBINDEX, TROPONINI,  in the last 168 hours BNP: BNP (last 3 results) No results found for this  basename: PROBNP,  in the last 8760 hours CBG:  Recent Labs Lab 06/03/14 0918 06/03/14 1155  GLUCAP 186* 129*    Signed:  [place] L  [facility] 06/03/2014, 12:37 PM

## 2014-06-07 LAB — CULTURE, BLOOD (ROUTINE X 2)
Culture: NO GROWTH
Culture: NO GROWTH

## 2014-06-22 ENCOUNTER — Encounter (HOSPITAL_BASED_OUTPATIENT_CLINIC_OR_DEPARTMENT_OTHER): Payer: 59 | Attending: Internal Medicine

## 2014-06-22 ENCOUNTER — Other Ambulatory Visit: Payer: Self-pay | Admitting: Internal Medicine

## 2014-06-22 DIAGNOSIS — L03116 Cellulitis of left lower limb: Secondary | ICD-10-CM | POA: Insufficient documentation

## 2014-06-22 DIAGNOSIS — I87313 Chronic venous hypertension (idiopathic) with ulcer of bilateral lower extremity: Secondary | ICD-10-CM | POA: Diagnosis not present

## 2014-06-22 DIAGNOSIS — L97929 Non-pressure chronic ulcer of unspecified part of left lower leg with unspecified severity: Secondary | ICD-10-CM | POA: Diagnosis not present

## 2014-06-23 NOTE — Progress Notes (Signed)
[facility]  NAME:  Lindsay [name], Lindsay [name]              ACCOUNT NO.:  [id number]  MEDICAL RECORD NO.:  [id number]      DATE OF BIRTH:  [birth year]  PHYSICIAN:  Ricard Dillon, M.D. VISIT DATE:  06/22/2014                                  OFFICE VISIT   CHIEF COMPLAINT:  Review of extensive wound on her left lower leg.  HISTORY OF PRESENT ILLNESS:  Lindsay [name] is a lady we actually know from a stay here in the late part of 2014 until April  2015. This was related to a venous insufficiency wound on her right leg   The wound on the right side resolved.  Relevant studies at that time included bilateral arterial studies which were normal with normal ABIs and normal multiphasic waveforms.  She also had a venous study on the right leg which did not show any evidence of deep thrombosis, superficial thrombophlebitis, or gross venous reflux.  The patient tells me that she was well until May 25, 2014, when she noted a tiny spot on her left leg.  This rapidly expanded and she was admitted to hospital from May 21, 2014 through May 24, 2014 with cellulitis of the lower leg.  Culture of this area grew a few coag-negative staph.  She developed allergic angioedema to Primaxin, and she was transitioned to Cipro and doxycycline.  She tolerated this well. Since her discharge from the hospital, the area has continued to get worse.  She is still using SilvaSorb gel, Mepilex cover.  PAST MEDICAL HISTORY:  Hypertension and 2 previous wounds on the right leg as described in the HPI.  PAST SURGICAL HISTORY:  I and D of the right leg ulcer on two separate occasions during her stay here earlier this year.  PHYSICAL EXAMINATION:  VITAL SIGNS:  Temperature is 98.6, pulse 96, respirations 14, blood pressure 128/72. RESPIRATORY:  Clear air entry bilaterally. CARDIAC:  Heart sounds are normal.  There are no murmurs. EXTREMITIES:  Her ABI calculated in this clinic was 1.21 on the  left. On the lateral aspect of her left calf is an extensive area.  There is swelling with islands of abnormal skin with interspersed small crater like ulcerations.  The ulcerations have an adherent eschar.  The entire area is swollen but not particularly painful.  Looking at the skin on her bilateral legs suggests significant stasis physiology in spite of the negative venous study from earlier this year.  IMPRESSION/PLAN:  Venous stasis physiology complicated by cellulitis, now with extensive area of skin breakdown as described above.  I do not think there was anything there that I really thought would provide a useful culture at least superficially.  Nevertheless, I did treat her with doxycycline.  We dressed the area with Santyl to the wound area covered with an ABD pad under a Profore Lite wrap.  She is to leave this and place this week.  There was a suggestion in the discharge summary about a biopsy of this area and admittedly the entire wound area looks fairly odd/atypical.  For now, I am going to keep the biopsy thought on hold. The history of a rapidly progressive problem over the last 3-4 weeks would suggest That this was infectious in etiology.   I asked the patient to try  and stay off this as much as she can.  She works as a Teacher, English as a foreign language at [employer].  She is on her feet quite a bit during the day, although she expressed understanding and will try to keep off this as much as she can.  She is not a diabetic.          ______________________________ Ricard Dillon, M.D.     MGR/MEDQ  D:  06/22/2014  T:  06/23/2014  Job:  [id number]

## 2014-06-29 DIAGNOSIS — L97929 Non-pressure chronic ulcer of unspecified part of left lower leg with unspecified severity: Secondary | ICD-10-CM | POA: Diagnosis not present

## 2014-06-29 DIAGNOSIS — L03116 Cellulitis of left lower limb: Secondary | ICD-10-CM | POA: Diagnosis not present

## 2014-06-29 DIAGNOSIS — I87313 Chronic venous hypertension (idiopathic) with ulcer of bilateral lower extremity: Secondary | ICD-10-CM | POA: Diagnosis not present

## 2014-07-06 DIAGNOSIS — I87313 Chronic venous hypertension (idiopathic) with ulcer of bilateral lower extremity: Secondary | ICD-10-CM | POA: Diagnosis not present

## 2014-07-06 DIAGNOSIS — L97929 Non-pressure chronic ulcer of unspecified part of left lower leg with unspecified severity: Secondary | ICD-10-CM | POA: Diagnosis not present

## 2014-07-06 DIAGNOSIS — L03116 Cellulitis of left lower limb: Secondary | ICD-10-CM | POA: Diagnosis not present

## 2014-07-13 DIAGNOSIS — L97929 Non-pressure chronic ulcer of unspecified part of left lower leg with unspecified severity: Secondary | ICD-10-CM | POA: Diagnosis not present

## 2014-07-13 DIAGNOSIS — I87313 Chronic venous hypertension (idiopathic) with ulcer of bilateral lower extremity: Secondary | ICD-10-CM | POA: Diagnosis not present

## 2014-07-13 DIAGNOSIS — L03116 Cellulitis of left lower limb: Secondary | ICD-10-CM | POA: Diagnosis not present

## 2014-07-20 ENCOUNTER — Encounter (HOSPITAL_BASED_OUTPATIENT_CLINIC_OR_DEPARTMENT_OTHER): Payer: 59 | Attending: Internal Medicine

## 2014-07-20 DIAGNOSIS — I87332 Chronic venous hypertension (idiopathic) with ulcer and inflammation of left lower extremity: Secondary | ICD-10-CM | POA: Diagnosis not present

## 2014-07-20 DIAGNOSIS — L97829 Non-pressure chronic ulcer of other part of left lower leg with unspecified severity: Secondary | ICD-10-CM | POA: Diagnosis not present

## 2014-07-27 DIAGNOSIS — I87332 Chronic venous hypertension (idiopathic) with ulcer and inflammation of left lower extremity: Secondary | ICD-10-CM | POA: Diagnosis not present

## 2014-07-27 DIAGNOSIS — L97829 Non-pressure chronic ulcer of other part of left lower leg with unspecified severity: Secondary | ICD-10-CM | POA: Diagnosis not present

## 2014-08-03 DIAGNOSIS — I87332 Chronic venous hypertension (idiopathic) with ulcer and inflammation of left lower extremity: Secondary | ICD-10-CM | POA: Diagnosis not present

## 2014-08-03 DIAGNOSIS — L97829 Non-pressure chronic ulcer of other part of left lower leg with unspecified severity: Secondary | ICD-10-CM | POA: Diagnosis not present

## 2014-08-14 DIAGNOSIS — I87332 Chronic venous hypertension (idiopathic) with ulcer and inflammation of left lower extremity: Secondary | ICD-10-CM | POA: Diagnosis not present

## 2014-08-14 DIAGNOSIS — L97829 Non-pressure chronic ulcer of other part of left lower leg with unspecified severity: Secondary | ICD-10-CM | POA: Diagnosis not present

## 2014-08-15 NOTE — Progress Notes (Signed)
[facility]  NAME:  Lindsay [name], Lindsay [name]                   ACCOUNT NO.:  MEDICAL RECORD NO.:  [id number]      DATE OF BIRTH:  February 28, 1951  PHYSICIAN:  Theodoro Kos, DO       VISIT DATE:  08/14/2014                                  OFFICE VISIT   The patient is a 63 year old female who is here for followup on her left lower extremity chronic venous insufficiency ulcer.  She has undergone multiple treatments in the past by Dr. Jerline Pain including Profore Lite, silver alginate, Aquacel AG and Santyl.  She is showing signs of improvement.  Her medication allergies include ANCEF, HEPARIN, and VANCOMYCIN.  She is on Norvasc, Tylenol and a multivitamin.  She lives at home and is independent.  Her past medical history includes hypertension and she has had an I and D of her right leg in the past, which has since healed.  She has not seen Vascular so far as she knows and a prealbumin has not been checked any time recently.  Review of systems is otherwise negative.  On exam, she is alert, oriented, cooperative, not in any distress, very pleasant.  Her breathing is unlabored.  Her heart rate is regular.  Her abdomen is large, but soft.  Her lower extremity has good pulse.  The wound size is noted, it is on the lateral portion of her left leg chronic venous insufficiency type of wound with small openings, but no deep abrasion noted.  We will have Vascular Surgery do a consult, we will check a pre-albumin, gave her prescription for compression stocking.  She is to elevate her leg.  She is to start doing protein, multivitamin, vitamin C, zinc, and we will see her back in a week.     Theodoro Kos, DO     CS/MEDQ  D:  08/14/2014  T:  08/14/2014  Job:  [id number]

## 2014-08-24 ENCOUNTER — Encounter (HOSPITAL_BASED_OUTPATIENT_CLINIC_OR_DEPARTMENT_OTHER): Payer: 59 | Attending: Internal Medicine

## 2014-08-24 DIAGNOSIS — L97929 Non-pressure chronic ulcer of unspecified part of left lower leg with unspecified severity: Secondary | ICD-10-CM | POA: Diagnosis not present

## 2014-08-24 DIAGNOSIS — I87332 Chronic venous hypertension (idiopathic) with ulcer and inflammation of left lower extremity: Secondary | ICD-10-CM | POA: Diagnosis present

## 2014-08-31 ENCOUNTER — Other Ambulatory Visit (HOSPITAL_COMMUNITY): Payer: Self-pay | Admitting: General Surgery

## 2014-08-31 ENCOUNTER — Ambulatory Visit (HOSPITAL_COMMUNITY)
Admission: RE | Admit: 2014-08-31 | Discharge: 2014-08-31 | Disposition: A | Discharge status: Home / Self Care | Payer: 59 | Source: Ambulatory Visit | Attending: Vascular Surgery | Admitting: Vascular Surgery

## 2014-08-31 DIAGNOSIS — L97929 Non-pressure chronic ulcer of unspecified part of left lower leg with unspecified severity: Secondary | ICD-10-CM

## 2014-08-31 DIAGNOSIS — I87332 Chronic venous hypertension (idiopathic) with ulcer and inflammation of left lower extremity: Secondary | ICD-10-CM | POA: Diagnosis not present

## 2014-09-07 DIAGNOSIS — I87332 Chronic venous hypertension (idiopathic) with ulcer and inflammation of left lower extremity: Secondary | ICD-10-CM | POA: Diagnosis not present

## 2014-09-07 DIAGNOSIS — L97929 Non-pressure chronic ulcer of unspecified part of left lower leg with unspecified severity: Secondary | ICD-10-CM | POA: Diagnosis not present

## 2014-09-14 DIAGNOSIS — L97929 Non-pressure chronic ulcer of unspecified part of left lower leg with unspecified severity: Secondary | ICD-10-CM | POA: Diagnosis not present

## 2014-09-14 DIAGNOSIS — I87332 Chronic venous hypertension (idiopathic) with ulcer and inflammation of left lower extremity: Secondary | ICD-10-CM | POA: Diagnosis not present

## 2014-09-21 ENCOUNTER — Encounter (HOSPITAL_BASED_OUTPATIENT_CLINIC_OR_DEPARTMENT_OTHER): Payer: Medicare Other | Attending: Internal Medicine

## 2014-09-21 DIAGNOSIS — L97829 Non-pressure chronic ulcer of other part of left lower leg with unspecified severity: Secondary | ICD-10-CM | POA: Diagnosis not present

## 2014-09-21 DIAGNOSIS — L03115 Cellulitis of right lower limb: Secondary | ICD-10-CM | POA: Diagnosis not present

## 2014-09-21 DIAGNOSIS — I87332 Chronic venous hypertension (idiopathic) with ulcer and inflammation of left lower extremity: Secondary | ICD-10-CM | POA: Diagnosis not present

## 2014-09-28 DIAGNOSIS — I87332 Chronic venous hypertension (idiopathic) with ulcer and inflammation of left lower extremity: Secondary | ICD-10-CM | POA: Diagnosis not present

## 2014-09-28 DIAGNOSIS — L97829 Non-pressure chronic ulcer of other part of left lower leg with unspecified severity: Secondary | ICD-10-CM | POA: Diagnosis not present

## 2014-09-28 DIAGNOSIS — L03115 Cellulitis of right lower limb: Secondary | ICD-10-CM | POA: Diagnosis not present

## 2014-10-12 DIAGNOSIS — L97829 Non-pressure chronic ulcer of other part of left lower leg with unspecified severity: Secondary | ICD-10-CM | POA: Diagnosis not present

## 2014-10-12 DIAGNOSIS — L03115 Cellulitis of right lower limb: Secondary | ICD-10-CM | POA: Diagnosis not present

## 2014-10-12 DIAGNOSIS — I87332 Chronic venous hypertension (idiopathic) with ulcer and inflammation of left lower extremity: Secondary | ICD-10-CM | POA: Diagnosis not present

## 2014-10-19 ENCOUNTER — Encounter (HOSPITAL_BASED_OUTPATIENT_CLINIC_OR_DEPARTMENT_OTHER): Payer: 59 | Attending: Internal Medicine

## 2014-10-19 DIAGNOSIS — L97929 Non-pressure chronic ulcer of unspecified part of left lower leg with unspecified severity: Secondary | ICD-10-CM | POA: Insufficient documentation

## 2014-10-19 DIAGNOSIS — I87332 Chronic venous hypertension (idiopathic) with ulcer and inflammation of left lower extremity: Secondary | ICD-10-CM | POA: Diagnosis present

## 2014-10-23 ENCOUNTER — Other Ambulatory Visit: Payer: Self-pay | Admitting: Internal Medicine

## 2014-10-23 DIAGNOSIS — D241 Benign neoplasm of right breast: Secondary | ICD-10-CM

## 2014-10-26 DIAGNOSIS — L97929 Non-pressure chronic ulcer of unspecified part of left lower leg with unspecified severity: Secondary | ICD-10-CM | POA: Diagnosis not present

## 2014-10-26 DIAGNOSIS — I87332 Chronic venous hypertension (idiopathic) with ulcer and inflammation of left lower extremity: Secondary | ICD-10-CM | POA: Diagnosis not present

## 2014-10-31 ENCOUNTER — Ambulatory Visit
Admission: RE | Admit: 2014-10-31 | Discharge: 2014-10-31 | Disposition: A | Discharge status: Home / Self Care | Payer: 59 | Source: Ambulatory Visit | Attending: Internal Medicine | Admitting: Internal Medicine

## 2014-10-31 ENCOUNTER — Other Ambulatory Visit: Payer: Self-pay | Admitting: Internal Medicine

## 2014-10-31 DIAGNOSIS — D241 Benign neoplasm of right breast: Secondary | ICD-10-CM

## 2014-10-31 DIAGNOSIS — R921 Mammographic calcification found on diagnostic imaging of breast: Secondary | ICD-10-CM

## 2014-11-02 DIAGNOSIS — I87332 Chronic venous hypertension (idiopathic) with ulcer and inflammation of left lower extremity: Secondary | ICD-10-CM | POA: Diagnosis not present

## 2014-11-02 DIAGNOSIS — L97929 Non-pressure chronic ulcer of unspecified part of left lower leg with unspecified severity: Secondary | ICD-10-CM | POA: Diagnosis not present

## 2014-11-08 ENCOUNTER — Ambulatory Visit
Admission: RE | Admit: 2014-11-08 | Discharge: 2014-11-08 | Disposition: A | Discharge status: Home / Self Care | Payer: 59 | Source: Ambulatory Visit | Attending: Internal Medicine | Admitting: Internal Medicine

## 2014-11-08 ENCOUNTER — Other Ambulatory Visit: Payer: Self-pay | Admitting: Internal Medicine

## 2014-11-08 DIAGNOSIS — R921 Mammographic calcification found on diagnostic imaging of breast: Secondary | ICD-10-CM

## 2014-11-09 ENCOUNTER — Encounter: Payer: Self-pay | Admitting: Surgery

## 2014-11-10 ENCOUNTER — Ambulatory Visit (INDEPENDENT_AMBULATORY_CARE_PROVIDER_SITE_OTHER): Payer: 59 | Admitting: Surgery

## 2014-11-10 ENCOUNTER — Encounter: Payer: Self-pay | Admitting: Surgery

## 2014-11-10 VITALS — BP 181/104 | HR 99 | Ht 63.0 in | Wt 218.0 lb

## 2014-11-10 DIAGNOSIS — I890 Lymphedema, not elsewhere classified: Secondary | ICD-10-CM

## 2014-11-10 NOTE — Progress Notes (Signed)
Patient name: Lindsay [name] MRN: [id number] DOB: [birth year] Sex: female   Referred by: Dr. Dellia Nims  Reason for referral:  Chief Complaint  Patient presents with  . New Evaluation    venous insufficiency - non healing ulcer L LE    HISTORY OF PRESENT ILLNESS: This is a 64 year old female who is referred today for a nonhealing wound on her left leg.  She states that this has been present since October.  She has been evaluated and treated at the wound center with compression and a silver ointment.  It has progressed and has almost healed.  The patient has a history of a right leg ulcer approximately 2 years ago.  This required incision and drainage as well as a split thickness skin graft in December 2014.  At that time she had arterial Doppler studies in February 2015 which were normal.  The patient denies any history of trauma.  She does wear her compression.  The patient's medically managed for hypertension which is well-controlled.  She is a nonsmoker.  Past Medical History  Diagnosis Date  . Hypertension   . Lower extremity ulceration   . Non-healing wound of lower extremity   . Cellulitis     Past Surgical History  Procedure Laterality Date  . Abdominal hysterectomy    . I&d extremity Right 08/23/2013    Procedure: I&D Right Leg Abscess;  Surgeon: Mcarthur Rossetti, MD;  Location: [place];  Service: Orthopedics;  Laterality: Right;  . Application of wound vac Right 08/23/2013    Procedure: APPLICATION OF WOUND VAC;  Surgeon: Mcarthur Rossetti, MD;  Location: [place];  Service: Orthopedics;  Laterality: Right;  . I&d extremity Right 08/27/2013    Procedure: REPEAT IRRIGATION AND DEBRIDEMENT OF RIGHT LEG WOUND;  Surgeon: Mcarthur Rossetti, MD;  Location: [place];  Service: Orthopedics;  Laterality: Right;  . I&d extremity Right 08/30/2013    Procedure: Repeat IRRIGATION AND DEBRIDEMENT Right leg, skin graft;  Surgeon: Mcarthur Rossetti, MD;  Location: [place];   Service: Orthopedics;  Laterality: Right;    History   Social History  . Marital Status: Divorced    Spouse Name: N/A  . Number of Children: N/A  . Years of Education: N/A   Occupational History  . Not on file.   Social History Main Topics  . Smoking status: Never Smoker   . Smokeless tobacco: Never Used  . Alcohol Use: No  . Drug Use: No  . Sexual Activity: Yes   Other Topics Concern  . Not on file   Social History Narrative    Family History  Problem Relation Age of Onset  . CAD Mother   . Hypertension Mother     Allergies as of 11/10/2014 - Review Complete 11/10/2014  Allergen Reaction Noted  . Ancef [cefazolin] Rash 09/21/2013  . Primaxin [imipenem] Swelling 06/02/2014  . Heparin  09/24/2013  . Vancomycin Rash 08/23/2013    Current Outpatient Prescriptions on File Prior to Visit  Medication Sig Dispense Refill  . acetaminophen (TYLENOL) 325 MG tablet Take 2 tablets (650 mg total) by mouth every 6 (six) hours as needed for mild pain or fever.    Marland Kitchen amLODipine (NORVASC) 10 MG tablet Take 1 tablet (10 mg total) by mouth daily. 30 tablet 0  . HYDROcodone-acetaminophen (NORCO/VICODIN) 5-325 MG per tablet Take 1 tablet by mouth every 6 (six) hours as needed. 20 tablet 0  . Multiple Vitamins-Minerals (CENTRUM ADULTS PO) Take 1 tablet by mouth  daily.    . ciprofloxacin (CIPRO) 500 MG tablet Take 1 tablet (500 mg total) by mouth 2 (two) times daily. (Patient not taking: Reported on 11/10/2014) 14 tablet 0  . doxycycline (VIBRA-TABS) 100 MG tablet Take 1 tablet (100 mg total) by mouth every 12 (twelve) hours. (Patient not taking: Reported on 11/10/2014) 14 tablet 0   No current facility-administered medications on file prior to visit.     REVIEW OF SYSTEMS: Please see history of present illness, otherwise all systems are negative  PHYSICAL EXAMINATION: General: The patient appears their stated age.  Vital signs are BP 181/104 mmHg  Pulse 99  Ht 5\' 3"  (1.6 m)  Wt  218 lb (98.884 kg)  BMI 38.63 kg/m2  SpO2 100% HEENT:  No gross abnormalities Pulmonary: Respirations are non-labored Abdomen: Soft and non-tender  Musculoskeletal: There are no major deformities.   Neurologic: No focal weakness or paresthesias are detected, Skin: Punctate residual ulceration on the lateral side a left foot which is edematous Psychiatric: The patient has normal affect. Cardiovascular: There is a regular rate and rhythm without significant murmur appreciated.  Brisk anterior tibial Doppler signal.  Biphasic posterior tibial Doppler signal, left leg  Diagnostic Studies: I have reviewed her venous ultrasound from today.  There is no evidence of deep vein thrombosis or reflux.  There is no evidence of superficial venous thrombosis or reflux    Assessment:  Left leg ulcer Plan: The patient's ulcer has nearly healed with compression therapy and wound care.  Based on my examination today with hand-held Doppler, she has excellent arterial blood flow to her foot.  Her venous insufficiency workup was normal.  I suspect that her swelling is secondary to lymphedema.  I stressed the importance of compression therapy once her wound heals.  I am also making a referral to the lymphedema clinic.  She was given information for acquiring compression stockings at a lower cost.  She will follow up on an as-needed basis.     Eldridge Abrahams, M.D. [facility] Office: [phone] Pager:  [phone]

## 2014-11-16 ENCOUNTER — Encounter (HOSPITAL_BASED_OUTPATIENT_CLINIC_OR_DEPARTMENT_OTHER): Payer: 59 | Attending: Internal Medicine

## 2014-11-16 DIAGNOSIS — L97821 Non-pressure chronic ulcer of other part of left lower leg limited to breakdown of skin: Secondary | ICD-10-CM | POA: Diagnosis present

## 2014-11-16 DIAGNOSIS — I87332 Chronic venous hypertension (idiopathic) with ulcer and inflammation of left lower extremity: Secondary | ICD-10-CM | POA: Insufficient documentation

## 2014-11-23 DIAGNOSIS — L97821 Non-pressure chronic ulcer of other part of left lower leg limited to breakdown of skin: Secondary | ICD-10-CM | POA: Diagnosis not present

## 2014-11-23 DIAGNOSIS — I87332 Chronic venous hypertension (idiopathic) with ulcer and inflammation of left lower extremity: Secondary | ICD-10-CM | POA: Diagnosis not present

## 2014-12-06 ENCOUNTER — Other Ambulatory Visit: Payer: Self-pay | Admitting: General Surgery

## 2014-12-06 DIAGNOSIS — D241 Benign neoplasm of right breast: Secondary | ICD-10-CM

## 2014-12-06 DIAGNOSIS — R928 Other abnormal and inconclusive findings on diagnostic imaging of breast: Secondary | ICD-10-CM

## 2014-12-14 DIAGNOSIS — L97821 Non-pressure chronic ulcer of other part of left lower leg limited to breakdown of skin: Secondary | ICD-10-CM | POA: Diagnosis not present

## 2014-12-14 DIAGNOSIS — I87332 Chronic venous hypertension (idiopathic) with ulcer and inflammation of left lower extremity: Secondary | ICD-10-CM | POA: Diagnosis not present

## 2015-05-09 ENCOUNTER — Encounter (HOSPITAL_BASED_OUTPATIENT_CLINIC_OR_DEPARTMENT_OTHER): Payer: 59 | Attending: Surgery

## 2015-05-09 DIAGNOSIS — L97912 Non-pressure chronic ulcer of unspecified part of right lower leg with fat layer exposed: Secondary | ICD-10-CM | POA: Insufficient documentation

## 2015-05-09 DIAGNOSIS — I10 Essential (primary) hypertension: Secondary | ICD-10-CM | POA: Insufficient documentation

## 2015-05-09 DIAGNOSIS — I87311 Chronic venous hypertension (idiopathic) with ulcer of right lower extremity: Secondary | ICD-10-CM | POA: Diagnosis not present

## 2015-05-16 DIAGNOSIS — L97912 Non-pressure chronic ulcer of unspecified part of right lower leg with fat layer exposed: Secondary | ICD-10-CM | POA: Diagnosis not present

## 2015-05-23 ENCOUNTER — Encounter (HOSPITAL_BASED_OUTPATIENT_CLINIC_OR_DEPARTMENT_OTHER): Payer: Managed Care, Other (non HMO) | Attending: Surgery

## 2015-05-23 ENCOUNTER — Other Ambulatory Visit: Payer: Self-pay | Admitting: Certified Registered Nurse Anesthetist

## 2015-05-23 DIAGNOSIS — L97912 Non-pressure chronic ulcer of unspecified part of right lower leg with fat layer exposed: Secondary | ICD-10-CM | POA: Insufficient documentation

## 2015-05-23 DIAGNOSIS — I10 Essential (primary) hypertension: Secondary | ICD-10-CM | POA: Insufficient documentation

## 2015-05-23 DIAGNOSIS — I87311 Chronic venous hypertension (idiopathic) with ulcer of right lower extremity: Secondary | ICD-10-CM | POA: Diagnosis not present

## 2015-05-30 ENCOUNTER — Ambulatory Visit (HOSPITAL_COMMUNITY)
Admission: RE | Admit: 2015-05-30 | Discharge: 2015-05-30 | Disposition: A | Discharge status: Home / Self Care | Payer: Managed Care, Other (non HMO) | Source: Ambulatory Visit | Attending: Vascular Surgery | Admitting: Vascular Surgery

## 2015-05-30 ENCOUNTER — Other Ambulatory Visit: Payer: Self-pay | Admitting: Surgery

## 2015-05-30 DIAGNOSIS — L97319 Non-pressure chronic ulcer of right ankle with unspecified severity: Secondary | ICD-10-CM

## 2015-05-30 DIAGNOSIS — L97912 Non-pressure chronic ulcer of unspecified part of right lower leg with fat layer exposed: Secondary | ICD-10-CM | POA: Diagnosis not present

## 2015-06-06 DIAGNOSIS — L97912 Non-pressure chronic ulcer of unspecified part of right lower leg with fat layer exposed: Secondary | ICD-10-CM | POA: Diagnosis not present

## 2015-06-13 DIAGNOSIS — L97912 Non-pressure chronic ulcer of unspecified part of right lower leg with fat layer exposed: Secondary | ICD-10-CM | POA: Diagnosis not present

## 2015-06-20 ENCOUNTER — Encounter (HOSPITAL_BASED_OUTPATIENT_CLINIC_OR_DEPARTMENT_OTHER): Payer: Managed Care, Other (non HMO) | Attending: Surgery

## 2015-06-20 DIAGNOSIS — M793 Panniculitis, unspecified: Secondary | ICD-10-CM | POA: Diagnosis not present

## 2015-06-20 DIAGNOSIS — I10 Essential (primary) hypertension: Secondary | ICD-10-CM | POA: Diagnosis not present

## 2015-06-20 DIAGNOSIS — L97811 Non-pressure chronic ulcer of other part of right lower leg limited to breakdown of skin: Secondary | ICD-10-CM | POA: Insufficient documentation

## 2015-06-20 DIAGNOSIS — I872 Venous insufficiency (chronic) (peripheral): Secondary | ICD-10-CM | POA: Diagnosis not present

## 2015-06-27 DIAGNOSIS — L97811 Non-pressure chronic ulcer of other part of right lower leg limited to breakdown of skin: Secondary | ICD-10-CM | POA: Diagnosis not present

## 2016-01-28 ENCOUNTER — Emergency Department (HOSPITAL_COMMUNITY): Payer: Managed Care, Other (non HMO)

## 2016-01-28 ENCOUNTER — Encounter (HOSPITAL_COMMUNITY): Payer: Self-pay | Admitting: *Deleted

## 2016-01-28 ENCOUNTER — Inpatient Hospital Stay (HOSPITAL_COMMUNITY)
Admission: EM | Admit: 2016-01-28 | Discharge: 2016-02-05 | DRG: 596 | Disposition: A | Discharge status: Home Health | Payer: Managed Care, Other (non HMO) | Attending: Internal Medicine | Admitting: Internal Medicine

## 2016-01-28 DIAGNOSIS — D75829 Heparin-induced thrombocytopenia, unspecified: Secondary | ICD-10-CM | POA: Diagnosis present

## 2016-01-28 DIAGNOSIS — E0590 Thyrotoxicosis, unspecified without thyrotoxic crisis or storm: Secondary | ICD-10-CM | POA: Diagnosis present

## 2016-01-28 DIAGNOSIS — Z79899 Other long term (current) drug therapy: Secondary | ICD-10-CM | POA: Diagnosis not present

## 2016-01-28 DIAGNOSIS — I872 Venous insufficiency (chronic) (peripheral): Secondary | ICD-10-CM | POA: Diagnosis present

## 2016-01-28 DIAGNOSIS — L03116 Cellulitis of left lower limb: Secondary | ICD-10-CM | POA: Diagnosis present

## 2016-01-28 DIAGNOSIS — R591 Generalized enlarged lymph nodes: Secondary | ICD-10-CM | POA: Insufficient documentation

## 2016-01-28 DIAGNOSIS — E039 Hypothyroidism, unspecified: Secondary | ICD-10-CM | POA: Diagnosis present

## 2016-01-28 DIAGNOSIS — L52 Erythema nodosum: Principal | ICD-10-CM | POA: Diagnosis present

## 2016-01-28 DIAGNOSIS — I878 Other specified disorders of veins: Secondary | ICD-10-CM | POA: Diagnosis present

## 2016-01-28 DIAGNOSIS — I890 Lymphedema, not elsewhere classified: Secondary | ICD-10-CM

## 2016-01-28 DIAGNOSIS — Z881 Allergy status to other antibiotic agents status: Secondary | ICD-10-CM

## 2016-01-28 DIAGNOSIS — Z8249 Family history of ischemic heart disease and other diseases of the circulatory system: Secondary | ICD-10-CM

## 2016-01-28 DIAGNOSIS — L03115 Cellulitis of right lower limb: Secondary | ICD-10-CM | POA: Diagnosis not present

## 2016-01-28 DIAGNOSIS — D7582 Heparin induced thrombocytopenia (HIT): Secondary | ICD-10-CM | POA: Diagnosis not present

## 2016-01-28 DIAGNOSIS — E872 Acidosis: Secondary | ICD-10-CM | POA: Diagnosis present

## 2016-01-28 DIAGNOSIS — E8729 Other acidosis: Secondary | ICD-10-CM | POA: Diagnosis present

## 2016-01-28 DIAGNOSIS — R590 Localized enlarged lymph nodes: Secondary | ICD-10-CM | POA: Diagnosis present

## 2016-01-28 DIAGNOSIS — T148XXA Other injury of unspecified body region, initial encounter: Secondary | ICD-10-CM

## 2016-01-28 DIAGNOSIS — I10 Essential (primary) hypertension: Secondary | ICD-10-CM | POA: Diagnosis present

## 2016-01-28 DIAGNOSIS — L089 Local infection of the skin and subcutaneous tissue, unspecified: Secondary | ICD-10-CM | POA: Insufficient documentation

## 2016-01-28 DIAGNOSIS — Z888 Allergy status to other drugs, medicaments and biological substances status: Secondary | ICD-10-CM

## 2016-01-28 DIAGNOSIS — R599 Enlarged lymph nodes, unspecified: Secondary | ICD-10-CM | POA: Insufficient documentation

## 2016-01-28 HISTORY — DX: Lymphedema, not elsewhere classified: I89.0

## 2016-01-28 LAB — CBC WITH DIFFERENTIAL/PLATELET
Basophils Absolute: 0 10*3/uL (ref 0.0–0.1)
Basophils Relative: 0 %
Eosinophils Absolute: 0 10*3/uL (ref 0.0–0.7)
Eosinophils Relative: 0 %
HCT: 42.1 % (ref 36.0–46.0)
Hemoglobin: 13.7 g/dL (ref 12.0–15.0)
Lymphocytes Relative: 10 %
Lymphs Abs: 1.6 10*3/uL (ref 0.7–4.0)
MCH: 27.2 pg (ref 26.0–34.0)
MCHC: 32.5 g/dL (ref 30.0–36.0)
MCV: 83.7 fL (ref 78.0–100.0)
Monocytes Absolute: 1.2 10*3/uL — ABNORMAL HIGH (ref 0.1–1.0)
Monocytes Relative: 7 %
Neutro Abs: 13.4 10*3/uL — ABNORMAL HIGH (ref 1.7–7.7)
Neutrophils Relative %: 83 %
Platelets: 495 10*3/uL — ABNORMAL HIGH (ref 150–400)
RBC: 5.03 MIL/uL (ref 3.87–5.11)
RDW: 14.2 % (ref 11.5–15.5)
WBC: 16.2 10*3/uL — ABNORMAL HIGH (ref 4.0–10.5)

## 2016-01-28 LAB — BASIC METABOLIC PANEL
Anion gap: 16 — ABNORMAL HIGH (ref 5–15)
BUN: 14 mg/dL (ref 6–20)
CO2: 19 mmol/L — ABNORMAL LOW (ref 22–32)
Calcium: 9.2 mg/dL (ref 8.9–10.3)
Chloride: 104 mmol/L (ref 101–111)
Creatinine, Ser: 0.98 mg/dL (ref 0.44–1.00)
GFR calc Af Amer: >60 mL/min (ref >60)
GFR calc non Af Amer: >60 mL/min (ref >60)
Glucose, Bld: 102 mg/dL — ABNORMAL HIGH (ref 65–99)
Potassium: 3.5 mmol/L (ref 3.5–5.1)
Sodium: 139 mmol/L (ref 135–145)

## 2016-01-28 LAB — TSH: TSH: 4.883 u[IU]/mL — ABNORMAL HIGH (ref 0.350–4.500)

## 2016-01-28 LAB — SEDIMENTATION RATE: Sed Rate: 106 mm/hr — ABNORMAL HIGH (ref 0–22)

## 2016-01-28 MED ORDER — FONDAPARINUX SODIUM 2.5 MG/0.5ML ~~LOC~~ SOLN
2.5000 mg | Freq: Every day | SUBCUTANEOUS | Status: DC
  Administered 2016-01-29 – 2016-02-03 (×6): 2.5 mg via SUBCUTANEOUS
  Filled 2016-01-28 (×7): qty 0.5

## 2016-01-28 MED ORDER — OXYCODONE HCL 5 MG PO TABS: 5.0000 mg | ORAL_TABLET | ORAL | Status: DC | PRN

## 2016-01-28 MED ORDER — CIPROFLOXACIN IN D5W 400 MG/200ML IV SOLN
400.0000 mg | Freq: Two times a day (BID) | INTRAVENOUS | Status: DC
  Administered 2016-01-28 – 2016-01-29 (×2): 400 mg via INTRAVENOUS
  Filled 2016-01-28 (×3): qty 200

## 2016-01-28 MED ORDER — CLOBETASOL PROPIONATE 0.05 % EX CREA
1.0000 "application " | TOPICAL_CREAM | Freq: Every day | CUTANEOUS | Status: DC
  Administered 2016-01-31 – 2016-02-05 (×6): 1 via TOPICAL
  Filled 2016-01-28 (×3): qty 15

## 2016-01-28 MED ORDER — DOXYCYCLINE HYCLATE 100 MG IV SOLR
100.0000 mg | Freq: Two times a day (BID) | INTRAVENOUS | Status: DC
  Filled 2016-01-28: qty 100

## 2016-01-28 MED ORDER — HYDROMORPHONE HCL 1 MG/ML IJ SOLN
1.0000 mg | Freq: Once | INTRAMUSCULAR | Status: AC
  Administered 2016-01-28: 1 mg via INTRAVENOUS
  Filled 2016-01-28: qty 1

## 2016-01-28 MED ORDER — CLINDAMYCIN PHOSPHATE 600 MG/50ML IV SOLN
600.0000 mg | Freq: Once | INTRAVENOUS | Status: AC
  Administered 2016-01-28: 600 mg via INTRAVENOUS
  Filled 2016-01-28: qty 50

## 2016-01-28 MED ORDER — DOXYCYCLINE HYCLATE 100 MG PO TABS: 100.0000 mg | ORAL_TABLET | Freq: Two times a day (BID) | ORAL | Status: DC

## 2016-01-28 MED ORDER — POTASSIUM IODIDE 1 GM/ML PO SOLN
500.0000 mg | Freq: Three times a day (TID) | ORAL | Status: DC
  Administered 2016-01-28 – 2016-01-31 (×9): 500 mg via ORAL
  Filled 2016-01-28 (×8): qty 30

## 2016-01-28 MED ORDER — AMLODIPINE BESYLATE 5 MG PO TABS
5.0000 mg | ORAL_TABLET | Freq: Every day | ORAL | Status: DC
  Administered 2016-01-28 – 2016-02-05 (×9): 5 mg via ORAL
  Filled 2016-01-28 (×9): qty 1

## 2016-01-28 MED ORDER — ACETAMINOPHEN 325 MG PO TABS
650.0000 mg | ORAL_TABLET | ORAL | Status: DC | PRN
  Administered 2016-02-02: 650 mg via ORAL
  Filled 2016-01-28: qty 2

## 2016-01-28 MED ORDER — SODIUM CHLORIDE 0.9 % IV SOLN
INTRAVENOUS | Status: DC
  Administered 2016-01-28 – 2016-01-29 (×3): via INTRAVENOUS

## 2016-01-28 MED ORDER — MORPHINE SULFATE (PF) 2 MG/ML IV SOLN
1.0000 mg | INTRAVENOUS | Status: DC | PRN
  Administered 2016-01-29: 2 mg via INTRAVENOUS
  Administered 2016-01-29: 4 mg via INTRAVENOUS
  Filled 2016-01-28: qty 2
  Filled 2016-01-28: qty 1

## 2016-01-28 MED ORDER — ONDANSETRON HCL 4 MG/2ML IJ SOLN: 4.0000 mg | Freq: Three times a day (TID) | INTRAMUSCULAR | Status: AC | PRN

## 2016-01-28 MED ORDER — DOXYCYCLINE HYCLATE 100 MG IV SOLR
100.0000 mg | Freq: Two times a day (BID) | INTRAVENOUS | Status: DC
  Administered 2016-01-28 – 2016-01-29 (×2): 100 mg via INTRAVENOUS
  Filled 2016-01-28 (×4): qty 100

## 2016-01-28 MED ORDER — HYDROMORPHONE HCL 1 MG/ML IJ SOLN
1.0000 mg | INTRAMUSCULAR | Status: DC | PRN
  Administered 2016-01-28 (×2): 1 mg via INTRAVENOUS
  Filled 2016-01-28 (×2): qty 1

## 2016-01-28 MED ORDER — OXYCODONE HCL 5 MG PO TABS
5.0000 mg | ORAL_TABLET | ORAL | Status: DC | PRN
  Administered 2016-01-29 – 2016-02-04 (×10): 10 mg via ORAL
  Filled 2016-01-28 (×10): qty 2

## 2016-01-28 NOTE — ED Notes (Signed)
Lab coming to draw another set of blood cultures and sed rate.

## 2016-01-28 NOTE — H&P (Signed)
History and Physical    Lindsay [name] [id number] DOB: 03/12/1951 DOA: 01/28/2016   PCP: Merrilee Seashore, MD   Patient coming from/Resides with: Private residence, lives with her son and her boyfriend  Chief Complaint: Nonhealing bilateral lower extremity ulcers  HPI: Lindsay [name] is a 65 y.o. female with medical history significant for prior nonhealing wound of the left leg previously treated with medical therapy requiring hospitalization in 2015. Cultures were positive for MSSA and pseudomonas and she was treated with an extended course of Cipro and doxycycline. During the hospitalization she had allergic angioedema from Primaxin so this was discontinued. She also was HIT positive but SRA negative. Prior to this she also had a right lower extremity ulcer that required surgical I/D as well as STSG in 2014. Undergone prior arterial duplex studies as well as venous insufficiency studies that were normal. She has been evaluated by [facility] who felt the patient's chronic recurrent edema was related to lymphedema. She has done well with compressive therapy and has been compliant.  Patient reports that about a week and a half ago she developed a "knot" on the right lower leg and the right lateral thigh that has subsequently evolved into the ulcers that you see at present. Of note, this was similar to her presentations both in 2014 and 2015. She has been followed by her dermatologist since the evolution of the new lesions and has been using clobetasol cream without improvement in symptoms. After evaluation today in the office the dermatologist [name]) sent her over for further evaluation and treatment in the ER.  ED Course:  PO temp 98.5-BP 171/79-pulse 13 test respirations 20-room air saturations 98% X-ray right tibia/fibula: Soft tissue inflammatory change without evidence of bony involvement Lab data: Sodium 139, potassium 3.5, CO2 19, anion gap 16, BUN 14, creatinine 0.98, WBC 16,200 with  neutrophils 83% and absolute neutrophils 13.4%, hemoglobin 13.7, platelets 495,000; wound Culture obtained in the ER Dilaudid 1 mg 2 doses Clindamycin 600 mg IV 1 dose  Review of Systems:  In addition to the HPI above,  No Fever-chills, myalgias or other constitutional symptoms No Headache, changes with Vision or hearing, new weakness, tingling, numbness in any extremity, No problems swallowing food or Liquids, indigestion/reflux No Chest pain, Cough or Shortness of Breath, palpitations, orthopnea or DOE No Abdominal pain, N/V; no melena or hematochezia, no dark tarry stools, Bowel movements are regular, No dysuria, hematuria or flank pain No new skin bruises, No new joints pains-aches No recent weight gain or loss No polyuria, polydypsia or polyphagia,   Past Medical History  Diagnosis Date  . Hypertension   . Lower extremity ulceration ([place])   . Non-healing wound of lower extremity   . Cellulitis     Past Surgical History  Procedure Laterality Date  . Abdominal hysterectomy    . I&d extremity Right 08/23/2013    Procedure: I&D Right Leg Abscess;  Surgeon: Mcarthur Rossetti, MD;  Location: [place];  Service: Orthopedics;  Laterality: Right;  . Application of wound vac Right 08/23/2013    Procedure: APPLICATION OF WOUND VAC;  Surgeon: Mcarthur Rossetti, MD;  Location: [place];  Service: Orthopedics;  Laterality: Right;  . I&d extremity Right 08/27/2013    Procedure: REPEAT IRRIGATION AND DEBRIDEMENT OF RIGHT LEG WOUND;  Surgeon: Mcarthur Rossetti, MD;  Location: [place];  Service: Orthopedics;  Laterality: Right;  . I&d extremity Right 08/30/2013    Procedure: Repeat IRRIGATION AND DEBRIDEMENT Right leg, skin graft;  Surgeon: Mcarthur Rossetti,  MD;  Location: [place];  Service: Orthopedics;  Laterality: Right;     reports that she has never smoked. She has never used smokeless tobacco. She reports that she does not drink alcohol or use illicit drugs.  Mobility:  Rolling walker Work history: Currently works as Nurse, mental health at [employer]  . Ancef [Cefazolin] Rash  . Primaxin [Imipenem] Swelling    Developed lip swelling post administration of Primaxin even though she had tolerated this in the past.  . Heparin     Possible HIT (HIT antibody positive; SRA negative)  . Vancomycin Rash    Family History  Problem Relation Age of Onset  . CAD Mother   . Hypertension Mother      Prior to Admission medications   Medication Sig Start Date End Date Taking? Authorizing Provider  acetaminophen (TYLENOL) 325 MG tablet Take 2 tablets (650 mg total) by mouth every 6 (six) hours as needed for mild pain or fever. 06/03/14  Yes Delfina Redwood, MD  amLODipine (NORVASC) 5 MG tablet Take 5 mg by mouth daily.   Yes Historical Provider, MD  clobetasol cream (TEMOVATE) [id number] % Apply 1 application topically daily. 01/08/16  Yes Historical Provider, MD  hydrochlorothiazide (HYDRODIURIL) 25 MG tablet Take 25 mg by mouth daily. 10/20/14  Yes Historical Provider, MD  Multiple Vitamins-Minerals (CENTRUM ADULTS PO) Take 1 tablet by mouth daily.   Yes Historical Provider, MD  potassium iodide (SSKI) 1 GM/ML solution Take by mouth 3 (three) times daily. Started at 5 drops, then increase 1 drop daily till patient reaches 15 drops.   Yes Historical Provider, MD  ciprofloxacin (CIPRO) 500 MG tablet Take 1 tablet (500 mg total) by mouth 2 (two) times daily. Patient not taking: Reported on 11/10/2014 06/03/14   Delfina Redwood, MD  doxycycline (VIBRA-TABS) 100 MG tablet Take 1 tablet (100 mg total) by mouth every 12 (twelve) hours. Patient not taking: Reported on 11/10/2014 06/03/14   Delfina Redwood, MD  HYDROcodone-acetaminophen (NORCO/VICODIN) 5-325 MG per tablet Take 1 tablet by mouth every 6 (six) hours as needed. 06/03/14   Delfina Redwood, MD    Physical Exam: Filed Vitals:   01/28/16 1330 01/28/16 1418 01/28/16  1430 01/28/16 1445  BP: 172/83 165/88 154/84 156/83  Pulse: 102 104 102 99  Temp:      TempSrc:      Resp: 21 20 23 17   Height:      Weight:      SpO2: 96% 97% 97% 97%      Constitutional: NAD, calm, comfortable Eyes: PERRL, lids and conjunctivae normal ENMT: Mucous membranes are moist. Posterior pharynx clear of any exudate or lesions.Normal dentition.  Neck: normal, supple, no masses, no thyromegaly Respiratory: clear to auscultation bilaterally, no wheezing, no crackles. Normal respiratory effort. No accessory muscle use.  Cardiovascular: Regular rate and rhythm, no murmurs / rubs / gallops. No extremity edema. 2+ pedal pulses. No carotid bruits.  Abdomen: no tenderness, no masses palpated. No hepatosplenomegaly. Bowel sounds positive.  Musculoskeletal: no clubbing / cyanosis. No joint deformity upper and lower extremities. Good ROM, no contractures. Normal muscle tone.  Skin: no rashes, Patient has 2 separate wounds: 1 on the right lower extremity at the dorsal surface of the tibia at the ankle level-she also has another lesion on the left lateral thigh-both of these are draining a combination of serosanguineous as well as purulent fibrinous material and are very tender to the touch  with associated erythema and warmth (please see pictures below-patient's permission received prior to taking photographs and inserting into chart) Neurologic: CN 2-12 grossly intact. Sensation intact, DTR normal. Strength 5/5 x all 4 extremities.  Psychiatric: Normal judgment and insight. Alert and oriented x 3. Normal mood.      DORSUM RIGHT FOOT AT ANKLE   LEFT LATERAL THIGH   Labs on Admission: I have personally reviewed following labs and imaging studies  CBC:  Recent Labs Lab 01/28/16 1300  WBC 16.2*  NEUTROABS 13.4*  HGB 13.7  HCT 42.1  MCV 83.7  PLT [id number]*   Basic Metabolic Panel:  Recent Labs Lab 01/28/16 1300  NA 139  K 3.5  CL 104  CO2 19*  GLUCOSE 102*  BUN 14    CREATININE 0.98  CALCIUM 9.2   GFR: Estimated Creatinine Clearance: 66.3 mL/min (by C-G formula based on Cr of 0.98). Liver Function Tests: No results for input(s): AST, ALT, ALKPHOS, BILITOT, PROT, ALBUMIN in the last 168 hours. No results for input(s): LIPASE, AMYLASE in the last 168 hours. No results for input(s): AMMONIA in the last 168 hours. Coagulation Profile: No results for input(s): INR, PROTIME in the last 168 hours. Cardiac Enzymes: No results for input(s): CKTOTAL, CKMB, CKMBINDEX, TROPONINI in the last 168 hours. BNP (last 3 results) No results for input(s): PROBNP in the last 8760 hours. HbA1C: No results for input(s): HGBA1C in the last 72 hours. CBG: No results for input(s): GLUCAP in the last 168 hours. Lipid Profile: No results for input(s): CHOL, HDL, LDLCALC, TRIG, CHOLHDL, LDLDIRECT in the last 72 hours. Thyroid Function Tests: No results for input(s): TSH, T4TOTAL, FREET4, T3FREE, THYROIDAB in the last 72 hours. Anemia Panel: No results for input(s): VITAMINB12, FOLATE, FERRITIN, TIBC, IRON, RETICCTPCT in the last 72 hours. Urine analysis:    Component Value Date/Time   COLORURINE YELLOW 06/01/2014 0006   APPEARANCEUR CLOUDY* 06/01/2014 0006   LABSPEC 1.007 06/01/2014 0006   PHURINE 6.5 06/01/2014 0006   GLUCOSEU NEGATIVE 06/01/2014 0006   HGBUR NEGATIVE 06/01/2014 0006   BILIRUBINUR NEGATIVE 06/01/2014 0006   KETONESUR NEGATIVE 06/01/2014 0006   PROTEINUR NEGATIVE 06/01/2014 0006   UROBILINOGEN 0.2 06/01/2014 0006   NITRITE NEGATIVE 06/01/2014 0006   LEUKOCYTESUR NEGATIVE 06/01/2014 0006   Sepsis Labs: @LABRCNTIP (procalcitonin:4,lacticidven:4) )No results found for this or any previous visit (from the past 240 hour(s)).   Radiological Exams on Admission: Dg Tibia/fibula Left  01/28/2016  CLINICAL DATA:  Draining wound in lateral left calf EXAM: LEFT TIBIA AND FIBULA - 2 VIEW COMPARISON:  None. FINDINGS: No acute bony abnormality is noted. No  fracture or dislocation is seen. No bony erosion is noted. Soft tissue swelling is noted laterally consistent with the given clinical history. IMPRESSION: Soft tissue inflammatory change without evidence of bony involvement. Electronically Signed   By: Inez Catalina M.D.   On: 01/28/2016 14:31     Assessment/Plan Principal Problem:   Cellulitis of both lower extremities -Has had 2 separate incidents of this in the past but never on both legs at the same time -previous coag-negative staph as well as Pseudomonas therefore empiric coverage with IV Cipro and doxycycline -Wound culture obtained in ER -Check blood cultures -WOC RN to evaluate; patient has responded to wound hydrotherapy in the past -Combination of Tylenol, oxycodone and IV morphine for pain management  Active Problems:   Lymphedema of both lower extremities -Patient has been compliant with compression hose -Due to current pain unable to utilize compression hose  at this juncture    History of HIT (heparin-induced thrombocytopenia)  -Discussed with pharmacy who have recommended we use fondaparinux for DVT prophylaxis -Due to location of wounds on legs she is not a candidate for STDs    Increased anion gap metabolic acidosis -Patient had abnormal serum CO2 as well as elevated anion gap with normal renal function and normal serum glucose -Question from volume contraction and setting of preadmission use of Dyazide diuretic -We'll hold thiazide diuretic for now and give low-flow IV fluids    HTN (hypertension) -Continue Norvasc    Hyperthyroidism -Apparent recent diagnosis and patient currently in process of uptitrating SSKI -Current home dosage clarified with pharmacy and has been ordered -Check TSH      DVT prophylaxis: Fondaparinux Code Status: Full code Family Communication: No family at bedside at time of admission Disposition Plan: Anticipate we'll discharge to previous home environment once medically  stable Consults called: None Admission status: Medical floor/inpatient    Yandel Zeiner L. ANP-BC [facility] Pager [phone]   If 7PM-7AM, please contact night-coverage [email or web address] Password [email or web address]  01/28/2016, 3:23 PM

## 2016-01-28 NOTE — ED Provider Notes (Signed)
CSN: [id number]     Arrival date & time 01/28/16  1202 History   First MD Initiated Contact with Patient 01/28/16 1239     Chief Complaint  Patient presents with  . Wound Infection     (Consider location/radiation/quality/duration/timing/severity/associated sxs/prior Treatment) The history is provided by the patient.     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  States she has had frequent wounds on her bilateral lower legs since 2014, general present as a nodule and then progress to large wounds.  Is seen by dermatology Dr [name] and wound care.  The wound on her left lower extremity began approximately 10 days ago as a nodule, usual course.  Pt developed increased pain and discharge approximately 4-5 days ago and redness surrounding the wound yesterday.  She uses wound care ointments and dressings but on no other medications for this.  Was advised by dermatologist after describing the wound today to come to ED for evaluation. Denies fevers, chills, myalgias, vomiting, weakness or numbness of the legs.    PCP [facility].  Dr Ashby Dawes.    Past Medical History  Diagnosis Date  . Hypertension   . Lower extremity ulceration ([place])   . Non-healing wound of lower extremity   . Cellulitis    Past Surgical History  Procedure Laterality Date  . Abdominal hysterectomy    . I&d extremity Right 08/23/2013    Procedure: I&D Right Leg Abscess;  Surgeon: Mcarthur Rossetti, MD;  Location: [place];  Service: Orthopedics;  Laterality: Right;  . Application of wound vac Right 08/23/2013    Procedure: APPLICATION OF WOUND VAC;  Surgeon: Mcarthur Rossetti, MD;  Location: [place];  Service: Orthopedics;  Laterality: Right;  . I&d extremity Right 08/27/2013    Procedure: REPEAT IRRIGATION AND DEBRIDEMENT OF RIGHT LEG WOUND;  Surgeon: Mcarthur Rossetti, MD;  Location: [place];  Service:  Orthopedics;  Laterality: Right;  . I&d extremity Right 08/30/2013    Procedure: Repeat IRRIGATION AND DEBRIDEMENT Right leg, skin graft;  Surgeon: Mcarthur Rossetti, MD;  Location: [place];  Service: Orthopedics;  Laterality: Right;   Family History  Problem Relation Age of Onset  . CAD Mother   . Hypertension Mother    Social History  Substance Use Topics  . Smoking status: Never Smoker   . Smokeless tobacco: Never Used  . Alcohol Use: No   OB History    No data available     Review of Systems  All other systems reviewed and are negative.     Allergies  Ancef; Primaxin; Heparin; and Vancomycin  Home Medications   Prior to Admission medications   Medication Sig Start Date End Date Taking? Authorizing Provider  amLODipine (NORVASC) 5 MG tablet Take 5 mg by mouth daily.   Yes Historical Provider, MD  clobetasol cream (TEMOVATE) [id number] % Apply 1 application topically daily. 01/08/16  Yes Historical Provider, MD  hydrochlorothiazide (HYDRODIURIL) 25 MG tablet Take 25 mg by mouth daily. 10/20/14  Yes Historical Provider, MD  Multiple Vitamins-Minerals (CENTRUM ADULTS PO) Take 1 tablet by mouth daily.   Yes Historical Provider, MD  potassium iodide (SSKI) 1 GM/ML solution Take by mouth 3 (three) times daily.   Yes Historical Provider, MD  acetaminophen (TYLENOL) 325 MG tablet Take 2 tablets (650 mg total) by mouth every 6 (six) hours as needed for mild pain or fever. 06/03/14   Delfina Redwood,  MD  amLODipine (NORVASC) 10 MG tablet Take 1 tablet (10 mg total) by mouth daily. 06/03/14   Delfina Redwood, MD  ciprofloxacin (CIPRO) 500 MG tablet Take 1 tablet (500 mg total) by mouth 2 (two) times daily. Patient not taking: Reported on 11/10/2014 06/03/14   Delfina Redwood, MD  doxycycline (VIBRA-TABS) 100 MG tablet Take 1 tablet (100 mg total) by mouth every 12 (twelve) hours. Patient not taking: Reported on 11/10/2014 06/03/14   Delfina Redwood, MD  HYDROcodone-acetaminophen  (NORCO/VICODIN) 5-325 MG per tablet Take 1 tablet by mouth every 6 (six) hours as needed. 06/03/14   Delfina Redwood, MD   BP 171/79 mmHg  Pulse 102  Temp(Src) 98.5 F (36.9 C) (Oral)  Resp 20  Ht 5\' 3"  (1.6 m)  Wt 97.977 kg  BMI 38.27 kg/m2  SpO2 98% Physical Exam  Constitutional: She appears well-developed and well-nourished. No distress.  HENT:  Head: Normocephalic and atraumatic.  Neck: Neck supple.  Cardiovascular: Normal rate and regular rhythm.   Pulmonary/Chest: Effort normal and breath sounds normal. No respiratory distress. She has no wheezes. She has no rales.  Abdominal: Soft. She exhibits no distension. There is no tenderness. There is no rebound and no guarding.  Musculoskeletal:  Bilateral lower extremities: moves toes easily, distal pulses and sensation intact.    Neurological: She is alert.  Skin: She is not diaphoretic.  Left proximal lateral lower leg with chronic appearing wound with thick yellow purulent discharge and surrounding skin with erythema, edema, tenderness to even light palpation.     Right distal anterior lower leg with chronic appearing wound.  No associated erythema or warmth.  Mildly tender to palpation.   Nursing note and vitals reviewed.        ED Course  Procedures (including critical care time) Labs Review Labs Reviewed  CBC WITH DIFFERENTIAL/PLATELET - Abnormal; Notable for the following:    WBC 16.2 (*)    Platelets 495 (*)    Neutro Abs 13.4 (*)    Monocytes Absolute 1.2 (*)    All other components within normal limits  BASIC METABOLIC PANEL - Abnormal; Notable for the following:    CO2 19 (*)    Glucose, Bld 102 (*)    Anion gap 16 (*)    All other components within normal limits  WOUND CULTURE  CULTURE, BLOOD (ROUTINE X 2)  CULTURE, BLOOD (ROUTINE X 2)  SEDIMENTATION RATE  TSH    Imaging Review Dg Tibia/fibula Left  01/28/2016  CLINICAL DATA:  Draining wound in lateral left calf EXAM: LEFT TIBIA AND FIBULA - 2  VIEW COMPARISON:  None. FINDINGS: No acute bony abnormality is noted. No fracture or dislocation is seen. No bony erosion is noted. Soft tissue swelling is noted laterally consistent with the given clinical history. IMPRESSION: Soft tissue inflammatory change without evidence of bony involvement. Electronically Signed   By: Inez Catalina M.D.   On: 01/28/2016 14:31   I have personally reviewed and evaluated these images and lab results as part of my medical decision-making.   EKG Interpretation None      MDM   Final diagnoses:  Cellulitis of left lower extremity  Wound infection (HCC)   Afebrile nontoxic patient with hx erythema nodosum with wound infection and associated cellulitis.  Clindamycin started in ED.  Wound culture collected prior to medication being given. Labs significant for leukocytosis.  Xray does not appear to indicate deeper infection such as osteomyelitis.  Admitted to [facility],  Leslye Peer, NP, accepting.      Clayton Bibles, PA-C 01/28/16 1521  Merrily Pew, MD 01/28/16 1550

## 2016-01-28 NOTE — ED Notes (Signed)
Pt in via [facility] c/o L & R leg wound, sent by dermatologist for eval d/t pain, hx of the same with need for sx & skin graft to L leg, wounds draining through tube guaze, pt hx of skin disorder, pt A&O x4

## 2016-01-29 LAB — BASIC METABOLIC PANEL
Anion gap: 16 — ABNORMAL HIGH (ref 5–15)
BUN: 19 mg/dL (ref 6–20)
CO2: 20 mmol/L — ABNORMAL LOW (ref 22–32)
Calcium: 9.2 mg/dL (ref 8.9–10.3)
Chloride: 103 mmol/L (ref 101–111)
Creatinine, Ser: 1.06 mg/dL — ABNORMAL HIGH (ref 0.44–1.00)
GFR calc Af Amer: >60 mL/min (ref >60)
GFR calc non Af Amer: 55 mL/min — ABNORMAL LOW (ref >60)
Glucose, Bld: 107 mg/dL — ABNORMAL HIGH (ref 65–99)
Potassium: 4.2 mmol/L (ref 3.5–5.1)
Sodium: 139 mmol/L (ref 135–145)

## 2016-01-29 LAB — CBC
HCT: 42.1 % (ref 36.0–46.0)
Hemoglobin: 13.9 g/dL (ref 12.0–15.0)
MCH: 28 pg (ref 26.0–34.0)
MCHC: 33 g/dL (ref 30.0–36.0)
MCV: 84.9 fL (ref 78.0–100.0)
Platelets: 418 10*3/uL — ABNORMAL HIGH (ref 150–400)
RBC: 4.96 MIL/uL (ref 3.87–5.11)
RDW: 14.4 % (ref 11.5–15.5)
WBC: 12.8 10*3/uL — ABNORMAL HIGH (ref 4.0–10.5)

## 2016-01-29 MED ORDER — VANCOMYCIN HCL 10 G IV SOLR
1250.0000 mg | Freq: Two times a day (BID) | INTRAVENOUS | Status: DC
  Administered 2016-01-29 – 2016-02-01 (×5): 1250 mg via INTRAVENOUS
  Filled 2016-01-29 (×7): qty 1250

## 2016-01-29 MED ORDER — MORPHINE SULFATE ER 30 MG PO TBCR
30.0000 mg | EXTENDED_RELEASE_TABLET | Freq: Two times a day (BID) | ORAL | Status: DC
Start: 2016-01-29 — End: 2016-02-05
  Administered 2016-01-29 – 2016-02-05 (×15): 30 mg via ORAL
  Filled 2016-01-29 (×15): qty 1

## 2016-01-29 MED ORDER — VANCOMYCIN HCL 10 G IV SOLR
2000.0000 mg | Freq: Once | INTRAVENOUS | Status: AC
  Administered 2016-01-29: 2000 mg via INTRAVENOUS
  Filled 2016-01-29: qty 2000

## 2016-01-29 MED ORDER — MEROPENEM 1 G IV SOLR
1.0000 g | Freq: Three times a day (TID) | INTRAVENOUS | Status: DC
  Administered 2016-01-29 – 2016-02-04 (×19): 1 g via INTRAVENOUS
  Filled 2016-01-29 (×20): qty 1

## 2016-01-29 MED ORDER — ONDANSETRON HCL 4 MG/2ML IJ SOLN: 4.0000 mg | Freq: Three times a day (TID) | INTRAMUSCULAR | Status: DC | PRN

## 2016-01-29 NOTE — Progress Notes (Signed)
Late entry for 01/28/16 @ 2205: Due to severe, excruciating pain as described by the pt with the slightest movement, pt is requesting an indwelling catheter be placed, as she says she cannot tolerate being on the bedpan. Notified NP on call and order received.  Blair Hailey, RN

## 2016-01-29 NOTE — Consult Note (Signed)
WOC wound consult note Reason for Consult: Lower extremity wounds Wound type: Unknown etiology, full thickness with necrotic tissue.  Patient followed for years by vascular, dermatology, and wound care center.  Patient reports 2 larger wounds developed a little over a week ago.  Newest area on left lateral lower thigh developed 3 days ago.  All areas started out as a "knot" that grew in size and opened up.  Patient has palpable dorsalis pedis pulses bilaterally.  Bilateral lower extremities with +1 edema. Measurement: Right lateral lower leg:  Approximately 6 x 10 cm.  Left lateral lower leg:  Unable to measure accurately due to pain, but approximately 7 cm in length. Left lateral lower thigh:  5 x 1 cm approximately. Wound bed:  Two larger wounds with scattered areas of necrotic tissue and no observable granulation tissue.  Induration present to observable area of left lateral lower leg wound.  Inspection of total area impaired and unable to be completed due to extreme pain. Drainage:  Thick serosanginous drainage noted to wound bed and pads beneath extremities.  Periwound: Superficial skin sloughing to all areas. Dressing procedure/placement/frequency:  Pending Plastic Surgery evaluation.    Discussed POC with Dr. Candiss Norse at bedside.  Discussed POC with patient and bedside nurse.  Re consult if needed, will not follow at this time. Thanks, Val Riles MSN, [employer]

## 2016-01-29 NOTE — Progress Notes (Signed)
PT Cancellation Note  Patient Details Name: Lindsay [name] MRN: [id number] DOB: 08-07-1951   Cancelled Treatment:    Reason Eval/Treat Not Completed: Pain limiting ability to participate. Pt about to get IV pain meds and therapist encouraged participation after, however pt continued to decline. Appears very anxious at the thought of moving her legs at this time. Will continue to follow and complete PT eval when able.     Rolinda Roan 01/29/2016, 10:34 AM   Rolinda Roan, PT, DPT Acute Rehabilitation Services Pager: [phone]

## 2016-01-29 NOTE — Progress Notes (Signed)
Pharmacy Antibiotic Note Lindsay [name] is a 65 y.o. female admitted on 01/28/2016 with chronic non healing lower extremity wounds that have previously grown MSSA and P.aerginosa. Pt has hx of rash to cephalosporin(s) that initially began while receiving Zosyn and vancomycin and persisted after vancomycin stopped and transitioned to a cephalosporin; however, has tolerated Invanz w/o issues other than concern for drug induced pancytopenia that was later attributed to lab error. Pharmacy has been consulted for Meropenem and Vancomycin dosing.   Plan: 1. Meropenem 1 gram IV every 8 hours 2. Vancomycin 2000 mg x 1 followed by 1250 mg Q 12 hours 3. If vancomycin ontinued will obtain level at [facility]; goal 10 -15 4. SCr Q72H while on vancomycin  5. Monitor for any sxs of adverse reaction to current abx 6. Await px micro data and adjust abx as needed  Height: 5\' 3"  (160 cm) Weight: 216 lb (97.977 kg) IBW/kg (Calculated) : 52.4  Temp (24hrs), Avg:98.8 F (37.1 C), Min:98.5 F (36.9 C), Max:99.4 F (37.4 C)   Recent Labs Lab 01/28/16 1300 01/29/16 0346  WBC 16.2* 12.8*  CREATININE 0.98 1.06*    Estimated Creatinine Clearance: 61.3 mL/min (by C-G formula based on Cr of 1.06).    Allergies  Allergen Reactions  . Ancef [Cefazolin] Rash  . Primaxin [Imipenem] Swelling    Developed lip swelling post administration of Primaxin even though she had tolerated this in the past.  . Heparin     Possible HIT (HIT antibody positive; SRA negative)  . Vancomycin Rash    Antimicrobials this admission: 5/16 Meropenem >>  5/16 Vancomycin  >>   Dose adjustments this admission: n/a  Microbiology results: 5/15 BCx: px 5/15 Wound: ngtd  Thank you for allowing pharmacy to be a part of this patient's care.  Vincenza Hews, PharmD, BCPS 01/29/2016, 11:03 AM Pager: [phone]

## 2016-01-29 NOTE — Progress Notes (Signed)
Late entry for 01/28/16 @ 2230: Pt is requesting pain medicine administration and time for it to "kick in" prior to attempting to insert urinary catheter. IV pain med given per prn order. Pt also needs drainage pads under legs changed, but refused at this time and again requested to wait until pain medicine had time to become effective. Will make oncoming 2300 shift aware of pt request.  Blair Hailey, RN

## 2016-01-29 NOTE — Progress Notes (Signed)
PROGRESS NOTE                                                                                                                                                                                                             Patient Demographics:    Lindsay [name], is a 65 y.o. female, DOB - 07/24/1951, [id number]  Admit date - 01/28/2016   Admitting Physician Waldemar Dickens, MD  Outpatient Primary MD for the patient is Merrilee Seashore, MD  LOS - 1  Outpatient Specialists   Chief Complaint  Patient presents with  . Wound Infection       Brief Narrative     Lindsay [name] is a 65 y.o. female with medical history significant for prior nonhealing wound of the left leg previously treated with medical therapy requiring hospitalization in 2015. Cultures were positive for MSSA and pseudomonas and she was treated with an extended course of Cipro and doxycycline. During the hospitalization she had allergic angioedema from Primaxin so this was discontinued. She also was HIT positive but SRA negative. Prior to this she also had a right lower extremity ulcer that required surgical I/D as well as STSG in 2014. Undergone prior arterial duplex studies as well as venous insufficiency studies that were normal. She has been evaluated by [facility] who felt the patient's chronic recurrent edema was related to lymphedema. She has done well with compressive therapy and has been compliant.  Patient reports that about a week and a half ago she developed a "knot" on the right lower leg and the right lateral thigh that has subsequently evolved into the ulcers that you see at present. Of note, this was similar to her presentations both in 2014 and 2015. She has been followed by her dermatologist since the evolution of the new lesions and has been using clobetasol cream without improvement in symptoms. After evaluation today in the office the dermatologist  [name]) sent her over for further evaluation and treatment in the ER.   Subjective:    [name] today has, No headache, No chest pain, No abdominal pain - No Nausea, No new weakness tingling or numbness, No Cough - SOB. Pain in both lower extremities   Assessment  & Plan :     1.Venous stasis ulcer in both lower extremities pictures below with mild surrounding cellulitis. For now antibiotics have  been adjusted to IV vancomycin and Zosyn, pharmacy to dose, monitor cultures. Wound care consulted. Biopsy results from September 2016 noted consistent with venous stasis ulcers. Have consulted plastic surgery as well. Outpatient vascular surgery follow-up.  2. History of HIT. For now on Arixtra for DVT prophylaxis.  3. Essential hypertension. On Norvasc.  4. History of hypothyroidism. Stable  5.  Lab Results  Component Value Date   TSH 4.883* 01/28/2016     Code Status : Full  Family Communication  : None present  Disposition Plan  : Home 2-3 days  Barriers For Discharge : Treatment of lower extremity wounds  Consults  :  Plastic surgery, wound care  Procedures  :    DVT Prophylaxis  :  arixtra  Lab Results  Component Value Date   PLT 418* 01/29/2016    Antibiotics  :   Anti-infectives    Start     Dose/Rate Route Frequency Ordered Stop   01/28/16 2100  doxycycline (VIBRAMYCIN) 100 mg in dextrose 5 % 250 mL IVPB  Status:  Discontinued     100 mg 125 mL/hr over 120 Minutes Intravenous Every 12 hours 01/28/16 1432 01/28/16 1502   01/28/16 1515  doxycycline (VIBRAMYCIN) 100 mg in dextrose 5 % 250 mL IVPB  Status:  Discontinued     100 mg 125 mL/hr over 120 Minutes Intravenous Every 12 hours 01/28/16 1502 01/29/16 1033   01/28/16 1445  doxycycline (VIBRA-TABS) tablet 100 mg  Status:  Discontinued     100 mg Oral Every 12 hours 01/28/16 1432 01/28/16 1432   01/28/16 1445  ciprofloxacin (CIPRO) IVPB 400 mg  Status:  Discontinued     400 mg 200 mL/hr over 60  Minutes Intravenous Every 12 hours 01/28/16 1432 01/29/16 1033   01/28/16 1300  clindamycin (CLEOCIN) IVPB 600 mg     600 mg 100 mL/hr over 30 Minutes Intravenous  Once 01/28/16 1256 01/28/16 1337        Objective:   Filed Vitals:   01/28/16 1530 01/28/16 1545 01/28/16 1701 01/29/16 0421  BP: 154/79 138/77 143/52 161/76  Pulse: 93 95 93 91  Temp:   99.4 F (37.4 C) 98.5 F (36.9 C)  TempSrc:   Oral Oral  Resp: 18 24 18 17   Height:   5\' 3"  (1.6 m)   Weight:   97.977 kg (216 lb)   SpO2: 93% 97% 94% 98%    Wt Readings from Last 3 Encounters:  01/28/16 97.977 kg (216 lb)  11/10/14 98.884 kg (218 lb)  06/02/14 100.699 kg (222 lb)     Intake/Output Summary (Last 24 hours) at 01/29/16 1038 Last data filed at 01/29/16 0518  Gross per 24 hour  Intake    240 ml  Output    350 ml  Net   -110 ml     Physical Exam  Awake Alert, Oriented X 3, No new F.N deficits, Normal affect [place].AT,PERRAL Supple Neck,No JVD, No cervical lymphadenopathy appriciated.  Symmetrical Chest wall movement, Good air movement bilaterally, CTAB RRR,No Gallops,Rubs or new Murmurs, No Parasternal Heave +ve B.Sounds, Abd Soft, No tenderness, No organomegaly appriciated, No rebound - guarding or rigidity. No Cyanosis, Clubbing or edema, No new Rash or bruise      DORSUM RIGHT FOOT AT ANKLE   LEFT LATERAL THIGH    Data Review:    CBC  Recent Labs Lab 01/28/16 1300 01/29/16 0346  WBC 16.2* 12.8*  HGB 13.7 13.9  HCT 42.1 42.1  PLT 495* 418*  MCV 83.7 84.9  MCH 27.2 28.0  MCHC 32.5 33.0  RDW 14.2 14.4  LYMPHSABS 1.6  --   MONOABS 1.2*  --   EOSABS 0.0  --   BASOSABS 0.0  --     Chemistries   Recent Labs Lab 01/28/16 1300 01/29/16 0346  NA 139 139  K 3.5 4.2  CL 104 103  CO2 19* 20*  GLUCOSE 102* 107*  BUN 14 19  CREATININE 0.98 1.06*  CALCIUM 9.2 9.2   ------------------------------------------------------------------------------------------------------------------ No  results for input(s): CHOL, HDL, LDLCALC, TRIG, CHOLHDL, LDLDIRECT in the last 72 hours.  No results found for: HGBA1C ------------------------------------------------------------------------------------------------------------------  Recent Labs  01/28/16 1544  TSH 4.883*   ------------------------------------------------------------------------------------------------------------------ No results for input(s): VITAMINB12, FOLATE, FERRITIN, TIBC, IRON, RETICCTPCT in the last 72 hours.  Coagulation profile No results for input(s): INR, PROTIME in the last 168 hours.  No results for input(s): DDIMER in the last 72 hours.  Cardiac Enzymes No results for input(s): CKMB, TROPONINI, MYOGLOBIN in the last 168 hours.  Invalid input(s): CK ------------------------------------------------------------------------------------------------------------------ No results found for: BNP  Inpatient Medications  Scheduled Meds: . amLODipine  5 mg Oral Daily  . clobetasol cream  1 application Topical Daily  . fondaparinux (ARIXTRA) injection  2.5 mg Subcutaneous Q0600  . potassium iodide  500 mg Oral TID   Continuous Infusions:  PRN Meds:.acetaminophen, morphine injection, ondansetron (ZOFRAN) IV, oxyCODONE  Micro Results No results found for this or any previous visit (from the past 240 hour(s)).  Radiology Reports Dg Tibia/fibula Left  01/28/2016  CLINICAL DATA:  Draining wound in lateral left calf EXAM: LEFT TIBIA AND FIBULA - 2 VIEW COMPARISON:  None. FINDINGS: No acute bony abnormality is noted. No fracture or dislocation is seen. No bony erosion is noted. Soft tissue swelling is noted laterally consistent with the given clinical history. IMPRESSION: Soft tissue inflammatory change without evidence of bony involvement. Electronically Signed   By: Inez Catalina M.D.   On: 01/28/2016 14:31    Time Spent in minutes  30   Spencer Cardinal K M.D on 01/29/2016 at 10:38 AM  Between 7am to 7pm  - Pager - [phone]  After 7pm go to [email or web address] - password [facility]  [facility] -  Office  [phone]

## 2016-01-30 ENCOUNTER — Encounter (HOSPITAL_COMMUNITY): Payer: Self-pay | Admitting: Plastic Surgery

## 2016-01-30 LAB — PROTIME-INR
INR: 1.07 (ref 0.00–1.49)
Prothrombin Time: 14.1 seconds (ref 11.6–15.2)

## 2016-01-30 LAB — CBC
HCT: 38.7 % (ref 36.0–46.0)
Hemoglobin: 12 g/dL (ref 12.0–15.0)
MCH: 26.6 pg (ref 26.0–34.0)
MCHC: 31 g/dL (ref 30.0–36.0)
MCV: 85.8 fL (ref 78.0–100.0)
Platelets: 497 10*3/uL — ABNORMAL HIGH (ref 150–400)
RBC: 4.51 MIL/uL (ref 3.87–5.11)
RDW: 14.5 % (ref 11.5–15.5)
WBC: 13 10*3/uL — ABNORMAL HIGH (ref 4.0–10.5)

## 2016-01-30 LAB — BASIC METABOLIC PANEL
Anion gap: 13 (ref 5–15)
BUN: 19 mg/dL (ref 6–20)
CO2: 22 mmol/L (ref 22–32)
Calcium: 9 mg/dL (ref 8.9–10.3)
Chloride: 104 mmol/L (ref 101–111)
Creatinine, Ser: 0.89 mg/dL (ref 0.44–1.00)
GFR calc Af Amer: >60 mL/min (ref >60)
GFR calc non Af Amer: >60 mL/min (ref >60)
Glucose, Bld: 97 mg/dL (ref 65–99)
Potassium: 4.2 mmol/L (ref 3.5–5.1)
Sodium: 139 mmol/L (ref 135–145)

## 2016-01-30 LAB — PREALBUMIN: Prealbumin: 12.8 mg/dL — ABNORMAL LOW (ref 18–38)

## 2016-01-30 NOTE — Consult Note (Signed)
[facility] NOTE   MRN : [id number]  Reason for Consult: Bilateral leg ulcers Referring Physician: Dr. Candiss Norse  History of Present Illness: 65 y/o female who is know to our office with a history of chronic leg ulcers.  She was last seen by Dr. Trula Slade on 11/10/2014.  At that time she had wounds That were almost healed.  She has been evaluated and treated at the wound center with compression and a silver ointment.  She has also done well in the past with una boot placement.  The patient has a history of a right leg ulcer approximately 2 years ago. This required incision and drainage as well as a split thickness skin graft in December 2014. At that time she had arterial Doppler studies in February 2015 which were normal. The patient denies any history of trauma. She does wear her compression which are knee high.  She has been seeing a dermatologist for support and preventative therapy using a skin cream called Clobetasol.        These new leg ulcers started one week ago from today.  She reports no fever, chills, SOB or chest pain.  Her past medical history is positive for hypertension managed with Norvasc.       Current Facility-Administered Medications  Medication Dose Route Frequency Provider Last Rate Last Dose  . acetaminophen (TYLENOL) tablet 650 mg  650 mg Oral Q4H PRN Samella Parr, NP      . amLODipine (NORVASC) tablet 5 mg  5 mg Oral Daily Samella Parr, NP   5 mg at 01/30/16 0914  . clobetasol cream (TEMOVATE) [id number] % 1 application  1 application Topical Daily Samella Parr, NP   1 application at [id number]  . fondaparinux (ARIXTRA) injection 2.5 mg  2.5 mg Subcutaneous Q0600 Samella Parr, NP   2.5 mg at 01/30/16 0603  . meropenem (MERREM) 1 g in sodium chloride 0.9 % 100 mL IVPB  1 g Intravenous Q8H Thurnell Lose, MD   1 g at 01/30/16 0558  . morphine (MS CONTIN) 12 hr tablet 30 mg  30 mg Oral Q12H Thurnell Lose, MD   30 mg at  01/30/16 0914  . morphine 2 MG/ML injection 1-4 mg  1-4 mg Intravenous Q2H PRN Samella Parr, NP   2 mg at 01/29/16 1036  . ondansetron (ZOFRAN) injection 4 mg  4 mg Intravenous Q8H PRN Thurnell Lose, MD      . oxyCODONE (Oxy IR/ROXICODONE) immediate release tablet 5-10 mg  5-10 mg Oral Q4H PRN Samella Parr, NP   10 mg at 01/30/16 0606  . potassium iodide (SSKI) 1 GM/ML solution 500 mg  500 mg Oral TID Samella Parr, NP   500 mg at 01/30/16 1106  . vancomycin (VANCOCIN) 1,250 mg in sodium chloride 0.9 % 250 mL IVPB  1,250 mg Intravenous Q12H Thurnell Lose, MD   1,250 mg at 01/30/16 1105    Pt meds include: Statin :No Betablocker: No ASA: No Other anticoagulants/antiplatelets:   Past Medical History  Diagnosis Date  . Hypertension   . Lower extremity ulceration ([place])   . Non-healing wound of lower extremity   . Cellulitis   . Lymphedema     Past Surgical History  Procedure Laterality Date  . Abdominal hysterectomy    . I&d extremity Right 08/23/2013    Procedure: I&D Right Leg Abscess;  Surgeon: Mcarthur Rossetti, MD;  Location: [place];  Service: Orthopedics;  Laterality: Right;  . Application of wound vac Right 08/23/2013    Procedure: APPLICATION OF WOUND VAC;  Surgeon: Mcarthur Rossetti, MD;  Location: [place];  Service: Orthopedics;  Laterality: Right;  . I&d extremity Right 08/27/2013    Procedure: REPEAT IRRIGATION AND DEBRIDEMENT OF RIGHT LEG WOUND;  Surgeon: Mcarthur Rossetti, MD;  Location: [place];  Service: Orthopedics;  Laterality: Right;  . I&d extremity Right 08/30/2013    Procedure: Repeat IRRIGATION AND DEBRIDEMENT Right leg, skin graft;  Surgeon: Mcarthur Rossetti, MD;  Location: [place];  Service: Orthopedics;  Laterality: Right;    Social History Social History  Substance Use Topics  . Smoking status: Never Smoker   . Smokeless tobacco: Never Used  . Alcohol Use: No    Family History Family History  Problem Relation Age of Onset   . CAD Mother   . Hypertension Mother     Allergies  Allergen Reactions  . Ancef [Cefazolin] Rash  . Primaxin [Imipenem] Swelling    Developed lip swelling post administration of Primaxin even though she had tolerated this in the past.  . Heparin     Possible HIT (HIT antibody positive; SRA negative)  . Vancomycin Rash     REVIEW OF SYSTEMS  General: [ ]  Weight loss, [ ]  Fever, [ ]  chills Neurologic: [ ]  Dizziness, [ ]  Blackouts, [ ]  Seizure [ ]  Stroke, [ ]  "Mini stroke", [ ]  Slurred speech, [ ]  Temporary blindness; [ ]  weakness in arms or legs, [ ]  Hoarseness [ ]  Dysphagia Cardiac: [ ]  Chest pain/pressure, [ ]  Shortness of breath at rest [ ]  Shortness of breath with exertion, [ ]  Atrial fibrillation or irregular heartbeat  Vascular: [ ]  Pain in legs with walking, [ ]  Pain in legs at rest, [ ]  Pain in legs at night,  [x ] Non-healing ulcer, [ ]  Blood clot in vein/DVT,   Pulmonary: [ ]  Home oxygen, [ ]  Productive cough, [ ]  Coughing up blood, [ ]  Asthma,  [ ]  Wheezing [ ]  COPD Musculoskeletal:  [ ]  Arthritis, [ ]  Low back pain, [ ]  Joint pain Hematologic: [ ]  Easy Bruising, [ ]  Anemia; [ ]  Hepatitis Gastrointestinal: [ ]  Blood in stool, [ ]  Gastroesophageal Reflux/heartburn, Urinary: [ ]  chronic Kidney disease, [ ]  on HD - [ ]  MWF or [ ]  TTHS, [ ]  Burning with urination, [ ]  Difficulty urinating Skin: [ ]  Rashes, [x ] Wounds Psychological: [ ]  Anxiety, [ ]  Depression  Physical Examination Filed Vitals:   01/29/16 0421 01/29/16 1600 01/29/16 2058 01/30/16 0500  BP: 161/76 139/92 158/73 113/60  Pulse: 91 95 97 81  Temp: 98.5 F (36.9 C) 98.4 F (36.9 C) 98.7 F (37.1 C) 97.7 F (36.5 C)  TempSrc: Oral  Oral   Resp: 17 16 16 16   Height:      Weight:      SpO2: 98% 98% 97% 96%   Body mass index is 38.27 kg/(m^2).  General:  WDWN in NAD HENT: WNL Eyes: Pupils equal Pulmonary: normal non-labored breathing , without Rales, rhonchi,  wheezing Cardiac: RRR, without   Murmurs, rubs or gallops; No carotid bruits Abdomen: soft, NT, no masses Skin: Large open ulcer left lateral knee . Small ulcer above the knee, right ankle ulcer.  Beefy red areas with circumferential blisters.    Vascular Exam/Pulses:Palpable radial, femoral, DP/PT bilaterally   Musculoskeletal: no muscle wasting or atrophy; positive LE edema  Neurologic: A&O X 3;  Appropriate Affect ;  SENSATION: normal; MOTOR FUNCTION: 5/5 Symmetric Speech is fluent/normal   Significant Diagnostic Studies: CBC Lab Results  Component Value Date   WBC 13.0* 01/30/2016   HGB 12.0 01/30/2016   HCT 38.7 01/30/2016   MCV 85.8 01/30/2016   PLT 497* 01/30/2016    BMET    Component Value Date/Time   NA 139 01/30/2016 0657   K 4.2 01/30/2016 0657   CL 104 01/30/2016 0657   CO2 22 01/30/2016 0657   GLUCOSE 97 01/30/2016 0657   BUN 19 01/30/2016 0657   CREATININE 0.89 01/30/2016 0657   CALCIUM 9.0 01/30/2016 0657   GFRNONAA >60 01/30/2016 0657   GFRAA >60 01/30/2016 0657   Estimated Creatinine Clearance: 73 mL/min (by C-G formula based on Cr of 0.89).  COAG No results found for: INR, PROTIME     ASSESSMENT/PLAN:  Suspected lymphedema Chronic ulcers which come and go  She may benefit from [place] boot wraps bilaterally.  I elevated her feet by putting the bed in a trendelenburg position.   She has had previous venous insufficiency workup was normal.  Her arterial blood flow is normal with distal palpable pulses.   I will discuss the plan of care with Dr. Kellie Simmering who is our on call doctor today.   Her WBC in elevate and she is on IV antibiotics.   Laurence Slate [facility] 01/30/2016 11:47 AM  Patient has very unusual situation There is no evidence of arterial insufficiency or venous insufficiency on multiple studies in the past She developed abscess right lateral ankle and left lateral knee area about 10 days ago. She has had similar situations in the past. Also now has an area in her left  proximal thigh which she states is the way these other lesions started She has been evaluated by dermatology Not certain when in the past she has been seen by infectious disease  This does not appear to be a vascular situation but some type of migratory abscess syndrome Would recommend infectious disease consult if not R any done and appropriate antibiotics  There will be no role and arterial or venous procedures in this patient We'll see again at your request

## 2016-01-30 NOTE — Consult Note (Signed)
Reason for Consult: bilateral lower leg wounds Referring Physician: Dr. Rande Lawman Weiss is an 65 y.o. female.  HPI: The patient is a 65 yrs old bf here for treatment of her lower extremities.  She has been dealing with wounds on and off for the past several years.  She was treated at the wound center with Unna boots and compression with success.  She then developed new wounds on the right lower lateral leg and left anterior proximal lower leg ~ 2 weeks ago.  She complains of severe pain and won't allow dressings or dressing changes.  She is putting a pad under the area at this time.  She has a new tender, swollen and red area on the lateral thigh area.  It appears like the start of an abscess.  She has known multiple medical problems as listed below.  The wound are ~ 6 x 10 cm in size and have a blister abscess appearance to the rim with an ulceration at the center.  I agree it has the appearance of a PG or abscess.  Past Medical History  Diagnosis Date  . Hypertension   . Lower extremity ulceration ([place])   . Non-healing wound of lower extremity   . Cellulitis   . Lymphedema     Past Surgical History  Procedure Laterality Date  . Abdominal hysterectomy    . I&d extremity Right 08/23/2013    Procedure: I&D Right Leg Abscess;  Surgeon: Mcarthur Rossetti, MD;  Location: [facility];  Service: Orthopedics;  Laterality: Right;  . Application of wound vac Right 08/23/2013    Procedure: APPLICATION OF WOUND VAC;  Surgeon: Mcarthur Rossetti, MD;  Location: [facility];  Service: Orthopedics;  Laterality: Right;  . I&d extremity Right 08/27/2013    Procedure: REPEAT IRRIGATION AND DEBRIDEMENT OF RIGHT LEG WOUND;  Surgeon: Mcarthur Rossetti, MD;  Location: [facility];  Service: Orthopedics;  Laterality: Right;  . I&d extremity Right 08/30/2013    Procedure: Repeat IRRIGATION AND DEBRIDEMENT Right leg, skin graft;  Surgeon: Mcarthur Rossetti, MD;  Location: [facility];  Service: Orthopedics;   Laterality: Right;    Family History  Problem Relation Age of Onset  . CAD Mother   . Hypertension Mother     Social History:  reports that she has never smoked. She has never used smokeless tobacco. She reports that she does not drink alcohol or use illicit drugs.  Allergies:  Allergies  Allergen Reactions  . Ancef [Cefazolin] Rash  . Primaxin [Imipenem] Swelling    Developed lip swelling post administration of Primaxin even though she had tolerated this in the past.  . Heparin     Possible HIT (HIT antibody positive; SRA negative)  . Vancomycin Rash    Medications: I have reviewed the patient's current medications.  Results for orders placed or performed during the hospital encounter of 01/28/16 (from the past 48 hour(s))  Basic metabolic panel     Status: Abnormal   Collection Time: 01/29/16  3:46 AM  Result Value Ref Range   Sodium 139 135 - 145 mmol/L   Potassium 4.2 3.5 - 5.1 mmol/L   Chloride 103 101 - 111 mmol/L   CO2 20 (L) 22 - 32 mmol/L   Glucose, Bld 107 (H) 65 - 99 mg/dL   BUN 19 6 - 20 mg/dL   Creatinine, Ser 1.06 (H) 0.44 - 1.00 mg/dL   Calcium 9.2 8.9 - 10.3 mg/dL   GFR calc non Af Wyvonnia Lora  55 (L) >60 mL/min   GFR calc Af Amer >60 >60 mL/min    Comment: (NOTE) The eGFR has been calculated using the CKD EPI equation. This calculation has not been validated in all clinical situations. eGFR's persistently <60 mL/min signify possible Chronic Kidney Disease.    Anion gap 16 (H) 5 - 15  CBC     Status: Abnormal   Collection Time: 01/29/16  3:46 AM  Result Value Ref Range   WBC 12.8 (H) 4.0 - 10.5 K/uL   RBC 4.96 3.87 - 5.11 MIL/uL   Hemoglobin 13.9 12.0 - 15.0 g/dL   HCT 42.1 36.0 - 46.0 %   MCV 84.9 78.0 - 100.0 fL   MCH 28.0 26.0 - 34.0 pg   MCHC 33.0 30.0 - 36.0 g/dL   RDW 14.4 11.5 - 15.5 %   Platelets 418 (H) 150 - 400 K/uL  CBC     Status: Abnormal   Collection Time: 01/30/16  6:57 AM  Result Value Ref Range   WBC 13.0 (H) 4.0 - 10.5 K/uL    RBC 4.51 3.87 - 5.11 MIL/uL   Hemoglobin 12.0 12.0 - 15.0 g/dL   HCT 38.7 36.0 - 46.0 %   MCV 85.8 78.0 - 100.0 fL   MCH 26.6 26.0 - 34.0 pg   MCHC 31.0 30.0 - 36.0 g/dL   RDW 14.5 11.5 - 15.5 %   Platelets 497 (H) 150 - 400 K/uL  Basic metabolic panel     Status: None   Collection Time: 01/30/16  6:57 AM  Result Value Ref Range   Sodium 139 135 - 145 mmol/L   Potassium 4.2 3.5 - 5.1 mmol/L   Chloride 104 101 - 111 mmol/L   CO2 22 22 - 32 mmol/L   Glucose, Bld 97 65 - 99 mg/dL   BUN 19 6 - 20 mg/dL   Creatinine, Ser 0.89 0.44 - 1.00 mg/dL   Calcium 9.0 8.9 - 10.3 mg/dL   GFR calc non Af Amer >60 >60 mL/min   GFR calc Af Amer >60 >60 mL/min    Comment: (NOTE) The eGFR has been calculated using the CKD EPI equation. This calculation has not been validated in all clinical situations. eGFR's persistently <60 mL/min signify possible Chronic Kidney Disease.    Anion gap 13 5 - 15    No results found.  Review of Systems  Constitutional: Negative.   HENT: Negative.   Eyes: Negative.   Respiratory: Negative.   Cardiovascular: Negative.   Gastrointestinal: Negative.   Genitourinary: Negative.   Musculoskeletal: Negative.   Skin: Negative.   Psychiatric/Behavioral: Negative.    Blood pressure 146/76, pulse 93, temperature 98.6 F (37 C), temperature source Oral, resp. rate 18, height '5\' 3"'  (1.6 m), weight 97.977 kg (216 lb), SpO2 97 %. Physical Exam  Constitutional: She is oriented to person, place, and time. She appears well-developed and well-nourished.  HENT:  Head: Normocephalic and atraumatic.  Eyes: EOM are normal. Pupils are equal, round, and reactive to light.  Cardiovascular: Normal rate.   Respiratory: Effort normal. No respiratory distress.  Musculoskeletal:       Legs: Neurological: She is alert and oriented to person, place, and time.  Skin: Skin is warm.  Psychiatric: She has a normal mood and affect. Her behavior is normal. Thought content normal.     Assessment/Plan: Recommend elevation of legs, check prealbumin, multivitamin daily, Vitamin C 500 mg twice daily, Zinc 220 mg twice daily.  ID consult would be very  helpful.  Surgery is not much of an option at this time as the patient won't allow dressing changes which would be a definate part of process.  I also have concerns about making it worse.  Recommend close observation.  Call if it progresses.  Wallace Going 01/30/2016, 4:22 PM

## 2016-01-30 NOTE — Progress Notes (Signed)
PROGRESS NOTE                                                                                                                                                                                                             Patient Demographics:    Lindsay [name], is a 65 y.o. female, DOB - December 25, 1950, [id number]  Admit date - 01/28/2016   Admitting Physician Waldemar Dickens, MD  Outpatient Primary MD for the patient is Merrilee Seashore, MD  LOS - 2  Outpatient Specialists   Chief Complaint  Patient presents with  . Wound Infection       Brief Narrative     Lindsay [name] is a 65 y.o. female with medical history significant for prior nonhealing wound of the left leg previously treated with medical therapy requiring hospitalization in 2015. Cultures were positive for MSSA and pseudomonas and she was treated with an extended course of Cipro and doxycycline. During the hospitalization she had allergic angioedema from Primaxin so this was discontinued. She also was HIT positive but SRA negative. Prior to this she also had a right lower extremity ulcer that required surgical I/D as well as STSG in 2014. Undergone prior arterial duplex studies as well as venous insufficiency studies that were normal. She has been evaluated by [facility] who felt the patient's chronic recurrent edema was related to lymphedema. She has done well with compressive therapy and has been compliant.  Patient reports that about a week and a half ago she developed a "knot" on the right lower leg and the right lateral thigh that has subsequently evolved into the ulcers that you see at present. Of note, this was similar to her presentations both in 2014 and 2015. She has been followed by her dermatologist since the evolution of the new lesions and has been using clobetasol cream without improvement in symptoms. After evaluation today in the office the dermatologist  Lindsay [name]) sent her over for further evaluation and treatment in the ER.   Subjective:    Lindsay [name] today has, No headache, No chest pain, No abdominal pain - No Nausea, No new weakness tingling or numbness, No Cough - SOB. Pain in both lower extremities   Assessment  & Plan :     1.Venous stasis ulcer in both lower extremities pictures below with mild surrounding cellulitis. For now antibiotics have  been adjusted to IV vancomycin and Zosyn, pharmacy to dose, monitor cultures. Wound care consulted. Biopsy results from September 2016 noted consistent with venous stasis ulcers. Have consulted plastic surgery and vascular surgery as well. We will await their input, after discharge continue follow-up with outpatient wound care.  2. History of HIT. For now on Arixtra for DVT prophylaxis.  3. Essential hypertension. On Norvasc.  4. History of hypothyroidism. Stable   Lab Results  Component Value Date   TSH 4.883* 01/28/2016     Code Status : Full  Family Communication  : None present  Disposition Plan  : Home 2-3 days  Barriers For Discharge : Treatment of lower extremity wounds  Consults  :  Plastic surgery, wound care, Vas Surgery  Procedures  :    DVT Prophylaxis  :  arixtra  Lab Results  Component Value Date   PLT 497* 01/30/2016    Antibiotics  :   Anti-infectives    Start     Dose/Rate Route Frequency Ordered Stop   01/29/16 2330  vancomycin (VANCOCIN) 1,250 mg in sodium chloride 0.9 % 250 mL IVPB     1,250 mg 166.7 mL/hr over 90 Minutes Intravenous Every 12 hours 01/29/16 1112     01/29/16 1400  meropenem (MERREM) 1 g in sodium chloride 0.9 % 100 mL IVPB     1 g 200 mL/hr over 30 Minutes Intravenous Every 8 hours 01/29/16 1058     01/29/16 1100  vancomycin (VANCOCIN) 2,000 mg in sodium chloride 0.9 % 500 mL IVPB     2,000 mg 250 mL/hr over 120 Minutes Intravenous  Once 01/29/16 1058 01/29/16 1455   01/28/16 2100  doxycycline (VIBRAMYCIN) 100 mg in  dextrose 5 % 250 mL IVPB  Status:  Discontinued     100 mg 125 mL/hr over 120 Minutes Intravenous Every 12 hours 01/28/16 1432 01/28/16 1502   01/28/16 1515  doxycycline (VIBRAMYCIN) 100 mg in dextrose 5 % 250 mL IVPB  Status:  Discontinued     100 mg 125 mL/hr over 120 Minutes Intravenous Every 12 hours 01/28/16 1502 01/29/16 1033   01/28/16 1445  doxycycline (VIBRA-TABS) tablet 100 mg  Status:  Discontinued     100 mg Oral Every 12 hours 01/28/16 1432 01/28/16 1432   01/28/16 1445  ciprofloxacin (CIPRO) IVPB 400 mg  Status:  Discontinued     400 mg 200 mL/hr over 60 Minutes Intravenous Every 12 hours 01/28/16 1432 01/29/16 1033   01/28/16 1300  clindamycin (CLEOCIN) IVPB 600 mg     600 mg 100 mL/hr over 30 Minutes Intravenous  Once 01/28/16 1256 01/28/16 1337        Objective:   Filed Vitals:   01/29/16 0421 01/29/16 1600 01/29/16 2058 01/30/16 0500  BP: 161/76 139/92 158/73 113/60  Pulse: 91 95 97 81  Temp: 98.5 F (36.9 C) 98.4 F (36.9 C) 98.7 F (37.1 C) 97.7 F (36.5 C)  TempSrc: Oral  Oral   Resp: 17 16 16 16   Height:      Weight:      SpO2: 98% 98% 97% 96%    Wt Readings from Last 3 Encounters:  01/28/16 97.977 kg (216 lb)  11/10/14 98.884 kg (218 lb)  06/02/14 100.699 kg (222 lb)     Intake/Output Summary (Last 24 hours) at 01/30/16 1158 Last data filed at 01/30/16 0700  Gross per 24 hour  Intake    720 ml  Output    900 ml  Net   -  180 ml     Physical Exam  Awake Alert, Oriented X 3, No new F.N deficits, Normal affect [place].AT,PERRAL Supple Neck,No JVD, No cervical lymphadenopathy appriciated.  Symmetrical Chest wall movement, Good air movement bilaterally, CTAB RRR,No Gallops,Rubs or new Murmurs, No Parasternal Heave +ve B.Sounds, Abd Soft, No tenderness, No organomegaly appriciated, No rebound - guarding or rigidity. No Cyanosis, Clubbing or edema, No new Rash or bruise      DORSUM RIGHT FOOT AT ANKLE   LEFT LATERAL THIGH    Data Review:      CBC  Recent Labs Lab 01/28/16 1300 01/29/16 0346 01/30/16 0657  WBC 16.2* 12.8* 13.0*  HGB 13.7 13.9 12.0  HCT 42.1 42.1 38.7  PLT 495* 418* 497*  MCV 83.7 84.9 85.8  MCH 27.2 28.0 26.6  MCHC 32.5 33.0 31.0  RDW 14.2 14.4 14.5  LYMPHSABS 1.6  --   --   MONOABS 1.2*  --   --   EOSABS 0.0  --   --   BASOSABS 0.0  --   --     Chemistries   Recent Labs Lab 01/28/16 1300 01/29/16 0346 01/30/16 0657  NA 139 139 139  K 3.5 4.2 4.2  CL 104 103 104  CO2 19* 20* 22  GLUCOSE 102* 107* 97  BUN 14 19 19   CREATININE 0.98 1.06* 0.89  CALCIUM 9.2 9.2 9.0   ------------------------------------------------------------------------------------------------------------------ No results for input(s): CHOL, HDL, LDLCALC, TRIG, CHOLHDL, LDLDIRECT in the last 72 hours.  No results found for: HGBA1C ------------------------------------------------------------------------------------------------------------------  Recent Labs  01/28/16 1544  TSH 4.883*   ------------------------------------------------------------------------------------------------------------------ No results for input(s): VITAMINB12, FOLATE, FERRITIN, TIBC, IRON, RETICCTPCT in the last 72 hours.  Coagulation profile No results for input(s): INR, PROTIME in the last 168 hours.  No results for input(s): DDIMER in the last 72 hours.  Cardiac Enzymes No results for input(s): CKMB, TROPONINI, MYOGLOBIN in the last 168 hours.  Invalid input(s): CK ------------------------------------------------------------------------------------------------------------------ No results found for: BNP  Inpatient Medications  Scheduled Meds: . amLODipine  5 mg Oral Daily  . clobetasol cream  1 application Topical Daily  . fondaparinux (ARIXTRA) injection  2.5 mg Subcutaneous Q0600  . meropenem (MERREM) IV  1 g Intravenous Q8H  . morphine  30 mg Oral Q12H  . potassium iodide  500 mg Oral TID  . vancomycin  1,250 mg  Intravenous Q12H   Continuous Infusions:  PRN Meds:.acetaminophen, morphine injection, ondansetron (ZOFRAN) IV, oxyCODONE  Micro Results Recent Results (from the past 240 hour(s))  Wound culture     Status: None (Preliminary result)   Collection Time: 01/28/16 12:10 PM  Result Value Ref Range Status   Specimen Description WOUND LEFT LEG  Final   Special Requests Normal  Final   Gram Stain   Final    FEW WBC PRESENT, PREDOMINANTLY MONONUCLEAR NO SQUAMOUS EPITHELIAL CELLS SEEN NO ORGANISMS SEEN Performed at [facility]    Culture   Final    NO GROWTH 1 DAY Performed at [facility]    Report Status PENDING  Incomplete  Culture, blood (Routine X 2) w Reflex to ID Panel     Status: None (Preliminary result)   Collection Time: 01/28/16 12:15 PM  Result Value Ref Range Status   Specimen Description BLOOD RIGHT ANTECUBITAL  Final   Special Requests BOTTLES DRAWN AEROBIC AND ANAEROBIC 5CC  Final   Culture NO GROWTH < 24 HOURS  Final   Report Status PENDING  Incomplete  Culture, blood (Routine  X 2) w Reflex to ID Panel     Status: None (Preliminary result)   Collection Time: 01/28/16  3:15 PM  Result Value Ref Range Status   Specimen Description BLOOD RIGHT ANTECUBITAL  Final   Special Requests BOTTLES DRAWN AEROBIC AND ANAEROBIC 5CC  Final   Culture NO GROWTH < 24 HOURS  Final   Report Status PENDING  Incomplete    Radiology Reports Dg Tibia/fibula Left  01/28/2016  CLINICAL DATA:  Draining wound in lateral left calf EXAM: LEFT TIBIA AND FIBULA - 2 VIEW COMPARISON:  None. FINDINGS: No acute bony abnormality is noted. No fracture or dislocation is seen. No bony erosion is noted. Soft tissue swelling is noted laterally consistent with the given clinical history. IMPRESSION: Soft tissue inflammatory change without evidence of bony involvement. Electronically Signed   By: Inez Catalina M.D.   On: 01/28/2016 14:31    Time Spent in minutes  30   Kymber Kosar K M.D  on 01/30/2016 at 11:58 AM  Between 7am to 7pm - Pager - [phone]  After 7pm go to [email or web address] - password [facility]  [facility] -  Office  [phone]

## 2016-01-30 NOTE — Progress Notes (Signed)
PT Cancellation Note  Patient Details Name: Lindsay [name] MRN: [id number] DOB: Sep 16, 1950   Cancelled Treatment:    Reason Eval/Treat Not Completed: Pain limiting ability to participate. Pt reports pain is better controlled today however is not willing to attempt OOB at this time. Pt reports increased pain when legs are in a dependent position and does not want to attempt sitting up or getting to the chair because of that. Offered posterior scoot from bed to chair (to avoid the dependent position) with max encouragement to participate. Pt continued to decline. Will check back as schedule allows.    Rolinda Roan 01/30/2016, 11:02 AM   Rolinda Roan, PT, DPT Acute Rehabilitation Services Pager: [phone]

## 2016-01-31 ENCOUNTER — Inpatient Hospital Stay (HOSPITAL_COMMUNITY): Payer: Managed Care, Other (non HMO)

## 2016-01-31 DIAGNOSIS — R591 Generalized enlarged lymph nodes: Secondary | ICD-10-CM

## 2016-01-31 DIAGNOSIS — E039 Hypothyroidism, unspecified: Secondary | ICD-10-CM | POA: Diagnosis present

## 2016-01-31 DIAGNOSIS — E872 Acidosis: Secondary | ICD-10-CM

## 2016-01-31 DIAGNOSIS — L52 Erythema nodosum: Secondary | ICD-10-CM | POA: Diagnosis present

## 2016-01-31 DIAGNOSIS — I10 Essential (primary) hypertension: Secondary | ICD-10-CM

## 2016-01-31 DIAGNOSIS — D7582 Heparin induced thrombocytopenia (HIT): Secondary | ICD-10-CM

## 2016-01-31 DIAGNOSIS — T148XXA Other injury of unspecified body region, initial encounter: Secondary | ICD-10-CM

## 2016-01-31 DIAGNOSIS — L089 Local infection of the skin and subcutaneous tissue, unspecified: Secondary | ICD-10-CM

## 2016-01-31 DIAGNOSIS — I890 Lymphedema, not elsewhere classified: Secondary | ICD-10-CM

## 2016-01-31 DIAGNOSIS — T148 Other injury of unspecified body region: Secondary | ICD-10-CM

## 2016-01-31 DIAGNOSIS — L03115 Cellulitis of right lower limb: Secondary | ICD-10-CM

## 2016-01-31 DIAGNOSIS — R599 Enlarged lymph nodes, unspecified: Secondary | ICD-10-CM | POA: Insufficient documentation

## 2016-01-31 DIAGNOSIS — L03116 Cellulitis of left lower limb: Secondary | ICD-10-CM

## 2016-01-31 LAB — WOUND CULTURE
Culture: NO GROWTH
Special Requests: NORMAL

## 2016-01-31 LAB — T4, FREE: Free T4: 1.09 ng/dL (ref 0.61–1.12)

## 2016-01-31 LAB — SEDIMENTATION RATE: Sed Rate: 71 mm/hr — ABNORMAL HIGH (ref 0–22)

## 2016-01-31 LAB — C-REACTIVE PROTEIN: CRP: 18.6 mg/dL — ABNORMAL HIGH (ref <1.0)

## 2016-01-31 MED ORDER — ENSURE ENLIVE PO LIQD
237.0000 mL | Freq: Two times a day (BID) | ORAL | Status: DC
  Administered 2016-01-31 – 2016-02-05 (×11): 237 mL via ORAL

## 2016-01-31 MED ORDER — PREDNISONE 20 MG PO TABS
40.0000 mg | ORAL_TABLET | Freq: Every day | ORAL | Status: DC
  Administered 2016-02-01 – 2016-02-05 (×5): 40 mg via ORAL
  Filled 2016-01-31 (×5): qty 2

## 2016-01-31 MED ORDER — UNJURY CHICKEN SOUP POWDER
2.0000 [oz_av] | Freq: Two times a day (BID) | ORAL | Status: DC
  Administered 2016-01-31 – 2016-02-05 (×11): 2 [oz_av] via ORAL
  Filled 2016-01-31 (×12): qty 27

## 2016-01-31 NOTE — Progress Notes (Signed)
Progress Note    Lindsay [name]  HDQ:[id number] DOB: 06-09-1951  DOA: 01/28/2016 PCP: Merrilee Seashore, MD    Brief Narrative:   Lindsay [name] is an 65 y.o. female the PMH of nonhealing wound of the left leg previously treated with medical therapy. Cultures were positive for MSSA and Pseudomonas and she was treated with an extended course of Cipro/doxycycline. During the hospitalization she had allergic angioedema from Primaxin so this was discontinued. She also was HIT positive but SRA negative. Prior to this she also had a right lower extremity ulcer that required surgical I/D as well as STSG in 2014. Undergone prior arterial duplex studies as well as venous insufficiency studies that were normal. She has been evaluated by VVS who felt the patient's chronic recurrent edema was related to lymphedema. She has done well with compressive therapy and has been compliant.  Patient reports that about a week and a half prior to admission she developed a "knot" on the right lower leg and the right lateral thigh that has subsequently evolved into the ulcers. Of note, this was similar to her presentations both in 2014 and 2015. She has been followed by her dermatologist since the evolution of the new lesions and has been using clobetasol cream without improvement in symptoms. After evaluation today in the office the dermatologist [name]) sent her over for further evaluation and treatment in the ER.  Assessment/Plan:   Principal Problem:   Erythema nodosum with possible superimposed cellulitis of both lower extremities  Patient was admitted and placed on empiric vancomycin and Zosyn. She was evaluated by vascular surgery who felt that she may benefit from Unna boots bilaterally. Previous venous insufficiency workup was normal. Arterial blood flow is normal with distal palpable pulses. Vascular surgeon also recommends ID evaluation given her normal venous insufficiency/arterial insufficiency  evaluations. Evaluated by wound care nurses well. Discussed case with her dermatologist, Dr. Rozann Lesches who reports that biopsies are consistent with erythema nodosum. This is typically not infectious although antibiotics can reduce inflammation. Recommends potassium iodide and steroids. Given that this may rheumatology, I have ordered a complete workup including ESR, CRP, ANA, ANCA, alpha-1 anti-trypsin, rheumatoid factor. Chest x-ray done to evaluate for hilar adenopathy. We'll continue antibiotics for now. In the past, cultures of these wounds have been sterile.   Active Problems:   HTN (hypertension) Continue Norvasc.    History of HIT (heparin-induced thrombocytopenia)  DVT prophylaxis with Arixtra.    Lymphedema of both lower extremities Una boots recommended by vascular surgeon. Would hold off as her presentation is most consistent with erythema nodosum.    Hypothyroidism TSH mildly elevated. Check free T4. Not on Synthroid.    Increased anion gap metabolic acidosis Resolved.    Family Communication/Anticipated D/C date and plan/Code Status   DVT prophylaxis: Arixtra ordered. Code Status: Full Code.  Family Communication: Female visitor in room. Disposition Plan: Home when lower extremity eruption begins to show signs of clearing. Likely 2-3 days.  Medical Consultants:    Vascular surgery  Plastic surgery   Procedures:    Anti-Infectives:   Clindamycin 01/28/16---> 01/28/16 Cipro 01/28/16 I will forward 01/29/16 Doxycycline 01/28/16---> 01/29/16 Vancomycin 01/29/16---> Meropenem 01/29/16--->  Subjective:   [name] tells me she has had these interruptions to her lower extremities for the past 7 years. She's had multiple evaluations but none has led to a diagnosis that she understands. She has seen a local dermatologist and has had multiple biopsies. The wounds are a bit sore. Denies nausea or  vomiting. No diarrhea. No history of GI illnesses.  Objective:     Filed Vitals:   01/30/16 0500 01/30/16 1336 01/30/16 2134 01/31/16 0551  BP: 113/60 146/76 131/47 120/55  Pulse: 81 93 97 80  Temp: 97.7 F (36.5 C) 98.6 F (37 C) 98.6 F (37 C) 97.8 F (36.6 C)  TempSrc:   Oral Oral  Resp: _0 Height:      Weight:      SpO2: 96% 97% 95% 95%    Intake/Output Summary (Last 24 hours) at 01/31/16 0820 Last data filed at 01/31/16 0650  Gross per 24 hour  Intake    720 ml  Output    850 ml  Net   -130 ml   Filed Weights   01/28/16 1201 01/28/16 1701  Weight: 97.977 kg (216 lb) 97.977 kg (216 lb)    Exam: General exam: Appears calm and comfortable.  Respiratory system: Clear to auscultation. Respiratory effort normal. Cardiovascular system: S1 & S2 heard, RRR. No JVD,  rubs, gallops or clicks. No murmurs. Gastrointestinal system: Abdomen is nondistended, soft and nontender. No organomegaly or masses felt. Normal bowel sounds heard. Central nervous system: Alert and oriented. No focal neurological deficits. Extremities: No clubbing, edema, or cyanosis. Skin: Wounds as previously documented and pictured below, without significant changes. Psychiatry: Judgement and insight appear normal. Mood & affect appropriate.        Data Reviewed:   I have personally reviewed following labs and imaging studies:  Labs: Basic Metabolic Panel:  Recent Labs Lab 01/28/16 1300 01/29/16 0346 01/30/16 0657  NA 139 139 139  K 3.5 4.2 4.2  CL 104 103 104  CO2 19* 20* 22  GLUCOSE 102* 107* 97  BUN _1 CREATININE 0.98 1.06* 0.89  CALCIUM 9.2 9.2 9.0   GFR Estimated Creatinine Clearance: 73 mL/min (by C-G formula based on Cr of 0.89). Liver Function Tests: No results for input(s): AST, ALT, ALKPHOS, BILITOT, PROT, ALBUMIN in the last 168 hours. No results for input(s): LIPASE, AMYLASE in the last 168 hours. No results for input(s): AMMONIA in the last 168 hours. Coagulation profile  Recent Labs Lab 01/30/16 1828  INR  1.07    CBC:  Recent Labs Lab 01/28/16 1300 01/29/16 0346 01/30/16 0657  WBC 16.2* 12.8* 13.0*  NEUTROABS 13.4*  --   --   HGB 13.7 13.9 12.0  HCT 42.1 42.1 38.7  MCV 83.7 84.9 85.8  PLT 495* 418* 497*   Thyroid function studies:  Recent Labs  01/28/16 1544  TSH 4.883*   Sepsis Labs:  Recent Labs Lab 01/28/16 1300 01/29/16 0346 01/30/16 0657  WBC 16.2* 12.8* 13.0*   Urine analysis:    Component Value Date/Time   COLORURINE YELLOW 06/01/2014 0006   APPEARANCEUR CLOUDY* 06/01/2014 0006   LABSPEC 1.007 06/01/2014 0006   PHURINE 6.5 06/01/2014 0006   GLUCOSEU NEGATIVE 06/01/2014 0006   HGBUR NEGATIVE 06/01/2014 0006   BILIRUBINUR NEGATIVE 06/01/2014 0006   KETONESUR NEGATIVE 06/01/2014 0006   PROTEINUR NEGATIVE 06/01/2014 0006   UROBILINOGEN 0.2 06/01/2014 0006   NITRITE NEGATIVE 06/01/2014 0006   LEUKOCYTESUR NEGATIVE 06/01/2014 0006   Microbiology Recent Results (from the past 240 hour(s))  Wound culture     Status: None   Collection Time: 01/28/16 12:10 PM  Result Value Ref Range Status   Specimen Description WOUND LEFT LEG  Final   Special Requests Normal  Final   Gram Stain   Final  FEW WBC PRESENT, PREDOMINANTLY MONONUCLEAR NO SQUAMOUS EPITHELIAL CELLS SEEN NO ORGANISMS SEEN Performed at [facility]    Culture   Final    NO GROWTH 2 DAYS Performed at [facility]    Report Status 01/31/2016 FINAL  Final  Culture, blood (Routine X 2) w Reflex to ID Panel     Status: None (Preliminary result)   Collection Time: 01/28/16 12:15 PM  Result Value Ref Range Status   Specimen Description BLOOD RIGHT ANTECUBITAL  Final   Special Requests BOTTLES DRAWN AEROBIC AND ANAEROBIC 5CC  Final   Culture NO GROWTH 2 DAYS  Final   Report Status PENDING  Incomplete  Culture, blood (Routine X 2) w Reflex to ID Panel     Status: None (Preliminary result)   Collection Time: 01/28/16  3:15 PM  Result Value Ref Range Status   Specimen Description  BLOOD RIGHT ANTECUBITAL  Final   Special Requests BOTTLES DRAWN AEROBIC AND ANAEROBIC 5CC  Final   Culture NO GROWTH 2 DAYS  Final   Report Status PENDING  Incomplete    Radiology: No results found.  Medications:   . amLODipine  5 mg Oral Daily  . clobetasol cream  1 application Topical Daily  . fondaparinux (ARIXTRA) injection  2.5 mg Subcutaneous Q0600  . meropenem (MERREM) IV  1 g Intravenous Q8H  . morphine  30 mg Oral Q12H  . potassium iodide  500 mg Oral TID  . vancomycin  1,250 mg Intravenous Q12H   Continuous Infusions:   Time spent: 35 minutes.  The patient is medically complex with multiple co-morbidities and is at high risk for clinical deterioration and requires high complexity decision making.    LOS: 3 days   [place] [facility] Pager [phone]. If unable to reach me by pager, please call my cell phone at [phone].  *Please refer to [email or web address], password [email or web address] to get updated schedule on who will round on this patient, as hospitalists switch teams weekly. If 7PM-7AM, please contact night-coverage at [email or web address], password [email or web address] for any overnight needs.  01/31/2016, 8:20 AM

## 2016-01-31 NOTE — Progress Notes (Signed)
PT Cancellation Note and Discharge  Patient Details Name: Lindsay [name] MRN: [id number] DOB: [birth year]   Cancelled Treatment:    Reason Eval/Treat Not Completed: Pain limiting ability to participate. Max encouragement provided to pt to participate. Pt was educated on benefits of OOB and options for mobility to minimize pain. Pt continued to decline and asks that PT not return until she can tolerate more movement without pain. Will sign off at this time. Please reconsult if needs change or pt is agreeable to therapy.    Rolinda Roan 01/31/2016, 1:20 PM   Rolinda Roan, PT, DPT Acute Rehabilitation Services Pager: [phone]

## 2016-01-31 NOTE — Progress Notes (Signed)
Initial Nutrition Assessment  DOCUMENTATION CODES:   Obesity unspecified  INTERVENTION:  -Unjury Chicken Soup 1 Packet BID, each supplement provides 100 calories an 21g protein -Ensure Enlive po BID, each supplement provides 350 kcal and 20 grams of protein  NUTRITION DIAGNOSIS:   Increased nutrient needs related to wound healing as evidenced by estimated needs.  GOAL:   Patient will meet greater than or equal to 90% of their needs  MONITOR:   PO intake, Supplement acceptance, Labs, I & O's  REASON FOR ASSESSMENT:   Consult Wound healing  ASSESSMENT:   Lindsay [name] is a 65 y.o. female with medical history significant for prior nonhealing wound of the left leg previously treated with medical therapy requiring hospitalization in 2015. Cultures were positive for MSSA and pseudomonas and she was treated with an extended course of Cipro and doxycycline. During the hospitalization she had allergic angioedema from Primaxin so this was discontinued. She also was HIT positive but SRA negative  Spoke with Lindsay [name] at bedside. She endorses good appetite PTA and 3# wt loss over 2 months. She currently has multiple large wounds on her lower extremities that may possibly be vasculitis per MD.  Pt expresses that she understands importance of PO intake to help with wound healing but she has not been eating because "getting out of bed hurts."  She states that even trying to go to the bathroom creates a large amount of pain from her wounds.  She has been avoiding PO intake to not have to go to the bathroom.  Encouraged pt to utilize bedpan to circumvent this. Encouraged PO intake.  Pt was agreeable to try Unjury Chicken Soup and Ensure.  Labs and Medications reviewed: CBGs 101-186  Diet Order:  Diet Heart Room service appropriate?: Yes; Fluid consistency:: Thin  Skin:  Wound (see comment) (Wounds to Leg and Hip)  Last BM:  5/14  Height:   Ht Readings from Last 1 Encounters:   01/28/16 5\' 3"  (1.6 m)    Weight:   Wt Readings from Last 1 Encounters:  01/28/16 216 lb (97.977 kg)    Ideal Body Weight:  52.27 kg  BMI:  Body mass index is 38.27 kg/(m^2).  Estimated Nutritional Needs:   Kcal:  1600-1900 (25-30 cal/kg ABW)  Protein:  100-120 grams  Fluid:  >/= 1.6L  EDUCATION NEEDS:   No education needs identified at this time  Lindsay [name]. Lindsay Herbison, Lindsay, RD LDN Inpatient Clinical Dietitian Pager [phone]

## 2016-02-01 DIAGNOSIS — E039 Hypothyroidism, unspecified: Secondary | ICD-10-CM

## 2016-02-01 LAB — MPO/PR-3 (ANCA) ANTIBODIES
ANCA Proteinase 3: <3.5 U/mL (ref 0.0–3.5)
Myeloperoxidase Abs: <9 U/mL (ref 0.0–9.0)

## 2016-02-01 LAB — ALPHA-1-ANTITRYPSIN: A-1 Antitrypsin, Ser: 238 mg/dL — ABNORMAL HIGH (ref 90–200)

## 2016-02-01 LAB — EXTRACTABLE NUCLEAR ANTIGEN ANTIBODY
ENA SM Ab Ser-aCnc: <0.2 AI (ref 0.0–0.9)
Ribonucleic Protein: 0.2 AI (ref 0.0–0.9)
SSA (Ro) (ENA) Antibody, IgG: <0.2 AI (ref 0.0–0.9)
SSB (La) (ENA) Antibody, IgG: <0.2 AI (ref 0.0–0.9)
Scleroderma (Scl-70) (ENA) Antibody, IgG: <0.2 AI (ref 0.0–0.9)
ds DNA Ab: <1 IU/mL (ref 0–9)

## 2016-02-01 LAB — VANCOMYCIN, TROUGH: Vancomycin Tr: 26 ug/mL (ref 10.0–20.0)

## 2016-02-01 LAB — HEPATITIS PANEL, ACUTE
HCV Ab: 0.1 s/co ratio (ref 0.0–0.9)
Hep A IgM: NEGATIVE
Hep B C IgM: NEGATIVE
Hepatitis B Surface Ag: NEGATIVE

## 2016-02-01 LAB — ANTISTREPTOLYSIN O TITER: ASO: 77 IU/mL (ref 0.0–200.0)

## 2016-02-01 LAB — RHEUMATOID FACTOR: Rhuematoid fact SerPl-aCnc: 14 IU/mL — ABNORMAL HIGH (ref 0.0–13.9)

## 2016-02-01 MED ORDER — IODINE STRONG (LUGOLS) 5 % PO SOLN
3.0000 mL | Freq: Three times a day (TID) | ORAL | Status: DC
  Administered 2016-02-01 – 2016-02-05 (×14): 3 mL via ORAL
  Filled 2016-02-01 (×15): qty 3

## 2016-02-01 MED ORDER — HYDRALAZINE HCL 20 MG/ML IJ SOLN
10.0000 mg | Freq: Once | INTRAMUSCULAR | Status: AC
  Administered 2016-02-02: 10 mg via INTRAVENOUS
  Filled 2016-02-01: qty 1

## 2016-02-01 MED ORDER — VANCOMYCIN HCL IN DEXTROSE 750-5 MG/150ML-% IV SOLN
750.0000 mg | Freq: Two times a day (BID) | INTRAVENOUS | Status: DC
  Administered 2016-02-01 – 2016-02-04 (×6): 750 mg via INTRAVENOUS
  Filled 2016-02-01 (×7): qty 150

## 2016-02-01 NOTE — Progress Notes (Signed)
Pharmacy Antibiotic Note  Lindsay [name] is a 65 y.o. female admitted on 01/28/2016 with non healing leg wounds.  Today is day # 4 of Vancomycin and Meropenem.    Vancomycin trough today = 26 (goal 15-20), SCr stable.  Plan: Decrease Vancomycin to 750mg  IV q12h for estimated level of 15.6 - next dose due tonight at 1800 to allow time for additional drug clearance after elevated level. Continue Meropenem 1gm IV q8h Follow up abx plan/length of therapy.  Height: 5\' 3"  (160 cm) Weight: 216 lb (97.977 kg) IBW/kg (Calculated) : 52.4  Temp (24hrs), Avg:98.4 F (36.9 C), Min:98.3 F (36.8 C), Max:98.4 F (36.9 C)   Recent Labs Lab 01/28/16 1300 01/29/16 0346 01/30/16 0657 02/01/16 1038  WBC 16.2* 12.8* 13.0*  --   CREATININE 0.98 1.06* 0.89  --   VANCOTROUGH  --   --   --  26*    Estimated Creatinine Clearance: 73 mL/min (by C-G formula based on Cr of 0.89).    Allergies  Allergen Reactions  . Ancef [Cefazolin] Rash  . Primaxin [Imipenem] Swelling    Developed lip swelling post administration of Primaxin even though she had tolerated this in the past.  . Heparin     Possible HIT (HIT antibody positive; SRA negative)  . Vancomycin Rash    Antimicrobials this admission: 5/16 Meropenem >> 5/16 Vancomycin >>  Dose adjustments this admission:  5/19 VT = 26 on 1250mg  IV q12h, adjust dose to 750 q12h for est tr = 15.6  Microbiology results: 5/15 BCx: ngtd 5/15 WCx: ng F  Thank you for allowing pharmacy to be a part of this patient's care.  [employer], Pharm.D., BCPS Clinical Pharmacist Pager [phone] 02/01/2016 1:57 PM

## 2016-02-01 NOTE — Progress Notes (Signed)
Progress Note    Lindsay [name]  [id number]:[id number] DOB: 1951/03/23  DOA: 01/28/2016 PCP: Merrilee Seashore, MD    Brief Narrative:   Lindsay [name] is an 65 y.o. female the PMH of nonhealing wound of the left leg previously treated with medical therapy. Cultures were positive for MSSA and Pseudomonas and she was treated with an extended course of Cipro/doxycycline. During the hospitalization she had allergic angioedema from Primaxin so this was discontinued. She also was HIT positive but SRA negative. Prior to this she also had a right lower extremity ulcer that required surgical I/D as well as STSG in 2014. Undergone prior arterial duplex studies as well as venous insufficiency studies that were normal. She has been evaluated by VVS who felt the patient's chronic recurrent edema was related to lymphedema. She has done well with compressive therapy and has been compliant.  Patient reports that about a week and a half prior to admission she developed a "knot" on the right lower leg and the right lateral thigh that has subsequently evolved into the ulcers. Of note, this was similar to her presentations both in 2014 and 2015. She has been followed by her dermatologist since the evolution of the new lesions and has been using clobetasol cream without improvement in symptoms. After evaluation today in the office the dermatologist [name]) sent her over for further evaluation and treatment in the ER.  Assessment/Plan:   Principal Problem:   Erythema nodosum with possible superimposed cellulitis of both lower extremities  Patient was admitted and placed on empiric vancomycin and Zosyn. She was evaluated by vascular surgery who felt that she may benefit from Unna boots bilaterally. Previous venous insufficiency workup was normal. Arterial blood flow is normal with distal palpable pulses. Vascular surgeon also recommends ID evaluation given her normal venous insufficiency/arterial insufficiency  evaluations. Evaluated by wound care nurses well. Discussed case with her dermatologist, Dr. Rozann Lesches who reports that biopsies are consistent with erythema nodosum. This is typically not infectious although antibiotics can reduce inflammation. Recommends potassium iodide and steroids which were started. Given that this may rheumatologic, I have ordered a complete workup including ESR (71), CRP (18.6), ANA, ANCA, alpha-1 anti-trypsin (238), ASO titer (77) rheumatoid factor (14), antiphospholipid antibodies, serum immunofixation and acute hepatitis serologies (negative). Chest x-ray done to evaluate for hilar adenopathy, which was negative. We'll continue antibiotics for now. In the past, cultures of these wounds have been sterile.   Active Problems:   HTN (hypertension) Continue Norvasc.    History of HIT (heparin-induced thrombocytopenia)  DVT prophylaxis with Arixtra.    Lymphedema of both lower extremities Una boots recommended by vascular surgeon. Would hold off as her presentation is most consistent with erythema nodosum.    Hypothyroidism TSH mildly elevated. Free T4 WNL.    Increased anion gap metabolic acidosis Resolved.  Family Communication/Anticipated D/C date and plan/Code Status   DVT prophylaxis: Arixtra ordered. Code Status: Full Code.  Family Communication: No family present today. Disposition Plan: Home when lower extremity eruption begins to show signs of clearing. Likely 2-3 days.  Medical Consultants:    Vascular surgery  Plastic surgery   Procedures:    Anti-Infectives:   Clindamycin 01/28/16---> 01/28/16 Cipro 01/28/16 I will forward 01/29/16 Doxycycline 01/28/16---> 01/29/16 Vancomycin 01/29/16---> Meropenem 01/29/16--->  Subjective:   Lindsay [name] reports that the pain in her lesions has improved. She still has limited mobility secondary to increased pain with movement and fear of causing further pain if she brushes up against anything. Appetite  is  good. No nausea or vomiting.  Objective:    Filed Vitals:   01/31/16 0942 01/31/16 1511 01/31/16 2107 02/01/16 0619  BP: 137/66 149/70 135/66 160/83  Pulse: 83 88 100 87  Temp:  98.3 F (36.8 C) 98.4 F (36.9 C) 98.4 F (36.9 C)  TempSrc:   Oral Oral  Resp:  '18 18 14  ' Height:      Weight:      SpO2:  96% 95% 98%    Intake/Output Summary (Last 24 hours) at 02/01/16 0835 Last data filed at 02/01/16 0650  Gross per 24 hour  Intake   1020 ml  Output   1200 ml  Net   -180 ml   Filed Weights   01/28/16 1201 01/28/16 1701  Weight: 97.977 kg (216 lb) 97.977 kg (216 lb)    Exam: General exam: Appears calm and comfortable.  Respiratory system: Clear to auscultation. Respiratory effort normal. Cardiovascular system: S1 & S2 heard, RRR. No JVD,  rubs, gallops or clicks. No murmurs. Gastrointestinal system: Abdomen is nondistended, soft and nontender. No organomegaly or masses felt. Normal bowel sounds heard. Central nervous system: Alert and oriented. No focal neurological deficits. Extremities: No clubbing, edema, or cyanosis. Skin: Wounds as previously documented and pictured below, without significant changes. Psychiatry: Judgement and insight appear normal. Mood & affect appropriate.        Data Reviewed:   I have personally reviewed following labs and imaging studies:  Labs: Basic Metabolic Panel:  Recent Labs Lab 01/28/16 1300 01/29/16 0346 01/30/16 0657  NA 139 139 139  K 3.5 4.2 4.2  CL 104 103 104  CO2 19* 20* 22  GLUCOSE 102* 107* 97  BUN '14 19 19  ' CREATININE 0.98 1.06* 0.89  CALCIUM 9.2 9.2 9.0   GFR Estimated Creatinine Clearance: 73 mL/min (by C-G formula based on Cr of 0.89). Liver Function Tests: No results for input(s): AST, ALT, ALKPHOS, BILITOT, PROT, ALBUMIN in the last 168 hours. No results for input(s): LIPASE, AMYLASE in the last 168 hours. No results for input(s): AMMONIA in the last 168 hours. Coagulation profile  Recent  Labs Lab 01/30/16 1828  INR 1.07    CBC:  Recent Labs Lab 01/28/16 1300 01/29/16 0346 01/30/16 0657  WBC 16.2* 12.8* 13.0*  NEUTROABS 13.4*  --   --   HGB 13.7 13.9 12.0  HCT 42.1 42.1 38.7  MCV 83.7 84.9 85.8  PLT 495* 418* 497*   Thyroid function studies: No results for input(s): TSH, T4TOTAL, T3FREE, THYROIDAB in the last 72 hours.  Invalid input(s): FREET3 Sepsis Labs:  Recent Labs Lab 01/28/16 1300 01/29/16 0346 01/30/16 0657  WBC 16.2* 12.8* 13.0*   Urine analysis:    Component Value Date/Time   COLORURINE YELLOW 06/01/2014 0006   APPEARANCEUR CLOUDY* 06/01/2014 0006   LABSPEC 1.007 06/01/2014 0006   PHURINE 6.5 06/01/2014 0006   GLUCOSEU NEGATIVE 06/01/2014 0006   HGBUR NEGATIVE 06/01/2014 0006   BILIRUBINUR NEGATIVE 06/01/2014 0006   KETONESUR NEGATIVE 06/01/2014 0006   PROTEINUR NEGATIVE 06/01/2014 0006   UROBILINOGEN 0.2 06/01/2014 0006   NITRITE NEGATIVE 06/01/2014 0006   LEUKOCYTESUR NEGATIVE 06/01/2014 0006   Microbiology Recent Results (from the past 240 hour(s))  Wound culture     Status: None   Collection Time: 01/28/16 12:10 PM  Result Value Ref Range Status   Specimen Description WOUND LEFT LEG  Final   Special Requests Normal  Final   Gram Stain   Final    FEW  WBC PRESENT, PREDOMINANTLY MONONUCLEAR NO SQUAMOUS EPITHELIAL CELLS SEEN NO ORGANISMS SEEN Performed at [facility]    Culture   Final    NO GROWTH 2 DAYS Performed at [facility]    Report Status 01/31/2016 FINAL  Final  Culture, blood (Routine X 2) w Reflex to ID Panel     Status: None (Preliminary result)   Collection Time: 01/28/16 12:15 PM  Result Value Ref Range Status   Specimen Description BLOOD RIGHT ANTECUBITAL  Final   Special Requests BOTTLES DRAWN AEROBIC AND ANAEROBIC 5CC  Final   Culture NO GROWTH 3 DAYS  Final   Report Status PENDING  Incomplete  Culture, blood (Routine X 2) w Reflex to ID Panel     Status: None (Preliminary result)    Collection Time: 01/28/16  3:15 PM  Result Value Ref Range Status   Specimen Description BLOOD RIGHT ANTECUBITAL  Final   Special Requests BOTTLES DRAWN AEROBIC AND ANAEROBIC 5CC  Final   Culture NO GROWTH 3 DAYS  Final   Report Status PENDING  Incomplete    Radiology: Dg Chest 2 View  01/31/2016  CLINICAL DATA:  Question hilar lymphadenopathy. History of nonhealing lower extremity wounds. Initial encounter. EXAM: CHEST  2 VIEW COMPARISON:  None. FINDINGS: There is cardiomegaly without edema. No plain film evidence of lymphadenopathy is seen. No pneumothorax or pleural effusion. No focal bony abnormality. IMPRESSION: Negative for evidence of lymphadenopathy. Cardiomegaly without edema. Electronically Signed   By: Inge Rise M.D.   On: 01/31/2016 15:48    Medications:   . amLODipine  5 mg Oral Daily  . clobetasol cream  1 application Topical Daily  . feeding supplement (ENSURE ENLIVE)  237 mL Oral BID BM  . fondaparinux (ARIXTRA) injection  2.5 mg Subcutaneous Q0600  . meropenem (MERREM) IV  1 g Intravenous Q8H  . morphine  30 mg Oral Q12H  . potassium iodide  500 mg Oral TID  . predniSONE  40 mg Oral Q breakfast  . protein supplement  2 oz Oral BID BM  . vancomycin  1,250 mg Intravenous Q12H   Continuous Infusions:   Time spent: 25 minutes.    LOS: 4 days   [facility] Pager [phone]. If unable to reach me by pager, please call my cell phone at [phone].  *Please refer to [email or web address], password [email or web address] to get updated schedule on who will round on this patient, as hospitalists switch teams weekly. If 7PM-7AM, please contact night-coverage at [email or web address], password [email or web address] for any overnight needs.  02/01/2016, 8:35 AM

## 2016-02-01 NOTE — Progress Notes (Signed)
Message sent to Dr. Rockne Menghini regarding vanc trough of 26

## 2016-02-02 LAB — CULTURE, BLOOD (ROUTINE X 2)
Culture: NO GROWTH
Culture: NO GROWTH

## 2016-02-02 LAB — ANTIPHOSPHOLIPID SYNDROME EVAL, BLD
Anticardiolipin IgA: <9 APL U/mL (ref 0–11)
Anticardiolipin IgG: <9 GPL U/mL (ref 0–14)
Anticardiolipin IgM: <9 MPL U/mL (ref 0–12)
DRVVT: 50 s — ABNORMAL HIGH (ref 0.0–47.0)
PTT Lupus Anticoagulant: 52.5 s — ABNORMAL HIGH (ref 0.0–43.6)
Phosphatydalserine, IgA: 2 APS IgA (ref 0–20)
Phosphatydalserine, IgG: 5 GPS IgG (ref 0–11)
Phosphatydalserine, IgM: 7 MPS IgM (ref 0–25)

## 2016-02-02 LAB — PTT-LA MIX: PTT-LA Mix: 51.2 s — ABNORMAL HIGH (ref 0.0–40.6)

## 2016-02-02 LAB — DRVVT MIX: dRVVT Mix: 44.9 s (ref 0.0–47.0)

## 2016-02-02 LAB — HEXAGONAL PHASE PHOSPHOLIPID: Hexagonal Phase Phospholipid: 11 s (ref 0–11)

## 2016-02-02 MED ORDER — POLYETHYLENE GLYCOL 3350 17 G PO PACK
17.0000 g | PACK | Freq: Once | ORAL | Status: AC
  Administered 2016-02-02: 17 g via ORAL
  Filled 2016-02-02: qty 1

## 2016-02-02 MED ORDER — HYDROCHLOROTHIAZIDE 25 MG PO TABS
25.0000 mg | ORAL_TABLET | Freq: Every day | ORAL | Status: DC
  Administered 2016-02-02 – 2016-02-05 (×4): 25 mg via ORAL
  Filled 2016-02-02 (×4): qty 1

## 2016-02-02 MED ORDER — POLYETHYLENE GLYCOL 3350 17 G PO PACK: 17.0000 g | PACK | Freq: Every day | ORAL | Status: DC | PRN

## 2016-02-02 NOTE — Progress Notes (Signed)
   Progress Note    Lindsay [name]  MRN:[id number] DOB: 01/23/1951  DOA: 01/28/2016 PCP: RAMACHANDRAN,AJITH, MD    Brief Narrative:   Lindsay [name] is an 64 y.o. female the PMH of nonhealing wound of the left leg previously treated with medical therapy. Cultures were positive for MSSA and Pseudomonas and she was treated with an extended course of Cipro/doxycycline. During the hospitalization she had allergic angioedema from Primaxin so this was discontinued. She also was HIT positive but SRA negative. Prior to this she also had a right lower extremity ulcer that required surgical I/D as well as STSG in 2014. Undergone prior arterial duplex studies as well as venous insufficiency studies that were normal. She has been evaluated by VVS who felt the patient's chronic recurrent edema was related to lymphedema. She has done well with compressive therapy and has been compliant.  Patient reports that about a week and a half prior to admission she developed a "knot" on the right lower leg and the right lateral thigh that has subsequently evolved into the ulcers. Of note, this was similar to her presentations both in 2014 and 2015. She has been followed by her dermatologist since the evolution of the new lesions and has been using clobetasol cream without improvement in symptoms. After evaluation today in the office the dermatologist (McDowell) sent her over for further evaluation and treatment in the ER.  Assessment/Plan:   Principal Problem:   Erythema nodosum with possible superimposed cellulitis of both lower extremities  Patient was admitted and placed on empiric vancomycin and Zosyn. She was evaluated by vascular surgery who felt that she may benefit from Unna boots bilaterally. Previous venous insufficiency workup was normal. Arterial blood flow is normal with distal palpable pulses. Vascular surgeon also recommends ID evaluation given her normal venous insufficiency/arterial insufficiency  evaluations. Evaluated by wound care nurses well. Discussed case with her dermatologist, Dr. McConnell who reports that biopsies are consistent with erythema nodosum. This is typically not infectious although antibiotics can reduce inflammation. Recommends potassium iodide and steroids which were started. Given that this may rheumatologic, I have ordered a complete workup including ESR (71), CRP (18.6), extractable nuclear antigens (negative), ANCA (pending), alpha-1 anti-trypsin (238), ASO titer (77) rheumatoid factor (14), antiphospholipid antibodies (pending), serum immunofixation (pending) and acute hepatitis serologies (negative). Chest x-ray done to evaluate for hilar adenopathy, which was negative. We'll continue antibiotics for now. In the past, cultures of these wounds have been sterile.   Active Problems:   HTN (hypertension) Continue Norvasc. Resume HCTZ given elevated BP.    History of HIT (heparin-induced thrombocytopenia)  DVT prophylaxis with Arixtra.    Lymphedema of both lower extremities Una boots recommended by vascular surgeon. Would hold off as her presentation is most consistent with erythema nodosum, and patient reports that her wounds heal faster if left open..    Hypothyroidism TSH mildly elevated. Free T4 WNL.    Increased anion gap metabolic acidosis Resolved.  Family Communication/Anticipated D/C date and plan/Code Status   DVT prophylaxis: Arixtra ordered. Code Status: Full Code.  Family Communication: Husband at the bedside. Disposition Plan: Home when lower extremity eruption begins to show signs of clearing. Likely 1-2 more days.  Medical Consultants:    Vascular surgery  Plastic surgery   Procedures:    Anti-Infectives:   Clindamycin 01/28/16---> 01/28/16 Cipro 01/28/16 I will forward 01/29/16 Doxycycline 01/28/16---> 01/29/16 Vancomycin 01/29/16---> Meropenem 01/29/16--->  Subjective:   Lindsay [name] reports ongoing improvement of pain in her  legs.    Is getting up to the [facility] now.  Appetite OK.  Feels mildly constipated.   Objective:    Filed Vitals:   02/01/16 2025 02/01/16 2245 02/02/16 0123 02/02/16 0700  BP: 199/95 201/102 157/82 149/75  Pulse: 118   94  Temp: 98.2 F (36.8 C)   98.1 F (36.7 C)  TempSrc: Oral   Oral  Resp: 18   16  Height:      Weight:      SpO2: 99%   96%    Intake/Output Summary (Last 24 hours) at 02/02/16 0907 Last data filed at 02/01/16 1300  Gross per 24 hour  Intake    630 ml  Output    400 ml  Net    230 ml   Filed Weights   01/28/16 1201 01/28/16 1701  Weight: 97.977 kg (216 lb) 97.977 kg (216 lb)    Exam: General exam: Appears calm and comfortable.  Respiratory system: Clear to auscultation. Respiratory effort normal. Cardiovascular system: S1 & S2 heard, RRR. No JVD,  rubs, gallops or clicks. No murmurs. Gastrointestinal system: Abdomen is nondistended, soft and nontender. No organomegaly or masses felt. Normal bowel sounds heard. Central nervous system: Alert and oriented. No focal neurological deficits. Extremities: No clubbing, edema, or cyanosis. Skin: Wounds as previously documented and pictured below, beginning to regress. Psychiatry: Judgement and insight appear normal. Mood & affect appropriate.        Data Reviewed:   I have personally reviewed following labs and imaging studies:  Labs: Basic Metabolic Panel:  Recent Labs Lab 01/28/16 1300 01/29/16 0346 01/30/16 0657  NA 139 139 139  K 3.5 4.2 4.2  CL 104 103 104  CO2 19* 20* 22  GLUCOSE 102* 107* 97  BUN 14 19 19  CREATININE 0.98 1.06* 0.89  CALCIUM 9.2 9.2 9.0   GFR Estimated Creatinine Clearance: 73 mL/min (by C-G formula based on Cr of 0.89). Liver Function Tests: No results for input(s): AST, ALT, ALKPHOS, BILITOT, PROT, ALBUMIN in the last 168 hours. No results for input(s): LIPASE, AMYLASE in the last 168 hours. No results for input(s): AMMONIA in the last 168 hours. Coagulation  profile  Recent Labs Lab 01/30/16 1828  INR 1.07    CBC:  Recent Labs Lab 01/28/16 1300 01/29/16 0346 01/30/16 0657  WBC 16.2* 12.8* 13.0*  NEUTROABS 13.4*  --   --   HGB 13.7 13.9 12.0  HCT 42.1 42.1 38.7  MCV 83.7 84.9 85.8  PLT 495* 418* 497*   Urine analysis:    Component Value Date/Time   COLORURINE YELLOW 06/01/2014 0006   APPEARANCEUR CLOUDY* 06/01/2014 0006   LABSPEC 1.007 06/01/2014 0006   PHURINE 6.5 06/01/2014 0006   GLUCOSEU NEGATIVE 06/01/2014 0006   HGBUR NEGATIVE 06/01/2014 0006   BILIRUBINUR NEGATIVE 06/01/2014 0006   KETONESUR NEGATIVE 06/01/2014 0006   PROTEINUR NEGATIVE 06/01/2014 0006   UROBILINOGEN 0.2 06/01/2014 0006   NITRITE NEGATIVE 06/01/2014 0006   LEUKOCYTESUR NEGATIVE 06/01/2014 0006   Microbiology Recent Results (from the past 240 hour(s))  Wound culture     Status: None   Collection Time: 01/28/16 12:10 PM  Result Value Ref Range Status   Specimen Description WOUND LEFT LEG  Final   Special Requests Normal  Final   Gram Stain   Final    FEW WBC PRESENT, PREDOMINANTLY MONONUCLEAR NO SQUAMOUS EPITHELIAL CELLS SEEN NO ORGANISMS SEEN Performed at [facility]    Culture   Final    NO GROWTH 2 DAYS   Performed at [facility]    Report Status 01/31/2016 FINAL  Final  Culture, blood (Routine X 2) w Reflex to ID Panel     Status: None (Preliminary result)   Collection Time: 01/28/16 12:15 PM  Result Value Ref Range Status   Specimen Description BLOOD RIGHT ANTECUBITAL  Final   Special Requests BOTTLES DRAWN AEROBIC AND ANAEROBIC 5CC  Final   Culture NO GROWTH 4 DAYS  Final   Report Status PENDING  Incomplete  Culture, blood (Routine X 2) w Reflex to ID Panel     Status: None (Preliminary result)   Collection Time: 01/28/16  3:15 PM  Result Value Ref Range Status   Specimen Description BLOOD RIGHT ANTECUBITAL  Final   Special Requests BOTTLES DRAWN AEROBIC AND ANAEROBIC 5CC  Final   Culture NO GROWTH 4 DAYS   Final   Report Status PENDING  Incomplete    Radiology: Dg Chest 2 View  01/31/2016  CLINICAL DATA:  Question hilar lymphadenopathy. History of nonhealing lower extremity wounds. Initial encounter. EXAM: CHEST  2 VIEW COMPARISON:  None. FINDINGS: There is cardiomegaly without edema. No plain film evidence of lymphadenopathy is seen. No pneumothorax or pleural effusion. No focal bony abnormality. IMPRESSION: Negative for evidence of lymphadenopathy. Cardiomegaly without edema. Electronically Signed   By: Thomas  Dalessio M.D.   On: 01/31/2016 15:48    Medications:   . amLODipine  5 mg Oral Daily  . clobetasol cream  1 application Topical Daily  . feeding supplement (ENSURE ENLIVE)  237 mL Oral BID BM  . fondaparinux (ARIXTRA) injection  2.5 mg Subcutaneous Q0600  . Iodine Strong (Lugols)  3 mL Oral TID  . meropenem (MERREM) IV  1 g Intravenous Q8H  . morphine  30 mg Oral Q12H  . predniSONE  40 mg Oral Q breakfast  . protein supplement  2 oz Oral BID BM  . vancomycin  750 mg Intravenous Q12H   Continuous Infusions:   Time spent: 25 minutes.    LOS: 5 days   RAMA,CHRISTINA  [facility] Pager [phone]. If unable to reach me by pager, please call my cell phone at [phone].  *Please refer to [email or web address], password [email or web address] to get updated schedule on who will round on this patient, as hospitalists switch teams weekly. If 7PM-7AM, please contact night-coverage at [email or web address], password [email or web address] for any overnight needs.  02/02/2016, 9:07 AM            

## 2016-02-03 LAB — BASIC METABOLIC PANEL
Anion gap: 8 (ref 5–15)
BUN: 19 mg/dL (ref 6–20)
CO2: 27 mmol/L (ref 22–32)
Calcium: 9 mg/dL (ref 8.9–10.3)
Chloride: 102 mmol/L (ref 101–111)
Creatinine, Ser: 0.72 mg/dL (ref 0.44–1.00)
GFR calc Af Amer: >60 mL/min (ref >60)
GFR calc non Af Amer: >60 mL/min (ref >60)
Glucose, Bld: 116 mg/dL — ABNORMAL HIGH (ref 65–99)
Potassium: 4.2 mmol/L (ref 3.5–5.1)
Sodium: 137 mmol/L (ref 135–145)

## 2016-02-03 LAB — CBC
HCT: 38.1 % (ref 36.0–46.0)
Hemoglobin: 11.9 g/dL — ABNORMAL LOW (ref 12.0–15.0)
MCH: 26.1 pg (ref 26.0–34.0)
MCHC: 31.2 g/dL (ref 30.0–36.0)
MCV: 83.6 fL (ref 78.0–100.0)
Platelets: 637 10*3/uL — ABNORMAL HIGH (ref 150–400)
RBC: 4.56 MIL/uL (ref 3.87–5.11)
RDW: 14.1 % (ref 11.5–15.5)
WBC: 13.6 10*3/uL — ABNORMAL HIGH (ref 4.0–10.5)

## 2016-02-03 MED ORDER — FONDAPARINUX SODIUM 5 MG/0.4ML ~~LOC~~ SOLN
2.5000 mg | Freq: Every day | SUBCUTANEOUS | Status: DC
  Administered 2016-02-04 – 2016-02-05 (×2): 2.5 mg via SUBCUTANEOUS
  Filled 2016-02-03 (×3): qty 0.4

## 2016-02-03 NOTE — Progress Notes (Addendum)
Progress Note    Lindsay [name]  GXQ:[id number] DOB: 04-22-1951  DOA: 01/28/2016 PCP: Merrilee Seashore, MD    Brief Narrative:   Lindsay [name] is an 65 y.o. female the PMH of nonhealing wound of the left leg previously treated with medical therapy. Cultures were positive for MSSA and Pseudomonas and she was treated with an extended course of Cipro/doxycycline. During the hospitalization she had allergic angioedema from Primaxin so this was discontinued. She also was HIT positive but SRA negative. Prior to this she also had a right lower extremity ulcer that required surgical I/D as well as STSG in 2014. Undergone prior arterial duplex studies as well as venous insufficiency studies that were normal. She has been evaluated by VVS who felt the patient's chronic recurrent edema was related to lymphedema. She has done well with compressive therapy and has been compliant.  Patient reports that about a week and a half prior to admission she developed a "knot" on the right lower leg and the right lateral thigh that has subsequently evolved into the ulcers. Of note, this was similar to her presentations both in 2014 and 2015. She has been followed by her dermatologist since the evolution of the new lesions and has been using clobetasol cream without improvement in symptoms. After evaluation today in the office the dermatologist [name]) sent her over for further evaluation and treatment in the ER.  Assessment/Plan:   Principal Problem:   Erythema nodosum with possible superimposed cellulitis of both lower extremities  Patient was admitted and placed on empiric vancomycin and Zosyn. She was evaluated by vascular surgery who felt that she may benefit from Unna boots bilaterally. Previous venous insufficiency workup was normal. Arterial blood flow is normal with distal palpable pulses. Vascular surgeon also recommends ID evaluation given her normal venous insufficiency/arterial insufficiency  evaluations. Evaluated by wound care nurses well. Discussed case with her dermatologist, Dr. Rozann Lesches who reports that biopsies are consistent with erythema nodosum. This is typically not infectious although antibiotics can reduce inflammation. Recommends potassium iodide and steroids which were started. Given that this may rheumatologic, I have ordered a complete workup including ESR (71), CRP (18.6), extractable nuclear antigens (negative), ANCA (negative), alpha-1 anti-trypsin (238), ASO titer (77) rheumatoid factor (14), antiphospholipid antibodies (+ for PTT lupus anticoagulant/DRVVT---of no significance in the setting of negative anticardiolipin antibodies, discussed with Dr. Beryle Beams), serum immunofixation (pending) and acute hepatitis serologies (negative). Chest x-ray done to evaluate for hilar adenopathy, which was negative. We'll stop IV antibiotics and place on oral monocycline. In the past, cultures of these wounds have been sterile.   Active Problems:   HTN (hypertension) Continue Norvasc. Resume HCTZ given elevated BP.    History of HIT (heparin-induced thrombocytopenia)  DVT prophylaxis with Arixtra.    Lymphedema of both lower extremities Una boots recommended by vascular surgeon. Would hold off as her presentation is most consistent with erythema nodosum, and patient reports that her wounds heal faster if left open..    Hypothyroidism TSH mildly elevated. Free T4 WNL.    Increased anion gap metabolic acidosis Resolved.  Family Communication/Anticipated D/C date and plan/Code Status   DVT prophylaxis: Arixtra ordered. Code Status: Full Code.  Family Communication: Husband at the bedside. Disposition Plan: Home when lower extremity eruption begins to show signs of clearing. Likely 1-2 more days.  Medical Consultants:    Vascular surgery  Plastic surgery   Procedures:    Anti-Infectives:   Clindamycin 01/28/16---> 01/28/16 Cipro 01/28/16 I will forward  01/29/16 Doxycycline  01/28/16---> 01/29/16 Vancomycin 01/29/16--->02/04/16 Meropenem 01/29/16---> 02/04/16 Minocycline 02/04/16--->  Subjective:   Lindsay [name] reports ongoing improvement of pain in her legs.  Is getting up to the [facility] now.  Appetite OK.  Bowels moved.  Objective:    Filed Vitals:   02/02/16 0123 02/02/16 0700 02/02/16 1500 02/02/16 2129  BP: 157/82 149/75 147/75 185/90  Pulse:  94 92 103  Temp:  98.1 F (36.7 C) 98.7 F (37.1 C) 97.7 F (36.5 C)  TempSrc:  Oral Oral Oral  Resp:  '16 17 18  ' Height:      Weight:      SpO2:  96% 98% 97%    Intake/Output Summary (Last 24 hours) at 02/03/16 0907 Last data filed at 02/02/16 1800  Gross per 24 hour  Intake   1200 ml  Output      0 ml  Net   1200 ml   Filed Weights   01/28/16 1201 01/28/16 1701  Weight: 97.977 kg (216 lb) 97.977 kg (216 lb)    Exam: General exam: Appears calm and comfortable.  Respiratory system: Clear to auscultation. Respiratory effort normal. Cardiovascular system: S1 & S2 heard, RRR. No JVD,  rubs, gallops or clicks. No murmurs. Gastrointestinal system: Abdomen is nondistended, soft and nontender. No organomegaly or masses felt. Normal bowel sounds heard. Central nervous system: Alert and oriented. No focal neurological deficits. Extremities: No clubbing, edema, or cyanosis. Skin: Wounds as previously documented and pictured below, beginning to regress. Psychiatry: Judgement and insight appear normal. Mood & affect appropriate.        Data Reviewed:   I have personally reviewed following labs and imaging studies:  Labs: Basic Metabolic Panel:  Recent Labs Lab 01/28/16 1300 01/29/16 0346 01/30/16 0657 02/03/16 0421  NA 139 139 139 137  K 3.5 4.2 4.2 4.2  CL 104 103 104 102  CO2 19* 20* 22 27  GLUCOSE 102* 107* 97 116*  BUN '14 19 19 19  ' CREATININE 0.98 1.06* 0.89 0.72  CALCIUM 9.2 9.2 9.0 9.0   GFR Estimated Creatinine Clearance: 81.3 mL/min (by C-G formula based on Cr  of 0.72). Liver Function Tests: No results for input(s): AST, ALT, ALKPHOS, BILITOT, PROT, ALBUMIN in the last 168 hours. No results for input(s): LIPASE, AMYLASE in the last 168 hours. No results for input(s): AMMONIA in the last 168 hours. Coagulation profile  Recent Labs Lab 01/30/16 1828  INR 1.07    CBC:  Recent Labs Lab 01/28/16 1300 01/29/16 0346 01/30/16 0657 02/03/16 0421  WBC 16.2* 12.8* 13.0* 13.6*  NEUTROABS 13.4*  --   --   --   HGB 13.7 13.9 12.0 11.9*  HCT 42.1 42.1 38.7 38.1  MCV 83.7 84.9 85.8 83.6  PLT 495* 418* 497* 637*   Urine analysis:    Component Value Date/Time   COLORURINE YELLOW 06/01/2014 0006   APPEARANCEUR CLOUDY* 06/01/2014 0006   LABSPEC 1.007 06/01/2014 0006   PHURINE 6.5 06/01/2014 0006   GLUCOSEU NEGATIVE 06/01/2014 0006   HGBUR NEGATIVE 06/01/2014 0006   BILIRUBINUR NEGATIVE 06/01/2014 0006   KETONESUR NEGATIVE 06/01/2014 0006   PROTEINUR NEGATIVE 06/01/2014 0006   UROBILINOGEN 0.2 06/01/2014 0006   NITRITE NEGATIVE 06/01/2014 0006   LEUKOCYTESUR NEGATIVE 06/01/2014 0006   Microbiology Recent Results (from the past 240 hour(s))  Wound culture     Status: None   Collection Time: 01/28/16 12:10 PM  Result Value Ref Range Status   Specimen Description WOUND LEFT LEG  Final   Special Requests  Normal  Final   Gram Stain   Final    FEW WBC PRESENT, PREDOMINANTLY MONONUCLEAR NO SQUAMOUS EPITHELIAL CELLS SEEN NO ORGANISMS SEEN Performed at [facility]    Culture   Final    NO GROWTH 2 DAYS Performed at [facility]    Report Status 01/31/2016 FINAL  Final  Culture, blood (Routine X 2) w Reflex to ID Panel     Status: None   Collection Time: 01/28/16 12:15 PM  Result Value Ref Range Status   Specimen Description BLOOD RIGHT ANTECUBITAL  Final   Special Requests BOTTLES DRAWN AEROBIC AND ANAEROBIC 5CC  Final   Culture NO GROWTH 5 DAYS  Final   Report Status 02/02/2016 FINAL  Final  Culture, blood (Routine  X 2) w Reflex to ID Panel     Status: None   Collection Time: 01/28/16  3:15 PM  Result Value Ref Range Status   Specimen Description BLOOD RIGHT ANTECUBITAL  Final   Special Requests BOTTLES DRAWN AEROBIC AND ANAEROBIC 5CC  Final   Culture NO GROWTH 5 DAYS  Final   Report Status 02/02/2016 FINAL  Final    Radiology: No results found.  Medications:   . amLODipine  5 mg Oral Daily  . clobetasol cream  1 application Topical Daily  . feeding supplement (ENSURE ENLIVE)  237 mL Oral BID BM  . fondaparinux (ARIXTRA) injection  2.5 mg Subcutaneous Q0600  . hydrochlorothiazide  25 mg Oral Daily  . Iodine Strong (Lugols)  3 mL Oral TID  . meropenem (MERREM) IV  1 g Intravenous Q8H  . morphine  30 mg Oral Q12H  . predniSONE  40 mg Oral Q breakfast  . protein supplement  2 oz Oral BID BM  . vancomycin  750 mg Intravenous Q12H   Continuous Infusions:   Time spent: 25 minutes.    LOS: 6 days   [facility] Pager [phone]. If unable to reach me by pager, please call my cell phone at [phone].  *Please refer to [email or web address], password [email or web address] to get updated schedule on who will round on this patient, as hospitalists switch teams weekly. If 7PM-7AM, please contact night-coverage at [email or web address], password [email or web address] for any overnight needs.  02/03/2016, 9:07 AM

## 2016-02-04 LAB — ANCA TITERS
Atypical P-ANCA titer: 1:160 {titer} — ABNORMAL HIGH
C-ANCA: <1:20 {titer}
P-ANCA: <1:20 {titer}

## 2016-02-04 LAB — IMMUNOFIXATION ELECTROPHORESIS
IgA: 293 mg/dL (ref 87–352)
IgG (Immunoglobin G), Serum: 1273 mg/dL (ref 700–1600)
IgM, Serum: 78 mg/dL (ref 26–217)
Total Protein ELP: 6.6 g/dL (ref 6.0–8.5)

## 2016-02-04 MED ORDER — TACROLIMUS 0.1 % EX OINT
TOPICAL_OINTMENT | Freq: Two times a day (BID) | CUTANEOUS | Status: DC
  Administered 2016-02-05: 10:00:00 via TOPICAL
  Filled 2016-02-04 (×18): qty 30

## 2016-02-04 MED ORDER — MINOCYCLINE HCL 100 MG PO CAPS
100.0000 mg | ORAL_CAPSULE | Freq: Two times a day (BID) | ORAL | Status: DC
  Administered 2016-02-04 – 2016-02-05 (×3): 100 mg via ORAL
  Filled 2016-02-04 (×4): qty 1

## 2016-02-04 MED ORDER — COLCHICINE 0.6 MG PO TABS
0.6000 mg | ORAL_TABLET | Freq: Two times a day (BID) | ORAL | Status: DC
  Administered 2016-02-04 – 2016-02-05 (×3): 0.6 mg via ORAL
  Filled 2016-02-04 (×3): qty 1

## 2016-02-04 NOTE — Progress Notes (Addendum)
Pharmacy Antibiotic Note  Lindsay [name] is a 65 y.o. female admitted on 01/28/2016 with non healing leg wounds.  Today is day # 7 of Vancomycin and Meropenem.  On 5/19: Vancomycin trough = 26 (goal 15-20) on dose of 1250 mg IV q12h. SCr was 0.89 stable. Vancomycin dose was decreased to 750 mg IVq12h.   SCr stable at 0.72 and WBC = 13.6 on 5/21.  Afebrile. Blood cultures and wound cultures are negative. IV antibiotics are continuing.  Will recheck Vancomycin trough.    Plan: Continune Vancomycin to 750mg  IV q12h  -  Will recheck Vancomycin trough in AM.  Continue Meropenem 1gm IV q8h Follow up abx plan/length of therapy.  Height: 5\' 3"  (160 cm) Weight: 216 lb (97.977 kg) IBW/kg (Calculated) : 52.4  Temp (24hrs), Avg:98 F (36.7 C), Min:97.7 F (36.5 C), Max:98.2 F (36.8 C)   Recent Labs Lab 01/28/16 1300 01/29/16 0346 01/30/16 0657 02/01/16 1038 02/03/16 0421  WBC 16.2* 12.8* 13.0*  --  13.6*  CREATININE 0.98 1.06* 0.89  --  0.72  VANCOTROUGH  --   --   --  26*  --     Estimated Creatinine Clearance: 81.3 mL/min (by C-G formula based on Cr of 0.72).    Allergies  Allergen Reactions  . Ancef [Cefazolin] Rash  . Primaxin [Imipenem] Swelling    Developed lip swelling post administration of Primaxin even though she had tolerated this in the past.  . Heparin     Possible HIT (HIT antibody positive; SRA negative)  . Vancomycin Rash    Antimicrobials this admission: 5/16 Meropenem >> 5/16 Vancomycin >>  Dose adjustments this admission: 5/19 VT = 26 on 1250mg  IV q12h, decreased dose to 750 q12h for est trough = 15.6  Microbiology results: 5/15 BCx: neg/F 5/15 WCx: neg/F  Thank you for allowing pharmacy to be a part of this patient's care. Nicole Cella, RPh Clinical Pharmacist Pager: [phone] 02/04/2016 10:50 AM   ADDENDUM;  Dr. Rockne Menghini has discontinued the vanc & meropenem today.  Discontinue vancomycin trough  Nicole Cella, RPh Clinical Pharmacist Pager:  [phone] 02/04/2016 1:27 PM

## 2016-02-05 MED ORDER — PREDNISONE 20 MG PO TABS: 40.0000 mg | ORAL_TABLET | Freq: Every day | ORAL | Status: DC

## 2016-02-05 MED ORDER — PANTOPRAZOLE SODIUM 40 MG PO TBEC: 40.0000 mg | DELAYED_RELEASE_TABLET | Freq: Every day | ORAL | Status: DC

## 2016-02-05 MED ORDER — TACROLIMUS 0.1 % EX OINT: TOPICAL_OINTMENT | Freq: Two times a day (BID) | CUTANEOUS | Status: DC

## 2016-02-05 MED ORDER — OXYCODONE HCL 5 MG PO TABS: 5.0000 mg | ORAL_TABLET | ORAL | Status: DC | PRN

## 2016-02-05 MED ORDER — COLCHICINE 0.6 MG PO TABS: 0.6000 mg | ORAL_TABLET | Freq: Two times a day (BID) | ORAL | Status: DC

## 2016-02-05 MED ORDER — ENSURE ENLIVE PO LIQD: 237.0000 mL | Freq: Two times a day (BID) | ORAL | Status: DC

## 2016-02-05 MED ORDER — POLYETHYLENE GLYCOL 3350 17 G PO PACK: 17.0000 g | PACK | Freq: Every day | ORAL | Status: DC | PRN

## 2016-02-05 NOTE — Care Management Note (Addendum)
Case Management Note  Patient Details  Name: Lindsay [name] MRN: [id number] Date of Birth: 1951-02-12  Subjective/Objective:                Admitted with bilateral leg ulcers    Action/Plan: Spoke with patient about discharge plan, gave her choice for home health agency, she selected [facility]. Contacted Kitiara Hintze with [facility], set up [facility] to monitor leg wounds and re-enforce would care. Patient stated that her significant other and her son would be able to assist her after discharge. Patient stated that she has a rolling walker and was using it for ambulation prior to admission.   Expected Discharge Date:                  Expected Discharge Plan:  [place]  In-House Referral:  NA  Discharge planning Services  CM Consult  Post Acute Care Choice:  Home Health Choice offered to:  Patient  DME Arranged:  N/A DME Agency:     HH Arranged:  RN [place] Agency:  [facility]  Status of Service:  Completed, signed off  Medicare Important Message Given:    Date Medicare IM Given:    Medicare IM give by:    Date Additional Medicare IM Given:    Additional Medicare Important Message give by:     If discussed at [place] of Stay Meetings, dates discussed:    Additional Comments:  Lindsay Nephew, RN 02/05/2016, 3:04 PM

## 2016-02-05 NOTE — Progress Notes (Signed)
Pt ready for d/c home per MD. Discharge instructions and prescriptions reviewed with pt and her son, all their questions were answered. She is using a walker to assist with ambulation, but has one already at home. Pt wuill be assisted to car by NT. Belongings packed and sent with son to car.   [place], Jerry Caras

## 2016-02-05 NOTE — Discharge Summary (Signed)
Physician Discharge Summary  Lindsay [name] EWY:[id number] DOB: [birth year] DOA: 01/28/2016  PCP: Merrilee Seashore, MD  Admit date: 01/28/2016 Discharge date: 02/05/2016   Recommendations for Outpatient Follow-Up:   1. The patient has F/U scheduled with her dermatologist 02/13/16 at 2 pm. Will need to determine if steroids still needed at that visit, discharged on prednisone 40 mg PO daily.   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    Discharge Condition: Improved.  Diet recommendation:   Wound care: Cleanse daily with NS and cover with a non-adherent dressing.   History of Present Illness:   Lindsay [name] is an 65 y.o. female the PMH of nonhealing wound of the left leg previously treated with medical therapy. Cultures were positive for MSSA and Pseudomonas and she was treated with an extended course of Cipro/doxycycline. During the hospitalization she had allergic angioedema from Primaxin so this was discontinued. She also was HIT positive but SRA negative. Prior to this she also had a right lower extremity ulcer that required surgical I/D as well as STSG in 2014. Undergone prior arterial duplex studies as well as venous insufficiency studies that were normal. She has been evaluated by [facility]VS who felt the patient's chronic recurrent edema was related to lymphedema. She has done well with compressive therapy and has been compliant.  Patient reports that about a week and a half prior to admission she developed a "knot" on the right lower leg and the right lateral thigh that has  subsequently evolved into the ulcers. Of note, this was similar to her presentations both in 2014 and 2015. She has been followed by her dermatologist since the evolution of the new lesions and has been using clobetasol cream without improvement in symptoms. After evaluation today in the office the dermatologist [name]) sent her over for further evaluation and treatment in the ER.   Hospital Course by Problem:   Principal Problem:  Erythema nodosum with possible superimposed cellulitis of both lower extremities  Patient was admitted and placed on empiric vancomycin and Zosyn. She was evaluated by vascular surgery who felt that she may benefit from Unna boots bilaterally. Previous venous insufficiency workup was normal. Arterial blood flow is normal with distal palpable pulses. Evaluated by wound care nurse as well. Discussed case with her dermatologist, Dr. Rozann Lesches who reported that biopsies were consistent with erythema nodosum. This is typically not infectious although antibiotics can reduce inflammation. Recommended potassium iodide and steroids which were started. Given that this may rheumatologic, a complete workup including ESR (71), CRP (18.6), extractable nuclear antigens (negative), ANCA (negative), alpha-1 anti-trypsin (238), ASO titer (77) rheumatoid factor (14), antiphospholipid antibodies (+ for PTT lupus anticoagulant/DRVVT---of no significance in the setting of negative anticardiolipin antibodies, discussed with Dr. Beryle Beams), serum immunofixation (WNL) and acute hepatitis serologies (negative) were ordered. Chest x-ray done to evaluate for hilar adenopathy, which was negative. Due to high risk for infection, she was treated with broad spectrum antibiotics during her hospital stay, but does not need any further antibiotics. I have arranged for her to see her dermatologist on 02/13/16 for follow up.  Wound and blood cultures negative.  Active Problems:  HTN (hypertension) Continue  Norvasc and HCTZ .   History of HIT (heparin-induced thrombocytopenia)  DVT prophylaxis with Arixtra.   Lymphedema of both lower extremities Una boots recommended by vascular surgeon. Would hold off as her presentation is most consistent with erythema nodosum, and patient reports that her wounds heal faster if left open..   Hypothyroidism TSH mildly elevated. Free T4 WNL.   Increased anion gap metabolic acidosis Resolved.   Medical Consultants:    Vascular surgery  Plastic surgery   Discharge Exam:   Filed Vitals:   02/05/16 0701 02/05/16 1300  BP: 176/90 174/82  Pulse: 84 86  Temp: 97.8 F (36.6 C) 97.7 F (36.5 C)  Resp: 18 18   Filed Vitals:   02/04/16 1451 02/04/16 2030 02/05/16 0701 02/05/16 1300  BP: 149/73 166/76 176/90 174/82  Pulse: 87 95 84 86  Temp: 98.7 F (37.1 C) 98.2 F (36.8 C) 97.8 F (36.6 C) 97.7 F (36.5 C)  TempSrc: Oral Oral Oral Oral  Resp: _0 Height:      Weight:      SpO2: 98% 99% 96% 98%   General exam: Appears calm and comfortable.  Respiratory system: Clear to auscultation. Respiratory effort normal. Cardiovascular system: S1 & S2 heard, RRR. No JVD, rubs, gallops or clicks. No murmurs. Gastrointestinal system: Abdomen is nondistended, soft and nontender. No organomegaly or masses felt. Normal bowel sounds heard. Central nervous system: Alert and oriented. No focal neurological deficits. Extremities: No clubbing, edema, or cyanosis. Skin: Wounds BLE, beginning to regress. Psychiatry: Judgement and insight appear normal. Mood & affect appropriate.   The results of significant diagnostics from this hospitalization (including imaging, microbiology, ancillary and laboratory) are listed below for reference.     Procedures and Diagnostic Studies:   Dg Tibia/fibula Left  03-Feb-2016  CLINICAL DATA:  Draining wound in lateral left calf EXAM: LEFT TIBIA AND FIBULA - 2 VIEW COMPARISON:  None. FINDINGS: No acute bony  abnormality is noted. No fracture or dislocation is seen. No bony erosion is noted. Soft tissue swelling is noted laterally consistent with the given clinical history. IMPRESSION: Soft tissue inflammatory change without evidence of bony involvement. Electronically Signed   By: Inez Catalina M.D.   On: 02-03-2016 14:31     Labs:   Basic Metabolic Panel:  Recent Labs Lab 01/30/16 0657 02/03/16 0421  NA 139 137  K 4.2 4.2  CL 104 102  CO2 22 27  GLUCOSE 97 116*  BUN 19 19  CREATININE 0.89 0.72  CALCIUM 9.0 9.0   GFR Estimated Creatinine Clearance: 81.3 mL/min (by C-G formula based on Cr of 0.72). Liver Function Tests: No results for input(s): AST, ALT, ALKPHOS, BILITOT, PROT, ALBUMIN in the last 168 hours. No results for input(s): LIPASE, AMYLASE in the last 168 hours. No results for input(s): AMMONIA in the last 168 hours. Coagulation profile  Recent Labs Lab 01/30/16 1828  INR 1.07    CBC:  Recent Labs Lab 01/30/16 0657 02/03/16 0421  WBC 13.0* 13.6*  HGB 12.0 11.9*  HCT 38.7 38.1  MCV 85.8 83.6  PLT 497* 637*   Microbiology Recent Results (from the past 240 hour(s))  Wound culture     Status: None   Collection Time: 2016-02-03 12:10 PM  Result Value Ref Range Status   Specimen Description WOUND LEFT LEG  Final   Special Requests Normal  Final   Gram Stain   Final  FEW WBC PRESENT, PREDOMINANTLY MONONUCLEAR NO SQUAMOUS EPITHELIAL CELLS SEEN NO ORGANISMS SEEN Performed at [facility]    Culture   Final    NO GROWTH 2 DAYS Performed at [facility]    Report Status 01/31/2016 FINAL  Final  Culture, blood (Routine X 2) w Reflex to ID Panel     Status: None   Collection Time: 01/28/16 12:15 PM  Result Value Ref Range Status   Specimen Description BLOOD RIGHT ANTECUBITAL  Final   Special Requests BOTTLES DRAWN AEROBIC AND ANAEROBIC 5CC  Final   Culture NO GROWTH 5 DAYS  Final   Report Status 02/02/2016 FINAL  Final  Culture, blood  (Routine X 2) w Reflex to ID Panel     Status: None   Collection Time: 01/28/16  3:15 PM  Result Value Ref Range Status   Specimen Description BLOOD RIGHT ANTECUBITAL  Final   Special Requests BOTTLES DRAWN AEROBIC AND ANAEROBIC 5CC  Final   Culture NO GROWTH 5 DAYS  Final   Report Status 02/02/2016 FINAL  Final     Discharge Instructions:   Discharge Instructions    Call MD for:  extreme fatigue    Complete by:  As directed      Call MD for:  severe uncontrolled pain    Complete by:  As directed      Call MD for:  temperature >100.4    Complete by:  As directed      Diet - low sodium heart healthy    Complete by:  As directed      Face-to-face encounter (required for Medicare/Medicaid patients)    Complete by:  As directed   I Calvary Difranco certify that this patient is under my care and that I, or a nurse practitioner or physician's assistant working with me, had a face-to-face encounter that meets the physician face-to-face encounter requirements with this patient on 02/05/2016. The encounter with the patient was in whole, or in part for the following medical condition(s) which is the primary reason for home health care (List medical condition): Open complex bilateral leg wounds which need ongoing care and assessment.  Cleanse daily with NS and apply non-adherent Vaseline gauze.  The encounter with the patient was in whole, or in part, for the following medical condition, which is the primary reason for home health care:  Bilateral leg wounds  I certify that, based on my findings, the following services are medically necessary home health services:  Nursing  Reason for Medically Necessary Home Health Services:  Skilled Nursing- Complex Wound Care  My clinical findings support the need for the above services:  Unable to leave home safely without assistance and/or assistive device  Further, I certify that my clinical findings support that this patient is homebound due to:  Unable to leave  home safely without assistance     Home Health    Complete by:  As directed   To provide the following care/treatments:  RN     Increase activity slowly    Complete by:  As directed             Medication List    STOP taking these medications        ciprofloxacin 500 MG tablet  Commonly known as:  CIPRO     doxycycline 100 MG tablet  Commonly known as:  VIBRA-TABS     HYDROcodone-acetaminophen 5-325 MG tablet  Commonly known as:  NORCO/VICODIN      TAKE these medications  acetaminophen 325 MG tablet  Commonly known as:  TYLENOL  Take 2 tablets (650 mg total) by mouth every 6 (six) hours as needed for mild pain or fever.     amLODipine 5 MG tablet  Commonly known as:  NORVASC  Take 5 mg by mouth daily.     CENTRUM ADULTS PO  Take 1 tablet by mouth daily.     clobetasol cream 0.05 %  Commonly known as:  TEMOVATE  Apply 1 application topically daily.     colchicine 0.6 MG tablet  Take 1 tablet (0.6 mg total) by mouth 2 (two) times daily.     feeding supplement (ENSURE ENLIVE) Liqd  Take 237 mLs by mouth 2 (two) times daily between meals.     hydrochlorothiazide 25 MG tablet  Commonly known as:  HYDRODIURIL  Take 25 mg by mouth daily.     oxyCODONE 5 MG immediate release tablet  Commonly known as:  Oxy IR/ROXICODONE  Take 1-2 tablets (5-10 mg total) by mouth every 4 (four) hours as needed for moderate pain.     polyethylene glycol packet  Commonly known as:  MIRALAX / GLYCOLAX  Take 17 g by mouth daily as needed for mild constipation.     predniSONE 20 MG tablet  Commonly known as:  DELTASONE  Take 2 tablets (40 mg total) by mouth daily with breakfast.     SSKI 1 GM/ML solution  Generic drug:  potassium iodide  Take by mouth 3 (three) times daily. Started at 5 drops, then increase 1 drop daily till patient reaches 15 drops. Pt is up to 9 drops daily as of 01/28/16     tacrolimus 0.1 % ointment  Commonly known as:  PROTOPIC  Apply topically 2  (two) times daily.           Follow-up Information    Follow up with Levy Sjogren, MD On 02/13/2016.   Specialty:  Dermatology   Why:  2 p.m.   Contact information:   [address] [address] [phone]        Time coordinating discharge: 35 minutes.  Signed:  Ethelene Closser  Pager [phone] [facility] 02/05/2016, 2:32 PM

## 2017-01-02 IMAGING — MG MM DIGITAL DIAGNOSTIC UNILAT*L*
2 series · 2 of 2 positions shown · non-contrast
Comparison: Previous exam(s).

CLINICAL DATA: 63-year-old female status post stereotactic guided
left breast biopsy

EXAM:
DIAGNOSTIC LEFT MAMMOGRAM POST STEREOTACTIC BIOPSY

[L CC]
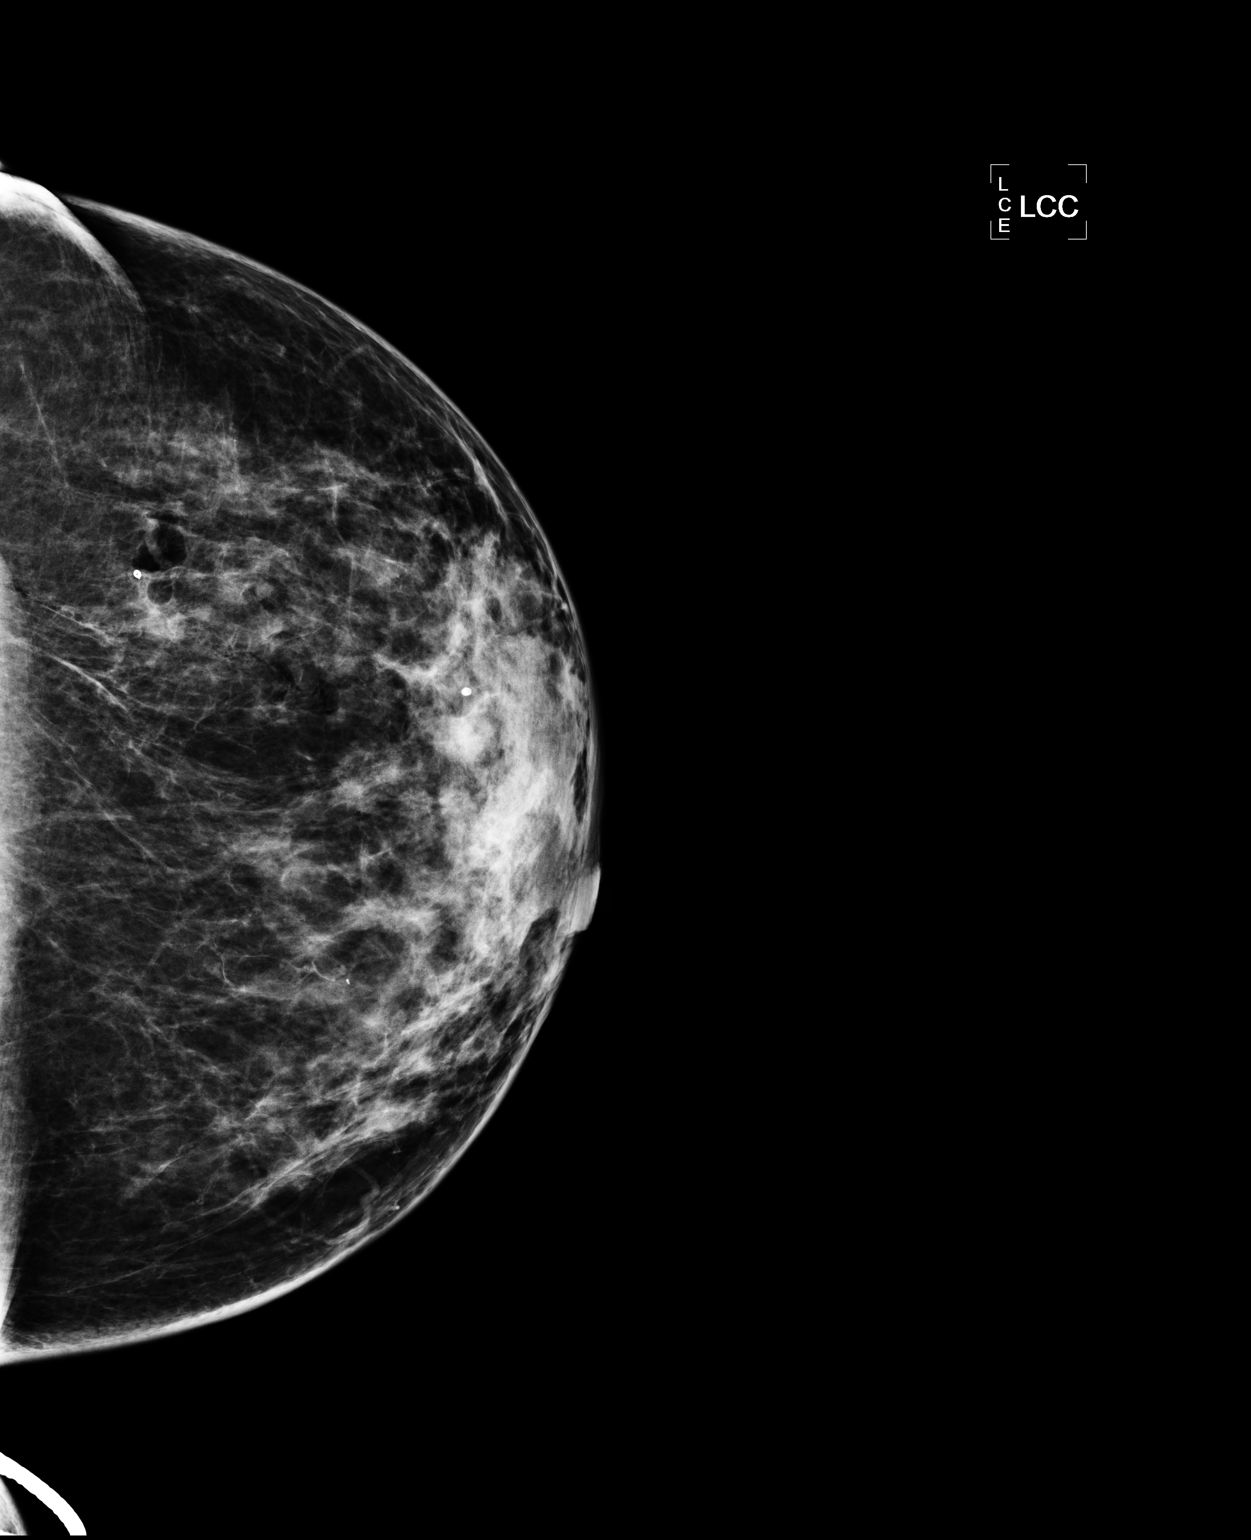

[L ML]
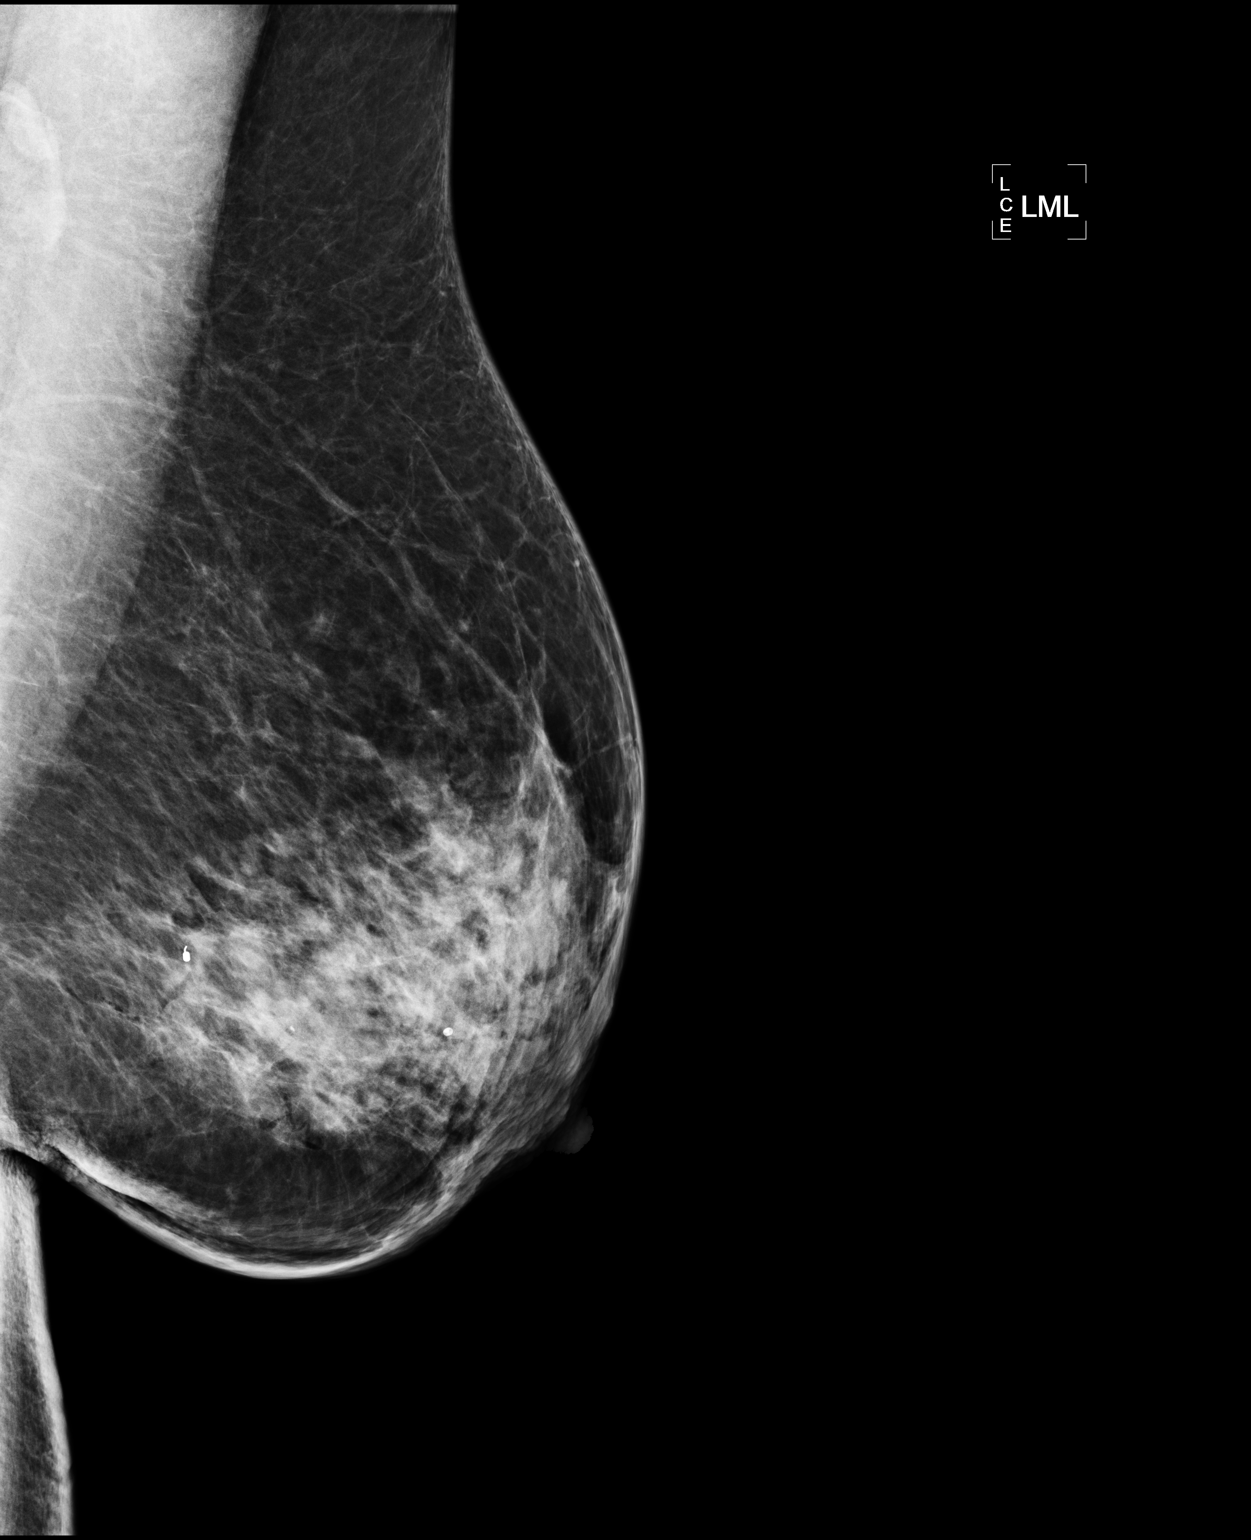

[2 of 2 positions shown; findings below may reference images not displayed]

FINDINGS: Mammographic images were obtained following stereotactic guided
biopsy of indeterminate left breast calcifications. Post biopsy
images confirm accurate placement of a coil clip within the lower,
outer left breast.
IMPRESSION: Satisfactory placement of a coil clip post stereotactic guided left
breast biopsy.

Final Assessment: Post Procedure Mammograms for Marker Placement

## 2017-01-27 ENCOUNTER — Encounter (INDEPENDENT_AMBULATORY_CARE_PROVIDER_SITE_OTHER): Payer: BLUE CROSS/BLUE SHIELD | Admitting: Ophthalmology

## 2017-01-27 DIAGNOSIS — I10 Essential (primary) hypertension: Secondary | ICD-10-CM | POA: Diagnosis not present

## 2017-01-27 DIAGNOSIS — H35033 Hypertensive retinopathy, bilateral: Secondary | ICD-10-CM | POA: Diagnosis not present

## 2017-01-27 DIAGNOSIS — H43813 Vitreous degeneration, bilateral: Secondary | ICD-10-CM | POA: Diagnosis not present

## 2017-01-27 DIAGNOSIS — H34812 Central retinal vein occlusion, left eye, with macular edema: Secondary | ICD-10-CM | POA: Diagnosis not present

## 2017-01-27 DIAGNOSIS — H2512 Age-related nuclear cataract, left eye: Secondary | ICD-10-CM

## 2017-02-24 ENCOUNTER — Encounter (INDEPENDENT_AMBULATORY_CARE_PROVIDER_SITE_OTHER): Payer: BLUE CROSS/BLUE SHIELD | Admitting: Ophthalmology

## 2017-02-24 DIAGNOSIS — H2513 Age-related nuclear cataract, bilateral: Secondary | ICD-10-CM | POA: Diagnosis not present

## 2017-02-24 DIAGNOSIS — H34812 Central retinal vein occlusion, left eye, with macular edema: Secondary | ICD-10-CM | POA: Diagnosis not present

## 2017-02-24 DIAGNOSIS — H35033 Hypertensive retinopathy, bilateral: Secondary | ICD-10-CM

## 2017-02-24 DIAGNOSIS — I10 Essential (primary) hypertension: Secondary | ICD-10-CM

## 2017-02-24 DIAGNOSIS — H43813 Vitreous degeneration, bilateral: Secondary | ICD-10-CM

## 2017-03-24 ENCOUNTER — Encounter (INDEPENDENT_AMBULATORY_CARE_PROVIDER_SITE_OTHER): Payer: BLUE CROSS/BLUE SHIELD | Admitting: Ophthalmology

## 2017-03-24 DIAGNOSIS — H34812 Central retinal vein occlusion, left eye, with macular edema: Secondary | ICD-10-CM

## 2017-03-24 DIAGNOSIS — I10 Essential (primary) hypertension: Secondary | ICD-10-CM

## 2017-03-24 DIAGNOSIS — H35033 Hypertensive retinopathy, bilateral: Secondary | ICD-10-CM

## 2017-03-24 DIAGNOSIS — H43813 Vitreous degeneration, bilateral: Secondary | ICD-10-CM | POA: Diagnosis not present

## 2017-04-21 ENCOUNTER — Encounter (INDEPENDENT_AMBULATORY_CARE_PROVIDER_SITE_OTHER): Payer: BLUE CROSS/BLUE SHIELD | Admitting: Ophthalmology

## 2017-04-21 DIAGNOSIS — H43813 Vitreous degeneration, bilateral: Secondary | ICD-10-CM | POA: Diagnosis not present

## 2017-04-21 DIAGNOSIS — H4312 Vitreous hemorrhage, left eye: Secondary | ICD-10-CM

## 2017-04-21 DIAGNOSIS — H34812 Central retinal vein occlusion, left eye, with macular edema: Secondary | ICD-10-CM | POA: Diagnosis not present

## 2017-04-21 DIAGNOSIS — I10 Essential (primary) hypertension: Secondary | ICD-10-CM | POA: Diagnosis not present

## 2017-04-21 DIAGNOSIS — H35033 Hypertensive retinopathy, bilateral: Secondary | ICD-10-CM

## 2017-05-14 ENCOUNTER — Encounter (INDEPENDENT_AMBULATORY_CARE_PROVIDER_SITE_OTHER): Payer: BLUE CROSS/BLUE SHIELD | Admitting: Ophthalmology

## 2017-05-14 DIAGNOSIS — H35033 Hypertensive retinopathy, bilateral: Secondary | ICD-10-CM | POA: Diagnosis not present

## 2017-05-14 DIAGNOSIS — H43813 Vitreous degeneration, bilateral: Secondary | ICD-10-CM | POA: Diagnosis not present

## 2017-05-14 DIAGNOSIS — H34812 Central retinal vein occlusion, left eye, with macular edema: Secondary | ICD-10-CM | POA: Diagnosis not present

## 2017-05-14 DIAGNOSIS — H2513 Age-related nuclear cataract, bilateral: Secondary | ICD-10-CM

## 2017-05-14 DIAGNOSIS — I10 Essential (primary) hypertension: Secondary | ICD-10-CM | POA: Diagnosis not present

## 2017-06-11 ENCOUNTER — Encounter (INDEPENDENT_AMBULATORY_CARE_PROVIDER_SITE_OTHER): Payer: BLUE CROSS/BLUE SHIELD | Admitting: Ophthalmology

## 2017-06-11 DIAGNOSIS — H43813 Vitreous degeneration, bilateral: Secondary | ICD-10-CM | POA: Diagnosis not present

## 2017-06-11 DIAGNOSIS — H35033 Hypertensive retinopathy, bilateral: Secondary | ICD-10-CM

## 2017-06-11 DIAGNOSIS — H2513 Age-related nuclear cataract, bilateral: Secondary | ICD-10-CM | POA: Diagnosis not present

## 2017-06-11 DIAGNOSIS — I10 Essential (primary) hypertension: Secondary | ICD-10-CM

## 2017-06-11 DIAGNOSIS — H34812 Central retinal vein occlusion, left eye, with macular edema: Secondary | ICD-10-CM | POA: Diagnosis not present

## 2017-06-29 ENCOUNTER — Encounter (HOSPITAL_COMMUNITY): Payer: Self-pay | Admitting: Emergency Medicine

## 2017-06-29 ENCOUNTER — Encounter (HOSPITAL_COMMUNITY): Admission: EM | Disposition: A | Discharge status: Home Health | Payer: Self-pay | Source: Home / Self Care

## 2017-06-29 ENCOUNTER — Inpatient Hospital Stay (HOSPITAL_COMMUNITY): Payer: BLUE CROSS/BLUE SHIELD | Admitting: Certified Registered"

## 2017-06-29 ENCOUNTER — Inpatient Hospital Stay (HOSPITAL_COMMUNITY): Payer: BLUE CROSS/BLUE SHIELD

## 2017-06-29 ENCOUNTER — Inpatient Hospital Stay (HOSPITAL_COMMUNITY)
Admission: EM | Admit: 2017-06-29 | Discharge: 2017-07-16 | DRG: 854 | Disposition: A | Discharge status: Home Health | Payer: BLUE CROSS/BLUE SHIELD | Attending: Surgery | Admitting: Surgery

## 2017-06-29 ENCOUNTER — Emergency Department (HOSPITAL_COMMUNITY): Payer: BLUE CROSS/BLUE SHIELD

## 2017-06-29 DIAGNOSIS — A419 Sepsis, unspecified organism: Secondary | ICD-10-CM

## 2017-06-29 DIAGNOSIS — I890 Lymphedema, not elsewhere classified: Secondary | ICD-10-CM | POA: Diagnosis present

## 2017-06-29 DIAGNOSIS — L97919 Non-pressure chronic ulcer of unspecified part of right lower leg with unspecified severity: Secondary | ICD-10-CM | POA: Diagnosis present

## 2017-06-29 DIAGNOSIS — E039 Hypothyroidism, unspecified: Secondary | ICD-10-CM | POA: Diagnosis present

## 2017-06-29 DIAGNOSIS — K5792 Diverticulitis of intestine, part unspecified, without perforation or abscess without bleeding: Secondary | ICD-10-CM | POA: Diagnosis present

## 2017-06-29 DIAGNOSIS — K219 Gastro-esophageal reflux disease without esophagitis: Secondary | ICD-10-CM | POA: Diagnosis present

## 2017-06-29 DIAGNOSIS — Z8249 Family history of ischemic heart disease and other diseases of the circulatory system: Secondary | ICD-10-CM

## 2017-06-29 DIAGNOSIS — Z9889 Other specified postprocedural states: Secondary | ICD-10-CM

## 2017-06-29 DIAGNOSIS — Z6834 Body mass index (BMI) 34.0-34.9, adult: Secondary | ICD-10-CM | POA: Diagnosis not present

## 2017-06-29 DIAGNOSIS — D62 Acute posthemorrhagic anemia: Secondary | ICD-10-CM | POA: Diagnosis not present

## 2017-06-29 DIAGNOSIS — N3289 Other specified disorders of bladder: Secondary | ICD-10-CM | POA: Diagnosis not present

## 2017-06-29 DIAGNOSIS — Z881 Allergy status to other antibiotic agents status: Secondary | ICD-10-CM | POA: Diagnosis not present

## 2017-06-29 DIAGNOSIS — E669 Obesity, unspecified: Secondary | ICD-10-CM | POA: Diagnosis present

## 2017-06-29 DIAGNOSIS — I340 Nonrheumatic mitral (valve) insufficiency: Secondary | ICD-10-CM

## 2017-06-29 DIAGNOSIS — N179 Acute kidney failure, unspecified: Secondary | ICD-10-CM | POA: Diagnosis present

## 2017-06-29 DIAGNOSIS — B377 Candidal sepsis: Secondary | ICD-10-CM | POA: Diagnosis present

## 2017-06-29 DIAGNOSIS — I10 Essential (primary) hypertension: Secondary | ICD-10-CM | POA: Diagnosis present

## 2017-06-29 DIAGNOSIS — E46 Unspecified protein-calorie malnutrition: Secondary | ICD-10-CM | POA: Diagnosis present

## 2017-06-29 DIAGNOSIS — K630 Abscess of intestine: Secondary | ICD-10-CM

## 2017-06-29 DIAGNOSIS — I96 Gangrene, not elsewhere classified: Secondary | ICD-10-CM | POA: Diagnosis present

## 2017-06-29 DIAGNOSIS — G9340 Encephalopathy, unspecified: Secondary | ICD-10-CM | POA: Diagnosis not present

## 2017-06-29 DIAGNOSIS — L52 Erythema nodosum: Secondary | ICD-10-CM | POA: Diagnosis not present

## 2017-06-29 DIAGNOSIS — R7989 Other specified abnormal findings of blood chemistry: Secondary | ICD-10-CM

## 2017-06-29 DIAGNOSIS — L97929 Non-pressure chronic ulcer of unspecified part of left lower leg with unspecified severity: Secondary | ICD-10-CM | POA: Diagnosis present

## 2017-06-29 DIAGNOSIS — I351 Nonrheumatic aortic (valve) insufficiency: Secondary | ICD-10-CM | POA: Diagnosis not present

## 2017-06-29 DIAGNOSIS — K5720 Diverticulitis of large intestine with perforation and abscess without bleeding: Secondary | ICD-10-CM | POA: Diagnosis present

## 2017-06-29 DIAGNOSIS — B9620 Unspecified Escherichia coli [E. coli] as the cause of diseases classified elsewhere: Secondary | ICD-10-CM | POA: Diagnosis present

## 2017-06-29 DIAGNOSIS — Z452 Encounter for adjustment and management of vascular access device: Secondary | ICD-10-CM

## 2017-06-29 HISTORY — PX: LAPAROTOMY: SHX154

## 2017-06-29 HISTORY — PX: COLOSTOMY: SHX63

## 2017-06-29 LAB — CBC
HCT: 34.2 % — ABNORMAL LOW (ref 36.0–46.0)
Hemoglobin: 10.8 g/dL — ABNORMAL LOW (ref 12.0–15.0)
MCH: 25.2 pg — ABNORMAL LOW (ref 26.0–34.0)
MCHC: 31.6 g/dL (ref 30.0–36.0)
MCV: 79.9 fL (ref 78.0–100.0)
Platelets: 465 10*3/uL — ABNORMAL HIGH (ref 150–400)
RBC: 4.28 MIL/uL (ref 3.87–5.11)
RDW: 16.5 % — ABNORMAL HIGH (ref 11.5–15.5)
WBC: 33.7 10*3/uL — ABNORMAL HIGH (ref 4.0–10.5)

## 2017-06-29 LAB — I-STAT CG4 LACTIC ACID, ED
Lactic Acid, Venous: 6.44 mmol/L (ref 0.5–1.9)
Lactic Acid, Venous: 4.07 mmol/L (ref 0.5–1.9)

## 2017-06-29 LAB — COMPREHENSIVE METABOLIC PANEL
ALT: 44 U/L (ref 14–54)
AST: 72 U/L — ABNORMAL HIGH (ref 15–41)
Albumin: 2.3 g/dL — ABNORMAL LOW (ref 3.5–5.0)
Alkaline Phosphatase: 256 U/L — ABNORMAL HIGH (ref 38–126)
Anion gap: 18 — ABNORMAL HIGH (ref 5–15)
BUN: 21 mg/dL — ABNORMAL HIGH (ref 6–20)
CO2: 22 mmol/L (ref 22–32)
Calcium: 8.2 mg/dL — ABNORMAL LOW (ref 8.9–10.3)
Chloride: 99 mmol/L — ABNORMAL LOW (ref 101–111)
Creatinine, Ser: 2.11 mg/dL — ABNORMAL HIGH (ref 0.44–1.00)
GFR calc Af Amer: 27 mL/min — ABNORMAL LOW (ref >60)
GFR calc non Af Amer: 24 mL/min — ABNORMAL LOW (ref >60)
Glucose, Bld: 130 mg/dL — ABNORMAL HIGH (ref 65–99)
Potassium: 3.4 mmol/L — ABNORMAL LOW (ref 3.5–5.1)
Sodium: 139 mmol/L (ref 135–145)
Total Bilirubin: 1.4 mg/dL — ABNORMAL HIGH (ref 0.3–1.2)
Total Protein: 6.6 g/dL (ref 6.5–8.1)

## 2017-06-29 LAB — URINALYSIS, ROUTINE W REFLEX MICROSCOPIC
Bilirubin Urine: NEGATIVE
Glucose, UA: NEGATIVE mg/dL
Hgb urine dipstick: NEGATIVE
Ketones, ur: NEGATIVE mg/dL
Leukocytes, UA: NEGATIVE
Nitrite: NEGATIVE
Protein, ur: 30 mg/dL — AB
Specific Gravity, Urine: 1.016 (ref 1.005–1.030)
Squamous Epithelial / LPF: NONE SEEN
pH: 5 (ref 5.0–8.0)

## 2017-06-29 LAB — TYPE AND SCREEN
ABO/RH(D): O POS
Antibody Screen: NEGATIVE

## 2017-06-29 LAB — ABO/RH: ABO/RH(D): O POS

## 2017-06-29 LAB — LIPASE, BLOOD: Lipase: 22 U/L (ref 11–51)

## 2017-06-29 LAB — LACTIC ACID, PLASMA: Lactic Acid, Venous: 1.7 mmol/L (ref 0.5–1.9)

## 2017-06-29 SURGERY — LAPAROTOMY, EXPLORATORY
Anesthesia: General | Site: Abdomen

## 2017-06-29 MED ORDER — SUCCINYLCHOLINE CHLORIDE 200 MG/10ML IV SOSY
PREFILLED_SYRINGE | INTRAVENOUS | Status: DC | PRN
  Administered 2017-06-29: 160 mg via INTRAVENOUS

## 2017-06-29 MED ORDER — FENTANYL CITRATE (PF) 250 MCG/5ML IJ SOLN
INTRAMUSCULAR | Status: AC
  Filled 2017-06-29: qty 5

## 2017-06-29 MED ORDER — ACETAMINOPHEN 500 MG PO TABS
1000.0000 mg | ORAL_TABLET | Freq: Four times a day (QID) | ORAL | Status: AC
  Administered 2017-06-30 (×4): 1000 mg via ORAL
  Filled 2017-06-29 (×4): qty 2

## 2017-06-29 MED ORDER — ROCURONIUM BROMIDE 50 MG/5ML IV SOLN
INTRAVENOUS | Status: AC
  Filled 2017-06-29: qty 1

## 2017-06-29 MED ORDER — SODIUM CHLORIDE 0.9 % IV SOLN: Freq: Once | INTRAVENOUS | Status: DC

## 2017-06-29 MED ORDER — SUGAMMADEX SODIUM 200 MG/2ML IV SOLN
INTRAVENOUS | Status: AC
  Filled 2017-06-29: qty 2

## 2017-06-29 MED ORDER — SODIUM CHLORIDE 0.9 % IV BOLUS (SEPSIS)
1000.0000 mL | Freq: Once | INTRAVENOUS | Status: AC
  Administered 2017-06-29: 1000 mL via INTRAVENOUS

## 2017-06-29 MED ORDER — HYDROMORPHONE 1 MG/ML IV SOLN
INTRAVENOUS | Status: DC
  Administered 2017-06-29: 20:00:00 via INTRAVENOUS

## 2017-06-29 MED ORDER — PHENYLEPHRINE 40 MCG/ML (10ML) SYRINGE FOR IV PUSH (FOR BLOOD PRESSURE SUPPORT)
PREFILLED_SYRINGE | INTRAVENOUS | Status: AC
  Filled 2017-06-29: qty 10

## 2017-06-29 MED ORDER — ALBUMIN HUMAN 5 % IV SOLN
INTRAVENOUS | Status: DC | PRN
  Administered 2017-06-29 (×4): via INTRAVENOUS

## 2017-06-29 MED ORDER — HYDROMORPHONE HCL 1 MG/ML IJ SOLN
1.0000 mg | INTRAMUSCULAR | Status: DC | PRN
  Administered 2017-06-29: 1 mg via INTRAVENOUS
  Filled 2017-06-29: qty 1

## 2017-06-29 MED ORDER — NALOXONE HCL 0.4 MG/ML IJ SOLN: 0.4000 mg | INTRAMUSCULAR | Status: DC | PRN

## 2017-06-29 MED ORDER — CIPROFLOXACIN IN D5W 400 MG/200ML IV SOLN
400.0000 mg | Freq: Two times a day (BID) | INTRAVENOUS | Status: DC
  Administered 2017-06-30 (×2): 400 mg via INTRAVENOUS
  Filled 2017-06-29 (×2): qty 200

## 2017-06-29 MED ORDER — ONDANSETRON HCL 4 MG/2ML IJ SOLN
4.0000 mg | Freq: Once | INTRAMUSCULAR | Status: AC
  Administered 2017-06-29: 4 mg via INTRAVENOUS
  Filled 2017-06-29: qty 2

## 2017-06-29 MED ORDER — IOPAMIDOL (ISOVUE-300) INJECTION 61%
INTRAVENOUS | Status: AC
  Filled 2017-06-29: qty 100

## 2017-06-29 MED ORDER — PROMETHAZINE HCL 25 MG/ML IJ SOLN: 6.2500 mg | INTRAMUSCULAR | Status: DC | PRN

## 2017-06-29 MED ORDER — ACETAMINOPHEN 325 MG PO TABS
650.0000 mg | ORAL_TABLET | Freq: Four times a day (QID) | ORAL | Status: DC | PRN
  Administered 2017-07-05 – 2017-07-11 (×3): 650 mg via ORAL
  Filled 2017-06-29 (×4): qty 2

## 2017-06-29 MED ORDER — ROCURONIUM BROMIDE 10 MG/ML (PF) SYRINGE
PREFILLED_SYRINGE | INTRAVENOUS | Status: DC | PRN
  Administered 2017-06-29: 50 mg via INTRAVENOUS
  Administered 2017-06-29: 10 mg via INTRAVENOUS
  Administered 2017-06-29: 5 mg via INTRAVENOUS
  Administered 2017-06-29: 10 mg via INTRAVENOUS

## 2017-06-29 MED ORDER — MEPERIDINE HCL 25 MG/ML IJ SOLN: 6.2500 mg | INTRAMUSCULAR | Status: DC | PRN

## 2017-06-29 MED ORDER — SODIUM CHLORIDE 0.9% FLUSH: 9.0000 mL | INTRAVENOUS | Status: DC | PRN

## 2017-06-29 MED ORDER — 0.9 % SODIUM CHLORIDE (POUR BTL) OPTIME
TOPICAL | Status: DC | PRN
  Administered 2017-06-29: 1000 mL

## 2017-06-29 MED ORDER — DIPHENHYDRAMINE HCL 12.5 MG/5ML PO ELIX: 12.5000 mg | ORAL_SOLUTION | Freq: Four times a day (QID) | ORAL | Status: DC | PRN

## 2017-06-29 MED ORDER — DIPHENHYDRAMINE HCL 50 MG/ML IJ SOLN: 12.5000 mg | Freq: Four times a day (QID) | INTRAMUSCULAR | Status: DC | PRN

## 2017-06-29 MED ORDER — SODIUM CHLORIDE 0.9 % IV BOLUS (SEPSIS): 1000.0000 mL | Freq: Once | INTRAVENOUS | Status: DC

## 2017-06-29 MED ORDER — ACETAMINOPHEN 650 MG RE SUPP
650.0000 mg | Freq: Once | RECTAL | Status: AC
  Administered 2017-06-29: 650 mg via RECTAL
  Filled 2017-06-29: qty 1

## 2017-06-29 MED ORDER — PANTOPRAZOLE SODIUM 40 MG IV SOLR
40.0000 mg | Freq: Every day | INTRAVENOUS | Status: DC
  Administered 2017-06-30 – 2017-07-03 (×5): 40 mg via INTRAVENOUS
  Filled 2017-06-29 (×5): qty 40

## 2017-06-29 MED ORDER — LACTATED RINGERS IV SOLN
INTRAVENOUS | Status: DC
  Administered 2017-06-30: 01:00:00 via INTRAVENOUS

## 2017-06-29 MED ORDER — PHENYLEPHRINE HCL 10 MG/ML IJ SOLN
INTRAMUSCULAR | Status: DC | PRN
  Administered 2017-06-29: 17:00:00 via INTRAVENOUS
  Administered 2017-06-29: 25 ug/min via INTRAVENOUS

## 2017-06-29 MED ORDER — HYDROMORPHONE HCL 1 MG/ML IJ SOLN
INTRAMUSCULAR | Status: AC
  Administered 2017-06-29: 0.5 mg via INTRAVENOUS
  Filled 2017-06-29: qty 1

## 2017-06-29 MED ORDER — METRONIDAZOLE IN NACL 5-0.79 MG/ML-% IV SOLN
500.0000 mg | Freq: Three times a day (TID) | INTRAVENOUS | Status: DC
  Administered 2017-06-29 – 2017-06-30 (×3): 500 mg via INTRAVENOUS
  Filled 2017-06-29 (×3): qty 100

## 2017-06-29 MED ORDER — LIDOCAINE 2% (20 MG/ML) 5 ML SYRINGE
INTRAMUSCULAR | Status: AC
  Filled 2017-06-29: qty 5

## 2017-06-29 MED ORDER — ONDANSETRON HCL 4 MG/2ML IJ SOLN: 4.0000 mg | Freq: Four times a day (QID) | INTRAMUSCULAR | Status: DC | PRN

## 2017-06-29 MED ORDER — POTASSIUM CHLORIDE IN NACL 20-0.45 MEQ/L-% IV SOLN
INTRAVENOUS | Status: AC
  Administered 2017-06-29 – 2017-07-09 (×13): via INTRAVENOUS
  Filled 2017-06-29 (×22): qty 1000

## 2017-06-29 MED ORDER — ONDANSETRON HCL 4 MG/2ML IJ SOLN
INTRAMUSCULAR | Status: DC | PRN
  Administered 2017-06-29: 4 mg via INTRAVENOUS

## 2017-06-29 MED ORDER — CIPROFLOXACIN IN D5W 400 MG/200ML IV SOLN
400.0000 mg | Freq: Two times a day (BID) | INTRAVENOUS | Status: DC
  Administered 2017-06-29: 400 mg via INTRAVENOUS

## 2017-06-29 MED ORDER — HYDROMORPHONE HCL 1 MG/ML IJ SOLN
0.2500 mg | INTRAMUSCULAR | Status: DC | PRN
  Administered 2017-06-29 (×4): 0.5 mg via INTRAVENOUS

## 2017-06-29 MED ORDER — HYDRALAZINE HCL 20 MG/ML IJ SOLN: 10.0000 mg | INTRAMUSCULAR | Status: DC | PRN

## 2017-06-29 MED ORDER — LACTATED RINGERS IV SOLN
INTRAVENOUS | Status: DC | PRN
  Administered 2017-06-29: 16:00:00 via INTRAVENOUS

## 2017-06-29 MED ORDER — CALCIUM CHLORIDE 10 % IV SOLN
INTRAVENOUS | Status: DC | PRN
  Administered 2017-06-29: 300 mg via INTRAVENOUS
  Administered 2017-06-29 (×2): 200 mg via INTRAVENOUS
  Administered 2017-06-29: 300 mg via INTRAVENOUS

## 2017-06-29 MED ORDER — SUCCINYLCHOLINE CHLORIDE 200 MG/10ML IV SOSY
PREFILLED_SYRINGE | INTRAVENOUS | Status: AC
  Filled 2017-06-29: qty 10

## 2017-06-29 MED ORDER — FENTANYL CITRATE (PF) 100 MCG/2ML IJ SOLN
50.0000 ug | Freq: Once | INTRAMUSCULAR | Status: AC
  Administered 2017-06-29: 50 ug via INTRAVENOUS
  Filled 2017-06-29: qty 2

## 2017-06-29 MED ORDER — LACTATED RINGERS IV SOLN
INTRAVENOUS | Status: DC | PRN
  Administered 2017-06-29: 15:00:00 via INTRAVENOUS

## 2017-06-29 MED ORDER — MIDAZOLAM HCL 5 MG/5ML IJ SOLN
INTRAMUSCULAR | Status: DC | PRN
  Administered 2017-06-29: 2 mg via INTRAVENOUS

## 2017-06-29 MED ORDER — PHENYLEPHRINE 40 MCG/ML (10ML) SYRINGE FOR IV PUSH (FOR BLOOD PRESSURE SUPPORT)
PREFILLED_SYRINGE | INTRAVENOUS | Status: DC | PRN
  Administered 2017-06-29: 80 ug via INTRAVENOUS
  Administered 2017-06-29: 120 ug via INTRAVENOUS

## 2017-06-29 MED ORDER — ACETAMINOPHEN 650 MG RE SUPP: 650.0000 mg | Freq: Four times a day (QID) | RECTAL | Status: DC | PRN

## 2017-06-29 MED ORDER — MIDAZOLAM HCL 2 MG/2ML IJ SOLN
INTRAMUSCULAR | Status: AC
Start: 2017-06-29 — End: 2017-06-29
  Filled 2017-06-29: qty 2

## 2017-06-29 MED ORDER — LACTATED RINGERS IV SOLN: INTRAVENOUS | Status: DC

## 2017-06-29 MED ORDER — ONDANSETRON 4 MG PO TBDP: 4.0000 mg | ORAL_TABLET | Freq: Four times a day (QID) | ORAL | Status: DC | PRN

## 2017-06-29 MED ORDER — METRONIDAZOLE IN NACL 5-0.79 MG/ML-% IV SOLN
500.0000 mg | Freq: Three times a day (TID) | INTRAVENOUS | Status: DC
  Administered 2017-06-29: 500 mg via INTRAVENOUS

## 2017-06-29 MED ORDER — SUGAMMADEX SODIUM 200 MG/2ML IV SOLN
INTRAVENOUS | Status: DC | PRN
  Administered 2017-06-29: 200 mg via INTRAVENOUS

## 2017-06-29 MED ORDER — PROPOFOL 10 MG/ML IV BOLUS
INTRAVENOUS | Status: DC | PRN
  Administered 2017-06-29: 90 mg via INTRAVENOUS

## 2017-06-29 MED ORDER — HYDROMORPHONE 1 MG/ML IV SOLN
INTRAVENOUS | Status: AC
  Filled 2017-06-29: qty 25

## 2017-06-29 MED ORDER — ONDANSETRON HCL 4 MG/2ML IJ SOLN
INTRAMUSCULAR | Status: AC
  Filled 2017-06-29: qty 4

## 2017-06-29 MED ORDER — PROPOFOL 10 MG/ML IV BOLUS
INTRAVENOUS | Status: AC
  Filled 2017-06-29: qty 20

## 2017-06-29 MED ORDER — FENTANYL CITRATE (PF) 100 MCG/2ML IJ SOLN
INTRAMUSCULAR | Status: DC | PRN
  Administered 2017-06-29: 25 ug via INTRAVENOUS
  Administered 2017-06-29 (×2): 50 ug via INTRAVENOUS
  Administered 2017-06-29: 25 ug via INTRAVENOUS

## 2017-06-29 MED ORDER — DEXAMETHASONE SODIUM PHOSPHATE 10 MG/ML IJ SOLN
INTRAMUSCULAR | Status: AC
  Filled 2017-06-29: qty 1

## 2017-06-29 MED ORDER — DEXAMETHASONE SODIUM PHOSPHATE 10 MG/ML IJ SOLN
INTRAMUSCULAR | Status: DC | PRN
  Administered 2017-06-29: 10 mg via INTRAVENOUS

## 2017-06-29 SURGICAL SUPPLY — 63 items
BLADE CLIPPER SURG (BLADE) IMPLANT
BNDG GAUZE ELAST 4 BULKY (GAUZE/BANDAGES/DRESSINGS) ×2 IMPLANT
CANISTER SUCT 3000ML PPV (MISCELLANEOUS) ×2 IMPLANT
CHLORAPREP W/TINT 26ML (MISCELLANEOUS) ×2 IMPLANT
COVER SURGICAL LIGHT HANDLE (MISCELLANEOUS) ×2 IMPLANT
DRAIN CHANNEL 19F RND (DRAIN) ×2 IMPLANT
DRAPE LAPAROSCOPIC ABDOMINAL (DRAPES) ×2 IMPLANT
DRAPE WARM FLUID 44X44 (DRAPE) ×2 IMPLANT
DRSG PAD ABDOMINAL 8X10 ST (GAUZE/BANDAGES/DRESSINGS) ×4 IMPLANT
ELECT BLADE 4.0 EZ CLEAN MEGAD (MISCELLANEOUS) ×2
ELECT BLADE 6.5 EXT (BLADE) ×2 IMPLANT
ELECT CAUTERY BLADE 6.4 (BLADE) ×2 IMPLANT
ELECT REM PT RETURN 9FT ADLT (ELECTROSURGICAL) ×2
ELECTRODE BLDE 4.0 EZ CLN MEGD (MISCELLANEOUS) ×1 IMPLANT
ELECTRODE REM PT RTRN 9FT ADLT (ELECTROSURGICAL) ×1 IMPLANT
GAUZE SPONGE 4X4 12PLY STRL (GAUZE/BANDAGES/DRESSINGS) ×2 IMPLANT
GLOVE BIO SURGEON STRL SZ7.5 (GLOVE) ×2 IMPLANT
GLOVE BIOGEL PI IND STRL 6 (GLOVE) ×1 IMPLANT
GLOVE BIOGEL PI IND STRL 6.5 (GLOVE) ×2 IMPLANT
GLOVE BIOGEL PI IND STRL 7.0 (GLOVE) ×1 IMPLANT
GLOVE BIOGEL PI IND STRL 8 (GLOVE) ×1 IMPLANT
GLOVE BIOGEL PI INDICATOR 6 (GLOVE) ×1
GLOVE BIOGEL PI INDICATOR 6.5 (GLOVE) ×2
GLOVE BIOGEL PI INDICATOR 7.0 (GLOVE) ×1
GLOVE BIOGEL PI INDICATOR 8 (GLOVE) ×1
GLOVE ECLIPSE 6.0 STRL STRAW (GLOVE) ×2 IMPLANT
GLOVE ECLIPSE 7.0 STRL STRAW (GLOVE) ×2 IMPLANT
GLOVE SURG SIGNA 7.5 PF LTX (GLOVE) ×2 IMPLANT
GLOVE SURG SS PI 6.0 STRL IVOR (GLOVE) ×2 IMPLANT
GOWN STRL REUS W/ TWL LRG LVL3 (GOWN DISPOSABLE) ×3 IMPLANT
GOWN STRL REUS W/ TWL XL LVL3 (GOWN DISPOSABLE) ×2 IMPLANT
GOWN STRL REUS W/TWL LRG LVL3 (GOWN DISPOSABLE) ×6
GOWN STRL REUS W/TWL XL LVL3 (GOWN DISPOSABLE) ×4
KIT BASIN OR (CUSTOM PROCEDURE TRAY) ×2 IMPLANT
KIT OSTOMY DRAINABLE 2.75 STR (WOUND CARE) ×2 IMPLANT
KIT ROOM TURNOVER OR (KITS) ×2 IMPLANT
LIGASURE IMPACT 36 18CM CVD LR (INSTRUMENTS) IMPLANT
NS IRRIG 1000ML POUR BTL (IV SOLUTION) ×4 IMPLANT
PACK GENERAL/GYN (CUSTOM PROCEDURE TRAY) ×2 IMPLANT
PAD ARMBOARD 7.5X6 YLW CONV (MISCELLANEOUS) ×2 IMPLANT
RELOAD LINEAR CUT PROX 55 BLUE (ENDOMECHANICALS) ×2 IMPLANT
SPECIMEN JAR LARGE (MISCELLANEOUS) IMPLANT
SPECIMEN JAR SMALL (MISCELLANEOUS) ×2 IMPLANT
SPONGE LAP 18X18 X RAY DECT (DISPOSABLE) ×4 IMPLANT
STAPLER CUT CVD 40MM BLUE (STAPLE) ×2 IMPLANT
STAPLER CUT RELOAD GREEN (STAPLE) ×2 IMPLANT
STAPLER PROXIMATE 55 BLUE (STAPLE) ×2 IMPLANT
STAPLER VISISTAT 35W (STAPLE) ×2 IMPLANT
SUCTION POOLE TIP (SUCTIONS) ×2 IMPLANT
SUT CHROMIC 3 0 SH 27 (SUTURE) ×4 IMPLANT
SUT ETHILON 2 0 FS 18 (SUTURE) ×2 IMPLANT
SUT PDS AB 1 TP1 96 (SUTURE) ×6 IMPLANT
SUT SILK 2 0 SH CR/8 (SUTURE) ×2 IMPLANT
SUT SILK 2 0 TIES 10X30 (SUTURE) ×2 IMPLANT
SUT SILK 3 0 SH CR/8 (SUTURE) ×2 IMPLANT
SUT SILK 3 0 TIES 10X30 (SUTURE) ×2 IMPLANT
SWAB COLLECTION DEVICE MRSA (MISCELLANEOUS) ×2 IMPLANT
SWAB CULTURE ESWAB REG 1ML (MISCELLANEOUS) ×2 IMPLANT
SYRINGE TOOMEY DISP (SYRINGE) ×2 IMPLANT
TAPE CLOTH SURG 4X10 WHT LF (GAUZE/BANDAGES/DRESSINGS) ×2 IMPLANT
TOWEL OR 17X26 10 PK STRL BLUE (TOWEL DISPOSABLE) ×2 IMPLANT
TRAY FOLEY W/METER SILVER 16FR (SET/KITS/TRAYS/PACK) ×2 IMPLANT
YANKAUER SUCT BULB TIP NO VENT (SUCTIONS) IMPLANT

## 2017-06-29 NOTE — ED Notes (Signed)
Dr Dema Severin with [facility] at bedside.

## 2017-06-29 NOTE — Anesthesia Procedure Notes (Signed)
Arterial Line Insertion Start/End10/15/2018 3:55 PM, 06/29/2017 4:05 PM Performed by: Suella Broad D, anesthesiologist  Patient location: Pre-op. Preanesthetic checklist: patient identified, IV checked, site marked, risks and benefits discussed, surgical consent, monitors and equipment checked, pre-op evaluation, timeout performed and anesthesia consent Lidocaine 1% used for infiltration Left, radial was placed Catheter size: 20 Fr Hand hygiene performed  and maximum sterile barriers used   Attempts: 1 Procedure performed without using ultrasound guided technique. Following insertion, dressing applied. Post procedure assessment: normal and unchanged

## 2017-06-29 NOTE — Op Note (Signed)
06/29/2017  6:43 PM  PATIENT:  Lindsay [name]  66 y.o. female  Patient Care Team: Merrilee Seashore, MD as PCP - General (Internal Medicine) Mcarthur Rossetti, MD as Consulting Physician (Orthopedic Surgery) Ricard Dillon, MD as Consulting Physician (Internal Medicine)  PRE-OPERATIVE DIAGNOSIS:  Perferated Diverticulitis  POST-OPERATIVE DIAGNOSIS:  Perferated Diverticulitis  PROCEDURE:   1. Exploratory laparotomy 2. Sigmoid colectomy (Hartmann's procedure) with end transverse colostomy 3. Left salpingectomy 4. Repair of cystotomy   SURGEON:  Sharon Mt. Dema Severin, MD  ASSISTANT: Lorine Bears, MD for the laparotomy/sigmoid colectomy; Coralie Keens for closure  ANESTHESIA:   general endotracheal  COUNTS:  Sponge, needle and instrument counts were reported correct x2 at the conclusion of the operation.  EBL: 150cc  DRAINS: 19Fr round Blake drain, draining abscess cavity along left sidewall, pelvis and rectal stump  SPECIMEN: 1. Sigmoid colon 2. Left fallopian tube 3. Descending colon  FINDINGS: Frankly necrotic, gangrenous, perforated sigmoid colon with large pericolic abscess containing ~200cc of pus. Abscess cavity had eroded back into the sigmoid colon as there were 2 necrotic regions with frank perforation of the colon into the abscess cavity. Cultures sent. Multiple loops of viable small bowel were attached and freed. The abscess had additionally eroded into the left fallopian tube requiring removal of the left fallopian tube due to non-viabilty. Left ureter was identified prior to any structures being divided and protected. Pelvic abscess identified and drained ~50cc of purulent fluid. The bladder was thin walled, contained underneath a boggy, edematous peritoneum and extended far beyond the usual margins of the bladder on the left and right sides - terminating approximately 3-4cm inferior to the umbilicus. Entire peritoneal surface of bladder inspected and was  preserved free of injury during peritoneal entry as a bladder flap of peritoneum was created extending to the right of the bladder- this was in an area where the peritoneum was thinnest. A 69mm cystotomy was made during instrumentation for placement of a blake drain. This was located on the left superior & lateral portion of the dome. This portion of the bladder and peritoneum appeared virtually indistinguishable due to how thick and edematous the peritoneum was in her inferior abdomen. The cystotomy was then repaired in 2 layers with 3-0 vicryl suture and leak tested - no leak following. A foley catheter was in place.  DISPOSITION: PACU in satisfactory condition  INDICATION: Lindsay [name] is an 66 y.o. female with hx of HTN presented to ED today with 11d hx of LLQ abdominal pain. She thought for the last week and a half she was having bad gas pain as she's also had diarrhea but with it not going away sought further evaluation. +Nausea, no emesis. +Fever/chills. Reports pain is isolated to her left lower quadrant. CT showed a large contained perforation along the sigmoid colon with a large associated abscess and an additional abscess in the pelvis abutting the reflection of peritoneum. I spoke with our interventional radiologist today who is concerned about the location of the perisigmoid abscess - not having a reliable window to drain this. Additionally, it likely contains stool which will be unlikely to be adequately addressed with percutaneous drainage. She has received 4L of crystalloid and IV abx but remains febrile, tachycardic to the 115-125 range with blood pressures in the 80-90s/50s. Given all of these factors, I believe the safest thing for her is to undergo surgery today to further address. I discussed the anatomy, physiology of the GI tract and pathophysiology of diverticulitis with her and her  daughter + son. I discussed the rational for surgery. I discussed the planned procedure including the need  for an ostomy, material risks (including, but not limited to, pain, bleeding, infection, scarring, damage to surrounding structures including viscus and ureter, need for additional procedures, open abdomen, heart attack, stroke, death), benefits and alternatives. Her and her children's questions were answered to their satisfaction. She has elected to proceed with surgery.  DESCRIPTION: The patient was identified in preop holding and taken to the OR where they were placed on the operating room table and SCDs were placed. General endotracheal anesthesia was induced without difficulty. The patient was then prepped and draped in the usual sterile fashion. A surgical timeout was performed indicating the correct patient, procedure, positioning and need for preoperative antibiotics.  A midline incision was made with a scalpel and the subcutaneous tissue divided with electrocautery. The fascia was incised. The peritoneum was identified, lifted and entered using Metzenbaum scissors. The entire peritoneal cavity was edematous throughout and the omentum was adherent to the small bowel it covered in almost a cocoon. The sigmoid colon was identified in the left lower quadrant and gently mobilized. In doing so, the abscess cavity was entered and a large amount of purulent fluid was encountered. Cultures were obtained and sent. The abscess cavity was then evacuated using suction. An abscess in the pelvis was also identified just anterior to the rectum and drained in the same manner; cultures were also sent. The sigmoid colon was mobilized and the left ureter identified in the retroperitoneum and care was taken to ensure it was preserved free of injury. The left fallopian tube was perforated in its mid segment against the sigmoidal abscess and the distal half of it was necrotic and not viable. As such, after again confirmed location of the left ureter was divided using a Ligasure and passed off. The descending colon was  mobilized along the Issaih Kaus line of Toldt up to the splenic flexure. Into the pelvis, proximal rectum was identified and appeared viable beneath this point. There was an abscess anterior but the rectum appeared viable. The proximal rectum/rectosigmoid junction was identified. The peritoneum on each side was incised and the rectosigmoid junction cleared circumferentially. The colon was divided after slow, deliberate closure of the Contour stapler using a green load. The wall was quite thick and edematous which was the justification for a green load.  The descending colon mesentery was foreshortened due to the degree and amount of inflammation from the abscess. The splenic flexure was mobilized entirely with the exception of division of the IMV. Despite this, the colon remained tethered by the mesentery due to the amount and likely chronicity of inflammation in this area. The descending colon would not have adequate reach for a colostomy. Therefore, the next segment that reached the abdominal wall without tension was the transverse colon. The distal transverse colon was therefore selected as the best candidate for a colostomy. The intervening descending colon was therefore resected and the mesentery ligated with a Ligasure. This was additionally passed off as specimen. Hemostasis was then achieved with additional figure of eight 2-0 Vicryl suture.  The abdomen was then irrigated with copious amounts of warm saline until the effluent ran clear. The colostomy site was prepared in the left mid/upper abdomen. The fascia was grasped with Kocher clamps and traction applied. The skin was incised and a nickel sized wheel of skin and subcutaneous tissue removed. The rectus fascia was identified and incised in a cruciate manner. The rectus muscle  then spread bluntly and the posterior sheath incised in a cruciate manner, taking care to protect underlying intra-peritoneal structures and viscus. The stoma site accommodated 2  fingers. The colostomy was passed through the stoma site and reached the skin without tension. The colostomy was inspected to ensure no twisting of the colon nor mesentery.  Attention was turned to placement of the 19Fr Blake drain. During instrumentation for drain placement, a 61mm cystotomy was noted in the left superior dome. This was then repaired with 3-0 Vicryl suture in two layers. A leak test was performed with 400cc saline and the repair was intact without any detectable leak. The drain was therefore placed further laterally and superiorly - draining both abscess cavities and the rectal stump. The fascia was then closed with running #1 looped PDS. The skin was left open. The colostomy was isolated and the staple line excised. The stoma was partially Brooked and secured with a combination of 3-0 chromic and 3-0 Vicryl suture. The stoma was pink and patent at conclusion. A stoma appliance was placed. Moist kerlex then placed in the wound bed and the midline wound covered with 4x4s and tape.  The patient was extubated and transported to PACU in satisfactory condition.  Sharon Mt. Dema Severin, M.D. [facility]   Note: This dictation was prepared with Dragon/digital dictation along with [employer]. Any transcriptional errors that result from this process are unintentional.

## 2017-06-29 NOTE — H&P (Signed)
Reason for consult: Diverticulitis  HPI: Lindsay [name] is an 66 y.o. female with hx of HTN presented to ED today with 11d hx of LLQ abdominal pain. She thought for the last week and a half she was having bad gas pain as she's also had diarrhea but with it not going away sought further evaluation. +Nausea, no emesis. +Fever/chills. Reports pain is isolated to her left lower quadrant.  Never had this before No prior colonoscopy Social: Denies toacco/EtOH/drugs FHx: Denies hx of GI malignancy  Past Medical History:  Diagnosis Date  . Cellulitis   . Hypertension   . Lower extremity ulceration (HCC)   . Lymphedema   . Non-healing wound of lower extremity     Past Surgical History:  Procedure Laterality Date  . ABDOMINAL HYSTERECTOMY    . APPLICATION OF WOUND VAC Right 08/23/2013   Procedure: APPLICATION OF WOUND VAC;  Surgeon: Christopher Y Blackman, MD;  Location: [facility] OR;  Service: Orthopedics;  Laterality: Right;  . I&D EXTREMITY Right 08/23/2013   Procedure: I&D Right Leg Abscess;  Surgeon: Christopher Y Blackman, MD;  Location: [facility] OR;  Service: Orthopedics;  Laterality: Right;  . I&D EXTREMITY Right 08/27/2013   Procedure: REPEAT IRRIGATION AND DEBRIDEMENT OF RIGHT LEG WOUND;  Surgeon: Christopher Y Blackman, MD;  Location: [facility] OR;  Service: Orthopedics;  Laterality: Right;  . I&D EXTREMITY Right 08/30/2013   Procedure: Repeat IRRIGATION AND DEBRIDEMENT Right leg, skin graft;  Surgeon: Christopher Y Blackman, MD;  Location: [facility] OR;  Service: Orthopedics;  Laterality: Right;    Family History  Problem Relation Age of Onset  . CAD Mother   . Hypertension Mother     Social:  reports that she has never smoked. She has never used smokeless tobacco. She reports that she does not drink alcohol or use drugs.  Allergies:  Allergies  Allergen Reactions  . Ancef [Cefazolin] Rash  . Primaxin [Imipenem] Swelling    Developed lip swelling post administration of Primaxin even though she had  tolerated this in the past.  . Heparin     (HIT antibody positive; SRA negative  . Vancomycin Rash    Medications: I have reviewed the patient's current medications.  Results for orders placed or performed during the hospital encounter of 06/29/17 (from the past 48 hour(s))  Lipase, blood     Status: None   Collection Time: 06/29/17  8:24 AM  Result Value Ref Range   Lipase 22 11 - 51 U/L  Comprehensive metabolic panel     Status: Abnormal   Collection Time: 06/29/17  8:24 AM  Result Value Ref Range   Sodium 139 135 - 145 mmol/L   Potassium 3.4 (L) 3.5 - 5.1 mmol/L   Chloride 99 (L) 101 - 111 mmol/L   CO2 22 22 - 32 mmol/L   Glucose, Bld 130 (H) 65 - 99 mg/dL   BUN 21 (H) 6 - 20 mg/dL   Creatinine, Ser 2.11 (H) 0.44 - 1.00 mg/dL   Calcium 8.2 (L) 8.9 - 10.3 mg/dL   Total Protein 6.6 6.5 - 8.1 g/dL   Albumin 2.3 (L) 3.5 - 5.0 g/dL   AST 72 (H) 15 - 41 U/L   ALT 44 14 - 54 U/L   Alkaline Phosphatase 256 (H) 38 - 126 U/L   Total Bilirubin 1.4 (H) 0.3 - 1.2 mg/dL   GFR calc non Af Amer 24 (L) >60 mL/min   GFR calc Af Amer 27 (L) >60 mL/min    Comment: (NOTE)   The eGFR has been calculated using the CKD EPI equation. This calculation has not been validated in all clinical situations. eGFR's persistently <60 mL/min signify possible Chronic Kidney Disease.    Anion gap 18 (H) 5 - 15  CBC     Status: Abnormal   Collection Time: 06/29/17  8:24 AM  Result Value Ref Range   WBC 33.7 (H) 4.0 - 10.5 K/uL   RBC 4.28 3.87 - 5.11 MIL/uL   Hemoglobin 10.8 (L) 12.0 - 15.0 g/dL   HCT 34.2 (L) 36.0 - 46.0 %   MCV 79.9 78.0 - 100.0 fL   MCH 25.2 (L) 26.0 - 34.0 pg   MCHC 31.6 30.0 - 36.0 g/dL   RDW 16.5 (H) 11.5 - 15.5 %   Platelets 465 (H) 150 - 400 K/uL  I-Stat CG4 Lactic Acid, ED     Status: Abnormal   Collection Time: 06/29/17  8:38 AM  Result Value Ref Range   Lactic Acid, Venous 6.44 (HH) 0.5 - 1.9 mmol/L   Comment NOTIFIED PHYSICIAN     Ct Abdomen Pelvis Wo  Contrast  Result Date: 06/29/2017 CLINICAL DATA:  Lower abdominal pain. EXAM: CT ABDOMEN AND PELVIS WITHOUT CONTRAST TECHNIQUE: Multidetector CT imaging of the abdomen and pelvis was performed following the standard protocol without IV contrast. COMPARISON:  None. FINDINGS: Lower chest: Mild bibasilar subsegmental atelectasis is noted. Hepatobiliary: No focal liver abnormality is seen. No gallstones, gallbladder wall thickening, or biliary dilatation. Pancreas: Unremarkable. No pancreatic ductal dilatation or surrounding inflammatory changes. Spleen: Normal in size without focal abnormality. Adrenals/Urinary Tract: Adrenal glands and kidneys are unremarkable. No hydronephrosis or renal obstruction is noted. No renal or ureteral calculi are noted. Urinary bladder is decompressed. Stomach/Bowel: The stomach appears normal. The appendix is not clearly visualized. There is no evidence bowel obstruction. Sigmoid diverticulitis is noted. Large paradiverticular abscess containing stool is noted which measures 8.9 x 6.3 cm. Vascular/Lymphatic: Aortic atherosclerosis. No enlarged abdominal or pelvic lymph nodes. Reproductive: Status post hysterectomy. No adnexal masses. Other: Probable 3.7 x 3.7 cm fluid collection is noted in the pre rectal space of the pelvis concerning for possible abscess. Musculoskeletal: No acute or significant osseous findings. IMPRESSION: Sigmoid diverticulitis is noted with 8.9 x 6.3 cm paradiverticular abscess which contains stool. 3.7 x 3.7 fluid collection is also noted in the prerectal space of the pelvis concerning for possible abscess. Aortic atherosclerosis. Electronically Signed   By: James  Green Jr, M.D.   On: 06/29/2017 09:52   Dg Chest Port 1 View  Result Date: 06/29/2017 CLINICAL DATA:  Sepsis. EXAM: PORTABLE CHEST 1 VIEW COMPARISON:  Chest x-ray dated Jan 31, 2016. FINDINGS: Stable cardiomegaly. Normal pulmonary vascularity. Atherosclerotic calcification of the aortic arch.  Mild elevation of the right hemidiaphragm with adjacent right basilar atelectasis. No focal consolidation, pleural effusion, or pneumothorax. No acute osseous abnormality. IMPRESSION: Stable cardiomegaly.  No active disease. Electronically Signed   By: William T Derry M.D.   On: 06/29/2017 09:30    ROS - all of the below systems have been reviewed with the patient and positives are indicated with bold text General: chills, fever or night sweats Eyes: blurry vision or double vision ENT: epistaxis or sore throat Allergy/Immunology: itchy/watery eyes or nasal congestion Hematologic/Lymphatic: bleeding problems, blood clots or swollen lymph nodes Endocrine: temperature intolerance or unexpected weight changes Breast: new or changing breast lumps or nipple discharge Resp: cough, shortness of breath, or wheezing CV: chest pain or dyspnea on exertion GI: as per HPI GU: dysuria,   trouble voiding, or hematuria MSK: joint pain or joint stiffness Neuro: TIA or stroke symptoms Derm: pruritus and skin lesion changes Psych: anxiety and depression  PE Blood pressure (!) 92/44, pulse (!) 118, temperature (!) 102.8 F (39.3 C), temperature source Rectal, resp. rate (!) 22, height 5' 3" (1.6 m), weight 89.1 kg (196 lb 6.4 oz), SpO2 96 %. Constitutional: NAD, comfortable Eyes: Moist conjunctiva, anicteric, PERRL Neck: Trachea midline, no thyromegaly CV: RRR Pulm: Normal work of breathing GI: Obese, soft, moderately ttp in LLQ; no tenderness whatsoever in any other quadrant. No rebound/guarding. Msk: No pitting edema; no clubbing/cyanosis, no deformities Psych: Appropriate affect, A&O x3 Lymph: No palpable cervical or groin lymphadenopathy   Results for orders placed or performed during the hospital encounter of 06/29/17 (from the past 48 hour(s))  Lipase, blood     Status: None   Collection Time: 06/29/17  8:24 AM  Result Value Ref Range   Lipase 22 11 - 51 U/L  Comprehensive metabolic panel      Status: Abnormal   Collection Time: 06/29/17  8:24 AM  Result Value Ref Range   Sodium 139 135 - 145 mmol/L   Potassium 3.4 (L) 3.5 - 5.1 mmol/L   Chloride 99 (L) 101 - 111 mmol/L   CO2 22 22 - 32 mmol/L   Glucose, Bld 130 (H) 65 - 99 mg/dL   BUN 21 (H) 6 - 20 mg/dL   Creatinine, Ser 2.11 (H) 0.44 - 1.00 mg/dL   Calcium 8.2 (L) 8.9 - 10.3 mg/dL   Total Protein 6.6 6.5 - 8.1 g/dL   Albumin 2.3 (L) 3.5 - 5.0 g/dL   AST 72 (H) 15 - 41 U/L   ALT 44 14 - 54 U/L   Alkaline Phosphatase 256 (H) 38 - 126 U/L   Total Bilirubin 1.4 (H) 0.3 - 1.2 mg/dL   GFR calc non Af Amer 24 (L) >60 mL/min   GFR calc Af Amer 27 (L) >60 mL/min    Comment: (NOTE) The eGFR has been calculated using the CKD EPI equation. This calculation has not been validated in all clinical situations. eGFR's persistently <60 mL/min signify possible Chronic Kidney Disease.    Anion gap 18 (H) 5 - 15  CBC     Status: Abnormal   Collection Time: 06/29/17  8:24 AM  Result Value Ref Range   WBC 33.7 (H) 4.0 - 10.5 K/uL   RBC 4.28 3.87 - 5.11 MIL/uL   Hemoglobin 10.8 (L) 12.0 - 15.0 g/dL   HCT 34.2 (L) 36.0 - 46.0 %   MCV 79.9 78.0 - 100.0 fL   MCH 25.2 (L) 26.0 - 34.0 pg   MCHC 31.6 30.0 - 36.0 g/dL   RDW 16.5 (H) 11.5 - 15.5 %   Platelets 465 (H) 150 - 400 K/uL  I-Stat CG4 Lactic Acid, ED     Status: Abnormal   Collection Time: 06/29/17  8:38 AM  Result Value Ref Range   Lactic Acid, Venous 6.44 (HH) 0.5 - 1.9 mmol/L   Comment NOTIFIED PHYSICIAN     Ct Abdomen Pelvis Wo Contrast  Result Date: 06/29/2017 CLINICAL DATA:  Lower abdominal pain. EXAM: CT ABDOMEN AND PELVIS WITHOUT CONTRAST TECHNIQUE: Multidetector CT imaging of the abdomen and pelvis was performed following the standard protocol without IV contrast. COMPARISON:  None. FINDINGS: Lower chest: Mild bibasilar subsegmental atelectasis is noted. Hepatobiliary: No focal liver abnormality is seen. No gallstones, gallbladder wall thickening, or biliary  dilatation. Pancreas: Unremarkable. No pancreatic ductal   dilatation or surrounding inflammatory changes. Spleen: Normal in size without focal abnormality. Adrenals/Urinary Tract: Adrenal glands and kidneys are unremarkable. No hydronephrosis or renal obstruction is noted. No renal or ureteral calculi are noted. Urinary bladder is decompressed. Stomach/Bowel: The stomach appears normal. The appendix is not clearly visualized. There is no evidence bowel obstruction. Sigmoid diverticulitis is noted. Large paradiverticular abscess containing stool is noted which measures 8.9 x 6.3 cm. Vascular/Lymphatic: Aortic atherosclerosis. No enlarged abdominal or pelvic lymph nodes. Reproductive: Status post hysterectomy. No adnexal masses. Other: Probable 3.7 x 3.7 cm fluid collection is noted in the pre rectal space of the pelvis concerning for possible abscess. Musculoskeletal: No acute or significant osseous findings. IMPRESSION: Sigmoid diverticulitis is noted with 8.9 x 6.3 cm paradiverticular abscess which contains stool. 3.7 x 3.7 fluid collection is also noted in the prerectal space of the pelvis concerning for possible abscess. Aortic atherosclerosis. Electronically Signed   By: James  Green Jr, M.D.   On: 06/29/2017 09:52   Dg Chest Port 1 View  Result Date: 06/29/2017 CLINICAL DATA:  Sepsis. EXAM: PORTABLE CHEST 1 VIEW COMPARISON:  Chest x-ray dated Jan 31, 2016. FINDINGS: Stable cardiomegaly. Normal pulmonary vascularity. Atherosclerotic calcification of the aortic arch. Mild elevation of the right hemidiaphragm with adjacent right basilar atelectasis. No focal consolidation, pleural effusion, or pneumothorax. No acute osseous abnormality. IMPRESSION: Stable cardiomegaly.  No active disease. Electronically Signed   By: William T Derry M.D.   On: 06/29/2017 09:30    A/P: 66F with acute sigmoid diverticulitis, associated pericolonic abscess + pelvic abscess -She has no signs on exam of peritonitis, she is  mentating normally and is normotensive - currently 1L into her resuscitation. Her HR is in the 110-115 range. She has been taking little if anything by mouth the last week which can certainly explain her hypovolemic state and given the lack of free perforation and peritonitis, it seems reasonable to admit her to stepdown for resuscitation with plans for urgent percutaneous drainage. Should any of this fail, we will plan surgery which will almost certainly entail a stoma. I discussed all of this with the patient and her daughter whom are on board with the plan -Admit to stepdown -NPO, IVF resuscitation, foley -IR for percutaneous drainage of the 2 abscesses -IV cipro/flagyl given AKI -Will continue monitor closely  Christopher M. White, M.D. General and Colorectal Surgery [facility] 

## 2017-06-29 NOTE — Progress Notes (Signed)
I went back by to see Ms. Lutter - to re-evaluate her and update her on the plan. I spoke with our interventional radiologist today who is concerned about the location of the perisigmoid abscess - not having a reliable window to drain this. Additionally, it likely contains stool which will be unlikely to be adequately addressed with percutaneous drainage. She has received 4L of crystalloid and IV abx but remains febrile, tachycardic to the 115-125 range with blood pressures in the 80-90s/50s. Given all of these factors, I believe the safest thing for her is to undergo surgery today to further address.  I discussed the anatomy, physiology of the GI tract and pathophysiology of diverticulitis with her and her daughter + son. I discussed the rational for surgery. I discussed the planned procedure including the need for an ostomy, material risks (including, but not limited to, pain, bleeding, infection, scarring, damage to surrounding structures including viscus and ureter, need for additional procedures, open abdomen, heart attack, stroke, death), benefits and alternatives. Her and her children's questions were answered to their satisfaction. She has elected to proceed with surgery.  Sharon Mt. Dema Severin, M.D. General and Colorectal Surgery [facility]

## 2017-06-29 NOTE — Anesthesia Preprocedure Evaluation (Addendum)
Anesthesia Evaluation  Patient identified by MRN, date of birth, ID band Patient awake    Reviewed: Allergy & Precautions, NPO status , Patient's Chart, lab work & pertinent test results  Airway Mallampati: II  TM Distance: >3 FB Neck ROM: Full    Dental  (+) Partial Lower, Dental Advisory Given   Pulmonary sleep apnea ,    breath sounds clear to auscultation       Cardiovascular hypertension, Pt. on medications  Rhythm:Regular Rate:Normal     Neuro/Psych negative neurological ROS     GI/Hepatic Neg liver ROS, GERD  Medicated,  Endo/Other  Hypothyroidism   Renal/GU negative Renal ROS     Musculoskeletal negative musculoskeletal ROS (+)   Abdominal   Peds  Hematology negative hematology ROS (+)   Anesthesia Other Findings Day of surgery medications reviewed with the patient.  Reproductive/Obstetrics                            Lab Results  Component Value Date   WBC 33.7 (H) 06/29/2017   HGB 10.8 (L) 06/29/2017   HCT 34.2 (L) 06/29/2017   MCV 79.9 06/29/2017   PLT 465 (H) 06/29/2017   Lab Results  Component Value Date   CREATININE 2.11 (H) 06/29/2017   BUN 21 (H) 06/29/2017   NA 139 06/29/2017   K 3.4 (L) 06/29/2017   CL 99 (L) 06/29/2017   CO2 22 06/29/2017   Lab Results  Component Value Date   INR 1.07 01/30/2016   Lactate: 4.7  Anesthesia Physical Anesthesia Plan  ASA: III and emergent  Anesthesia Plan: General   Post-op Pain Management:    Induction: Intravenous, Rapid sequence and Cricoid pressure planned  PONV Risk Score and Plan: 4 or greater and Ondansetron, Dexamethasone, Midazolam, Scopolamine patch - Pre-op and Treatment may vary due to age or medical condition  Airway Management Planned: Oral ETT  Additional Equipment: Arterial line, CVP and Ultrasound Guidance Line Placement  Intra-op Plan:   Post-operative Plan: Possible Post-op  intubation/ventilation  Informed Consent: I have reviewed the patients History and Physical, chart, labs and discussed the procedure including the risks, benefits and alternatives for the proposed anesthesia with the patient or authorized representative who has indicated his/her understanding and acceptance.   Dental advisory given  Plan Discussed with: CRNA  Anesthesia Plan Comments:        Anesthesia Quick Evaluation

## 2017-06-29 NOTE — Consult Note (Signed)
Chief Complaint: diverticulitis with 2 intra-abdominal abscesses  Referring Physician:Dr. Nadeen Landau  Supervising Physician: Marybelle Killings  Patient Status: [facility] - In-[facility]  HPI: Lindsay [name] is a 65 y.o. female who began having acute LLQ abdominal pain last Thursday.  She states this was acute in onset and has eased off a little bit.  She denies any emesis, but admits to some diarrhea with some trace blood.  She is unsure if she had any fevers at home or not.  She has not been eating and drinking much.  She has a PCP but did not see anyone over the last week.  She finally came to the ED today as she was persistently ill.  Her lactic acid was 6 upon arrival, WBC of 33.7, LFT elevation, Cr of 2.11, tachy in the 110s, respirations in 30-40s, but normotensive.  She had a CT scan that revealed diverticulitis with 2 intra-abdominal abscess, but no frank free air/perforation.  IR has been asked to evaluate the patient for percutaneous drainage.  Past Medical History:  Past Medical History:  Diagnosis Date  . Cellulitis   . Hypertension   . Lower extremity ulceration ([place])   . Lymphedema   . Non-healing wound of lower extremity     Past Surgical History:  Past Surgical History:  Procedure Laterality Date  . ABDOMINAL HYSTERECTOMY    . APPLICATION OF WOUND VAC Right 08/23/2013   Procedure: APPLICATION OF WOUND VAC;  Surgeon: Mcarthur Rossetti, MD;  Location: [place];  Service: Orthopedics;  Laterality: Right;  . I&D EXTREMITY Right 08/23/2013   Procedure: I&D Right Leg Abscess;  Surgeon: Mcarthur Rossetti, MD;  Location: [place];  Service: Orthopedics;  Laterality: Right;  . I&D EXTREMITY Right 08/27/2013   Procedure: REPEAT IRRIGATION AND DEBRIDEMENT OF RIGHT LEG WOUND;  Surgeon: Mcarthur Rossetti, MD;  Location: [place];  Service: Orthopedics;  Laterality: Right;  . I&D EXTREMITY Right 08/30/2013   Procedure: Repeat IRRIGATION AND DEBRIDEMENT Right leg, skin graft;  Surgeon:  Mcarthur Rossetti, MD;  Location: [place];  Service: Orthopedics;  Laterality: Right;    Family History:  Family History  Problem Relation Age of Onset  . CAD Mother   . Hypertension Mother     Social History:  reports that she has never smoked. She has never used smokeless tobacco. She reports that she does not drink alcohol or use drugs.  Allergies:  Allergies  Allergen Reactions  . Ancef [Cefazolin] Rash  . Primaxin [Imipenem] Swelling    Developed lip swelling post administration of Primaxin even though she had tolerated this in the past.  . Heparin     (HIT antibody positive; SRA negative  . Vancomycin Rash    Medications: Medications reviewed in epic  Please HPI for pertinent positives, otherwise complete 10 system ROS negative.  Mallampati Score: MD Evaluation Airway: WNL Heart: Other (comments) Heart  comments: tachy Abdomen: Other (comments) Abdomen comments: very tender in LLQ Chest/ Lungs: Other (comments) Chest/ lungs comments: tachypnic  ASA  Classification: 3 Mallampati/Airway Score: Three  Physical Exam: BP (!) 74/57   Pulse (!) 115   Temp (!) 102.8 F (39.3 C) (Rectal)   Resp (!) 39   Ht _0  (1.6 m)   Wt 196 lb 6.4 oz (89.1 kg)   SpO2 95%   BMI 34.79 kg/m  Body mass index is 34.79 kg/m. General: ill-appearing, obese, black female who is laying in bed in NAD, but somewhat sleepy as well  (  likely from meds) HEENT: head is normocephalic, atraumatic.  Sclera are noninjected.  PERRL.  Ears and nose without any masses or lesions.  Mouth is pink and dry Heart: tachycardic.  Normal s1,s2. No obvious murmurs, gallops, or rubs noted.  Palpable radial pulses bilaterally Lungs: CTAB, no wheezes, rhonchi, or rales noted.  Tachypnea present Abd: soft, very tender with voluntary guarding in the LLQ and suprapubic region, but nontender elsewhere, ND/obese, hypoactive BS, no masses, hernias, or organomegaly Psych: A&Ox3 but somewhat lethargic, likely  secondary to illness and medications   Labs: Results for orders placed or performed during the hospital encounter of 06/29/17 (from the past 48 hour(s))  Lipase, blood     Status: None   Collection Time: 06/29/17  8:24 AM  Result Value Ref Range   Lipase 22 11 - 51 U/L  Comprehensive metabolic panel     Status: Abnormal   Collection Time: 06/29/17  8:24 AM  Result Value Ref Range   Sodium 139 135 - 145 mmol/L   Potassium 3.4 (L) 3.5 - 5.1 mmol/L   Chloride 99 (L) 101 - 111 mmol/L   CO2 22 22 - 32 mmol/L   Glucose, Bld 130 (H) 65 - 99 mg/dL   BUN 21 (H) 6 - 20 mg/dL   Creatinine, Ser 2.11 (H) 0.44 - 1.00 mg/dL   Calcium 8.2 (L) 8.9 - 10.3 mg/dL   Total Protein 6.6 6.5 - 8.1 g/dL   Albumin 2.3 (L) 3.5 - 5.0 g/dL   AST 72 (H) 15 - 41 U/L   ALT 44 14 - 54 U/L   Alkaline Phosphatase 256 (H) 38 - 126 U/L   Total Bilirubin 1.4 (H) 0.3 - 1.2 mg/dL   GFR calc non Af Amer 24 (L) >60 mL/min   GFR calc Af Amer 27 (L) >60 mL/min    Comment: (NOTE) The eGFR has been calculated using the CKD EPI equation. This calculation has not been validated in all clinical situations. eGFR's persistently <60 mL/min signify possible Chronic Kidney Disease.    Anion gap 18 (H) 5 - 15  CBC     Status: Abnormal   Collection Time: 06/29/17  8:24 AM  Result Value Ref Range   WBC 33.7 (H) 4.0 - 10.5 K/uL   RBC 4.28 3.87 - 5.11 MIL/uL   Hemoglobin 10.8 (L) 12.0 - 15.0 g/dL   HCT 34.2 (L) 36.0 - 46.0 %   MCV 79.9 78.0 - 100.0 fL   MCH 25.2 (L) 26.0 - 34.0 pg   MCHC 31.6 30.0 - 36.0 g/dL   RDW 16.5 (H) 11.5 - 15.5 %   Platelets 465 (H) 150 - 400 K/uL  I-Stat CG4 Lactic Acid, ED     Status: Abnormal   Collection Time: 06/29/17  8:38 AM  Result Value Ref Range   Lactic Acid, Venous 6.44 (HH) 0.5 - 1.9 mmol/L   Comment NOTIFIED PHYSICIAN   I-Stat CG4 Lactic Acid, ED  (not at  [facility])     Status: Abnormal   Collection Time: 06/29/17 11:56 AM  Result Value Ref Range   Lactic Acid, Venous 4.07 (HH) 0.5 -  1.9 mmol/L    Imaging: Ct Abdomen Pelvis Wo Contrast  Result Date: 06/29/2017 CLINICAL DATA:  Lower abdominal pain. EXAM: CT ABDOMEN AND PELVIS WITHOUT CONTRAST TECHNIQUE: Multidetector CT imaging of the abdomen and pelvis was performed following the standard protocol without IV contrast. COMPARISON:  None. FINDINGS: Lower chest: Mild bibasilar subsegmental atelectasis is noted. Hepatobiliary: No focal liver  abnormality is seen. No gallstones, gallbladder wall thickening, or biliary dilatation. Pancreas: Unremarkable. No pancreatic ductal dilatation or surrounding inflammatory changes. Spleen: Normal in size without focal abnormality. Adrenals/Urinary Tract: Adrenal glands and kidneys are unremarkable. No hydronephrosis or renal obstruction is noted. No renal or ureteral calculi are noted. Urinary bladder is decompressed. Stomach/Bowel: The stomach appears normal. The appendix is not clearly visualized. There is no evidence bowel obstruction. Sigmoid diverticulitis is noted. Large paradiverticular abscess containing stool is noted which measures 8.9 x 6.3 cm. Vascular/Lymphatic: Aortic atherosclerosis. No enlarged abdominal or pelvic lymph nodes. Reproductive: Status post hysterectomy. No adnexal masses. Other: Probable 3.7 x 3.7 cm fluid collection is noted in the pre rectal space of the pelvis concerning for possible abscess. Musculoskeletal: No acute or significant osseous findings. IMPRESSION: Sigmoid diverticulitis is noted with 8.9 x 6.3 cm paradiverticular abscess which contains stool. 3.7 x 3.7 fluid collection is also noted in the prerectal space of the pelvis concerning for possible abscess. Aortic atherosclerosis. Electronically Signed   By: Marijo Conception, M.D.   On: 06/29/2017 09:52   Dg Chest Port 1 View  Result Date: 06/29/2017 CLINICAL DATA:  Sepsis. EXAM: PORTABLE CHEST 1 VIEW COMPARISON:  Chest x-ray dated Jan 31, 2016. FINDINGS: Stable cardiomegaly. Normal pulmonary vascularity.  Atherosclerotic calcification of the aortic arch. Mild elevation of the right hemidiaphragm with adjacent right basilar atelectasis. No focal consolidation, pleural effusion, or pneumothorax. No acute osseous abnormality. IMPRESSION: Stable cardiomegaly.  No active disease. Electronically Signed   By: Titus Dubin M.D.   On: 06/29/2017 09:30    Assessment/Plan 1. Diverticulitis with multiple intra-abdominal abscesses  Dr. Barbie Banner has reviewed this case as well as her imaging.  He has also discussed with Dr. Dema Severin regarding his recommendations.  The patient's creatinine is 2.11 and unable to get IV contrast.  She was also not given oral contrast for her first CT scan.  The anterior fluid collection is difficult to differentiate from bowel secondary to no contrast.  Dr. Barbie Banner has requested a repeat CT scan today with oral contrast to hopefully opacify the colon to better delineate this area as right now there is no definite window for the anterior collection.  The prerectal collection is amendable to drainage.  Await CT scan as long as patient remains stable and hopefully plan for drain placement x2 tomorrow if able.    Thank you for this interesting consult.  I greatly enjoyed meeting [facility] and look forward to participating in their care.  A copy of this report was sent to the requesting provider on this date.  Electronically Signed: Henreitta Cea 06/29/2017, 1:13 PM   I spent a total of 40 Minutes    in face to face in clinical consultation, greater than 50% of which was counseling/coordinating care for diverticular abscesses x2

## 2017-06-29 NOTE — Anesthesia Procedure Notes (Signed)
Procedure Name: Intubation Date/Time: 06/29/2017 4:43 PM Performed by: Everlean Cherry A Pre-anesthesia Checklist: Patient identified, Emergency Drugs available, Suction available and Patient being monitored Patient Re-evaluated:Patient Re-evaluated prior to induction Oxygen Delivery Method: Circle system utilized Preoxygenation: Pre-oxygenation with 100% oxygen Induction Type: IV induction and Cricoid Pressure applied Laryngoscope Size: Miller and 2 Grade View: Grade II Tube type: Oral Tube size: 7.0 mm Number of attempts: 1 Airway Equipment and Method: Stylet Placement Confirmation: ETT inserted through vocal cords under direct vision,  positive ETCO2 and breath sounds checked- equal and bilateral Secured at: 22 cm Tube secured with: Tape Dental Injury: Teeth and Oropharynx as per pre-operative assessment

## 2017-06-29 NOTE — ED Notes (Signed)
Patient transported to CT 

## 2017-06-29 NOTE — Consult Note (Addendum)
Reason for consult: Diverticulitis  HPI: Lindsay [name] is an 66 y.o. female with hx of HTN presented to ED today with 11d hx of LLQ abdominal pain. She thought for the last week and a half she was having bad gas pain as she's also had diarrhea but with it not going away sought further evaluation. +Nausea, no emesis. +Fever/chills. Reports pain is isolated to her left lower quadrant.  Never had this before No prior colonoscopy Social: Denies toacco/EtOH/drugs FHx: Denies hx of GI malignancy  Past Medical History:  Diagnosis Date  . Cellulitis   . Hypertension   . Lower extremity ulceration ([facility])   . Lymphedema   . Non-healing wound of lower extremity     Past Surgical History:  Procedure Laterality Date  . ABDOMINAL HYSTERECTOMY    . APPLICATION OF WOUND VAC Right 08/23/2013   Procedure: APPLICATION OF WOUND VAC;  Surgeon: Perpetua Elling Y Blackman, MD;  Location[facility];  Service: Orthopedics;  Laterality: Right;  . I&D EXTREMITY Right 08/23/2013   Procedure: I&D Right Leg Abscess;  Surgeon: Milania Haubner Y Blackman, MD;  Location: [facility];  Service: Orthopedics;  Laterality: Right;  . I&D EXTREMITY Right 08/27/2013   Procedure: REPEAT IRRIGATION AND DEBRIDEMENT OF RIGHT LEG WOUND;  Surgeon: Aamani Moose Y Blackman, MD;  Location: [facility];  Service: Orthopedics;  Laterality: Right;  . I&D EXTREMITY Right 08/30/2013   Procedure: Repeat IRRIGATION AND DEBRIDEMENT Right leg, skin graft;  Surgeon: Prestina Raigoza Y Blackman, MD;  Location: [facility];  Service: Orthopedics;  Laterality: Right;    Family History  Problem Relation Age of Onset  . CAD Mother   . Hypertension Mother     Social:  reports that she has never smoked. She has never used smokeless tobacco. She reports that she does not drink alcohol or use drugs.  Allergies:  Allergies  Allergen Reactions  . Ancef [Cefazolin] Rash  . Primaxin [Imipenem] Swelling    Developed lip swelling post administration of Primaxin even though she had  tolerated this in the past.  . Heparin     (HIT antibody positive; SRA negative  . Vancomycin Rash    Medications: I have reviewed the patient's current medications.  Results for orders placed or performed during the hospital encounter of 06/29/17 (from the past 48 hour(s))  Lipase, blood     Status: None   Collection Time: 06/29/17  8:24 AM  Result Value Ref Range   Lipase 22 11 - 51 U/L  Comprehensive metabolic panel     Status: Abnormal   Collection Time: 06/29/17  8:24 AM  Result Value Ref Range   Sodium 139 135 - 145 mmol/L   Potassium 3.4 (L) 3.5 - 5.1 mmol/L   Chloride 99 (L) 101 - 111 mmol/L   CO2 22 22 - 32 mmol/L   Glucose, Bld 130 (H) 65 - 99 mg/dL   BUN 21 (H) 6 - 20 mg/dL   Creatinine, Ser 2.11 (H) 0.44 - 1.00 mg/dL   Calcium 8.2 (L) 8.9 - 10.3 mg/dL   Total Protein 6.6 6.5 - 8.1 g/dL   Albumin 2.3 (L) 3.5 - 5.0 g/dL   AST 72 (H) 15 - 41 U/L   ALT 44 14 - 54 U/L   Alkaline Phosphatase 256 (H) 38 - 126 U/L   Total Bilirubin 1.4 (H) 0.3 - 1.2 mg/dL   GFR calc non Af Amer 24 (L) >60 mL/min   GFR calc Af Amer 27 (L) >60 mL/min    Comment: (NOTE)   The eGFR has been calculated using the CKD EPI equation. This calculation has not been validated in all clinical situations. eGFR's persistently <60 mL/min signify possible Chronic Kidney Disease.    Anion gap 18 (H) 5 - 15  CBC     Status: Abnormal   Collection Time: 06/29/17  8:24 AM  Result Value Ref Range   WBC 33.7 (H) 4.0 - 10.5 K/uL   RBC 4.28 3.87 - 5.11 MIL/uL   Hemoglobin 10.8 (L) 12.0 - 15.0 g/dL   HCT 34.2 (L) 36.0 - 46.0 %   MCV 79.9 78.0 - 100.0 fL   MCH 25.2 (L) 26.0 - 34.0 pg   MCHC 31.6 30.0 - 36.0 g/dL   RDW 16.5 (H) 11.5 - 15.5 %   Platelets 465 (H) 150 - 400 K/uL  I-Stat CG4 Lactic Acid, ED     Status: Abnormal   Collection Time: 06/29/17  8:38 AM  Result Value Ref Range   Lactic Acid, Venous 6.44 (HH) 0.5 - 1.9 mmol/L   Comment NOTIFIED PHYSICIAN     Ct Abdomen Pelvis Wo  Contrast  Result Date: 06/29/2017 CLINICAL DATA:  Lower abdominal pain. EXAM: CT ABDOMEN AND PELVIS WITHOUT CONTRAST TECHNIQUE: Multidetector CT imaging of the abdomen and pelvis was performed following the standard protocol without IV contrast. COMPARISON:  None. FINDINGS: Lower chest: Mild bibasilar subsegmental atelectasis is noted. Hepatobiliary: No focal liver abnormality is seen. No gallstones, gallbladder wall thickening, or biliary dilatation. Pancreas: Unremarkable. No pancreatic ductal dilatation or surrounding inflammatory changes. Spleen: Normal in size without focal abnormality. Adrenals/Urinary Tract: Adrenal glands and kidneys are unremarkable. No hydronephrosis or renal obstruction is noted. No renal or ureteral calculi are noted. Urinary bladder is decompressed. Stomach/Bowel: The stomach appears normal. The appendix is not clearly visualized. There is no evidence bowel obstruction. Sigmoid diverticulitis is noted. Large paradiverticular abscess containing stool is noted which measures 8.9 x 6.3 cm. Vascular/Lymphatic: Aortic atherosclerosis. No enlarged abdominal or pelvic lymph nodes. Reproductive: Status post hysterectomy. No adnexal masses. Other: Probable 3.7 x 3.7 cm fluid collection is noted in the pre rectal space of the pelvis concerning for possible abscess. Musculoskeletal: No acute or significant osseous findings. IMPRESSION: Sigmoid diverticulitis is noted with 8.9 x 6.3 cm paradiverticular abscess which contains stool. 3.7 x 3.7 fluid collection is also noted in the prerectal space of the pelvis concerning for possible abscess. Aortic atherosclerosis. Electronically Signed   By: James  Green Jr, M.D.   On: 06/29/2017 09:52   Dg Chest Port 1 View  Result Date: 06/29/2017 CLINICAL DATA:  Sepsis. EXAM: PORTABLE CHEST 1 VIEW COMPARISON:  Chest x-ray dated Jan 31, 2016. FINDINGS: Stable cardiomegaly. Normal pulmonary vascularity. Atherosclerotic calcification of the aortic arch.  Mild elevation of the right hemidiaphragm with adjacent right basilar atelectasis. No focal consolidation, pleural effusion, or pneumothorax. No acute osseous abnormality. IMPRESSION: Stable cardiomegaly.  No active disease. Electronically Signed   By: William T Derry M.D.   On: 06/29/2017 09:30    ROS - all of the below systems have been reviewed with the patient and positives are indicated with bold text General: chills, fever or night sweats Eyes: blurry vision or double vision ENT: epistaxis or sore throat Allergy/Immunology: itchy/watery eyes or nasal congestion Hematologic/Lymphatic: bleeding problems, blood clots or swollen lymph nodes Endocrine: temperature intolerance or unexpected weight changes Breast: new or changing breast lumps or nipple discharge Resp: cough, shortness of breath, or wheezing CV: chest pain or dyspnea on exertion GI: as per HPI GU: dysuria,   trouble voiding, or hematuria MSK: joint pain or joint stiffness Neuro: TIA or stroke symptoms Derm: pruritus and skin lesion changes Psych: anxiety and depression  PE Blood pressure (!) 92/44, pulse (!) 118, temperature (!) 102.8 F (39.3 C), temperature source Rectal, resp. rate (!) 22, height 5' 3" (1.6 m), weight 89.1 kg (196 lb 6.4 oz), SpO2 96 %. Constitutional: NAD, comfortable Eyes: Moist conjunctiva, anicteric, PERRL Neck: Trachea midline, no thyromegaly CV: RRR Pulm: Normal work of breathing GI: Obese, soft, moderately ttp in LLQ; no tenderness whatsoever in any other quadrant. No rebound/guarding. Msk: No pitting edema; no clubbing/cyanosis, no deformities Psych: Appropriate affect, A&O x3 Lymph: No palpable cervical or groin lymphadenopathy   Results for orders placed or performed during the hospital encounter of 06/29/17 (from the past 48 hour(s))  Lipase, blood     Status: None   Collection Time: 06/29/17  8:24 AM  Result Value Ref Range   Lipase 22 11 - 51 U/L  Comprehensive metabolic panel      Status: Abnormal   Collection Time: 06/29/17  8:24 AM  Result Value Ref Range   Sodium 139 135 - 145 mmol/L   Potassium 3.4 (L) 3.5 - 5.1 mmol/L   Chloride 99 (L) 101 - 111 mmol/L   CO2 22 22 - 32 mmol/L   Glucose, Bld 130 (H) 65 - 99 mg/dL   BUN 21 (H) 6 - 20 mg/dL   Creatinine, Ser 2.11 (H) 0.44 - 1.00 mg/dL   Calcium 8.2 (L) 8.9 - 10.3 mg/dL   Total Protein 6.6 6.5 - 8.1 g/dL   Albumin 2.3 (L) 3.5 - 5.0 g/dL   AST 72 (H) 15 - 41 U/L   ALT 44 14 - 54 U/L   Alkaline Phosphatase 256 (H) 38 - 126 U/L   Total Bilirubin 1.4 (H) 0.3 - 1.2 mg/dL   GFR calc non Af Amer 24 (L) >60 mL/min   GFR calc Af Amer 27 (L) >60 mL/min    Comment: (NOTE) The eGFR has been calculated using the CKD EPI equation. This calculation has not been validated in all clinical situations. eGFR's persistently <60 mL/min signify possible Chronic Kidney Disease.    Anion gap 18 (H) 5 - 15  CBC     Status: Abnormal   Collection Time: 06/29/17  8:24 AM  Result Value Ref Range   WBC 33.7 (H) 4.0 - 10.5 K/uL   RBC 4.28 3.87 - 5.11 MIL/uL   Hemoglobin 10.8 (L) 12.0 - 15.0 g/dL   HCT 34.2 (L) 36.0 - 46.0 %   MCV 79.9 78.0 - 100.0 fL   MCH 25.2 (L) 26.0 - 34.0 pg   MCHC 31.6 30.0 - 36.0 g/dL   RDW 16.5 (H) 11.5 - 15.5 %   Platelets 465 (H) 150 - 400 K/uL  I-Stat CG4 Lactic Acid, ED     Status: Abnormal   Collection Time: 06/29/17  8:38 AM  Result Value Ref Range   Lactic Acid, Venous 6.44 (HH) 0.5 - 1.9 mmol/L   Comment NOTIFIED PHYSICIAN     Ct Abdomen Pelvis Wo Contrast  Result Date: 06/29/2017 CLINICAL DATA:  Lower abdominal pain. EXAM: CT ABDOMEN AND PELVIS WITHOUT CONTRAST TECHNIQUE: Multidetector CT imaging of the abdomen and pelvis was performed following the standard protocol without IV contrast. COMPARISON:  None. FINDINGS: Lower chest: Mild bibasilar subsegmental atelectasis is noted. Hepatobiliary: No focal liver abnormality is seen. No gallstones, gallbladder wall thickening, or biliary  dilatation. Pancreas: Unremarkable. No pancreatic ductal   dilatation or surrounding inflammatory changes. Spleen: Normal in size without focal abnormality. Adrenals/Urinary Tract: Adrenal glands and kidneys are unremarkable. No hydronephrosis or renal obstruction is noted. No renal or ureteral calculi are noted. Urinary bladder is decompressed. Stomach/Bowel: The stomach appears normal. The appendix is not clearly visualized. There is no evidence bowel obstruction. Sigmoid diverticulitis is noted. Large paradiverticular abscess containing stool is noted which measures 8.9 x 6.3 cm. Vascular/Lymphatic: Aortic atherosclerosis. No enlarged abdominal or pelvic lymph nodes. Reproductive: Status post hysterectomy. No adnexal masses. Other: Probable 3.7 x 3.7 cm fluid collection is noted in the pre rectal space of the pelvis concerning for possible abscess. Musculoskeletal: No acute or significant osseous findings. IMPRESSION: Sigmoid diverticulitis is noted with 8.9 x 6.3 cm paradiverticular abscess which contains stool. 3.7 x 3.7 fluid collection is also noted in the prerectal space of the pelvis concerning for possible abscess. Aortic atherosclerosis. Electronically Signed   By: James  Green Jr, M.D.   On: 06/29/2017 09:52   Dg Chest Port 1 View  Result Date: 06/29/2017 CLINICAL DATA:  Sepsis. EXAM: PORTABLE CHEST 1 VIEW COMPARISON:  Chest x-ray dated Jan 31, 2016. FINDINGS: Stable cardiomegaly. Normal pulmonary vascularity. Atherosclerotic calcification of the aortic arch. Mild elevation of the right hemidiaphragm with adjacent right basilar atelectasis. No focal consolidation, pleural effusion, or pneumothorax. No acute osseous abnormality. IMPRESSION: Stable cardiomegaly.  No active disease. Electronically Signed   By: William T Derry M.D.   On: 06/29/2017 09:30    A/P: 66F with acute sigmoid diverticulitis, associated pericolonic abscess + pelvic abscess -She has no signs on exam of peritonitis, she is  mentating normally and is normotensive - currently 1L into her resuscitation. Her HR is in the 110-115 range. She has been taking little if anything by mouth the last week which can certainly explain her hypovolemic state and given the lack of free perforation and peritonitis, it seems reasonable to admit her to stepdown for resuscitation with plans for urgent percutaneous drainage. Should any of this fail, we will plan surgery which will almost certainly entail a stoma. I discussed all of this with the patient and her daughter whom are on board with the plan -Admit to stepdown -NPO, IVF resuscitation, foley -IR for percutaneous drainage of the 2 abscesses -IV cipro/flagyl given AKI -Will continue monitor closely  Anatole Apollo M. Quiera Diffee, M.D. General and Colorectal Surgery [facility] 

## 2017-06-29 NOTE — ED Notes (Signed)
ED Provider at bedside discussing surgical options.

## 2017-06-29 NOTE — ED Triage Notes (Signed)
Pt reports lower abd pain and pressure when urinating since yesterday, denies n/v/d.

## 2017-06-29 NOTE — Transfer of Care (Signed)
Immediate Anesthesia Transfer of Care Note  Patient: Lindsay [name]  Procedure(s) Performed: EXPLORATORY LAPAROTOMY (N/A Abdomen) COLOSTOMY (N/A Abdomen)  Patient Location: PACU  Anesthesia Type:General  Level of Consciousness: sedated, drowsy, patient cooperative and responds to stimulation  Airway & Oxygen Therapy: Patient Spontanous Breathing and Patient connected to nasal cannula oxygen  Post-op Assessment: Report given to RN and Post -op Vital signs reviewed and stable  Post vital signs: Reviewed and stable  Last Vitals:  Vitals:   06/29/17 1330 06/29/17 1345  BP: (!) 95/57 (!) 98/55  Pulse: (!) 119 (!) 117  Resp: (!) 34 (!) 28  Temp:    SpO2: 97% 98%    Last Pain:  Vitals:   06/29/17 1324  TempSrc: Rectal  PainSc:          Complications: No apparent anesthesia complications

## 2017-06-29 NOTE — Anesthesia Procedure Notes (Signed)
Central Venous Catheter Insertion Performed by: Effie Berkshire, anesthesiologist Start/End10/15/2018 3:05 PM, 06/29/2017 3:15 PM Patient location: Pre-op. Preanesthetic checklist: patient identified, IV checked, site marked, risks and benefits discussed, surgical consent, monitors and equipment checked, pre-op evaluation, timeout performed and anesthesia consent Position: Trendelenburg Lidocaine 1% used for infiltration and patient sedated Hand hygiene performed , maximum sterile barriers used  and Seldinger technique used Catheter size: 8 Fr Total catheter length 16. Central line was placed.Double lumen Procedure performed using ultrasound guided technique. Ultrasound Notes:anatomy identified, needle tip was noted to be adjacent to the nerve/plexus identified, no ultrasound evidence of intravascular and/or intraneural injection and image(s) printed for medical record Attempts: 1 Following insertion, dressing applied, line sutured and Biopatch. Post procedure assessment: blood return through all ports  Patient tolerated the procedure well with no immediate complications.

## 2017-06-29 NOTE — ED Notes (Signed)
Surgical consult in room at this time.

## 2017-06-29 NOTE — ED Provider Notes (Signed)
[facility] [facility] DEPT Provider Note   CSN: [id number] Arrival date & time: 06/29/17  1914     History   Chief Complaint Chief Complaint  Patient presents with  . Abdominal Pain    HPI Lindsay [name] is a 66 y.o. female.  Patient is a 66 year old female with a history of hypertension who presents with lower abdominal pain and diarrhea. She states her symptoms started about a week ago with some watery diarrhea. She also feels bloated in her abdomen has some intermittent crampy pain across the lower abdomen and her lower back. She denies any fevers. She's had some nausea but no vomiting. She denies any urinary symptoms although she feels like she's had decreased urination. No history of prior bowel surgeries. No history of similar symptoms in the past. She's been using some over-the-counter medicines without improvement in symptoms.      Past Medical History:  Diagnosis Date  . Cellulitis   . Hypertension   . Lower extremity ulceration ([place])   . Lymphedema   . Non-healing wound of lower extremity     Patient Active Problem List   Diagnosis Date Noted  . Diverticulitis of colon with perforation 06/29/2017  . Erythema nodosum 01/31/2016  . Hypothyroidism 01/31/2016  . Adenopathy   . Wound infection   . Cellulitis of both lower extremities 01/28/2016  . Lymphedema of both lower extremities 01/28/2016  . Increased anion gap metabolic acidosis 78/29/5621  . Allergic angioedema 06/02/2014  . Ulcer of left lower leg ([place]) 06/01/2014  . History of HIT (heparin-induced thrombocytopenia)  09/24/2013  . Thrombocytopenia, unspecified ([place]) 09/21/2013  . Anemia 09/21/2013  . Drug induced neutropenia(288.03) 09/21/2013  . HTN (hypertension) 08/21/2013  . OSA (obstructive sleep apnea) 12/25/2008    Past Surgical History:  Procedure Laterality Date  . ABDOMINAL HYSTERECTOMY    . APPLICATION OF WOUND VAC Right 08/23/2013   Procedure: APPLICATION OF WOUND VAC;  Surgeon: Mcarthur Rossetti, MD;  Location: [facility];  Service: Orthopedics;  Laterality: Right;  . I&D EXTREMITY Right 08/23/2013   Procedure: I&D Right Leg Abscess;  Surgeon: Mcarthur Rossetti, MD;  Location: [place];  Service: Orthopedics;  Laterality: Right;  . I&D EXTREMITY Right 08/27/2013   Procedure: REPEAT IRRIGATION AND DEBRIDEMENT OF RIGHT LEG WOUND;  Surgeon: Mcarthur Rossetti, MD;  Location: [facility];  Service: Orthopedics;  Laterality: Right;  . I&D EXTREMITY Right 08/30/2013   Procedure: Repeat IRRIGATION AND DEBRIDEMENT Right leg, skin graft;  Surgeon: Mcarthur Rossetti, MD;  Location: [facility];  Service: Orthopedics;  Laterality: Right;    OB History    No data available       Home Medications    Prior to Admission medications   Medication Sig Start Date End Date Taking? Authorizing Provider  acetaminophen (TYLENOL) 325 MG tablet Take 2 tablets (650 mg total) by mouth every 6 (six) hours as needed for mild pain or fever. 06/03/14  Yes Delfina Redwood, MD  amLODipine (NORVASC) 5 MG tablet Take 5 mg by mouth daily.   Yes [provider]  Multiple Vitamins-Minerals (CENTRUM ADULTS PO) Take 1 tablet by mouth daily.   Yes [provider]  clobetasol cream (TEMOVATE) 3.08 % Apply 1 application topically daily. 01/08/16   [provider]  colchicine 0.6 MG tablet Take 1 tablet (0.6 mg total) by mouth 2 (two) times daily. Patient not taking: Reported on 06/29/2017 02/05/16   Rama, Venetia Maxon, MD  feeding supplement, ENSURE ENLIVE, (ENSURE ENLIVE) LIQD Take  237 mLs by mouth 2 (two) times daily between meals. Patient not taking: Reported on 06/29/2017 02/05/16   Rama, Venetia Maxon, MD  oxyCODONE (OXY IR/ROXICODONE) 5 MG immediate release tablet Take 1-2 tablets (5-10 mg total) by mouth every 4 (four) hours as needed for moderate pain. Patient not taking: Reported on 06/29/2017 02/05/16   Rama, Venetia Maxon, MD  pantoprazole (PROTONIX) 40 MG tablet Take 1 tablet (40 mg  total) by mouth daily. Patient not taking: Reported on 06/29/2017 02/05/16   Rama, Venetia Maxon, MD  polyethylene glycol (MIRALAX / GLYCOLAX) packet Take 17 g by mouth daily as needed for mild constipation. Patient not taking: Reported on 06/29/2017 02/05/16   Rama, Venetia Maxon, MD  potassium iodide (SSKI) 1 GM/ML solution Take by mouth 3 (three) times daily. Started at 5 drops, then increase 1 drop daily till patient reaches 15 drops. Pt is up to 9 drops daily as of 01/28/16    [provider]  predniSONE (DELTASONE) 20 MG tablet Take 2 tablets (40 mg total) by mouth daily with breakfast. Patient not taking: Reported on 06/29/2017 02/05/16   Rama, Venetia Maxon, MD  tacrolimus (PROTOPIC) 0.1 % ointment Apply topically 2 (two) times daily. Patient not taking: Reported on 06/29/2017 02/05/16   Rama, Venetia Maxon, MD    Family History Family History  Problem Relation Age of Onset  . CAD Mother   . Hypertension Mother     Social History Social History  Substance Use Topics  . Smoking status: Never Smoker  . Smokeless tobacco: Never Used  . Alcohol use No     Allergies   Ancef [cefazolin]; Primaxin [imipenem]; Heparin; and Vancomycin   Review of Systems Review of Systems  Constitutional: Negative for chills, diaphoresis, fatigue and fever.  HENT: Negative for congestion, rhinorrhea and sneezing.   Eyes: Negative.   Respiratory: Negative for cough, chest tightness and shortness of breath.   Cardiovascular: Negative for chest pain and leg swelling.  Gastrointestinal: Positive for abdominal pain, diarrhea and nausea. Negative for blood in stool and vomiting.  Genitourinary: Negative for difficulty urinating, flank pain, frequency and hematuria.  Musculoskeletal: Positive for back pain. Negative for arthralgias.  Skin: Negative for rash.  Neurological: Negative for dizziness, speech difficulty, weakness, numbness and headaches.     Physical Exam Updated Vital Signs BP 104/63  (BP Location: Right Arm)   Pulse (!) 115   Temp (!) 102.8 F (39.3 C) (Rectal)   Resp (!) 43   Ht 5\' 3"  (1.6 m)   Wt 89.1 kg (196 lb 6.4 oz)   SpO2 95%   BMI 34.79 kg/m   Physical Exam  Constitutional: She is oriented to person, place, and time. She appears well-developed and well-nourished.  HENT:  Head: Normocephalic and atraumatic.  Eyes: Pupils are equal, round, and reactive to light.  Neck: Normal range of motion. Neck supple.  Cardiovascular: Regular rhythm and normal heart sounds.  Tachycardia present.   Pulmonary/Chest: Effort normal and breath sounds normal. No respiratory distress. She has no wheezes. She has no rales. She exhibits no tenderness.  Abdominal: Soft. Bowel sounds are normal. She exhibits distension. There is tenderness (mild generalized tenderness, more across the left lower quadrant). There is no rebound and no guarding.  Musculoskeletal: Normal range of motion. She exhibits no edema.  Lymphadenopathy:    She has no cervical adenopathy.  Neurological: She is alert and oriented to person, place, and time.  Skin: Skin is warm and dry. No rash noted.  Psychiatric: She has a normal mood and affect.     ED Treatments / Results  Labs (all labs ordered are listed, but only abnormal results are displayed) Labs Reviewed  COMPREHENSIVE METABOLIC PANEL - Abnormal; Notable for the following:       Result Value   Potassium 3.4 (*)    Chloride 99 (*)    Glucose, Bld 130 (*)    BUN 21 (*)    Creatinine, Ser 2.11 (*)    Calcium 8.2 (*)    Albumin 2.3 (*)    AST 72 (*)    Alkaline Phosphatase 256 (*)    Total Bilirubin 1.4 (*)    GFR calc non Af Amer 24 (*)    GFR calc Af Amer 27 (*)    Anion gap 18 (*)    All other components within normal limits  CBC - Abnormal; Notable for the following:    WBC 33.7 (*)    Hemoglobin 10.8 (*)    HCT 34.2 (*)    MCH 25.2 (*)    RDW 16.5 (*)    Platelets 465 (*)    All other components within normal limits  I-STAT CG4  LACTIC ACID, ED - Abnormal; Notable for the following:    Lactic Acid, Venous 6.44 (*)    All other components within normal limits  CULTURE, BLOOD (ROUTINE X 2)  CULTURE, BLOOD (ROUTINE X 2)  LIPASE, BLOOD  URINALYSIS, ROUTINE W REFLEX MICROSCOPIC  HIV ANTIBODY (ROUTINE TESTING)  LACTIC ACID, PLASMA  LACTIC ACID, PLASMA  I-STAT CG4 LACTIC ACID, ED    EKG  EKG Interpretation None     ED ECG REPORT   Date: 06/29/2017  Rate: 115  Rhythm: sinus tachycardia  QRS Axis: normal  Intervals: normal  ST/T Wave abnormalities: normal  Conduction Disutrbances:none  Narrative Interpretation:   Old EKG Reviewed: none available  I have personally reviewed the EKG tracing and agree with the computerized printout as noted.   Radiology Ct Abdomen Pelvis Wo Contrast  Result Date: 06/29/2017 CLINICAL DATA:  Lower abdominal pain. EXAM: CT ABDOMEN AND PELVIS WITHOUT CONTRAST TECHNIQUE: Multidetector CT imaging of the abdomen and pelvis was performed following the standard protocol without IV contrast. COMPARISON:  None. FINDINGS: Lower chest: Mild bibasilar subsegmental atelectasis is noted. Hepatobiliary: No focal liver abnormality is seen. No gallstones, gallbladder wall thickening, or biliary dilatation. Pancreas: Unremarkable. No pancreatic ductal dilatation or surrounding inflammatory changes. Spleen: Normal in size without focal abnormality. Adrenals/Urinary Tract: Adrenal glands and kidneys are unremarkable. No hydronephrosis or renal obstruction is noted. No renal or ureteral calculi are noted. Urinary bladder is decompressed. Stomach/Bowel: The stomach appears normal. The appendix is not clearly visualized. There is no evidence bowel obstruction. Sigmoid diverticulitis is noted. Large paradiverticular abscess containing stool is noted which measures 8.9 x 6.3 cm. Vascular/Lymphatic: Aortic atherosclerosis. No enlarged abdominal or pelvic lymph nodes. Reproductive: Status post hysterectomy. No  adnexal masses. Other: Probable 3.7 x 3.7 cm fluid collection is noted in the pre rectal space of the pelvis concerning for possible abscess. Musculoskeletal: No acute or significant osseous findings. IMPRESSION: Sigmoid diverticulitis is noted with 8.9 x 6.3 cm paradiverticular abscess which contains stool. 3.7 x 3.7 fluid collection is also noted in the prerectal space of the pelvis concerning for possible abscess. Aortic atherosclerosis. Electronically Signed   By: Marijo Conception, M.D.   On: 06/29/2017 09:52   Dg Chest Port 1 View  Result Date: 06/29/2017 CLINICAL DATA:  Sepsis. EXAM: PORTABLE CHEST  1 VIEW COMPARISON:  Chest x-ray dated Jan 31, 2016. FINDINGS: Stable cardiomegaly. Normal pulmonary vascularity. Atherosclerotic calcification of the aortic arch. Mild elevation of the right hemidiaphragm with adjacent right basilar atelectasis. No focal consolidation, pleural effusion, or pneumothorax. No acute osseous abnormality. IMPRESSION: Stable cardiomegaly.  No active disease. Electronically Signed   By: Titus Dubin M.D.   On: 06/29/2017 09:30    Procedures Procedures (including critical care time)  Medications Ordered in ED Medications  metroNIDAZOLE (FLAGYL) IVPB 500 mg (0 mg Intravenous Stopped 06/29/17 1056)  ciprofloxacin (CIPRO) IVPB 400 mg (0 mg Intravenous Stopped 06/29/17 1057)  sodium chloride 0.9 % bolus 1,000 mL (1,000 mLs Intravenous New Bag/Given 06/29/17 1110)    And  sodium chloride 0.9 % bolus 1,000 mL (1,000 mLs Intravenous New Bag/Given 06/29/17 1002)  iopamidol (ISOVUE-300) 61 % injection (not administered)  0.45 % NaCl with KCl 20 mEq / L infusion (not administered)  ciprofloxacin (CIPRO) IVPB 400 mg (not administered)    And  metroNIDAZOLE (FLAGYL) IVPB 500 mg (not administered)  acetaminophen (TYLENOL) tablet 650 mg (not administered)    Or  acetaminophen (TYLENOL) suppository 650 mg (not administered)  HYDROmorphone (DILAUDID) injection 1 mg (not  administered)  ondansetron (ZOFRAN-ODT) disintegrating tablet 4 mg (not administered)    Or  ondansetron (ZOFRAN) injection 4 mg (not administered)  pantoprazole (PROTONIX) injection 40 mg (not administered)  hydrALAZINE (APRESOLINE) injection 10 mg (not administered)  sodium chloride 0.9 % bolus 1,000 mL (1,000 mLs Intravenous New Bag/Given 06/29/17 0845)  fentaNYL (SUBLIMAZE) injection 50 mcg (50 mcg Intravenous Given 06/29/17 0841)  ondansetron (ZOFRAN) injection 4 mg (4 mg Intravenous Given 06/29/17 0840)  acetaminophen (TYLENOL) suppository 650 mg (650 mg Rectal Given 06/29/17 1002)     Initial Impression / Assessment and Plan / ED Course  I have reviewed the triage vital signs and the nursing notes.  Pertinent labs & imaging results that were available during my care of the patient were reviewed by me and considered in my medical decision making (see chart for details).  Clinical Course as of Jun 30 1115  Mon Jun 29, 2017  0912 PT with markedly elevated lactate, which I suspect is from an intra-abdominal pathology given her symptoms.  She does now have a noted rectal temp of 102, code sepsis intitiated.  Pharmacy consulted regarding abx coverage given her allergies and cipro/flagyl was recommended  [MB]    Clinical Course User Index [MB] Malvin Johns, MD    Patient's a 66 year old female who comes in with abdominal distention and cramping and fever. She did meet criteria for sepsis and was treated with antibiotics and 30 cc/kg fluid bolus. Her blood pressure is soft. CT scan shows evidence of diverticulitis with a large diverticular abscess and pararectal abscess as well.  Surgery has been consulted and will see the pt.  Final Clinical Impressions(s) / ED Diagnoses   Final diagnoses:  Diverticulitis  Intestinal diverticular abscess  Sepsis, due to unspecified organism [facility])    New Prescriptions New Prescriptions   No medications on file     Malvin Johns,  MD 06/29/17 1116

## 2017-06-29 NOTE — ED Notes (Signed)
Notified by lab of lactic acid value. Notified MD

## 2017-06-29 NOTE — ED Notes (Signed)
Patient returned from CT

## 2017-06-30 ENCOUNTER — Encounter (HOSPITAL_COMMUNITY): Payer: Self-pay | Admitting: Surgery

## 2017-06-30 LAB — BLOOD CULTURE ID PANEL (REFLEXED)
Acinetobacter baumannii: NOT DETECTED
Acinetobacter baumannii: NOT DETECTED
Candida albicans: NOT DETECTED
Candida albicans: NOT DETECTED
Candida glabrata: NOT DETECTED
Candida glabrata: NOT DETECTED
Candida krusei: NOT DETECTED
Candida krusei: NOT DETECTED
Candida parapsilosis: NOT DETECTED
Candida parapsilosis: NOT DETECTED
Candida tropicalis: DETECTED — AB
Candida tropicalis: NOT DETECTED
Carbapenem resistance: NOT DETECTED
Enterobacter cloacae complex: NOT DETECTED
Enterobacter cloacae complex: NOT DETECTED
Enterobacteriaceae species: NOT DETECTED
Enterobacteriaceae species: DETECTED — AB
Enterococcus species: NOT DETECTED
Enterococcus species: NOT DETECTED
Escherichia coli: NOT DETECTED
Escherichia coli: DETECTED — AB
Haemophilus influenzae: NOT DETECTED
Haemophilus influenzae: NOT DETECTED
Klebsiella oxytoca: NOT DETECTED
Klebsiella oxytoca: NOT DETECTED
Klebsiella pneumoniae: NOT DETECTED
Klebsiella pneumoniae: NOT DETECTED
Listeria monocytogenes: NOT DETECTED
Listeria monocytogenes: NOT DETECTED
Neisseria meningitidis: NOT DETECTED
Neisseria meningitidis: NOT DETECTED
Proteus species: NOT DETECTED
Proteus species: NOT DETECTED
Pseudomonas aeruginosa: NOT DETECTED
Pseudomonas aeruginosa: NOT DETECTED
Serratia marcescens: NOT DETECTED
Serratia marcescens: NOT DETECTED
Staphylococcus aureus (BCID): NOT DETECTED
Staphylococcus aureus (BCID): NOT DETECTED
Staphylococcus species: NOT DETECTED
Staphylococcus species: NOT DETECTED
Streptococcus agalactiae: NOT DETECTED
Streptococcus agalactiae: NOT DETECTED
Streptococcus pneumoniae: NOT DETECTED
Streptococcus pneumoniae: NOT DETECTED
Streptococcus pyogenes: NOT DETECTED
Streptococcus pyogenes: NOT DETECTED
Streptococcus species: DETECTED — AB
Streptococcus species: NOT DETECTED

## 2017-06-30 LAB — CBC
HCT: 24.4 % — ABNORMAL LOW (ref 36.0–46.0)
Hemoglobin: 7.5 g/dL — ABNORMAL LOW (ref 12.0–15.0)
MCH: 25.2 pg — ABNORMAL LOW (ref 26.0–34.0)
MCHC: 30.7 g/dL (ref 30.0–36.0)
MCV: 81.9 fL (ref 78.0–100.0)
Platelets: 353 10*3/uL (ref 150–400)
RBC: 2.98 MIL/uL — ABNORMAL LOW (ref 3.87–5.11)
RDW: 16.7 % — ABNORMAL HIGH (ref 11.5–15.5)
WBC: 48.4 10*3/uL — ABNORMAL HIGH (ref 4.0–10.5)

## 2017-06-30 LAB — CBC WITH DIFFERENTIAL/PLATELET
Basophils Absolute: 0 10*3/uL (ref 0.0–0.1)
Basophils Relative: 0 %
Eosinophils Absolute: 0 10*3/uL (ref 0.0–0.7)
Eosinophils Relative: 0 %
HCT: 24.3 % — ABNORMAL LOW (ref 36.0–46.0)
Hemoglobin: 7.7 g/dL — ABNORMAL LOW (ref 12.0–15.0)
Lymphocytes Relative: 1 %
Lymphs Abs: 0.5 10*3/uL — ABNORMAL LOW (ref 0.7–4.0)
MCH: 25.8 pg — ABNORMAL LOW (ref 26.0–34.0)
MCHC: 31.7 g/dL (ref 30.0–36.0)
MCV: 81.5 fL (ref 78.0–100.0)
Monocytes Absolute: 1.4 10*3/uL — ABNORMAL HIGH (ref 0.1–1.0)
Monocytes Relative: 3 %
Neutro Abs: 45.7 10*3/uL — ABNORMAL HIGH (ref 1.7–7.7)
Neutrophils Relative %: 96 %
Platelets: 298 10*3/uL (ref 150–400)
RBC: 2.98 MIL/uL — ABNORMAL LOW (ref 3.87–5.11)
RDW: 16.9 % — ABNORMAL HIGH (ref 11.5–15.5)
WBC: 47.6 10*3/uL — ABNORMAL HIGH (ref 4.0–10.5)

## 2017-06-30 LAB — BASIC METABOLIC PANEL
Anion gap: 9 (ref 5–15)
BUN: 21 mg/dL — ABNORMAL HIGH (ref 6–20)
CO2: 26 mmol/L (ref 22–32)
Calcium: 7.6 mg/dL — ABNORMAL LOW (ref 8.9–10.3)
Chloride: 105 mmol/L (ref 101–111)
Creatinine, Ser: 1.77 mg/dL — ABNORMAL HIGH (ref 0.44–1.00)
GFR calc Af Amer: 34 mL/min — ABNORMAL LOW (ref >60)
GFR calc non Af Amer: 29 mL/min — ABNORMAL LOW (ref >60)
Glucose, Bld: 117 mg/dL — ABNORMAL HIGH (ref 65–99)
Potassium: 4.4 mmol/L (ref 3.5–5.1)
Sodium: 140 mmol/L (ref 135–145)

## 2017-06-30 LAB — MRSA PCR SCREENING: MRSA by PCR: NEGATIVE

## 2017-06-30 LAB — PHOSPHORUS: Phosphorus: 4.5 mg/dL (ref 2.5–4.6)

## 2017-06-30 LAB — MAGNESIUM: Magnesium: 1.8 mg/dL (ref 1.7–2.4)

## 2017-06-30 MED ORDER — DIPHENHYDRAMINE HCL 50 MG/ML IJ SOLN
50.0000 mg | Freq: Once | INTRAMUSCULAR | Status: DC | PRN
  Filled 2017-06-30: qty 1

## 2017-06-30 MED ORDER — SODIUM CHLORIDE 0.9% FLUSH: 9.0000 mL | INTRAVENOUS | Status: DC | PRN

## 2017-06-30 MED ORDER — NALOXONE HCL 0.4 MG/ML IJ SOLN: 0.4000 mg | INTRAMUSCULAR | Status: DC | PRN

## 2017-06-30 MED ORDER — PIPERACILLIN-TAZOBACTAM 3.375 G IVPB 30 MIN
3.3750 g | Freq: Once | INTRAVENOUS | Status: AC
  Administered 2017-06-30: 3.375 g via INTRAVENOUS
  Filled 2017-06-30: qty 50

## 2017-06-30 MED ORDER — FLUCONAZOLE IN SODIUM CHLORIDE 400-0.9 MG/200ML-% IV SOLN
400.0000 mg | INTRAVENOUS | Status: DC
  Administered 2017-07-01 – 2017-07-04 (×4): 400 mg via INTRAVENOUS
  Filled 2017-06-30 (×4): qty 200

## 2017-06-30 MED ORDER — DIPHENHYDRAMINE HCL 50 MG/ML IJ SOLN
12.5000 mg | Freq: Four times a day (QID) | INTRAMUSCULAR | Status: DC | PRN
  Administered 2017-07-10 – 2017-07-13 (×2): 12.5 mg via INTRAVENOUS
  Filled 2017-06-30 (×2): qty 1

## 2017-06-30 MED ORDER — HYDROMORPHONE 1 MG/ML IV SOLN
INTRAVENOUS | Status: DC
  Administered 2017-06-30: 0.999 mg via INTRAVENOUS
  Administered 2017-06-30 (×2): 0.2 mg via INTRAVENOUS
  Administered 2017-07-01: 0.4 mg via INTRAVENOUS
  Administered 2017-07-01 – 2017-07-02 (×4): 0.2 mg via INTRAVENOUS
  Administered 2017-07-02: 0.6 mg via INTRAVENOUS

## 2017-06-30 MED ORDER — FLUCONAZOLE IN SODIUM CHLORIDE 400-0.9 MG/200ML-% IV SOLN
800.0000 mg | Freq: Once | INTRAVENOUS | Status: AC
  Administered 2017-06-30: 800 mg via INTRAVENOUS
  Filled 2017-06-30: qty 400

## 2017-06-30 MED ORDER — PIPERACILLIN-TAZOBACTAM 3.375 G IVPB
3.3750 g | Freq: Three times a day (TID) | INTRAVENOUS | Status: AC
  Administered 2017-06-30 – 2017-07-15 (×45): 3.375 g via INTRAVENOUS
  Filled 2017-06-30 (×49): qty 50

## 2017-06-30 MED ORDER — ONDANSETRON HCL 4 MG/2ML IJ SOLN: 4.0000 mg | Freq: Four times a day (QID) | INTRAMUSCULAR | Status: DC | PRN

## 2017-06-30 MED ORDER — DIPHENHYDRAMINE HCL 12.5 MG/5ML PO ELIX
12.5000 mg | ORAL_SOLUTION | Freq: Four times a day (QID) | ORAL | Status: DC | PRN
  Filled 2017-06-30: qty 10

## 2017-06-30 NOTE — Consult Note (Signed)
[name] Infectious [facility]    Date of Admission:  06/29/2017   Total days of antibiotics 2        Day 1 Zosyn        Day 2 Fluconazole        Cipro/Flagyl 10/15-10/16       Reason for Consult: Bacteremia / Fungemia     Assessment:  Intraabdominal Abscesses, Bacteremia, Fungemia  Patient presented with LLQ pain fevers, and chills. She was febrile with leukocytosis. CT showed Intraabdominal abscesses. Abscess were drain by surgery during exploratory laparotomy that included colectomy with colostomy and left salpingectomy. - Blood Cultures (10/15): Gram (+) Cocci, Gram (-) rods - BCID 1 (10/15): Step Species, Candida Tropicalis - BCID 2: (10/15): E. Coli   Plan: - Continue Fluconazole - Cipro/Flagyl discontinued - Start Zosyn  - Repeat Blood Cultures, if positive recommend TTE to evaluate for fungal endocarditis - When bacteremia has cleared, exchanged central catheter   Active Problems:   Diverticulitis of colon with perforation   Diverticulitis   Scheduled Meds: . acetaminophen  1,000 mg Oral Q6H  . HYDROmorphone   Intravenous Q4H  . pantoprazole (PROTONIX) IV  40 mg Intravenous QHS   Continuous Infusions: . 0.45 % NaCl with KCl 20 mEq / L 125 mL/hr at 06/30/17 0520  . sodium chloride    . [START ON 07/01/2017] fluconazole (DIFLUCAN) IV    . fluconazole (DIFLUCAN) IV    . lactated ringers 10 mL/hr at 06/30/17 0521  . piperacillin-tazobactam    . piperacillin-tazobactam (ZOSYN)  IV     PRN Meds:.acetaminophen **OR** acetaminophen, diphenhydrAMINE **OR** diphenhydrAMINE, diphenhydrAMINE, hydrALAZINE, naloxone **AND** sodium chloride flush, ondansetron **OR** ondansetron (ZOFRAN) IV  HPI: Lindsay [name] is a 66 y.o. female who presented on 10/15 with approximately 11 days of LLQ pain with associated diarrhea, fevers, and chills. Patient was found to be febrile with leukocytosis of 33.7 in the ED. Patient also presented with elevated LFTs and Cr 2.11  (baseline 0.8). CT abdomen and pelvis WO Contrast showed 2 intraabdominal abscesses; one peridiverticular and one perirectal. Blood Cultures were obtained and patient was initiated on Metronidazole and Ciprofloxacin. IR was consulted for drainage, but were unable to find a good window to access the peridiverticular abscess. Surgery was consulted who opted to perform exploratory laparotomy. The abscesses were located and 2 necrotic regions with frank perforation of the colon into the abscess cavity were noted and the abscess eroded into the left fallopian tube. Abscesses were drained and a colectomy with colostomy as well as left salpingectomy was performed. Blood culture were positive and ID was consulted for bacteremia/fungemia.  Patient is currently afebrile. Tachycardia and tachypnea has resolved and she states she feels much better. Patient does endorse a history of penicillin or "something -illin"  allergy, chart review shows a history of rash with cefazolin.  Review of Systems: Review of Systems  Eyes:       Chronic floaters in vision, sees eye doctor  All other systems reviewed and are negative.   Past Medical History:  Diagnosis Date  . Cellulitis   . Hypertension   . Lower extremity ulceration ([place])   . Lymphedema   . Non-healing wound of lower extremity     Social History  Substance Use Topics  . Smoking status: Never Smoker  . Smokeless tobacco: Never Used  . Alcohol use No    Family History  Problem Relation Age of Onset  . CAD Mother   .  Hypertension Mother    Allergies  Allergen Reactions  . Ancef [Cefazolin] Rash  . Primaxin [Imipenem] Swelling    Developed lip swelling post administration of Primaxin even though she had tolerated this in the past.  . Heparin     (HIT antibody positive; SRA negative  . Vancomycin Rash    OBJECTIVE: Blood pressure 97/72, pulse 75, temperature (!) 96.8 F (36 C), temperature source Axillary, resp. rate 14, height 5\' 3"  (1.6  m), weight 206 lb 4.8 oz (93.6 kg), SpO2 100 %.  Physical Exam  Constitutional: She is oriented to person, place, and time and well-developed, well-nourished, and in no distress.  HENT:  Head: Normocephalic and atraumatic.  Eyes: Conjunctivae and EOM are normal.  Neck:  R IJ double lumen catheter in place  Cardiovascular: Normal rate, regular rhythm and normal heart sounds.   No murmur heard. Pulmonary/Chest: Effort normal and breath sounds normal. No respiratory distress.  Abdominal:  Clean, Dry Abdominal Dressing in place JP drain with serosanguinous fluid  Musculoskeletal: She exhibits no edema or tenderness.  Neurological: She is alert and oriented to person, place, and time.  Skin: Skin is warm and dry.  Scaring from healed ulcers bilateral lower exrtremities    Lab Results Lab Results  Component Value Date   WBC 48.4 (H) 06/30/2017   HGB 7.5 (L) 06/30/2017   HCT 24.4 (L) 06/30/2017   MCV 81.9 06/30/2017   PLT 353 06/30/2017    Lab Results  Component Value Date   CREATININE 1.77 (H) 06/30/2017   BUN 21 (H) 06/30/2017   NA 140 06/30/2017   K 4.4 06/30/2017   CL 105 06/30/2017   CO2 26 06/30/2017    Lab Results  Component Value Date   ALT 44 06/29/2017   AST 72 (H) 06/29/2017   ALKPHOS 256 (H) 06/29/2017   BILITOT 1.4 (H) 06/29/2017     Microbiology: Recent Results (from the past 240 hour(s))  Blood Culture (routine x 2)     Status: None (Preliminary result)   Collection Time: 06/29/17  8:30 AM  Result Value Ref Range Status   Specimen Description BLOOD LEFT HAND  Final   Special Requests IN PEDIATRIC BOTTLE Blood Culture adequate volume  Final   Culture  Setup Time   Final    GRAM POSITIVE COCCI IN CHAINS IN PEDIATRIC BOTTLE Organism ID to follow CRITICAL RESULT CALLED TO, READ BACK BY AND VERIFIED WITH: M. Bufford Spikes.Ph. 9:45 06/30/17 ([email or web address])    Culture GRAM POSITIVE COCCI IN CHAINS  Final   Report Status PENDING  Incomplete  Blood Culture ID Panel  (Reflexed)     Status: Abnormal   Collection Time: 06/29/17  8:30 AM  Result Value Ref Range Status   Enterococcus species NOT DETECTED NOT DETECTED Final   Listeria monocytogenes NOT DETECTED NOT DETECTED Final   Staphylococcus species NOT DETECTED NOT DETECTED Final   Staphylococcus aureus NOT DETECTED NOT DETECTED Final   Streptococcus species DETECTED (A) NOT DETECTED Final    Comment: Not Enterococcus species, Streptococcus agalactiae, Streptococcus pyogenes, or Streptococcus pneumoniae. CRITICAL RESULT CALLED TO, READ BACK BY AND VERIFIED WITH: M. Bufford Spikes.Ph. 9:45 06/30/17 ([email or web address])    Streptococcus agalactiae NOT DETECTED NOT DETECTED Final   Streptococcus pneumoniae NOT DETECTED NOT DETECTED Final   Streptococcus pyogenes NOT DETECTED NOT DETECTED Final   Acinetobacter baumannii NOT DETECTED NOT DETECTED Final   Enterobacteriaceae species NOT DETECTED NOT DETECTED Final   Enterobacter cloacae complex NOT DETECTED NOT DETECTED Final  Escherichia coli NOT DETECTED NOT DETECTED Final   Klebsiella oxytoca NOT DETECTED NOT DETECTED Final   Klebsiella pneumoniae NOT DETECTED NOT DETECTED Final   Proteus species NOT DETECTED NOT DETECTED Final   Serratia marcescens NOT DETECTED NOT DETECTED Final   Haemophilus influenzae NOT DETECTED NOT DETECTED Final   Neisseria meningitidis NOT DETECTED NOT DETECTED Final   Pseudomonas aeruginosa NOT DETECTED NOT DETECTED Final   Candida albicans NOT DETECTED NOT DETECTED Final   Candida glabrata NOT DETECTED NOT DETECTED Final   Candida krusei NOT DETECTED NOT DETECTED Final   Candida parapsilosis NOT DETECTED NOT DETECTED Final   Candida tropicalis DETECTED (A) NOT DETECTED Final    Comment: CRITICAL RESULT CALLED TO, READ BACK BY AND VERIFIED WITH: M. Bufford Spikes.Ph. 9:45 06/30/17 ([email or web address])   Blood Culture (routine x 2)     Status: None (Preliminary result)   Collection Time: 06/29/17  9:15 AM  Result Value Ref Range Status   Specimen Description  BLOOD  Final   Special Requests IN PEDIATRIC BOTTLE Blood Culture adequate volume  Final   Culture  Setup Time   Final    GRAM NEGATIVE RODS IN PEDIATRIC BOTTLE CRITICAL RESULT CALLED TO, READ BACK BY AND VERIFIED WITH: M. LI AT 1030 ON 06/30/17 BY C. JESSUP, MLT.    Culture GRAM NEGATIVE RODS  Final   Report Status PENDING  Incomplete  Blood Culture ID Panel (Reflexed)     Status: Abnormal   Collection Time: 06/29/17  9:15 AM  Result Value Ref Range Status   Enterococcus species NOT DETECTED NOT DETECTED Final   Listeria monocytogenes NOT DETECTED NOT DETECTED Final   Staphylococcus species NOT DETECTED NOT DETECTED Final   Staphylococcus aureus NOT DETECTED NOT DETECTED Final   Streptococcus species NOT DETECTED NOT DETECTED Final   Streptococcus agalactiae NOT DETECTED NOT DETECTED Final   Streptococcus pneumoniae NOT DETECTED NOT DETECTED Final   Streptococcus pyogenes NOT DETECTED NOT DETECTED Final   Acinetobacter baumannii NOT DETECTED NOT DETECTED Final   Enterobacteriaceae species DETECTED (A) NOT DETECTED Final    Comment: Enterobacteriaceae represent a large family of gram-negative bacteria, not a single organism. CRITICAL RESULT CALLED TO, READ BACK BY AND VERIFIED WITH: M. LI, PHARMD AT 1030 ON 06/30/17 BY C. JESSUP, MLT.    Enterobacter cloacae complex NOT DETECTED NOT DETECTED Final   Escherichia coli DETECTED (A) NOT DETECTED Final    Comment: CRITICAL RESULT CALLED TO, READ BACK BY AND VERIFIED WITH: M. LI, PHARMD AT 1030 ON 06/30/17 BY C. JESSUP, MLT.    Klebsiella oxytoca NOT DETECTED NOT DETECTED Final   Klebsiella pneumoniae NOT DETECTED NOT DETECTED Final   Proteus species NOT DETECTED NOT DETECTED Final   Serratia marcescens NOT DETECTED NOT DETECTED Final   Carbapenem resistance NOT DETECTED NOT DETECTED Final   Haemophilus influenzae NOT DETECTED NOT DETECTED Final   Neisseria meningitidis NOT DETECTED NOT DETECTED Final   Pseudomonas aeruginosa NOT  DETECTED NOT DETECTED Final   Candida albicans NOT DETECTED NOT DETECTED Final   Candida glabrata NOT DETECTED NOT DETECTED Final   Candida krusei NOT DETECTED NOT DETECTED Final   Candida parapsilosis NOT DETECTED NOT DETECTED Final   Candida tropicalis NOT DETECTED NOT DETECTED Final  Aerobic/Anaerobic Culture (surgical/deep wound)     Status: None (Preliminary result)   Collection Time: 06/29/17  4:05 PM  Result Value Ref Range Status   Specimen Description ABSCESS ABDOMEN  Final   Special Requests NONE  Final  Gram Stain   Final    ABUNDANT WBC PRESENT,BOTH PMN AND MONONUCLEAR ABUNDANT GRAM POSITIVE COCCI IN PAIRS MODERATE GRAM NEGATIVE RODS    Culture PENDING  Incomplete   Report Status PENDING  Incomplete  Aerobic/Anaerobic Culture (surgical/deep wound)     Status: None (Preliminary result)   Collection Time: 06/29/17  4:35 PM  Result Value Ref Range Status   Specimen Description TISSUE  Final   Special Requests PELVIC ABCESS CAVITY  Final   Gram Stain   Final    ABUNDANT WBC PRESENT,BOTH PMN AND MONONUCLEAR MODERATE GRAM POSITIVE COCCI IN PAIRS RARE GRAM NEGATIVE RODS    Culture PENDING  Incomplete   Report Status PENDING  Incomplete  MRSA PCR Screening     Status: None   Collection Time: 06/29/17 11:33 PM  Result Value Ref Range Status   MRSA by PCR NEGATIVE NEGATIVE Final    Comment:        The GeneXpert MRSA Assay (FDA approved for NASAL specimens only), is one component of a comprehensive MRSA colonization surveillance program. It is not intended to diagnose MRSA infection nor to guide or monitor treatment for MRSA infections.     Pearson Grippe, DO IM PGY-1 Pager: [phone]

## 2017-06-30 NOTE — Progress Notes (Signed)
Was told in report pt has PCA pump which is currently still connected. Looks like order was d/c'd this AM at 0000. Messaged Dr. Dema Severin re: do you want to d/c?

## 2017-06-30 NOTE — Progress Notes (Addendum)
Subjective No acute events. Doing well, feeling much better relative to pre-procedure. Denies n/v. No flatus/stool from appliance as of yet. Has not ambulated.  Objective: Vital signs in last 24 hours: Temp:  [96.5 F (35.8 C)-101 F (38.3 C)] 96.5 F (35.8 C) (10/16 0718) Pulse Rate:  [76-119] 76 (10/16 0718) Resp:  [17-43] 17 (10/16 0718) BP: (95-123)/(49-78) 123/78 (10/16 0718) SpO2:  [92 %-99 %] 97 % (10/16 0718) Arterial Line BP: (84-118)/(39-65) 118/63 (10/15 2245) Weight:  [93.6 kg (206 lb 4.8 oz)] 93.6 kg (206 lb 4.8 oz) (10/15 2300) Last BM Date: 06/29/17 (prior to surgery)  Intake/Output from previous day: 10/15 0701 - 10/16 0700 In: 5869.4 [P.O.:240; I.V.:3089.4; IV Piggyback:2400] Out: 1465 [Urine:995; Drains:70; Blood:400] Intake/Output this shift: No intake/output data recorded.  Gen: NAD, comfortable CV: RRR Pulm: Normal work of breathing Abd: Soft, appropriately tender around wound; wound clean and dry without erythema or drainage. Stoma pink and patent. No gas/stool in appliance Ext: SCDs in place  UOP: 800cc/12hrs JP: 70cc - serosang  Lab Results: CBC   Recent Labs  06/29/17 0824 06/30/17 0543  WBC 33.7* 48.4*  HGB 10.8* 7.5*  HCT 34.2* 24.4*  PLT 465* 353   BMET  Recent Labs  06/29/17 0824 06/30/17 0543  NA 139 140  K 3.4* 4.4  CL 99* 105  CO2 22 26  GLUCOSE 130* 117*  BUN 21* 21*  CREATININE 2.11* 1.77*  CALCIUM 8.2* 7.6*   PT/INR No results for input(s): LABPROT, INR in the last 72 hours. ABG No results for input(s): PHART, HCO3 in the last 72 hours.  Invalid input(s): PCO2, PO2  Studies/Results:  Anti-infectives: Anti-infectives    Start     Dose/Rate Route Frequency Ordered Stop   06/29/17 2130  ciprofloxacin (CIPRO) IVPB 400 mg     400 mg 200 mL/hr over 60 Minutes Intravenous Every 12 hours 06/29/17 1106     06/29/17 1730  metroNIDAZOLE (FLAGYL) IVPB 500 mg     500 mg 100 mL/hr over 60 Minutes Intravenous Every 8  hours 06/29/17 1106     06/29/17 1000  metroNIDAZOLE (FLAGYL) IVPB 500 mg  Status:  Discontinued     500 mg 100 mL/hr over 60 Minutes Intravenous Every 8 hours 06/29/17 0915 06/29/17 1117   06/29/17 1000  ciprofloxacin (CIPRO) IVPB 400 mg  Status:  Discontinued     400 mg 200 mL/hr over 60 Minutes Intravenous Every 12 hours 06/29/17 0915 06/29/17 1117     Assessment/Plan: Patient Active Problem List   Diagnosis Date Noted  . Diverticulitis of colon with perforation 06/29/2017  . Diverticulitis 06/29/2017  . Erythema nodosum 01/31/2016  . Hypothyroidism 01/31/2016  . Adenopathy   . Wound infection   . Cellulitis of both lower extremities 01/28/2016  . Lymphedema of both lower extremities 01/28/2016  . Increased anion gap metabolic acidosis 05/39/7673  . Allergic angioedema 06/02/2014  . Ulcer of left lower leg ([place]) 06/01/2014  . History of HIT (heparin-induced thrombocytopenia)  09/24/2013  . Thrombocytopenia, unspecified ([place]) 09/21/2013  . Anemia 09/21/2013  . Drug induced neutropenia(288.03) 09/21/2013  . HTN (hypertension) 08/21/2013  . OSA (obstructive sleep apnea) 12/25/2008   48F with hx of HTN and ?proliferative diabetic retinopathy - POD#1 s/p exlap, sigmoid colectomy, left salpingectomy, repair of 18mm cystotomy for acute gangrenous sigmoid diverticulitis  -Discuss case with medicine today for potential options for chemical DVT prophylaxis in the setting of possible prior HIT -Perioperative abx - will d/c 10/17 -WBC 48 - afebrile, normotensive,  normal HR, reassurring abdominal exam, adequate UOP; attributing bump in WBC to her procedure - drained substantial amount of abscess fluid yesterday evening during her case -Hgb 7.5 from 10preop - will repeat this afternoon and trend -NPO except chips/sips liquids today; MIVF -Ambulate and out of bed to chair today -Continue wet to dry dressing to midline wound - will plan for wound vac in coming days -WOCN seeing for stoma  appliance -PT/OT -PPx: SCDs, discuss chem ppx with medicine; PPI -Dispo: Remain in stepdown today   LOS: 1 day   Sharon Mt. Dema Severin, M.D. [facility]  Addendum Discussed case with internal medicine - given AKI/renal fxn recommending SCDs at this time and no formal chemical prophylaxis given hx of HIT

## 2017-06-30 NOTE — Evaluation (Signed)
Physical Therapy Evaluation Patient Details Name: Lindsay [name] MRN: [id number] DOB: 1951-08-25 Today's Date: 06/30/2017   History of Present Illness  Pt is a 66 y.o. female admitted with perforated diverticulitis. She underwent laparotomy/sigmoid colectomy 06-29-17.  Clinical Impression  Pt admitted with above diagnosis. Pt currently with functional limitations due to the deficits listed below (see PT Problem List). On eval, pt required mod assist bed mobility, min assist transfers and min assist ambulation with RW 10 feet. Pt with Hgb of 7.5 at time of eval. Pt reports no dizziness. PTA pt independent with all mobility, ADLS, IADLS, and driving. She demo good potential to progress quickly with mobility and return to independent level. Pt will benefit from skilled PT to increase their independence and safety with mobility to allow discharge to the venue listed below.  PT to follow acutely. No follow up services indicated at this time. DME recommendations to be updated as pt is further mobilized.     Follow Up Recommendations Supervision/Assistance - 24 hour;No PT follow up    Equipment Recommendations  Other (comment) (TBD)    Recommendations for Other Services       Precautions / Restrictions Precautions Precautions: Fall      Mobility  Bed Mobility Overal bed mobility: Needs Assistance Bed Mobility: Supine to Sit;Sit to Supine     Supine to sit: Mod assist;HOB elevated     General bed mobility comments: +rail, increased time and effort, verbal cues for sequencing  Transfers Overall transfer level: Needs assistance Equipment used: Rolling walker (2 wheeled) Transfers: Sit to/from Stand Sit to Stand: Min assist         General transfer comment: verbal cues for hand placement, assist to power up  Ambulation/Gait Ambulation/Gait assistance: Min assist Ambulation Distance (Feet): 10 Feet Assistive device: Rolling walker (2 wheeled) Gait Pattern/deviations:  Step-through pattern;Decreased stride length Gait velocity: decreased Gait velocity interpretation: Below normal speed for age/gender General Gait Details: slow, steady gait, assist with balance and RW management  Stairs            Wheelchair Mobility    Modified Rankin (Stroke Patients Only)       Balance Overall balance assessment: Needs assistance Sitting-balance support: No upper extremity supported;Feet supported Sitting balance-Leahy Scale: Good     Standing balance support: Bilateral upper extremity supported;During functional activity Standing balance-Leahy Scale: Fair Standing balance comment: RW for ambulation, no LOB noted                             Pertinent Vitals/Pain Pain Assessment: Faces Faces Pain Scale: Hurts even more Pain Location: abdomen with mobility Pain Descriptors / Indicators: Grimacing;Guarding Pain Intervention(s): Monitored during session;Repositioned;PCA encouraged    Home Living Family/patient expects to be discharged to:: Private residence Living Arrangements: Non-relatives/Friends Available Help at Discharge: Family;Available 24 hours/day Type of Home: House Home Access: Stairs to enter Entrance Stairs-Rails: Right;Left;Can reach both Entrance Stairs-Number of Steps: 4 Home Layout: One level Home Equipment: None      Prior Function Level of Independence: Independent               Hand Dominance   Dominant Hand: Right    Extremity/Trunk Assessment   Upper Extremity Assessment Upper Extremity Assessment: Defer to OT evaluation    Lower Extremity Assessment Lower Extremity Assessment: Overall WFL for tasks assessed    Cervical / Trunk Assessment Cervical / Trunk Assessment: Normal  Communication   Communication: No difficulties  Cognition Arousal/Alertness: Awake/alert Behavior During Therapy: WFL for tasks assessed/performed Overall Cognitive Status: Within Functional Limits for tasks  assessed                                        General Comments      Exercises     Assessment/Plan    PT Assessment Patient needs continued PT services  PT Problem List Decreased activity tolerance;Decreased balance;Decreased mobility;Decreased knowledge of use of DME;Pain       PT Treatment Interventions DME instruction;Gait training;Stair training;Functional mobility training;Balance training;Therapeutic exercise;Therapeutic activities;Patient/family education    PT Goals (Current goals can be found in the Care Plan section)  Acute Rehab PT Goals Patient Stated Goal: home PT Goal Formulation: With patient Time For Goal Achievement: 07/14/17 Potential to Achieve Goals: Good    Frequency Min 3X/week   Barriers to discharge        Co-evaluation               AM-PAC PT "6 Clicks" Daily Activity  Outcome Measure Difficulty turning over in bed (including adjusting bedclothes, sheets and blankets)?: A Lot Difficulty moving from lying on back to sitting on the side of the bed? : Unable Difficulty sitting down on and standing up from a chair with arms (e.g., wheelchair, bedside commode, etc,.)?: Unable Help needed moving to and from a bed to chair (including a wheelchair)?: A Little Help needed walking in hospital room?: A Little Help needed climbing 3-5 steps with a railing? : A Lot 6 Click Score: 12    End of Session Equipment Utilized During Treatment: Gait belt;Oxygen Activity Tolerance: Patient tolerated treatment well Patient left: in chair;with call bell/phone within reach;with family/visitor present;with nursing/sitter in room Nurse Communication: Mobility status PT Visit Diagnosis: Difficulty in walking, not elsewhere classified (R26.2);Pain    Time: 0952-1020 PT Time Calculation (min) (ACUTE ONLY): 28 min   Charges:   PT Evaluation $PT Eval Moderate Complexity: 1 Mod PT Treatments $Gait Training: 8-22 mins   PT G Codes:         Lorrin Goodell, PT  Office # [phone] Pager [phone]   Lorriane Shire 06/30/2017, 12:12 PM

## 2017-06-30 NOTE — Progress Notes (Signed)
PHARMACY - PHYSICIAN COMMUNICATION CRITICAL VALUE ALERT - BLOOD CULTURE IDENTIFICATION (BCID)  Second BCID result on patient reported E.coli.   Name of physician (or Provider) Contacted: C. White 1/2 BCx positive for E. Coli. Also has a positive BCx set for strep species and candida tropicalis.  Changes to prescribed antibiotics required: Stopped flagyl and cipro. Will start Zosyn. Continue fluconazole.   Laqueta Jean 06/30/2017  11:07 AM

## 2017-06-30 NOTE — Progress Notes (Signed)
Pharmacy Antibiotic Note  Lindsay [name] is a 66 y.o. female admitted on 06/29/2017. Now with positive blood cultures, possible for candidemia and strep bacteremia. Pharmacy has been consulted for fluconazole and zosyn dosing.   Pt presented with 11 day hx of LLQ abdominal pain. CT scan revealed diverticulitis with 2 intra-abdominal abscess s/p exlap, sigmoid colectomy, left salpingectomy, repair of 31mm cystotomy for acute gangrenous sigmoid diverticulitis. Since admission, pt's WBC has trended up from 33.7 to 48.4. Pt was febrile upon presentation, currently afebrile. AST elevated at 72, and ALP elevated at 256.  BCx 1/2 sets is positive for candida tropicalis and strep species. The second set of BCx is positive for E.coli. Fluconazole added for candida coverage, will give 800 mg loading dose and 400 mg q24 maintenance dose given site of infection and improving Scr. Cipro and Flagyl switched to Zosyn for better strep and E.coli coverage. Aware of listed Ancef and imipenem allergy, but discussed case with ID and willing to do a trial.   Plan: Stop ciprofloxacin and Flagyl Start fluconazole IV 800 mg loading dose once, then IV 400 mg q24h Start Zosyn 3.375g q8h, 4 hour infusion Monitor renal function, WBC, LFTs, and overall clinical course   Height: 5\' 3"  (160 cm) Weight: 206 lb 4.8 oz (93.6 kg) IBW/kg (Calculated) : 52.4  Temp (24hrs), Avg:98 F (36.7 C), Min:96.5 F (35.8 C), Max:101 F (38.3 C)   Recent Labs Lab 06/29/17 0824 06/29/17 0838 06/29/17 1156 06/29/17 1632 06/30/17 0543  WBC 33.7*  --   --   --  48.4*  CREATININE 2.11*  --   --   --  1.77*  LATICACIDVEN  --  6.44* 4.07* 1.7  --     Estimated Creatinine Clearance: 34.9 mL/min (A) (by C-G formula based on SCr of 1.77 mg/dL (H)).    Allergies  Allergen Reactions  . Ancef [Cefazolin] Rash  . Primaxin [Imipenem] Swelling    Developed lip swelling post administration of Primaxin even though she had tolerated this in the  past.  . Heparin     (HIT antibody positive; SRA negative  . Vancomycin Rash    Antimicrobials this admission: Cipro 10/15 >> 10/16 Flagyl 10/15 >> 10/16 Zosyn 10/16 >> Fluconazole 10/16>>  Dose adjustments this admission: None  Microbiology results: 10/15 BCx: One set positive strep species and candida tropicalis. Second set positive for E.coli. 10/15 abdominal abscess: Abundant GPC in pairs and moderate GNR, pending   10/15 MRSA PCR: Negative   Thank you for allowing pharmacy to be a part of this patient's care.  Laqueta Jean 06/30/2017 10:18 AM

## 2017-06-30 NOTE — Progress Notes (Signed)
PHARMACY - PHYSICIAN COMMUNICATION CRITICAL VALUE ALERT - BLOOD CULTURE IDENTIFICATION (BCID)  Results for orders placed or performed during the hospital encounter of 06/29/17  Blood Culture ID Panel (Reflexed) (Collected: 06/29/2017  8:30 AM)  Result Value Ref Range   Enterococcus species NOT DETECTED NOT DETECTED   Listeria monocytogenes NOT DETECTED NOT DETECTED   Staphylococcus species NOT DETECTED NOT DETECTED   Staphylococcus aureus NOT DETECTED NOT DETECTED   Streptococcus species DETECTED (A) NOT DETECTED   Streptococcus agalactiae NOT DETECTED NOT DETECTED   Streptococcus pneumoniae NOT DETECTED NOT DETECTED   Streptococcus pyogenes NOT DETECTED NOT DETECTED   Acinetobacter baumannii NOT DETECTED NOT DETECTED   Enterobacteriaceae species NOT DETECTED NOT DETECTED   Enterobacter cloacae complex NOT DETECTED NOT DETECTED   Escherichia coli NOT DETECTED NOT DETECTED   Klebsiella oxytoca NOT DETECTED NOT DETECTED   Klebsiella pneumoniae NOT DETECTED NOT DETECTED   Proteus species NOT DETECTED NOT DETECTED   Serratia marcescens NOT DETECTED NOT DETECTED   Haemophilus influenzae NOT DETECTED NOT DETECTED   Neisseria meningitidis NOT DETECTED NOT DETECTED   Pseudomonas aeruginosa NOT DETECTED NOT DETECTED   Candida albicans NOT DETECTED NOT DETECTED   Candida glabrata NOT DETECTED NOT DETECTED   Candida krusei NOT DETECTED NOT DETECTED   Candida parapsilosis NOT DETECTED NOT DETECTED   Candida tropicalis DETECTED (A) NOT DETECTED    Name of physician (or Provider) Contacted: C.White  One set of cultures is growing strep species and the other set is growing candida tropicalis.  Changes to prescribed antibiotics required: Can continue cipro and flagyl. Added fluconazole for candida tropicalis coverage.   Laqueta Jean 06/30/2017  10:12 AM

## 2017-06-30 NOTE — Care Management Note (Signed)
Case Management Note  Patient Details  Name: Lindsay [name] MRN: [id number] Date of Birth: 1950/11/23  Subjective/Objective:    polymicrobial bacteremia 2/2 intra-abd infection, diverticulitis of colon with perforation, s/p exlap, sigmoid colectomy, left salpingectomy, repair of 44mm cystotomy                            Action/Plan: Discharge Planning: NCM spoke to pt and offered choice for HH/list provided. Had [facility] in the past. Has RW, wheelchair, and bedside commode at home. Pt states she will purchase tub bench out of pocket. Waiting PT/OT recommendations.   PCP Merrilee Seashore MD  Expected Discharge Date:  07/03/17               Expected Discharge Plan:  Home/Self Care  In-House Referral:  NA  Discharge planning Services  CM Consult  Post Acute Care Choice:  Home Health Choice offered to:  Adult Children, Patient  DME Arranged:  N/A DME Agency:  NA  HH Arranged:    [facility] Agency:  [facility]  Status of Service:  In process, will continue to follow  If discussed at Long Length of Stay Meetings, dates discussed:    Additional Comments:  Erenest Rasher, RN 06/30/2017, 12:46 PM

## 2017-06-30 NOTE — Consult Note (Addendum)
[facility] Nurse ostomy consult note Surgical team following for assessment and plan of care for abd wound. Stoma type/location:  Pt had surgery yesterday for colostomy; current pouch is intact with good seal, no stool or flatus.  Briefly discussed pouching routines and patient asked appropriate questions; will plan to begin teaching sessions when pt is out of ICU. Extra supplies left at the bedside. Ostomy pouching: 1pc.  Enrolled patient in [facility] program: No Julien Girt MSN, [place], [place], [place], [place]

## 2017-06-30 NOTE — Plan of Care (Signed)
Problem: Education: Goal: Knowledge of [facility] Education information/materials will improve Outcome: Progressing Discussed with patient plan of care for the evening, pain management and movement with some teach back back displayed.

## 2017-06-30 NOTE — Evaluation (Signed)
Occupational Therapy Evaluation Patient Details Name: Lindsay [name] MRN: [id number] DOB: Feb 14, 1951 Today's Date: 06/30/2017    History of Present Illness Pt is a 66 y.o. female admitted with perforated diverticulitis. She underwent laparotomy/sigmoid colectomy 06-29-17.   Clinical Impression   Pt admitted with above. She demonstrates the below listed deficits and will benefit from continued OT to maximize safety and independence with BADLs.  Pt presents to OT with abdominal pain, generalized weakness, decreased activity tolerance.  PTA she lived with a friend and was fully independent.  Currently, she requires max A for LB ADLs and min A for functional transfers.  She is very motivated to regain independence, and anticipate good progress.  Feel she may benefit from [facility].       Follow Up Recommendations  Home health OT;Supervision/Assistance - 24 hour    Equipment Recommendations  Tub/shower bench    Recommendations for Other Services       Precautions / Restrictions Precautions Precautions: Fall      Mobility Bed Mobility               General bed mobility comments: up in recliner   Transfers Overall transfer level: Needs assistance Equipment used: Rolling walker (2 wheeled) Transfers: Sit to/from [facility] Sit to Stand: Min assist Stand pivot transfers: Min assist       General transfer comment: min A to steady and mod cues for hand placement     Balance Overall balance assessment: Needs assistance Sitting-balance support: No upper extremity supported;Feet supported Sitting balance-Leahy Scale: Good     Standing balance support: Bilateral upper extremity supported;During functional activity Standing balance-Leahy Scale: Poor Standing balance comment: reliant on UE support                            ADL either performed or assessed with clinical judgement   ADL Overall ADL's : Needs assistance/impaired Eating/Feeding:  NPO   Grooming: Wash/dry hands;Wash/dry face;Oral care;Brushing hair;Minimal assistance;Standing   Upper Body Bathing: Minimal assistance;Sitting   Lower Body Bathing: Maximal assistance;Sit to/from stand   Upper Body Dressing : Minimal assistance;Sitting   Lower Body Dressing: Total assistance;Sit to/from stand   Toilet Transfer: Minimal assistance;Ambulation;Comfort height toilet;Grab bars;RW   Toileting- Clothing Manipulation and Hygiene: Maximal assistance;Sit to/from stand       Functional mobility during ADLs: Minimal assistance;Rolling walker General ADL Comments: limited by abdominal discomfort      Vision         Perception     Praxis      Pertinent Vitals/Pain Pain Assessment: No/denies pain Pain Location: abdomen - has been using PCA      Hand Dominance Right   Extremity/Trunk Assessment Upper Extremity Assessment Upper Extremity Assessment: Generalized weakness   Lower Extremity Assessment Lower Extremity Assessment: Defer to PT evaluation   Cervical / Trunk Assessment Cervical / Trunk Assessment: Normal   Communication Communication Communication: No difficulties   Cognition Arousal/Alertness: Awake/alert Behavior During Therapy: WFL for tasks assessed/performed                                       General Comments  VSS     Exercises     Shoulder Instructions      Home Living Family/patient expects to be discharged to:: Private residence Living Arrangements: Non-relatives/Friends Available Help at Discharge: Family;Available 24 hours/day Type of Home:  House Home Access: Stairs to enter [employer] of Steps: 4 Entrance Stairs-Rails: Right;Left;Can reach both Home Layout: One level     Bathroom Shower/Tub: Teacher, early years/pre: Standard     Home Equipment: Environmental consultant - 2 wheels;Bedside commode          Prior Functioning/Environment Level of Independence: Independent                  OT Problem List: Decreased strength;Decreased activity tolerance;Impaired balance (sitting and/or standing);Decreased safety awareness;Decreased knowledge of use of DME or AE;Obesity;Pain      OT Treatment/Interventions: Self-care/ADL training;DME and/or AE instruction;Therapeutic activities;Patient/family education;Balance training    OT Goals(Current goals can be found in the care plan section) Acute Rehab OT Goals Patient Stated Goal: home OT Goal Formulation: With patient/family Time For Goal Achievement: 07/14/17 Potential to Achieve Goals: Good  OT Frequency: Min 2X/week   Barriers to D/C: Decreased caregiver support          Co-evaluation              AM-PAC PT "6 Clicks" Daily Activity     Outcome Measure Help from another person eating meals?: None Help from another person taking care of personal grooming?: A Little Help from another person toileting, which includes using toliet, bedpan, or urinal?: A Lot Help from another person bathing (including washing, rinsing, drying)?: A Lot Help from another person to put on and taking off regular upper body clothing?: A Lot Help from another person to put on and taking off regular lower body clothing?: Total 6 Click Score: 14   End of Session Equipment Utilized During Treatment: Rolling walker Nurse Communication: Mobility status  Activity Tolerance: Patient tolerated treatment well Patient left: in chair;with call bell/phone within reach;with family/visitor present  OT Visit Diagnosis: Unsteadiness on feet (R26.81);Pain Pain - part of body:  (abdomen )                Time: 6433-2951 OT Time Calculation (min): 29 min Charges:  OT General Charges $OT Visit: 1 Visit OT Evaluation $OT Eval Moderate Complexity: 1 Mod OT Treatments $Therapeutic Activity: 8-22 mins G-Codes:     [facility], OTR/L [phone]   Lucille Passy M 06/30/2017, 4:41 PM

## 2017-06-30 NOTE — Consult Note (Signed)
[facility]  Total days of antibiotics 2               Reason for Consult: polymicrobial bacteremia 2/2 intra-abd infection   Referring Physician: white  Active Problems:   Diverticulitis of colon with perforation   Diverticulitis    HPI: Lindsay [name] is a 66 y.o. female with 11 day history of LLQ pain with associated fever, chills, drenching nightsweats was admitted on 10/15 for evaluation. Found to have fever of 103F, leukocytosis of 33K, CT showing large sigmoid diverticulitis with 9 x 6 cm paradiverticular abscess and prerectal space abscess measuring 3.7 x 3.7cm. She was taken to the OR by general surgery by dr white who performed ex lap with hartmann's procedure with end transverse colsotmy and left salpingectomy and repair of cystotomy to remove necrotic perforated sigmoid colon and large pericolic abscess. She has 19Fr round blake drain in place, as well as right IJ to venous access. Blood cx grew strep species plus candida tropicalis. She was initially placed on cipro plus metronidazole which has now been switched to piptazo plus fluconazole. She feels sore from surgery but improved. Less fevers.  I have seen and examined the patient with the resident and agree with his note.  I have reviewed her CT imaging that reveals large pericolic abscess  Past Medical History:  Diagnosis Date  . Cellulitis   . Hypertension   . Lower extremity ulceration ([place])   . Lymphedema   . Non-healing wound of lower extremity     Allergies:  Allergies  Allergen Reactions  . Ancef [Cefazolin] Rash  . Primaxin [Imipenem] Swelling    Developed lip swelling post administration of Primaxin even though she had tolerated this in the past.  . Heparin     (HIT antibody positive; SRA negative  . Vancomycin Rash    Current antibiotics:   MEDICATIONS: . acetaminophen  1,000 mg Oral Q6H  . HYDROmorphone   Intravenous Q4H  . pantoprazole (PROTONIX) IV  40 mg  Intravenous QHS    Social History  Substance Use Topics  . Smoking status: Never Smoker  . Smokeless tobacco: Never Used  . Alcohol use No    Family History  Problem Relation Age of Onset  . CAD Mother   . Hypertension Mother      Review of Systems  Constitutional: positive for fever, chills, diaphoresis, activity change, appetite change, fatigue and unexpected weight change.  HENT: Negative for congestion, sore throat, rhinorrhea, sneezing, trouble swallowing and sinus pressure.  Eyes: Negative for photophobia and visual disturbance.  Respiratory: Negative for cough, chest tightness, shortness of breath, wheezing and stridor.  Cardiovascular: Negative for chest pain, palpitations and leg swelling.  Gastrointestinal: positive for diarrhea earlier in the course of symptoms.Negative for nausea, vomiting, abdominal pain, diarrhea, constipation, blood in stool, abdominal distention and anal bleeding.  Genitourinary: Negative for dysuria, hematuria, flank pain and difficulty urinating.  Musculoskeletal: Negative for myalgias, back pain, joint swelling, arthralgias and gait problem.  Skin: Negative for color change, pallor, rash and wound.  Neurological: Negative for dizziness, tremors, weakness and light-headedness.  Hematological: Negative for adenopathy. Does not bruise/bleed easily.  Psychiatric/Behavioral: Negative for behavioral problems, confusion, sleep disturbance, dysphoric mood, decreased concentration and agitation.     OBJECTIVE: Temp:  [96.5 F (35.8 C)-101 F (38.3 C)] 96.5 F (35.8 C) (10/16 0718) Pulse Rate:  [76-119] 76 (10/16 0718) Resp:  [17-40] 17 (10/16 0718) BP: (95-123)/(55-78) 123/78 (10/16 0718) SpO2:  [92 %-  99 %] 97 % (10/16 0718) Arterial Line BP: (84-118)/(39-65) 118/63 (10/15 2245) Weight:  [206 lb 4.8 oz (93.6 kg)] 206 lb 4.8 oz (93.6 kg) (10/15 2300) Physical Exam  Constitutional:  oriented to person, place, and time. appears well-developed and  well-nourished. No distress.  HENT: [place]/AT, PERRLA, no scleral icterus Mouth/Throat: Oropharynx is clear and moist. No oropharyngeal exudate. Pale mucosa Neck = supple, no nuchal rigidity, right ij Cardiovascular: Normal rate, regular rhythm and normal heart sounds. Exam reveals no gallop and no friction rub.  No murmur heard.  Pulmonary/Chest: Effort normal and breath sounds normal. No respiratory distress.  has no wheezes. Abdominal: Soft. Bowel sounds are normal. Midline is bandaged. Colostomy on RLQ and drain with serosanginsou fluid. gu = foley in place Neurological: alert and oriented to person, place, and time.  Skin: Skin is warm and dry. No rash noted. No erythema.  Psychiatric: a normal mood and affect.  behavior is normal.    LABS: Results for orders placed or performed during the hospital encounter of 06/29/17 (from the past 48 hour(s))  Lipase, blood     Status: None   Collection Time: 06/29/17  8:24 AM  Result Value Ref Range   Lipase 22 11 - 51 U/L  Comprehensive metabolic panel     Status: Abnormal   Collection Time: 06/29/17  8:24 AM  Result Value Ref Range   Sodium 139 135 - 145 mmol/L   Potassium 3.4 (L) 3.5 - 5.1 mmol/L   Chloride 99 (L) 101 - 111 mmol/L   CO2 22 22 - 32 mmol/L   Glucose, Bld 130 (H) 65 - 99 mg/dL   BUN 21 (H) 6 - 20 mg/dL   Creatinine, Ser 2.11 (H) 0.44 - 1.00 mg/dL   Calcium 8.2 (L) 8.9 - 10.3 mg/dL   Total Protein 6.6 6.5 - 8.1 g/dL   Albumin 2.3 (L) 3.5 - 5.0 g/dL   AST 72 (H) 15 - 41 U/L   ALT 44 14 - 54 U/L   Alkaline Phosphatase 256 (H) 38 - 126 U/L   Total Bilirubin 1.4 (H) 0.3 - 1.2 mg/dL   GFR calc non Af Amer 24 (L) >60 mL/min   GFR calc Af Amer 27 (L) >60 mL/min    Comment: (NOTE) The eGFR has been calculated using the CKD EPI equation. This calculation has not been validated in all clinical situations. eGFR's persistently <60 mL/min signify possible Chronic Kidney Disease.    Anion gap 18 (H) 5 - 15  CBC     Status:  Abnormal   Collection Time: 06/29/17  8:24 AM  Result Value Ref Range   WBC 33.7 (H) 4.0 - 10.5 K/uL   RBC 4.28 3.87 - 5.11 MIL/uL   Hemoglobin 10.8 (L) 12.0 - 15.0 g/dL   HCT 34.2 (L) 36.0 - 46.0 %   MCV 79.9 78.0 - 100.0 fL   MCH 25.2 (L) 26.0 - 34.0 pg   MCHC 31.6 30.0 - 36.0 g/dL   RDW 16.5 (H) 11.5 - 15.5 %   Platelets 465 (H) 150 - 400 K/uL  Blood Culture (routine x 2)     Status: None (Preliminary result)   Collection Time: 06/29/17  8:30 AM  Result Value Ref Range   Specimen Description BLOOD LEFT HAND    Special Requests IN PEDIATRIC BOTTLE Blood Culture adequate volume    Culture  Setup Time      GRAM POSITIVE COCCI IN CHAINS IN PEDIATRIC BOTTLE Organism ID to follow CRITICAL  RESULT CALLED TO, READ BACK BY AND VERIFIED WITH: M. Bufford Spikes.Ph. 9:45 06/30/17 ([email or web address])    Culture GRAM POSITIVE COCCI IN CHAINS    Report Status PENDING   Blood Culture ID Panel (Reflexed)     Status: Abnormal   Collection Time: 06/29/17  8:30 AM  Result Value Ref Range   Enterococcus species NOT DETECTED NOT DETECTED   Listeria monocytogenes NOT DETECTED NOT DETECTED   Staphylococcus species NOT DETECTED NOT DETECTED   Staphylococcus aureus NOT DETECTED NOT DETECTED   Streptococcus species DETECTED (A) NOT DETECTED    Comment: Not Enterococcus species, Streptococcus agalactiae, Streptococcus pyogenes, or Streptococcus pneumoniae. CRITICAL RESULT CALLED TO, READ BACK BY AND VERIFIED WITH: M. Bufford Spikes.Ph. 9:45 06/30/17 ([email or web address])    Streptococcus agalactiae NOT DETECTED NOT DETECTED   Streptococcus pneumoniae NOT DETECTED NOT DETECTED   Streptococcus pyogenes NOT DETECTED NOT DETECTED   Acinetobacter baumannii NOT DETECTED NOT DETECTED   Enterobacteriaceae species NOT DETECTED NOT DETECTED   Enterobacter cloacae complex NOT DETECTED NOT DETECTED   Escherichia coli NOT DETECTED NOT DETECTED   Klebsiella oxytoca NOT DETECTED NOT DETECTED   Klebsiella pneumoniae NOT DETECTED NOT DETECTED   Proteus  species NOT DETECTED NOT DETECTED   Serratia marcescens NOT DETECTED NOT DETECTED   Haemophilus influenzae NOT DETECTED NOT DETECTED   Neisseria meningitidis NOT DETECTED NOT DETECTED   Pseudomonas aeruginosa NOT DETECTED NOT DETECTED   Candida albicans NOT DETECTED NOT DETECTED   Candida glabrata NOT DETECTED NOT DETECTED   Candida krusei NOT DETECTED NOT DETECTED   Candida parapsilosis NOT DETECTED NOT DETECTED   Candida tropicalis DETECTED (A) NOT DETECTED    Comment: CRITICAL RESULT CALLED TO, READ BACK BY AND VERIFIED WITH: M. Bufford Spikes.Ph. 9:45 06/30/17 (wilsonm)   I-Stat CG4 Lactic Acid, ED     Status: Abnormal   Collection Time: 06/29/17  8:38 AM  Result Value Ref Range   Lactic Acid, Venous 6.44 (HH) 0.5 - 1.9 mmol/L   Comment NOTIFIED PHYSICIAN   Blood Culture (routine x 2)     Status: None (Preliminary result)   Collection Time: 06/29/17  9:15 AM  Result Value Ref Range   Specimen Description BLOOD    Special Requests IN PEDIATRIC BOTTLE Blood Culture adequate volume    Culture  Setup Time      GRAM NEGATIVE RODS IN PEDIATRIC BOTTLE CRITICAL RESULT CALLED TO, READ BACK BY AND VERIFIED WITH: M. LI AT 1030 ON 06/30/17 BY C. JESSUP, MLT.    Culture GRAM NEGATIVE RODS    Report Status PENDING   Blood Culture ID Panel (Reflexed)     Status: Abnormal   Collection Time: 06/29/17  9:15 AM  Result Value Ref Range   Enterococcus species NOT DETECTED NOT DETECTED   Listeria monocytogenes NOT DETECTED NOT DETECTED   Staphylococcus species NOT DETECTED NOT DETECTED   Staphylococcus aureus NOT DETECTED NOT DETECTED   Streptococcus species NOT DETECTED NOT DETECTED   Streptococcus agalactiae NOT DETECTED NOT DETECTED   Streptococcus pneumoniae NOT DETECTED NOT DETECTED   Streptococcus pyogenes NOT DETECTED NOT DETECTED   Acinetobacter baumannii NOT DETECTED NOT DETECTED   Enterobacteriaceae species DETECTED (A) NOT DETECTED    Comment: Enterobacteriaceae represent a large family of  gram-negative bacteria, not a single organism. CRITICAL RESULT CALLED TO, READ BACK BY AND VERIFIED WITH: M. LI, PHARMD AT 1030 ON 06/30/17 BY C. JESSUP, MLT.    Enterobacter cloacae complex NOT DETECTED NOT DETECTED   Escherichia coli DETECTED (A) NOT  DETECTED    Comment: CRITICAL RESULT CALLED TO, READ BACK BY AND VERIFIED WITH: M. LI, PHARMD AT 1030 ON 06/30/17 BY C. JESSUP, MLT.    Klebsiella oxytoca NOT DETECTED NOT DETECTED   Klebsiella pneumoniae NOT DETECTED NOT DETECTED   Proteus species NOT DETECTED NOT DETECTED   Serratia marcescens NOT DETECTED NOT DETECTED   Carbapenem resistance NOT DETECTED NOT DETECTED   Haemophilus influenzae NOT DETECTED NOT DETECTED   Neisseria meningitidis NOT DETECTED NOT DETECTED   Pseudomonas aeruginosa NOT DETECTED NOT DETECTED   Candida albicans NOT DETECTED NOT DETECTED   Candida glabrata NOT DETECTED NOT DETECTED   Candida krusei NOT DETECTED NOT DETECTED   Candida parapsilosis NOT DETECTED NOT DETECTED   Candida tropicalis NOT DETECTED NOT DETECTED  I-Stat CG4 Lactic Acid, ED  (not at  [facility])     Status: Abnormal   Collection Time: 06/29/17 11:56 AM  Result Value Ref Range   Lactic Acid, Venous 4.07 (HH) 0.5 - 1.9 mmol/L  Urinalysis, Routine w reflex microscopic     Status: Abnormal   Collection Time: 06/29/17 12:58 PM  Result Value Ref Range   Color, Urine AMBER (A) YELLOW    Comment: BIOCHEMICALS MAY BE AFFECTED BY COLOR   APPearance HAZY (A) CLEAR   Specific Gravity, Urine 1.016 1.005 - 1.030   pH 5.0 5.0 - 8.0   Glucose, UA NEGATIVE NEGATIVE mg/dL   Hgb urine dipstick NEGATIVE NEGATIVE   Bilirubin Urine NEGATIVE NEGATIVE   Ketones, ur NEGATIVE NEGATIVE mg/dL   Protein, ur 30 (A) NEGATIVE mg/dL   Nitrite NEGATIVE NEGATIVE   Leukocytes, UA NEGATIVE NEGATIVE   RBC / HPF 0-5 0 - 5 RBC/hpf   WBC, UA 0-5 0 - 5 WBC/hpf   Bacteria, UA FEW (A) NONE SEEN   Squamous Epithelial / LPF NONE SEEN NONE SEEN   Mucus PRESENT    Hyaline  Casts, UA PRESENT    Granular Casts, UA PRESENT    Amorphous Crystal PRESENT   Type and screen [place]     Status: None   Collection Time: 06/29/17  3:20 PM  Result Value Ref Range   ABO/RH(D) O POS    Antibody Screen NEG    Sample Expiration 07/02/2017   ABO/Rh     Status: None   Collection Time: 06/29/17  3:20 PM  Result Value Ref Range   ABO/RH(D) O POS   Aerobic/Anaerobic Culture (surgical/deep wound)     Status: None (Preliminary result)   Collection Time: 06/29/17  4:05 PM  Result Value Ref Range   Specimen Description ABSCESS ABDOMEN    Special Requests NONE    Gram Stain      ABUNDANT WBC PRESENT,BOTH PMN AND MONONUCLEAR ABUNDANT GRAM POSITIVE COCCI IN PAIRS MODERATE GRAM NEGATIVE RODS    Culture PENDING    Report Status PENDING   Lactic acid, plasma     Status: None   Collection Time: 06/29/17  4:32 PM  Result Value Ref Range   Lactic Acid, Venous 1.7 0.5 - 1.9 mmol/L  Aerobic/Anaerobic Culture (surgical/deep wound)     Status: None (Preliminary result)   Collection Time: 06/29/17  4:35 PM  Result Value Ref Range   Specimen Description TISSUE    Special Requests PELVIC ABCESS CAVITY    Gram Stain      ABUNDANT WBC PRESENT,BOTH PMN AND MONONUCLEAR MODERATE GRAM POSITIVE COCCI IN PAIRS RARE GRAM NEGATIVE RODS    Culture PENDING    Report Status PENDING  MRSA PCR Screening     Status: None   Collection Time: 06/29/17 11:33 PM  Result Value Ref Range   MRSA by PCR NEGATIVE NEGATIVE    Comment:        The GeneXpert MRSA Assay (FDA approved for NASAL specimens only), is one component of a comprehensive MRSA colonization surveillance program. It is not intended to diagnose MRSA infection nor to guide or monitor treatment for MRSA infections.   Basic metabolic panel     Status: Abnormal   Collection Time: 06/30/17  5:43 AM  Result Value Ref Range   Sodium 140 135 - 145 mmol/L   Potassium 4.4 3.5 - 5.1 mmol/L   Chloride 105 101 - 111  mmol/L   CO2 26 22 - 32 mmol/L   Glucose, Bld 117 (H) 65 - 99 mg/dL   BUN 21 (H) 6 - 20 mg/dL   Creatinine, Ser 1.77 (H) 0.44 - 1.00 mg/dL   Calcium 7.6 (L) 8.9 - 10.3 mg/dL   GFR calc non Af Amer 29 (L) >60 mL/min   GFR calc Af Amer 34 (L) >60 mL/min    Comment: (NOTE) The eGFR has been calculated using the CKD EPI equation. This calculation has not been validated in all clinical situations. eGFR's persistently <60 mL/min signify possible Chronic Kidney Disease.    Anion gap 9 5 - 15  CBC     Status: Abnormal   Collection Time: 06/30/17  5:43 AM  Result Value Ref Range   WBC 48.4 (H) 4.0 - 10.5 K/uL    Comment: REPEATED TO VERIFY   RBC 2.98 (L) 3.87 - 5.11 MIL/uL   Hemoglobin 7.5 (L) 12.0 - 15.0 g/dL    Comment: REPEATED TO VERIFY   HCT 24.4 (L) 36.0 - 46.0 %   MCV 81.9 78.0 - 100.0 fL   MCH 25.2 (L) 26.0 - 34.0 pg   MCHC 30.7 30.0 - 36.0 g/dL   RDW 16.7 (H) 11.5 - 15.5 %   Platelets 353 150 - 400 K/uL    Comment: REPEATED TO VERIFY  Magnesium     Status: None   Collection Time: 06/30/17  5:43 AM  Result Value Ref Range   Magnesium 1.8 1.7 - 2.4 mg/dL  Phosphorus     Status: None   Collection Time: 06/30/17  5:43 AM  Result Value Ref Range   Phosphorus 4.5 2.5 - 4.6 mg/dL    MICRO: 10/15 gpc in chains in blood cx 10/15 GNR in blood cx IMAGING: Ct Abdomen Pelvis Wo Contrast  Result Date: 06/29/2017 CLINICAL DATA:  Lower abdominal pain. EXAM: CT ABDOMEN AND PELVIS WITHOUT CONTRAST TECHNIQUE: Multidetector CT imaging of the abdomen and pelvis was performed following the standard protocol without IV contrast. COMPARISON:  None. FINDINGS: Lower chest: Mild bibasilar subsegmental atelectasis is noted. Hepatobiliary: No focal liver abnormality is seen. No gallstones, gallbladder wall thickening, or biliary dilatation. Pancreas: Unremarkable. No pancreatic ductal dilatation or surrounding inflammatory changes. Spleen: Normal in size without focal abnormality. Adrenals/Urinary  Tract: Adrenal glands and kidneys are unremarkable. No hydronephrosis or renal obstruction is noted. No renal or ureteral calculi are noted. Urinary bladder is decompressed. Stomach/Bowel: The stomach appears normal. The appendix is not clearly visualized. There is no evidence bowel obstruction. Sigmoid diverticulitis is noted. Large paradiverticular abscess containing stool is noted which measures 8.9 x 6.3 cm. Vascular/Lymphatic: Aortic atherosclerosis. No enlarged abdominal or pelvic lymph nodes. Reproductive: Status post hysterectomy. No adnexal masses. Other: Probable 3.7 x 3.7 cm fluid collection  is noted in the pre rectal space of the pelvis concerning for possible abscess. Musculoskeletal: No acute or significant osseous findings. IMPRESSION: Sigmoid diverticulitis is noted with 8.9 x 6.3 cm paradiverticular abscess which contains stool. 3.7 x 3.7 fluid collection is also noted in the prerectal space of the pelvis concerning for possible abscess. Aortic atherosclerosis. Electronically Signed   By: Marijo Conception, M.D.   On: 06/29/2017 09:52   Dg Chest Port 1 View  Result Date: 06/29/2017 CLINICAL DATA:  Central line placement EXAM: PORTABLE CHEST 1 VIEW COMPARISON:  06/29/2017 FINDINGS: A right central venous catheter has been placed. Tip is at the level of the low SVC but the catheter is projected slightly lateral to the mediastinal shadow. This may just be due to patient rotation but can't exclude extravascular location. Correlate for catheter function with blood return evaluation. No pneumothorax. Cardiac enlargement without vascular congestion. Atelectasis in the lung bases. No blunting of costophrenic angles. Calcified aorta. Gaseous distention of the stomach. IMPRESSION: Right central venous catheter tip at the level of the low SVC region. The catheter projects slightly lateral to the mediastinal shadow, possibly due to patient rotation but correlation with catheter function and blood return is  recommended to exclude extravascular location. No pneumothorax. Cardiac enlargement with atelectasis in the lung bases. Electronically Signed   By: Lucienne Capers M.D.   On: 06/29/2017 21:54   Dg Chest Port 1 View  Result Date: 06/29/2017 CLINICAL DATA:  Sepsis. EXAM: PORTABLE CHEST 1 VIEW COMPARISON:  Chest x-ray dated Jan 31, 2016. FINDINGS: Stable cardiomegaly. Normal pulmonary vascularity. Atherosclerotic calcification of the aortic arch. Mild elevation of the right hemidiaphragm with adjacent right basilar atelectasis. No focal consolidation, pleural effusion, or pneumothorax. No acute osseous abnormality. IMPRESSION: Stable cardiomegaly.  No active disease. Electronically Signed   By: Titus Dubin M.D.   On: 06/29/2017 09:30    Assessment/Plan:  66yo F with large diverticular abscess nad necrotic bowel infection with secondary bacteremia with gram positive and gram negatives as well as fungemia  Candidemia = continue on fluconazole 446m iv daily. She has history of left eye floaters and has upcoming ophtho exam on 10/24. Currently denies issues with any blurry vision. Recommend TTE to look for signs of endocarditis  Strep and ecoli bacteremia = likely due to intra abdominal infection. Continue on piptazo  Hx of cefazolin allergy = continue on piptazo and see if she develops rash.

## 2017-06-30 NOTE — Anesthesia Postprocedure Evaluation (Signed)
Anesthesia Post Note  Patient: Lindsay [name]  Procedure(s) Performed: EXPLORATORY LAPAROTOMY (N/A Abdomen) COLOSTOMY (N/A Abdomen)     Patient location during evaluation: PACU Anesthesia Type: General Level of consciousness: sedated and patient cooperative Pain management: pain level controlled Vital Signs Assessment: post-procedure vital signs reviewed and stable Respiratory status: spontaneous breathing Cardiovascular status: stable Anesthetic complications: no    Last Vitals:  Vitals:   06/29/17 2300 06/30/17 0000  BP: 113/67 101/62  Pulse: 87 78  Resp: (!) 21 18  Temp: (!) 36.2 C   SpO2: 95% 97%    Last Pain:  Vitals:   06/29/17 2300  TempSrc: Axillary  PainSc:                  Nolon Nations

## 2017-06-30 NOTE — Plan of Care (Signed)
Problem: Education: Goal: Knowledge of [facility] Education information/materials will improve Outcome: Progressing Discussed plan of care for the evening and pain management with PCA pump with teach back displayed.

## 2017-07-01 ENCOUNTER — Inpatient Hospital Stay (HOSPITAL_COMMUNITY): Payer: BLUE CROSS/BLUE SHIELD

## 2017-07-01 LAB — COMPREHENSIVE METABOLIC PANEL
ALT: 26 U/L (ref 14–54)
AST: 20 U/L (ref 15–41)
Albumin: 2.1 g/dL — ABNORMAL LOW (ref 3.5–5.0)
Alkaline Phosphatase: 92 U/L (ref 38–126)
Anion gap: 8 (ref 5–15)
BUN: 30 mg/dL — ABNORMAL HIGH (ref 6–20)
CO2: 23 mmol/L (ref 22–32)
Calcium: 7.2 mg/dL — ABNORMAL LOW (ref 8.9–10.3)
Chloride: 107 mmol/L (ref 101–111)
Creatinine, Ser: 1.84 mg/dL — ABNORMAL HIGH (ref 0.44–1.00)
GFR calc Af Amer: 32 mL/min — ABNORMAL LOW (ref >60)
GFR calc non Af Amer: 28 mL/min — ABNORMAL LOW (ref >60)
Glucose, Bld: 96 mg/dL (ref 65–99)
Potassium: 4.3 mmol/L (ref 3.5–5.1)
Sodium: 138 mmol/L (ref 135–145)
Total Bilirubin: 1.1 mg/dL (ref 0.3–1.2)
Total Protein: 5.4 g/dL — ABNORMAL LOW (ref 6.5–8.1)

## 2017-07-01 LAB — CBC WITH DIFFERENTIAL/PLATELET
Basophils Absolute: 0 10*3/uL (ref 0.0–0.1)
Basophils Relative: 0 %
Eosinophils Absolute: 0 10*3/uL (ref 0.0–0.7)
Eosinophils Relative: 0 %
HCT: 23.7 % — ABNORMAL LOW (ref 36.0–46.0)
Hemoglobin: 7.2 g/dL — ABNORMAL LOW (ref 12.0–15.0)
Lymphocytes Relative: 2 %
Lymphs Abs: 0.9 10*3/uL (ref 0.7–4.0)
MCH: 24.8 pg — ABNORMAL LOW (ref 26.0–34.0)
MCHC: 30.4 g/dL (ref 30.0–36.0)
MCV: 81.7 fL (ref 78.0–100.0)
Monocytes Absolute: 0.9 10*3/uL (ref 0.1–1.0)
Monocytes Relative: 2 %
Neutro Abs: 42.5 10*3/uL — ABNORMAL HIGH (ref 1.7–7.7)
Neutrophils Relative %: 96 %
Platelets: 274 10*3/uL (ref 150–400)
RBC: 2.9 MIL/uL — ABNORMAL LOW (ref 3.87–5.11)
RDW: 16.6 % — ABNORMAL HIGH (ref 11.5–15.5)
WBC: 44.3 10*3/uL — ABNORMAL HIGH (ref 4.0–10.5)

## 2017-07-01 LAB — POCT I-STAT 7, (LYTES, BLD GAS, ICA,H+H)
Acid-base deficit: 3 mmol/L — ABNORMAL HIGH (ref 0.0–2.0)
Bicarbonate: 21.5 mmol/L (ref 20.0–28.0)
Calcium, Ion: 0.91 mmol/L — ABNORMAL LOW (ref 1.15–1.40)
HCT: 23 % — ABNORMAL LOW (ref 36.0–46.0)
Hemoglobin: 7.8 g/dL — ABNORMAL LOW (ref 12.0–15.0)
O2 Saturation: 99 %
Potassium: 4 mmol/L (ref 3.5–5.1)
Sodium: 141 mmol/L (ref 135–145)
TCO2: 23 mmol/L (ref 22–32)
pCO2 arterial: 32.9 mmHg (ref 32.0–48.0)
pH, Arterial: 7.424 (ref 7.350–7.450)
pO2, Arterial: 150 mmHg — ABNORMAL HIGH (ref 83.0–108.0)

## 2017-07-01 LAB — HIV ANTIBODY (ROUTINE TESTING W REFLEX): HIV Screen 4th Generation wRfx: NONREACTIVE

## 2017-07-01 LAB — MAGNESIUM: Magnesium: 1.9 mg/dL (ref 1.7–2.4)

## 2017-07-01 LAB — PHOSPHORUS: Phosphorus: 3.8 mg/dL (ref 2.5–4.6)

## 2017-07-01 MED ORDER — LACTATED RINGERS IV BOLUS (SEPSIS)
500.0000 mL | Freq: Once | INTRAVENOUS | Status: AC
  Administered 2017-07-01: 500 mL via INTRAVENOUS

## 2017-07-01 MED ORDER — CHLORHEXIDINE GLUCONATE CLOTH 2 % EX PADS
6.0000 | MEDICATED_PAD | Freq: Every day | CUTANEOUS | Status: DC
  Administered 2017-07-03 – 2017-07-16 (×11): 6 via TOPICAL

## 2017-07-01 MED ORDER — SODIUM CHLORIDE 0.9% FLUSH
10.0000 mL | INTRAVENOUS | Status: DC | PRN
  Administered 2017-07-06 – 2017-07-13 (×2): 10 mL
  Filled 2017-07-01 (×2): qty 40

## 2017-07-01 NOTE — Progress Notes (Signed)
  Progress Note   Date: 07/01/2017  Patient Name: Lindsay [name]        MRN#: [id number]  Review of the patient's clinical findings supports the diagnosis of  Acute blood loss anemia    Ileana Roup, MD

## 2017-07-01 NOTE — Progress Notes (Signed)
Patient still having minimal output per hour from her Foley for intake of fluids will have first shift ask about diuresing to main MD.  As well the orders for dressing change say not to touch for 24 hours and the surgeon usually removes the first dressing and places specific dressing orders afterwards.  Will have first shift RN follow-up.

## 2017-07-01 NOTE — Progress Notes (Signed)
PT Cancellation Note  Patient Details Name: Marijke Guadiana MRN: [id number] DOB: 08/25/1951   Cancelled Treatment:    Reason Eval/Treat Not Completed: Fatigue/lethargy limiting ability to participate. Pt reports just having her dressing changed and feeling tired. She is politely declining participation in PT at this time. PT to re-attempt as time allows.    Lorriane Shire 07/01/2017, 10:44 AM

## 2017-07-01 NOTE — Progress Notes (Addendum)
Lab unable to get blood cultures peripherally. Requesting I contact MD re: do we want to draw from central line? Messaged Dr. Dema Severin  Instructed by Dr. Dema Severin to contact ID regarding this. Messaged on call ID MD

## 2017-07-01 NOTE — Consult Note (Addendum)
[place] Nurse ostomy consult note Surgical team following for assessment and plan of care to abd wound.  Stoma type/location: Colostomy stoma from surgery on 10/15. Stomal assessment/size: Stoma red and viable, flush with skin level, 1 inch Peristomal assessment: intact skin surrounding Output: No stool or flatus  Ostomy pouching: 1pc.  Education provided:  Demonstrated pouch change and patient watched the procedure but did not look at the stoma.  Applied barrier ring and one piece pouch. Pt watched opening and closure of velcro to empty.  Supplies ordered to room for bedside nurse use.  WOC will continue to follow when out of ICU for further teaching sessions. Enrolled patient in [employer] program: No Julien Girt MSN, [place], [place], [place], [place]

## 2017-07-01 NOTE — Progress Notes (Signed)
[facility]                   Date of Admission:  06/29/2017                                 Total days of antibiotics 3                                                                                     Day 2 Zosyn                                                                                     Day 3 Fluconazole                                                                                     Cipro/Flagyl 10/15-10/16                                                              Reason for Consult: Bacteremia / Fungemia                           Assessment:  Intraabdominal Abscesses with secondary polymicrobial (strep and ecoli) Bacteremia, Fungemia  Patient presented with LLQ pain fevers, and chills. She was febrile with leukocytosis and CT showin Intraabdominal abscesses. Abscess drained by surgery (10/15) and were found to have eroded into both the colon and left fallopian tube. 24hr Tmax 97.6. WBC Trending down 48.6 -> 47.6 -> 44.3. - Abdominal Abscess Cultures (10/15) Gram (+) Cocci in pairs, Gram (-) Rods - Blood Cultures (10/15): Gram (+) Cocci, Gram (-) rods - BCID 1 (10/15): Step Species, Candida Tropicalis - BCID 2: (10/15): E. Coli  - MRSA Nasal PCR: Negative  Plan:  - Continue piptazo to cover strep and ecoli. - we will still continue with Fluconazole though c.tropicalis has not grown yet in culture (can likely still grow over the next 3 days of culture)  - Repeat Blood Cultures, if positive recommend TTE to evaluate for fungal endocarditis  - When bacteremia has cleared, exchanged central catheter  Active Problems:   Diverticulitis of colon with perforation   Diverticulitis  Scheduled Meds: . HYDROmorphone   Intravenous Q4H  . pantoprazole (PROTONIX) IV  40 mg Intravenous QHS   Continuous Infusions: . 0.45 % NaCl with KCl 20 mEq / L 125 mL/hr at 07/01/17 0517  . sodium chloride    . fluconazole (DIFLUCAN) IV    .  lactated ringers 10 mL/hr at 06/30/17 0521  . piperacillin-tazobactam (ZOSYN)  IV 3.375 g (07/01/17 0517)    SUBJECTIVE: Mild tenderness still to abdomen. But afebrile  Review of Systems: Review of Systems  Constitutional: Negative.   HENT: Negative.   Respiratory: Negative.   Cardiovascular: Negative.   Gastrointestinal:       Abdominal discomfort  Skin: Negative.     Allergies  Allergen Reactions  . Ancef [Cefazolin] Rash  . Primaxin [Imipenem] Swelling    Developed lip swelling post administration of Primaxin even though she had tolerated this in the past.  . Heparin     (HIT antibody positive; SRA negative  . Vancomycin Rash    OBJECTIVE: Vitals:   07/01/17 0322 07/01/17 0400 07/01/17 0500 07/01/17 0600  BP:  113/68 110/67 116/64  Pulse:  76 73 71  Resp: 18 (!) 21 18 18   Temp:      TempSrc:      SpO2: 100% 100% 100% 100%  Weight:      Height:       Body mass index is 36.54 kg/m.  Physical Exam  Constitutional: She is oriented to person, place, and time and well-developed, well-nourished, and in no distress.  HENT:  Head: Normocephalic and atraumatic.  Eyes: Conjunctivae and EOM are normal.  Neck:  R IJ double lumen catheter in place  Cardiovascular: Normal rate, regular rhythm and normal heart sounds.   Pulmonary/Chest: Effort normal and breath sounds normal. No respiratory distress.  Abdominal: Soft. She exhibits no distension.  Clean and dry surgical dressing JP drain with serosanguinous fluid   Musculoskeletal: She exhibits no edema.  Neurological: She is alert and oriented to person, place, and time.  Skin: Skin is warm and dry.  Scaring from healed ulcers bilateral lower exrtremities     Lab Results Lab Results  Component Value Date   WBC 44.3 (H) 07/01/2017   HGB 7.2 (L) 07/01/2017   HCT 23.7 (L) 07/01/2017   MCV 81.7 07/01/2017   PLT 274 07/01/2017    Lab Results  Component Value Date   CREATININE 1.84 (H) 07/01/2017   BUN 30 (H)  07/01/2017   NA 138 07/01/2017   K 4.3 07/01/2017   CL 107 07/01/2017   CO2 23 07/01/2017    Lab Results  Component Value Date   ALT 26 07/01/2017   AST 20 07/01/2017   ALKPHOS 92 07/01/2017   BILITOT 1.1 07/01/2017     Microbiology: Recent Results (from the past 240 hour(s))  Blood Culture (routine x 2)     Status: None (Preliminary result)   Collection Time: 06/29/17  8:30 AM  Result Value Ref Range Status   Specimen Description BLOOD LEFT HAND  Final   Special Requests IN PEDIATRIC BOTTLE Blood Culture adequate volume  Final   Culture  Setup Time   Final    GRAM POSITIVE COCCI IN CHAINS IN PEDIATRIC BOTTLE Organism ID to follow CRITICAL RESULT CALLED TO, READ BACK BY AND VERIFIED WITH: M. Bufford Spikes.Ph. 9:45 06/30/17 ([email or web address])    Culture GRAM POSITIVE COCCI IN CHAINS  Final   Report Status PENDING  Incomplete  Blood Culture ID Panel (Reflexed)  Status: Abnormal   Collection Time: 06/29/17  8:30 AM  Result Value Ref Range Status   Enterococcus species NOT DETECTED NOT DETECTED Final   Listeria monocytogenes NOT DETECTED NOT DETECTED Final   Staphylococcus species NOT DETECTED NOT DETECTED Final   Staphylococcus aureus NOT DETECTED NOT DETECTED Final   Streptococcus species DETECTED (A) NOT DETECTED Final    Comment: Not Enterococcus species, Streptococcus agalactiae, Streptococcus pyogenes, or Streptococcus pneumoniae. CRITICAL RESULT CALLED TO, READ BACK BY AND VERIFIED WITH: M. Bufford Spikes.Ph. 9:45 06/30/17 ([email or web address])    Streptococcus agalactiae NOT DETECTED NOT DETECTED Final   Streptococcus pneumoniae NOT DETECTED NOT DETECTED Final   Streptococcus pyogenes NOT DETECTED NOT DETECTED Final   Acinetobacter baumannii NOT DETECTED NOT DETECTED Final   Enterobacteriaceae species NOT DETECTED NOT DETECTED Final   Enterobacter cloacae complex NOT DETECTED NOT DETECTED Final   Escherichia coli NOT DETECTED NOT DETECTED Final   Klebsiella oxytoca NOT DETECTED NOT DETECTED Final    Klebsiella pneumoniae NOT DETECTED NOT DETECTED Final   Proteus species NOT DETECTED NOT DETECTED Final   Serratia marcescens NOT DETECTED NOT DETECTED Final   Haemophilus influenzae NOT DETECTED NOT DETECTED Final   Neisseria meningitidis NOT DETECTED NOT DETECTED Final   Pseudomonas aeruginosa NOT DETECTED NOT DETECTED Final   Candida albicans NOT DETECTED NOT DETECTED Final   Candida glabrata NOT DETECTED NOT DETECTED Final   Candida krusei NOT DETECTED NOT DETECTED Final   Candida parapsilosis NOT DETECTED NOT DETECTED Final   Candida tropicalis DETECTED (A) NOT DETECTED Final    Comment: CRITICAL RESULT CALLED TO, READ BACK BY AND VERIFIED WITH: M. Bufford Spikes.Ph. 9:45 06/30/17 ([email or web address])   Blood Culture (routine x 2)     Status: None (Preliminary result)   Collection Time: 06/29/17  9:15 AM  Result Value Ref Range Status   Specimen Description BLOOD  Final   Special Requests IN PEDIATRIC BOTTLE Blood Culture adequate volume  Final   Culture  Setup Time   Final    GRAM NEGATIVE RODS IN PEDIATRIC BOTTLE CRITICAL RESULT CALLED TO, READ BACK BY AND VERIFIED WITH: M. LI AT 1030 ON 06/30/17 BY C. JESSUP, MLT.    Culture GRAM NEGATIVE RODS  Final   Report Status PENDING  Incomplete  Blood Culture ID Panel (Reflexed)     Status: Abnormal   Collection Time: 06/29/17  9:15 AM  Result Value Ref Range Status   Enterococcus species NOT DETECTED NOT DETECTED Final   Listeria monocytogenes NOT DETECTED NOT DETECTED Final   Staphylococcus species NOT DETECTED NOT DETECTED Final   Staphylococcus aureus NOT DETECTED NOT DETECTED Final   Streptococcus species NOT DETECTED NOT DETECTED Final   Streptococcus agalactiae NOT DETECTED NOT DETECTED Final   Streptococcus pneumoniae NOT DETECTED NOT DETECTED Final   Streptococcus pyogenes NOT DETECTED NOT DETECTED Final   Acinetobacter baumannii NOT DETECTED NOT DETECTED Final   Enterobacteriaceae species DETECTED (A) NOT DETECTED Final    Comment:  Enterobacteriaceae represent a large family of gram-negative bacteria, not a single organism. CRITICAL RESULT CALLED TO, READ BACK BY AND VERIFIED WITH: M. LI, PHARMD AT 1030 ON 06/30/17 BY C. JESSUP, MLT.    Enterobacter cloacae complex NOT DETECTED NOT DETECTED Final   Escherichia coli DETECTED (A) NOT DETECTED Final    Comment: CRITICAL RESULT CALLED TO, READ BACK BY AND VERIFIED WITH: M. LI, PHARMD AT 1030 ON 06/30/17 BY C. JESSUP, MLT.    Klebsiella oxytoca NOT DETECTED NOT DETECTED Final   Klebsiella  pneumoniae NOT DETECTED NOT DETECTED Final   Proteus species NOT DETECTED NOT DETECTED Final   Serratia marcescens NOT DETECTED NOT DETECTED Final   Carbapenem resistance NOT DETECTED NOT DETECTED Final   Haemophilus influenzae NOT DETECTED NOT DETECTED Final   Neisseria meningitidis NOT DETECTED NOT DETECTED Final   Pseudomonas aeruginosa NOT DETECTED NOT DETECTED Final   Candida albicans NOT DETECTED NOT DETECTED Final   Candida glabrata NOT DETECTED NOT DETECTED Final   Candida krusei NOT DETECTED NOT DETECTED Final   Candida parapsilosis NOT DETECTED NOT DETECTED Final   Candida tropicalis NOT DETECTED NOT DETECTED Final  Aerobic/Anaerobic Culture (surgical/deep wound)     Status: None (Preliminary result)   Collection Time: 06/29/17  4:05 PM  Result Value Ref Range Status   Specimen Description ABSCESS ABDOMEN  Final   Special Requests NONE  Final   Gram Stain   Final    ABUNDANT WBC PRESENT,BOTH PMN AND MONONUCLEAR ABUNDANT GRAM POSITIVE COCCI IN PAIRS MODERATE GRAM NEGATIVE RODS    Culture CULTURE REINCUBATED FOR BETTER GROWTH  Final   Report Status PENDING  Incomplete  Aerobic/Anaerobic Culture (surgical/deep wound)     Status: None (Preliminary result)   Collection Time: 06/29/17  4:35 PM  Result Value Ref Range Status   Specimen Description TISSUE  Final   Special Requests PELVIC ABCESS CAVITY  Final   Gram Stain   Final    ABUNDANT WBC PRESENT,BOTH PMN AND  MONONUCLEAR MODERATE GRAM POSITIVE COCCI IN PAIRS RARE GRAM NEGATIVE RODS    Culture CULTURE REINCUBATED FOR BETTER GROWTH  Final   Report Status PENDING  Incomplete  MRSA PCR Screening     Status: None   Collection Time: 06/29/17 11:33 PM  Result Value Ref Range Status   MRSA by PCR NEGATIVE NEGATIVE Final    Comment:        The GeneXpert MRSA Assay (FDA approved for NASAL specimens only), is one component of a comprehensive MRSA colonization surveillance program. It is not intended to diagnose MRSA infection nor to guide or monitor treatment for MRSA infections.    Pearson Grippe, DO IM PGY-1 --------------------------------------------  Agree with the assessment and plan as described above by dr Trilby Drummer.  Elzie Rings [place] [facility] [phone]

## 2017-07-01 NOTE — Progress Notes (Signed)
[facility] Infusion Coordinator will follow case with ID team to support home infusion needs as ordered.  If patient discharges after hours, please call [phone].   Lindsay [name] 07/01/2017, 12:02 PM

## 2017-07-01 NOTE — Progress Notes (Signed)
Subjective No acute events. Doing well - feeling better today. Denies n/v. Now having flatus from stoma. Pain controlled. Getting up to chair and around the room.  Objective: Vital signs in last 24 hours: Temp:  [96.8 F (36 C)-97.7 F (36.5 C)] 97.7 F (36.5 C) (10/17 0725) Pulse Rate:  [68-86] 80 (10/17 1200) Resp:  [7-28] 22 (10/17 1200) BP: (96-138)/(58-72) 119/65 (10/17 1200) SpO2:  [98 %-100 %] 100 % (10/17 1200) Last BM Date: 06/29/17  Intake/Output from previous day: 10/16 0701 - 10/17 0700 In: 3759.5 [P.O.:360; I.V.:3299.5; IV Piggyback:100] Out: 925 [Urine:650; Drains:275] Intake/Output this shift: Total I/O In: 300.4 [I.V.:300.4] Out: 205 [Urine:150; Drains:55]  Gen: NAD, comfortable CV: RRR Pulm: Normal work of breathing Abd: Soft, nontender, not significantly distended; stoma pink and productive of gas Ext: SCDs in place  Lab Results: CBC   Recent Labs  06/30/17 1554 07/01/17 0310  WBC 47.6* 44.3*  HGB 7.7* 7.2*  HCT 24.3* 23.7*  PLT 298 274   BMET  Recent Labs  06/30/17 0543 07/01/17 0310  NA 140 138  K 4.4 4.3  CL 105 107  CO2 26 23  GLUCOSE 117* 96  BUN 21* 30*  CREATININE 1.77* 1.84*  CALCIUM 7.6* 7.2*   PT/INR No results for input(s): LABPROT, INR in the last 72 hours. ABG  Recent Labs  06/29/17 1630  PHART 7.424  HCO3 21.5    Studies/Results:  Anti-infectives: Anti-infectives    Start     Dose/Rate Route Frequency Ordered Stop   07/01/17 1030  fluconazole (DIFLUCAN) IVPB 400 mg     400 mg 100 mL/hr over 120 Minutes Intravenous Every 24 hours 06/30/17 0957     06/30/17 1830  piperacillin-tazobactam (ZOSYN) IVPB 3.375 g     3.375 g 12.5 mL/hr over 240 Minutes Intravenous Every 8 hours 06/30/17 1106     06/30/17 1115  piperacillin-tazobactam (ZOSYN) IVPB 3.375 g     3.375 g 100 mL/hr over 30 Minutes Intravenous  Once 06/30/17 1046 06/30/17 1412   06/30/17 1030  fluconazole (DIFLUCAN) IVPB 800 mg     800 mg 200 mL/hr  over 120 Minutes Intravenous  Once 06/30/17 0957 06/30/17 1537   06/29/17 2130  ciprofloxacin (CIPRO) IVPB 400 mg  Status:  Discontinued     400 mg 200 mL/hr over 60 Minutes Intravenous Every 12 hours 06/29/17 1106 06/30/17 1046   06/29/17 1730  metroNIDAZOLE (FLAGYL) IVPB 500 mg  Status:  Discontinued     500 mg 100 mL/hr over 60 Minutes Intravenous Every 8 hours 06/29/17 1106 06/30/17 1046   06/29/17 1000  metroNIDAZOLE (FLAGYL) IVPB 500 mg  Status:  Discontinued     500 mg 100 mL/hr over 60 Minutes Intravenous Every 8 hours 06/29/17 0915 06/29/17 1117   06/29/17 1000  ciprofloxacin (CIPRO) IVPB 400 mg  Status:  Discontinued     400 mg 200 mL/hr over 60 Minutes Intravenous Every 12 hours 06/29/17 0915 06/29/17 1117       Assessment/Plan: Patient Active Problem List   Diagnosis Date Noted  . Diverticulitis of colon with perforation 06/29/2017  . Diverticulitis 06/29/2017  . Erythema nodosum 01/31/2016  . Hypothyroidism 01/31/2016  . Adenopathy   . Wound infection   . Cellulitis of both lower extremities 01/28/2016  . Lymphedema of both lower extremities 01/28/2016  . Increased anion gap metabolic acidosis 76/28/3151  . Allergic angioedema 06/02/2014  . Ulcer of left lower leg ([place]) 06/01/2014  . History of HIT (heparin-induced thrombocytopenia)  09/24/2013  .  Thrombocytopenia, unspecified ([place]) 09/21/2013  . Anemia 09/21/2013  . Drug induced neutropenia(288.03) 09/21/2013  . HTN (hypertension) 08/21/2013  . OSA (obstructive sleep apnea) 12/25/2008   59F with hx of HTN and ?proliferative diabetic retinopathy - POD#2 s/p exlap, sigmoid colectomy, left salpingectomy, repair of 41mm cystotomy for acute gangrenous sigmoid diverticulitis  -WBC 44, downtrending - afebrile, normotensive, normal HR, reassurring abdominal exam, adequate UOP. Will continue to trend. Has a source - blood cultures with polymicrobial growth + candida. On IV abx + antifungal per ID - day #2. TTE pending.  Appreciate assistance -Continue foley for 2 weeks until CT cysto complete -Cr 1.84 from 1.77 - giving 500cc bolus; renal US noted - medical disease, no hydronephrosis. -JP to bulb -Hgb stable  -Advance to clear liquids -Ambulate and out of bed to chair today - PT/OT -Continue wet to dry dressing to midline wound - will plan for wound vac in coming days  -WOCN seeing for stoma appliance -PPx: SCDs; PPI -Dispo: Remain in stepdown today  LOS: 2 days   Sharon Mt. Dema Severin, M.D. General and Colorectal Surgery [facility]

## 2017-07-02 ENCOUNTER — Other Ambulatory Visit (HOSPITAL_COMMUNITY): Payer: BLUE CROSS/BLUE SHIELD

## 2017-07-02 LAB — CBC WITH DIFFERENTIAL/PLATELET
Basophils Absolute: 0 10*3/uL (ref 0.0–0.1)
Basophils Relative: 0 %
Eosinophils Absolute: 0 10*3/uL (ref 0.0–0.7)
Eosinophils Relative: 0 %
HCT: 24.4 % — ABNORMAL LOW (ref 36.0–46.0)
Hemoglobin: 7.5 g/dL — ABNORMAL LOW (ref 12.0–15.0)
Lymphocytes Relative: 4 %
Lymphs Abs: 1.2 10*3/uL (ref 0.7–4.0)
MCH: 25.1 pg — ABNORMAL LOW (ref 26.0–34.0)
MCHC: 30.7 g/dL (ref 30.0–36.0)
MCV: 81.6 fL (ref 78.0–100.0)
Monocytes Absolute: 0.6 10*3/uL (ref 0.1–1.0)
Monocytes Relative: 2 %
Neutro Abs: 27.1 10*3/uL — ABNORMAL HIGH (ref 1.7–7.7)
Neutrophils Relative %: 94 %
Platelets: 306 10*3/uL (ref 150–400)
RBC: 2.99 MIL/uL — ABNORMAL LOW (ref 3.87–5.11)
RDW: 17.4 % — ABNORMAL HIGH (ref 11.5–15.5)
WBC: 28.9 10*3/uL — ABNORMAL HIGH (ref 4.0–10.5)

## 2017-07-02 LAB — BASIC METABOLIC PANEL
Anion gap: 7 (ref 5–15)
BUN: 33 mg/dL — ABNORMAL HIGH (ref 6–20)
CO2: 22 mmol/L (ref 22–32)
Calcium: 7.7 mg/dL — ABNORMAL LOW (ref 8.9–10.3)
Chloride: 108 mmol/L (ref 101–111)
Creatinine, Ser: 1.7 mg/dL — ABNORMAL HIGH (ref 0.44–1.00)
GFR calc Af Amer: 36 mL/min — ABNORMAL LOW (ref >60)
GFR calc non Af Amer: 31 mL/min — ABNORMAL LOW (ref >60)
Glucose, Bld: 74 mg/dL (ref 65–99)
Potassium: 4.8 mmol/L (ref 3.5–5.1)
Sodium: 137 mmol/L (ref 135–145)

## 2017-07-02 LAB — URINALYSIS, ROUTINE W REFLEX MICROSCOPIC
Bilirubin Urine: NEGATIVE
Glucose, UA: NEGATIVE mg/dL
Ketones, ur: 5 mg/dL — AB
Leukocytes, UA: NEGATIVE
Nitrite: NEGATIVE
Protein, ur: NEGATIVE mg/dL
Specific Gravity, Urine: 1.015 (ref 1.005–1.030)
Squamous Epithelial / LPF: NONE SEEN
pH: 5 (ref 5.0–8.0)

## 2017-07-02 LAB — CULTURE, BLOOD (ROUTINE X 2)
Special Requests: ADEQUATE
Special Requests: ADEQUATE

## 2017-07-02 LAB — PHOSPHORUS: Phosphorus: 3.4 mg/dL (ref 2.5–4.6)

## 2017-07-02 LAB — MAGNESIUM: Magnesium: 2 mg/dL (ref 1.7–2.4)

## 2017-07-02 MED ORDER — SODIUM CHLORIDE 0.9 % IV BOLUS (SEPSIS)
500.0000 mL | Freq: Once | INTRAVENOUS | Status: AC
  Administered 2017-07-02: 500 mL via INTRAVENOUS

## 2017-07-02 MED ORDER — BETHANECHOL CHLORIDE 10 MG PO TABS
10.0000 mg | ORAL_TABLET | Freq: Three times a day (TID) | ORAL | Status: DC
  Administered 2017-07-02: 10 mg via ORAL
  Filled 2017-07-02: qty 1

## 2017-07-02 MED ORDER — HYDROMORPHONE HCL 1 MG/ML IJ SOLN
1.0000 mg | INTRAMUSCULAR | Status: DC | PRN
  Administered 2017-07-02 – 2017-07-13 (×4): 1 mg via INTRAVENOUS
  Filled 2017-07-02 (×4): qty 1

## 2017-07-02 MED ORDER — OXYBUTYNIN CHLORIDE 5 MG PO TABS
5.0000 mg | ORAL_TABLET | Freq: Three times a day (TID) | ORAL | Status: DC
  Administered 2017-07-02 – 2017-07-16 (×41): 5 mg via ORAL
  Filled 2017-07-02 (×43): qty 1

## 2017-07-02 MED ORDER — OXYCODONE HCL 5 MG PO TABS
5.0000 mg | ORAL_TABLET | ORAL | Status: DC | PRN
  Administered 2017-07-02 – 2017-07-03 (×3): 5 mg via ORAL
  Administered 2017-07-04 – 2017-07-08 (×10): 10 mg via ORAL
  Administered 2017-07-09 – 2017-07-12 (×2): 5 mg via ORAL
  Filled 2017-07-02: qty 1
  Filled 2017-07-02: qty 2
  Filled 2017-07-02 (×2): qty 1
  Filled 2017-07-02 (×3): qty 2
  Filled 2017-07-02: qty 1
  Filled 2017-07-02 (×4): qty 2
  Filled 2017-07-02: qty 1
  Filled 2017-07-02 (×2): qty 2

## 2017-07-02 MED ORDER — PHENAZOPYRIDINE HCL 100 MG PO TABS: 100.0000 mg | ORAL_TABLET | Freq: Three times a day (TID) | ORAL | Status: DC

## 2017-07-02 MED ORDER — TRAMADOL HCL 50 MG PO TABS: 50.0000 mg | ORAL_TABLET | Freq: Four times a day (QID) | ORAL | Status: DC | PRN

## 2017-07-02 NOTE — Progress Notes (Signed)
Occupational Therapy Treatment Patient Details Name: Lindsay [name] MRN: [id number] DOB: 19-Jun-1951 Today's Date: 07/02/2017    History of present illness Pt is a 66 y.o. female admitted with perforated diverticulitis. She underwent laparotomy/sigmoid colectomy 06-29-17.   OT comments  Pt very motivated to participate with therapy. Medical/pain issues continue to hinder her progress. During today's session, pt having bladder spasms. She describes it as feeling like she needs to continually urinate. Pt rates as 10/10 and moaning/profusely sweating throughout session (asked RN to maybe check foley as there was a big wet spot on new gown in peri care, did not smell like urine upon inspection). Pt remains unable to reach feet due to pain for LB bathing/dressing. Pt was able to sit EOB for OT to wash back after sweating, and Pt was able to wash underarms and front. OT will continue to follow in the acute setting.    Follow Up Recommendations  Home health OT;Supervision/Assistance - 24 hour    Equipment Recommendations  Tub/shower bench    Recommendations for Other Services      Precautions / Restrictions Precautions Precautions: Fall;Other (comment) Precaution Comments: multi lines/drains Restrictions Weight Bearing Restrictions: No       Mobility Bed Mobility Overal bed mobility: Needs Assistance Bed Mobility: Sit to Sidelying;Rolling Rolling: Supervision     Sit to supine: Min assist Sit to sidelying: Mod assist General bed mobility comments: assist with BLE into bed  Transfers Overall transfer level: Needs assistance Equipment used: Rolling walker (2 wheeled) Transfers: Sit to/from [facility] Sit to Stand: Min assist Stand pivot transfers: Min assist;+2 safety/equipment       General transfer comment: verbal cues for hand placement, assist to power up    Balance Overall balance assessment: Needs assistance Sitting-balance support: No upper extremity  supported;Feet supported Sitting balance-Leahy Scale: Good     Standing balance support: Bilateral upper extremity supported;During functional activity Standing balance-Leahy Scale: Poor Standing balance comment: reliant on UE support                            ADL either performed or assessed with clinical judgement   ADL Overall ADL's : Needs assistance/impaired     Grooming: Wash/dry face;Set up Grooming Details (indicate cue type and reason): in recliner Upper Body Bathing: Minimal assistance;Sitting Upper Body Bathing Details (indicate cue type and reason): sitting EOB assist for back         Lower Body Dressing: Maximal assistance;Sitting/lateral leans Lower Body Dressing Details (indicate cue type and reason): Pt unable to reach down to feet to adjust socks this session Toilet Transfer: Minimal assistance;Ambulation;Comfort height toilet;RW           Functional mobility during ADLs: Minimal assistance;Rolling walker General ADL Comments: limited by abdominal discomfort      Vision       Perception     Praxis      Cognition Arousal/Alertness: Awake/alert Behavior During Therapy: WFL for tasks assessed/performed Overall Cognitive Status: Within Functional Limits for tasks assessed                                          Exercises     Shoulder Instructions       General Comments VSS    Pertinent Vitals/ Pain       Pain Assessment: 0-10 Pain Score: 10-Worst pain  ever Pain Location: bladder spasms Pain Descriptors / Indicators: Grimacing;Spasm;Guarding;Moaning Pain Intervention(s): Monitored during session;Repositioned;RN gave pain meds during session  Home Living                                          Prior Functioning/Environment              Frequency  Min 2X/week        Progress Toward Goals  OT Goals(current goals can now be found in the care plan section)  Progress towards OT  goals: Not progressing toward goals - comment (limited by pain)  Acute Rehab OT Goals Patient Stated Goal: home OT Goal Formulation: With patient/family Time For Goal Achievement: 07/14/17 Potential to Achieve Goals: Good  Plan Discharge plan remains appropriate;Frequency remains appropriate    Co-evaluation    PT/OT/SLP Co-Evaluation/Treatment: Yes (intended as a dove tail, but Pt required extra assist) Reason for Co-Treatment: For patient/therapist safety;To address functional/ADL transfers PT goals addressed during session: Mobility/safety with mobility OT goals addressed during session: ADL's and self-care      AM-PAC PT "6 Clicks" Daily Activity     Outcome Measure   Help from another person eating meals?: None Help from another person taking care of personal grooming?: A Little Help from another person toileting, which includes using toliet, bedpan, or urinal?: A Lot Help from another person bathing (including washing, rinsing, drying)?: A Lot Help from another person to put on and taking off regular upper body clothing?: A Lot Help from another person to put on and taking off regular lower body clothing?: Total 6 Click Score: 14    End of Session Equipment Utilized During Treatment: Rolling walker  OT Visit Diagnosis: Unsteadiness on feet (R26.81);Pain   Activity Tolerance Patient limited by pain   Patient Left in bed;with call bell/phone within reach;with bed alarm set;with SCD's reapplied   Nurse Communication Mobility status        Time: 4481-8563 OT Time Calculation (min): 14 min  Charges: OT General Charges $OT Visit: 1 Visit OT Treatments $Self Care/Home Management : 8-22 mins  Hulda Humphrey OTR/L [place] 07/02/2017, 5:06 PM

## 2017-07-02 NOTE — Progress Notes (Signed)
Patient states she is actually start to feel the pressure to void and urine witnessed coming through her foley catheter at the time.

## 2017-07-02 NOTE — Plan of Care (Signed)
Problem: Nutrition: Goal: Adequate nutrition will be maintained Outcome: Progressing Discussed with patient about her change in diet to clear liquids and what that includes with teach back displayed

## 2017-07-02 NOTE — Progress Notes (Addendum)
[facility]                   Date of Admission:  06/29/2017                                 Total days of antibiotics 4                                                                                     Day 3 Zosyn                                                                                     Day 4 Fluconazole                                                                                     Cipro/Flagyl 10/15-10/16                                                              Reason for Consult: Bacteremia / Fungemia                           Assessment:  Intraabdominal Abscesses with secondary polymicrobial (strep and ecoli) Bacteremia, Fungemia  Patient presented with LLQ pain fevers, and chills. She was febrile with leukocytosis and CT showin Intraabdominal abscesses. Abscess drained by surgery (10/15) and were found to have eroded into both the colon and left fallopian tube. 24hr Tmax 98.6. WBC Trending down 44.3. -> 28.9 - Abdominal Abscess Cultures (10/15) Strep Anginosis, E. Coli - Blood Cultures (10/15): Strep Anginosis, E. Coli - BCID 1 (10/15): Strep Species, Candida Tropicalis - BCID 2: (10/15): E. Coli - MRSA Nasal PCR: Negative - Repeat Blood Cultures (10/18): Pending  Plan:  - Continue pip/tazo to cover strep and ecoli. - Continue Fluconazole. C.tropicalis has not grown yet in culture (May still grow in the next 1-2 days of culture) - Repeat Blood Cultures drawn today. If positive, recommend TTE to evaluate for fungal endocarditis - When bacteremia has cleared, exchange central catheter  -------------------------- I have seen and examined patient with Dr Trilby Drummer. Agree with treatment plan. Please switch her right IJ to PICC  line once we have repeat cultures that show she has cleared her bacteremia.   Active Problems:   Diverticulitis of colon with perforation   Diverticulitis   Scheduled Meds: . Chlorhexidine Gluconate  Cloth  6 each Topical Daily  . HYDROmorphone   Intravenous Q4H  . pantoprazole (PROTONIX) IV  40 mg Intravenous QHS   Continuous Infusions: . 0.45 % NaCl with KCl 20 mEq / L 125 mL/hr at 07/02/17 0055  . sodium chloride    . fluconazole (DIFLUCAN) IV Stopped (07/01/17 1146)  . lactated ringers 10 mL/hr at 06/30/17 0521  . piperacillin-tazobactam (ZOSYN)  IV 3.375 g (07/02/17 0505)    SUBJECTIVE: Patient resting in chair. Feeling improved today, eating clears. Remains afebrile.  Review of Systems: Review of Systems  Constitutional: Negative.   HENT: Negative.   Respiratory: Negative.   Cardiovascular: Negative.   Gastrointestinal:       Abdominal discomfort  Skin: Negative.     Allergies  Allergen Reactions  . Ancef [Cefazolin] Rash  . Primaxin [Imipenem] Swelling    Developed lip swelling post administration of Primaxin even though she had tolerated this in the past.  . Heparin     (HIT antibody positive; SRA negative  . Vancomycin Rash    OBJECTIVE: Vitals:   07/02/17 0349 07/02/17 0400 07/02/17 0500 07/02/17 0600  BP:  130/83 123/68 122/69  Pulse:  81 68 72  Resp: (!) 24 17 19  (!) 21  Temp:      TempSrc:      SpO2: 98% 99% 100% 100%  Weight:      Height:       Body mass index is 36.54 kg/m.  Physical Exam  Constitutional: She is oriented to person, place, and time and well-developed, well-nourished, and in no distress.  HENT:  Head: Normocephalic and atraumatic.  Eyes: Conjunctivae and EOM are normal.  Neck:  R IJ double lumen catheter in place  Cardiovascular: Normal rate, regular rhythm and normal heart sounds.   Pulmonary/Chest: Effort normal and breath sounds normal. No respiratory distress.  Abdominal: Soft. She exhibits no distension.  Clean and dry surgical dressing JP drain with serosanguinous fluid   Musculoskeletal: She exhibits no edema.  Neurological: She is alert and oriented to person, place, and time.  Skin: Skin is warm and dry.    Scaring from healed ulcers bilateral lower exrtremities     Lab Results Lab Results  Component Value Date   WBC 28.9 (H) 07/02/2017   HGB 7.5 (L) 07/02/2017   HCT 24.4 (L) 07/02/2017   MCV 81.6 07/02/2017   PLT 306 07/02/2017    Lab Results  Component Value Date   CREATININE 1.70 (H) 07/02/2017   BUN 33 (H) 07/02/2017   NA 137 07/02/2017   K 4.8 07/02/2017   CL 108 07/02/2017   CO2 22 07/02/2017    Lab Results  Component Value Date   ALT 26 07/01/2017   AST 20 07/01/2017   ALKPHOS 92 07/01/2017   BILITOT 1.1 07/01/2017     Microbiology: Recent Results (from the past 240 hour(s))  Blood Culture (routine x 2)     Status: Abnormal (Preliminary result)   Collection Time: 06/29/17  8:30 AM  Result Value Ref Range Status   Specimen Description BLOOD LEFT HAND  Final   Special Requests IN PEDIATRIC BOTTLE Blood Culture adequate volume  Final   Culture  Setup Time   Final    GRAM POSITIVE COCCI IN CHAINS IN PEDIATRIC BOTTLE CRITICAL  RESULT CALLED TO, READ BACK BY AND VERIFIED WITH: M. Bufford Spikes.Ph. 9:45 06/30/17 ([email or web address])    Culture STREPTOCOCCUS ANGINOSIS (A)  Final   Report Status PENDING  Incomplete  Blood Culture ID Panel (Reflexed)     Status: Abnormal   Collection Time: 06/29/17  8:30 AM  Result Value Ref Range Status   Enterococcus species NOT DETECTED NOT DETECTED Final   Listeria monocytogenes NOT DETECTED NOT DETECTED Final   Staphylococcus species NOT DETECTED NOT DETECTED Final   Staphylococcus aureus NOT DETECTED NOT DETECTED Final   Streptococcus species DETECTED (A) NOT DETECTED Final    Comment: Not Enterococcus species, Streptococcus agalactiae, Streptococcus pyogenes, or Streptococcus pneumoniae. CRITICAL RESULT CALLED TO, READ BACK BY AND VERIFIED WITH: M. Bufford Spikes.Ph. 9:45 06/30/17 ([email or web address])    Streptococcus agalactiae NOT DETECTED NOT DETECTED Final   Streptococcus pneumoniae NOT DETECTED NOT DETECTED Final   Streptococcus pyogenes NOT DETECTED NOT  DETECTED Final   Acinetobacter baumannii NOT DETECTED NOT DETECTED Final   Enterobacteriaceae species NOT DETECTED NOT DETECTED Final   Enterobacter cloacae complex NOT DETECTED NOT DETECTED Final   Escherichia coli NOT DETECTED NOT DETECTED Final   Klebsiella oxytoca NOT DETECTED NOT DETECTED Final   Klebsiella pneumoniae NOT DETECTED NOT DETECTED Final   Proteus species NOT DETECTED NOT DETECTED Final   Serratia marcescens NOT DETECTED NOT DETECTED Final   Haemophilus influenzae NOT DETECTED NOT DETECTED Final   Neisseria meningitidis NOT DETECTED NOT DETECTED Final   Pseudomonas aeruginosa NOT DETECTED NOT DETECTED Final   Candida albicans NOT DETECTED NOT DETECTED Final   Candida glabrata NOT DETECTED NOT DETECTED Final   Candida krusei NOT DETECTED NOT DETECTED Final   Candida parapsilosis NOT DETECTED NOT DETECTED Final   Candida tropicalis DETECTED (A) NOT DETECTED Final    Comment: CRITICAL RESULT CALLED TO, READ BACK BY AND VERIFIED WITH: M. Bufford Spikes.Ph. 9:45 06/30/17 ([email or web address])   Blood Culture (routine x 2)     Status: Abnormal (Preliminary result)   Collection Time: 06/29/17  9:15 AM  Result Value Ref Range Status   Specimen Description BLOOD  Final   Special Requests IN PEDIATRIC BOTTLE Blood Culture adequate volume  Final   Culture  Setup Time   Final    GRAM NEGATIVE RODS IN PEDIATRIC BOTTLE CRITICAL RESULT CALLED TO, READ BACK BY AND VERIFIED WITH: M. LI AT 1030 ON 06/30/17 BY C. JESSUP, MLT.    Culture ESCHERICHIA COLI SUSCEPTIBILITIES TO FOLLOW  (A)  Final   Report Status PENDING  Incomplete  Blood Culture ID Panel (Reflexed)     Status: Abnormal   Collection Time: 06/29/17  9:15 AM  Result Value Ref Range Status   Enterococcus species NOT DETECTED NOT DETECTED Final   Listeria monocytogenes NOT DETECTED NOT DETECTED Final   Staphylococcus species NOT DETECTED NOT DETECTED Final   Staphylococcus aureus NOT DETECTED NOT DETECTED Final   Streptococcus species NOT  DETECTED NOT DETECTED Final   Streptococcus agalactiae NOT DETECTED NOT DETECTED Final   Streptococcus pneumoniae NOT DETECTED NOT DETECTED Final   Streptococcus pyogenes NOT DETECTED NOT DETECTED Final   Acinetobacter baumannii NOT DETECTED NOT DETECTED Final   Enterobacteriaceae species DETECTED (A) NOT DETECTED Final    Comment: Enterobacteriaceae represent a large family of gram-negative bacteria, not a single organism. CRITICAL RESULT CALLED TO, READ BACK BY AND VERIFIED WITH: M. LI, PHARMD AT 1030 ON 06/30/17 BY C. JESSUP, MLT.    Enterobacter cloacae complex NOT DETECTED NOT DETECTED Final  Escherichia coli DETECTED (A) NOT DETECTED Final    Comment: CRITICAL RESULT CALLED TO, READ BACK BY AND VERIFIED WITH: M. LI, PHARMD AT 1030 ON 06/30/17 BY C. JESSUP, MLT.    Klebsiella oxytoca NOT DETECTED NOT DETECTED Final   Klebsiella pneumoniae NOT DETECTED NOT DETECTED Final   Proteus species NOT DETECTED NOT DETECTED Final   Serratia marcescens NOT DETECTED NOT DETECTED Final   Carbapenem resistance NOT DETECTED NOT DETECTED Final   Haemophilus influenzae NOT DETECTED NOT DETECTED Final   Neisseria meningitidis NOT DETECTED NOT DETECTED Final   Pseudomonas aeruginosa NOT DETECTED NOT DETECTED Final   Candida albicans NOT DETECTED NOT DETECTED Final   Candida glabrata NOT DETECTED NOT DETECTED Final   Candida krusei NOT DETECTED NOT DETECTED Final   Candida parapsilosis NOT DETECTED NOT DETECTED Final   Candida tropicalis NOT DETECTED NOT DETECTED Final  Aerobic/Anaerobic Culture (surgical/deep wound)     Status: None (Preliminary result)   Collection Time: 06/29/17  4:05 PM  Result Value Ref Range Status   Specimen Description ABSCESS ABDOMEN  Final   Special Requests NONE  Final   Gram Stain   Final    ABUNDANT WBC PRESENT,BOTH PMN AND MONONUCLEAR ABUNDANT GRAM POSITIVE COCCI IN PAIRS MODERATE GRAM NEGATIVE RODS    Culture   Final    FEW ESCHERICHIA COLI MODERATE  STREPTOCOCCUS ANGINOSIS SUSCEPTIBILITIES TO FOLLOW HOLDING FOR POSSIBLE ANAEROBE    Report Status PENDING  Incomplete   Organism ID, Bacteria ESCHERICHIA COLI  Final      Susceptibility   Escherichia coli - MIC*    AMPICILLIN >=32 RESISTANT Resistant     CEFAZOLIN <=4 SENSITIVE Sensitive     CEFEPIME <=1 SENSITIVE Sensitive     CEFTAZIDIME <=1 SENSITIVE Sensitive     CEFTRIAXONE <=1 SENSITIVE Sensitive     CIPROFLOXACIN >=4 RESISTANT Resistant     GENTAMICIN <=1 SENSITIVE Sensitive     IMIPENEM <=0.25 SENSITIVE Sensitive     TRIMETH/SULFA >=320 RESISTANT Resistant     AMPICILLIN/SULBACTAM 16 INTERMEDIATE Intermediate     PIP/TAZO <=4 SENSITIVE Sensitive     Extended ESBL NEGATIVE Sensitive     * FEW ESCHERICHIA COLI  Aerobic/Anaerobic Culture (surgical/deep wound)     Status: None (Preliminary result)   Collection Time: 06/29/17  4:35 PM  Result Value Ref Range Status   Specimen Description TISSUE  Final   Special Requests PELVIC ABCESS CAVITY  Final   Gram Stain   Final    ABUNDANT WBC PRESENT,BOTH PMN AND MONONUCLEAR MODERATE GRAM POSITIVE COCCI IN PAIRS RARE GRAM NEGATIVE RODS    Culture   Final    FEW ESCHERICHIA COLI FEW STREPTOCOCCUS ANGINOSIS HOLDING FOR POSSIBLE ANAEROBE    Report Status PENDING  Incomplete   Organism ID, Bacteria ESCHERICHIA COLI  Final      Susceptibility   Escherichia coli - MIC*    AMPICILLIN >=32 RESISTANT Resistant     CEFAZOLIN <=4 SENSITIVE Sensitive     CEFEPIME <=1 SENSITIVE Sensitive     CEFTAZIDIME <=1 SENSITIVE Sensitive     CEFTRIAXONE <=1 SENSITIVE Sensitive     CIPROFLOXACIN >=4 RESISTANT Resistant     GENTAMICIN <=1 SENSITIVE Sensitive     IMIPENEM <=0.25 SENSITIVE Sensitive     TRIMETH/SULFA >=320 RESISTANT Resistant     AMPICILLIN/SULBACTAM >=32 RESISTANT Resistant     PIP/TAZO <=4 SENSITIVE Sensitive     Extended ESBL NEGATIVE Sensitive     * FEW ESCHERICHIA COLI  MRSA PCR Screening  Status: None   Collection Time:  06/29/17 11:33 PM  Result Value Ref Range Status   MRSA by PCR NEGATIVE NEGATIVE Final    Comment:        The GeneXpert MRSA Assay (FDA approved for NASAL specimens only), is one component of a comprehensive MRSA colonization surveillance program. It is not intended to diagnose MRSA infection nor to guide or monitor treatment for MRSA infections.    Pearson Grippe, DO IM PGY-1

## 2017-07-02 NOTE — Progress Notes (Signed)
[facility] Progress Note  3 Days Post-Op  Subjective: CC: abdominal pain Patient states abdominal pain is moderate and mostly just with movement, well controlled with pain medication. Denies n/v. Passing some flatus but no stool in colostomy. Getting some sensation that she has to urinate.  UOP improving, VSS.   Objective: Vital signs in last 24 hours: Temp:  [97.6 F (36.4 C)-98.6 F (37 C)] 98.6 F (37 C) (10/18 0324) Pulse Rate:  [65-86] 72 (10/18 0600) Resp:  [1-30] 24 (10/18 0847) BP: (106-138)/(61-83) 122/69 (10/18 0600) SpO2:  [98 %-100 %] 100 % (10/18 0847) Last BM Date: 06/29/17  Intake/Output from previous day: 10/17 0701 - 10/18 0700 In: 3923 [P.O.:330; I.V.:3243; IV Piggyback:350] Out: 1213 [Urine:1100; Drains:113] Intake/Output this shift: Total I/O In: -  Out: 50 [Drains:50]  PE: Gen:  Alert, NAD, pleasant Card:  Regular rate and rhythm, pedal pulses 2+ BL Pulm:  Normal effort, clear to auscultation bilaterally Abd: Soft, appropriately tender, non-distended, bowel sounds hypoactive, no HSM, midline incision clean with minimal non-purulent drainage, drain with serosanguinous drainage  GU: foley present with clear straw colored urine Skin: warm and dry, no rashes  Psych: A&Ox3   Lab Results:   Recent Labs  07/01/17 0310 07/02/17 0359  WBC 44.3* 28.9*  HGB 7.2* 7.5*  HCT 23.7* 24.4*  PLT 274 306   BMET  Recent Labs  07/01/17 0310 07/02/17 0359  NA 138 137  K 4.3 4.8  CL 107 108  CO2 23 22  GLUCOSE 96 74  BUN 30* 33*  CREATININE 1.84* 1.70*  CALCIUM 7.2* 7.7*   PT/INR No results for input(s): LABPROT, INR in the last 72 hours. CMP     Component Value Date/Time   NA 137 07/02/2017 0359   K 4.8 07/02/2017 0359   CL 108 07/02/2017 0359   CO2 22 07/02/2017 0359   GLUCOSE 74 07/02/2017 0359   BUN 33 (H) 07/02/2017 0359   CREATININE 1.70 (H) 07/02/2017 0359   CALCIUM 7.7 (L) 07/02/2017 0359   PROT 5.4 (L) 07/01/2017 0310    ALBUMIN 2.1 (L) 07/01/2017 0310   AST 20 07/01/2017 0310   ALT 26 07/01/2017 0310   ALKPHOS 92 07/01/2017 0310   BILITOT 1.1 07/01/2017 0310   GFRNONAA 31 (L) 07/02/2017 0359   GFRAA 36 (L) 07/02/2017 0359   Lipase     Component Value Date/Time   LIPASE 22 06/29/2017 0824       Studies/Results: US Renal  Result Date: 07/01/2017 CLINICAL DATA:  Elevated serum creatinine EXAM: RENAL / URINARY TRACT ULTRASOUND COMPLETE COMPARISON:  Noncontrast CT scan of the abdomen and pelvis of June 29, 2017 FINDINGS: Right Kidney: Length: 11.8 cm. The renal cortical echotexture is increased and is greater than that of the liver. There is no hydronephrosis. Left Kidney: Length: 12.2 cm. The renal cortical echotexture is increased similar to that on the right. There is no hydronephrosis. Bladder: The urinary bladder is decompressed by Foley catheter. IMPRESSION: Increased renal cortical echotexture bilaterally compatible with medical renal disease. No hydronephrosis. Electronically Signed   By: David  Martinique M.D.   On: 07/01/2017 12:01    Anti-infectives: Anti-infectives    Start     Dose/Rate Route Frequency Ordered Stop   07/01/17 1030  fluconazole (DIFLUCAN) IVPB 400 mg     400 mg 100 mL/hr over 120 Minutes Intravenous Every 24 hours 06/30/17 0957     06/30/17 1830  piperacillin-tazobactam (ZOSYN) IVPB 3.375 g     3.375 g  12.5 mL/hr over 240 Minutes Intravenous Every 8 hours 06/30/17 1106     06/30/17 1115  piperacillin-tazobactam (ZOSYN) IVPB 3.375 g     3.375 g 100 mL/hr over 30 Minutes Intravenous  Once 06/30/17 1046 06/30/17 1412   06/30/17 1030  fluconazole (DIFLUCAN) IVPB 800 mg     800 mg 200 mL/hr over 120 Minutes Intravenous  Once 06/30/17 0957 06/30/17 1537   06/29/17 2130  ciprofloxacin (CIPRO) IVPB 400 mg  Status:  Discontinued     400 mg 200 mL/hr over 60 Minutes Intravenous Every 12 hours 06/29/17 1106 06/30/17 1046   06/29/17 1730  metroNIDAZOLE (FLAGYL) IVPB 500 mg   Status:  Discontinued     500 mg 100 mL/hr over 60 Minutes Intravenous Every 8 hours 06/29/17 1106 06/30/17 1046   06/29/17 1000  metroNIDAZOLE (FLAGYL) IVPB 500 mg  Status:  Discontinued     500 mg 100 mL/hr over 60 Minutes Intravenous Every 8 hours 06/29/17 0915 06/29/17 1117   06/29/17 1000  ciprofloxacin (CIPRO) IVPB 400 mg  Status:  Discontinued     400 mg 200 mL/hr over 60 Minutes Intravenous Every 12 hours 06/29/17 0915 06/29/17 1117       Assessment/Plan Sigmoid diverticulitis with perforation and abscess  S/P ex lap with sigmoid colectomy, left salpingectomy, repair of cystotomy, colostomy - POD#3 - pain well controlled - d/c PCA - tolerating clears and having gas output in ostomy - continue clears - drain with 115 cc output recorded in 24 h - continue drain - foley with clear straw urine, patient having some bladder spasms - will start urecholine - mobilize, OOB, IS AKI - SCr 1.7 from 1.8 yesterday, continue IV fluids, continue to monitor - renal US showed medical disease, no hydronephrosis Bacteremia/Fungemia - ID involved, appreciate assistance - TTE today - blood cxs with polymicrobial growth +candida - WBC 28.9 from 44.3 yesterday, afebrile - continue IV zosyn and IV fluconazole  ABL Anemia - Hgb 7.5, stable, continue to monitor 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 Continue dressing changes. Mobilize. Start urecholine for bladder spasms. TTE today. Patient stable for transfer to the floor.   LOS: 3 days    Brigid Re , [facility] 07/02/2017, 9:00 AM Pager: [phone] Consults: [phone] Mon-Fri 7:00 am-4:30 pm Sat-Sun 7:00 am-11:30 am

## 2017-07-02 NOTE — Progress Notes (Signed)
Physical Therapy Treatment Patient Details Name: Lindsay [name] MRN: [id number] DOB: [birth year] Today's Date: 07/02/2017    History of Present Illness Pt is a 66 y.o. female admitted with perforated diverticulitis. She underwent laparotomy/sigmoid colectomy 06-29-17.    PT Comments    Pt very motivated to participate with therapy. Medical/pain issues continue to hinder her progress. During today's session, pt having bladder spasms. She describes it as feeling like she needs to continually urinate. Pt rates as 10/10 and moaning/profusely sweating throughout session. Attempted ambulation but pt unable to tolerate. RN gave pain meds during session. Dovetail Rx with OT for plans to address increased ambulation and ADLs. Pt, however, unable to tolerate and required return to bed.  D/C recommendations updated from no follow up services to home health PT. Pt has all needed DME.    Follow Up Recommendations  Home health PT;Supervision/Assistance - 24 hour     Equipment Recommendations  None recommended by PT    Recommendations for Other Services       Precautions / Restrictions Precautions Precautions: Fall;Other (comment) Precaution Comments: multi lines/drains    Mobility  Bed Mobility Overal bed mobility: Needs Assistance         Sit to supine: Min assist   General bed mobility comments: assist with BLE into bed  Transfers Overall transfer level: Needs assistance Equipment used: Rolling walker (2 wheeled)   Sit to Stand: Min assist Stand pivot transfers: Min assist;+2 safety/equipment       General transfer comment: verbal cues for hand placement, assist to power up  Ambulation/Gait Ambulation/Gait assistance: Min assist Ambulation Distance (Feet): 5 Feet Assistive device: Rolling walker (2 wheeled) Gait Pattern/deviations: Step-through pattern;Decreased stride length Gait velocity: decreased Gait velocity interpretation: Below normal speed for age/gender General  Gait Details: distance limited by pain (bladder spasms)   Stairs            Wheelchair Mobility    Modified Rankin (Stroke Patients Only)       Balance   Sitting-balance support: No upper extremity supported;Feet supported Sitting balance-Leahy Scale: Good     Standing balance support: Bilateral upper extremity supported;During functional activity Standing balance-Leahy Scale: Poor Standing balance comment: reliant on UE support                             Cognition Arousal/Alertness: Awake/alert Behavior During Therapy: WFL for tasks assessed/performed Overall Cognitive Status: Within Functional Limits for tasks assessed                                        Exercises      General Comments General comments (skin integrity, edema, etc.): VSS      Pertinent Vitals/Pain Pain Assessment: 0-10 Pain Score: 10-Worst pain ever Pain Location: bladder spasms Pain Descriptors / Indicators: Grimacing;Spasm;Guarding;Moaning Pain Intervention(s): Limited activity within patient's tolerance;Monitored during session;RN gave pain meds during session;Repositioned    Home Living                      Prior Function            PT Goals (current goals can now be found in the care plan section) Acute Rehab PT Goals Patient Stated Goal: home PT Goal Formulation: With patient Time For Goal Achievement: 07/14/17 Potential to Achieve Goals: Good Progress towards PT goals: Progressing toward goals (  slowly)    Frequency    Min 3X/week      PT Plan Discharge plan needs to be updated;Equipment recommendations need to be updated    Co-evaluation PT/OT/SLP Co-Evaluation/Treatment: Yes Reason for Co-Treatment: For patient/therapist safety;To address functional/ADL transfers PT goals addressed during session: Mobility/safety with mobility        AM-PAC PT "6 Clicks" Daily Activity  Outcome Measure  Difficulty turning over in bed  (including adjusting bedclothes, sheets and blankets)?: A Lot Difficulty moving from lying on back to sitting on the side of the bed? : Unable Difficulty sitting down on and standing up from a chair with arms (e.g., wheelchair, bedside commode, etc,.)?: Unable Help needed moving to and from a bed to chair (including a wheelchair)?: A Little Help needed walking in hospital room?: A Little Help needed climbing 3-5 steps with a railing? : A Lot 6 Click Score: 12    End of Session Equipment Utilized During Treatment: Gait belt Activity Tolerance: Patient limited by pain Patient left: in bed;with call bell/phone within reach Nurse Communication: Mobility status PT Visit Diagnosis: Difficulty in walking, not elsewhere classified (R26.2);Pain     Time: 5361-4431 PT Time Calculation (min) (ACUTE ONLY): 40 min  Charges:  $Gait Training: 8-22 mins $Therapeutic Activity: 8-22 mins                    G Codes:       Lindsay [name], PT  Office # [phone] Pager [phone]    Lindsay [name] 07/02/2017, 1:41 PM

## 2017-07-03 LAB — AEROBIC/ANAEROBIC CULTURE W GRAM STAIN (SURGICAL/DEEP WOUND)

## 2017-07-03 LAB — BASIC METABOLIC PANEL
Anion gap: 11 (ref 5–15)
BUN: 26 mg/dL — ABNORMAL HIGH (ref 6–20)
CO2: 19 mmol/L — ABNORMAL LOW (ref 22–32)
Calcium: 8.2 mg/dL — ABNORMAL LOW (ref 8.9–10.3)
Chloride: 105 mmol/L (ref 101–111)
Creatinine, Ser: 1.57 mg/dL — ABNORMAL HIGH (ref 0.44–1.00)
GFR calc Af Amer: 39 mL/min — ABNORMAL LOW (ref >60)
GFR calc non Af Amer: 34 mL/min — ABNORMAL LOW (ref >60)
Glucose, Bld: 96 mg/dL (ref 65–99)
Potassium: 5 mmol/L (ref 3.5–5.1)
Sodium: 135 mmol/L (ref 135–145)

## 2017-07-03 LAB — CBC WITH DIFFERENTIAL/PLATELET
Basophils Absolute: 0 10*3/uL (ref 0.0–0.1)
Basophils Relative: 0 %
Eosinophils Absolute: 0.1 10*3/uL (ref 0.0–0.7)
Eosinophils Relative: 1 %
HCT: 28 % — ABNORMAL LOW (ref 36.0–46.0)
Hemoglobin: 8.5 g/dL — ABNORMAL LOW (ref 12.0–15.0)
Lymphocytes Relative: 13 %
Lymphs Abs: 2.5 10*3/uL (ref 0.7–4.0)
MCH: 24.6 pg — ABNORMAL LOW (ref 26.0–34.0)
MCHC: 30.4 g/dL (ref 30.0–36.0)
MCV: 81.2 fL (ref 78.0–100.0)
Monocytes Absolute: 0.6 10*3/uL (ref 0.1–1.0)
Monocytes Relative: 3 %
Neutro Abs: 16.3 10*3/uL — ABNORMAL HIGH (ref 1.7–7.7)
Neutrophils Relative %: 83 %
Platelets: 351 10*3/uL (ref 150–400)
RBC: 3.45 MIL/uL — ABNORMAL LOW (ref 3.87–5.11)
RDW: 17.5 % — ABNORMAL HIGH (ref 11.5–15.5)
WBC: 19.5 10*3/uL — ABNORMAL HIGH (ref 4.0–10.5)

## 2017-07-03 LAB — PHOSPHORUS: Phosphorus: 2.9 mg/dL (ref 2.5–4.6)

## 2017-07-03 LAB — MAGNESIUM: Magnesium: 2.1 mg/dL (ref 1.7–2.4)

## 2017-07-03 NOTE — Progress Notes (Signed)
Pharmacy Antibiotic Note  Lindsay [name] is a 66 y.o. female admitted on 06/29/2017 with perforated diverticulitis.  Pharmacy has been consulted for fluconazole and Zosyn dosing for E coli and Strep anginosis bacteremia. BCID showed C tropicalis although this has not grown in culture to date. Repeat BCx ngtd. ID following. Renal function is improving so will keep current abx dosing.  Plan: Continue fluconazole 400 mg IV q24h Continue Zosyn 3.375g q8h, 4 hour infusion Monitor renal function, culture data, and overall clinical course   Height: 5\' 3"  (160 cm) Weight: 206 lb 4.8 oz (93.6 kg) IBW/kg (Calculated) : 52.4  Temp (24hrs), Avg:98.2 F (36.8 C), Min:97.8 F (36.6 C), Max:98.5 F (36.9 C)   Recent Labs Lab 06/29/17 0824 06/29/17 0838 06/29/17 1156 06/29/17 1632 06/30/17 0543 06/30/17 1554 07/01/17 0310 07/02/17 0359 07/03/17 0435  WBC 33.7*  --   --   --  48.4* 47.6* 44.3* 28.9* 19.5*  CREATININE 2.11*  --   --   --  1.77*  --  1.84* 1.70* 1.57*  LATICACIDVEN  --  6.44* 4.07* 1.7  --   --   --   --   --     Estimated Creatinine Clearance: 39.4 mL/min (A) (by C-G formula based on SCr of 1.57 mg/dL (H)).    Allergies  Allergen Reactions  . Ancef [Cefazolin] Rash    Tolerates Zosyn  . Primaxin [Imipenem] Swelling    Developed lip swelling post administration of Primaxin even though she had tolerated this in the past.  . Heparin     (HIT antibody positive; SRA negative  . Vancomycin Rash    Antimicrobials this admission: Ciprofloxacin 10/15 >> 10/16 Flagyl 10/15 >> 10/16 Zosyn 10/16 >> Fluconazole 10/16 >>  Dose adjustments this admission: None  Microbiology results: 10/15: Bcx: E.coli (pan-S EXCEPT R-amp/unasyn/cipro/bactrim), Strep anginosis (pan-S) and C.tropicalis per BCID 10/15 pelvic abscess cavity: px 10/15 MRSA PCR: neg 10/18 BCx:   Thank you for allowing pharmacy to be a part of this patient's care.  Renold Genta, PharmD, BCPS Clinical  Pharmacist Phone for today - [place] - [phone] 07/03/2017 1:45 PM

## 2017-07-03 NOTE — Care Management Note (Addendum)
Case Management Note  Patient Details  Name: Breniya Goertzen MRN: [id number] Date of Birth: [birth year]  Subjective/Objective:                    Action/Plan: Will need home health RN and PT orders Thanks   Discussed discharge planning with patient. See previous CM note . Patient does want NCM to order tub bench. Provided list of home health agencies , she will discuss with her daughter before deciding on agency.   Faxed [employer] [facility] application.   Expected Discharge Date:  07/03/17               Expected Discharge Plan:  [place]  In-House Referral:  NA  Discharge planning Services  CM Consult  Post Acute Care Choice:  Home Health Choice offered to:  Adult Children, Patient  DME Arranged:  Vac, Tub bench DME Agency:  [place]  HH Arranged:    [facility] Agency:     Status of Service:  In process, will continue to follow  If discussed at Long Length of Stay Meetings, dates discussed:    Additional Comments:  Marilu Favre, RN 07/03/2017, 2:17 PM

## 2017-07-03 NOTE — Progress Notes (Signed)
MD please note that the patient potassium is 5 and pharmacy left stickey note to consider removing the potassium from IV fluids. Will continue to monitor. Arthor Captain LPN

## 2017-07-03 NOTE — Progress Notes (Signed)
Physical Therapy Treatment Patient Details Name: Lindsay [name] MRN: [id number] DOB: Apr 14, 1951 Today's Date: 07/03/2017    History of Present Illness Pt is a 66 y.o. female admitted with perforated diverticulitis. She underwent laparotomy/sigmoid colectomy 06-29-17. Pertinent PMH includes lymphedema, HTN, cellulitis.    PT Comments    Pt slowly progressing with mobility. Limited this session by increased bladder spasms when sitting forward in preparation for standing; RN notified and able to administer medications which pt reports helped relieve some pressure. Willing to ambulate in room with max encouragement, requiring RW and min guard for balance; increased time and effort for transfers, amb, and bed mobility secondary to pain and fatigue. Daughter present during session and very motivating for pt. Will continue to follow acutely.   Follow Up Recommendations  Home health PT;Supervision/Assistance - 24 hour     Equipment Recommendations  None recommended by PT    Recommendations for Other Services       Precautions / Restrictions Precautions Precautions: Fall;Other (comment) Precaution Comments: Vound vac, JP drain, PICC Restrictions Weight Bearing Restrictions: No    Mobility  Bed Mobility Overal bed mobility: Needs Assistance Bed Mobility: Sit to Supine       Sit to supine: Min guard   General bed mobility comments: Significant time and effort to get BLEs into bed, but no physical assist required. Mod indep for scooting up in bed with use of bed rails  Transfers Overall transfer level: Needs assistance Equipment used: Rolling walker (2 wheeled) Transfers: Sit to/from Stand   Stand pivot transfers: Min guard       General transfer comment: Stood with RW and min guard for balance.  Ambulation/Gait Ambulation/Gait assistance: Min guard Ambulation Distance (Feet): 30 Feet Assistive device: Rolling walker (2 wheeled) Gait Pattern/deviations: Step-through  pattern;Decreased stride length;Trunk flexed Gait velocity: decreased Gait velocity interpretation: <1.8 ft/sec, indicative of risk for recurrent falls General Gait Details: Amb in room with RW and min guard for balance. Pt initially declining mobility beyond chair-to-bed due to pain with bladder spasms, but agreeable to amb in room with max encouragement   Stairs            Wheelchair Mobility    Modified Rankin (Stroke Patients Only)       Balance Overall balance assessment: Needs assistance Sitting-balance support: No upper extremity supported;Feet supported Sitting balance-Leahy Scale: Good     Standing balance support: Bilateral upper extremity supported;During functional activity Standing balance-Leahy Scale: Poor Standing balance comment: reliant on UE support                             Cognition Arousal/Alertness: Awake/alert Behavior During Therapy: WFL for tasks assessed/performed Overall Cognitive Status: Within Functional Limits for tasks assessed                                        Exercises      General Comments        Pertinent Vitals/Pain Pain Assessment: Faces Faces Pain Scale: Hurts a little bit Pain Location: bladder spasms Pain Descriptors / Indicators: Pressure Pain Intervention(s): Limited activity within patient's tolerance;RN gave pain meds during session    Home Living                      Prior Function  PT Goals (current goals can now be found in the care plan section) Acute Rehab PT Goals Patient Stated Goal: Return home PT Goal Formulation: With patient Time For Goal Achievement: 07/14/17 Potential to Achieve Goals: Good Progress towards PT goals: Progressing toward goals    Frequency    Min 3X/week      PT Plan Current plan remains appropriate    Co-evaluation              AM-PAC PT "6 Clicks" Daily Activity  Outcome Measure  Difficulty turning over in  bed (including adjusting bedclothes, sheets and blankets)?: A Little Difficulty moving from lying on back to sitting on the side of the bed? : A Lot Difficulty sitting down on and standing up from a chair with arms (e.g., wheelchair, bedside commode, etc,.)?: A Little Help needed moving to and from a bed to chair (including a wheelchair)?: A Little Help needed walking in hospital room?: A Little Help needed climbing 3-5 steps with a railing? : A Lot 6 Click Score: 16    End of Session Equipment Utilized During Treatment: Gait belt Activity Tolerance: Patient limited by pain Patient left: in bed;with call bell/phone within reach;with family/visitor present Nurse Communication: Mobility status PT Visit Diagnosis: Difficulty in walking, not elsewhere classified (R26.2);Pain     Time: 4259-5638 PT Time Calculation (min) (ACUTE ONLY): 26 min  Charges:  $Gait Training: 8-22 mins $Therapeutic Activity: 8-22 mins                    G Codes:      Mabeline Caras, PT, DPT Acute Rehab Services  Pager: [place] 07/03/2017, 4:34 PM

## 2017-07-03 NOTE — Consult Note (Addendum)
[facility] Nurse wound follow up Surgical team following for assessment and plan of care to abd wound. Requested to apply Vac dressing to wound Wound type: Full thickness post-op wound to midline abd Measurement: 20X4X2cm Wound bed: clean, red with yellow adipose tissue interspersed throughout Drainage (amount, consistency, odor) small amt yellow drainage, no odor Periwound: intact skin surrounding Dressing procedure/placement/frequency: Applied one piece of black foam to 175mm cont suction.  Pt medicated for pain prior to the procedure and tolerated with mod amt discomfort.  WOC will plan to change dressing Q M/W/F since ostomy pouch is located next to the wound and both dressing and pouch will need to be changed each time.  [place] Nurse ostomy follow-up consult note  Stoma type/location: Colostomy stoma from surgery on 10/15. Stomal assessment/size: Stoma red and viable, flush with skin level, 1 inch Peristomal assessment: intact skin surrounding Output: Scant amt brown liquid, no flatus  Ostomy pouching: 1pc.  Education provided:  Demonstrated pouch change and patient watched the procedure but did not look at the stoma.  Applied barrier ring and one piece pouch. Patient asked appropriate questions. She was able to open and close the velcro to empty.  Supplies ordered to room for bedside nurse use.  Pt will need flexible convex pouches.  Reviewed pouching routines and ordering supplies.  Educational materials left at the bedside. WOC will continue to follow for further teaching sessions. Enrolled patient in [place] program: Yes Julien Girt MSN, RN, [place], [place], [place]

## 2017-07-03 NOTE — Progress Notes (Addendum)
[facility] for Infectious Disease                   Date of Admission:  06/29/2017                                 Total days of antibiotics 5                                                                                     Day 4 Zosyn                                                                                     Day 5 Fluconazole                                                                                     Cipro/Flagyl 10/15-10/16                                                              Reason for Consult: Bacteremia / Fungemia                           Assessment:  Intraabdominal Abscesses with secondary polymicrobial (strep and ecoli) Bacteremia, Fungemia  Patient presented with LLQ pain fevers, and chills. She was febrile with leukocytosis and CT showin Intraabdominal abscesses. Abscess drained by surgery (10/15) and were found to have eroded into both the colon and left fallopian tube. 24hr Tmax 98.5. WBC Trending down 44.3. -> 28.9 -> 19.5 - Abdominal Abscess Cultures (10/15) Strep Anginosis, E. Coli - Blood Cultures (10/15): Strep Anginosis, E. Coli - BCID 1 (10/15): Strep Species, Candida Tropicalis - BCID 2: (10/15): E. Coli - MRSA Nasal PCR: Negative - Repeat Blood Cultures (10/18): NGTD  Plan:  - Continue pip/tazo to cover strep and ecoli. - Transition to PO Fluconazole; (C.tropicalis on BCID, but not grown in culture) - Repeat Blood Cultures drawn 10/18. If positive, recommend TTE to evaluate for fungal endocarditis - If 10/18 cultures remain negative on 10/21, exchange central catheter - Patient to be treated for 2 week course with antibiotics and antifungals with start date of 10/18 (provided culture remains negative)   ---------  I have seen and examined the patient with Dr Trilby Drummer. Agree with plan listed above. Will see back on Monday. Dr hatcher available for questions over the weekend.  Active Problems:   Diverticulitis of colon with  perforation   Diverticulitis   Scheduled Meds: . Chlorhexidine Gluconate Cloth  6 each Topical Daily  . oxybutynin  5 mg Oral TID  . pantoprazole (PROTONIX) IV  40 mg Intravenous QHS   Continuous Infusions: . 0.45 % NaCl with KCl 20 mEq / L 50 mL/hr at 07/03/17 1050  . sodium chloride    . fluconazole (DIFLUCAN) IV Stopped (07/03/17 1050)  . piperacillin-tazobactam (ZOSYN)  IV 3.375 g (07/03/17 1426)    SUBJECTIVE: Patient resting in chair. Feeling okay today. Remains afebrile. States she is getting up and walking with the assistance of physical therapy.  Review of Systems: Review of Systems  Constitutional: Negative.   HENT: Negative.   Respiratory: Negative.   Cardiovascular: Negative.   Gastrointestinal:       Abdominal discomfort  Skin: Negative.     Allergies  Allergen Reactions  . Ancef [Cefazolin] Rash    Tolerates Zosyn  . Primaxin [Imipenem] Swelling    Developed lip swelling post administration of Primaxin even though she had tolerated this in the past.  . Heparin     (HIT antibody positive; SRA negative  . Vancomycin Rash    OBJECTIVE: Vitals:   07/02/17 1901 07/02/17 2159 07/03/17 0445 07/03/17 1437  BP: 134/65 133/65 (!) 127/57 (!) 147/62  Pulse: 84 84 89 87  Resp: 18 20 18 18   Temp: 98.5 F (36.9 C) 97.8 F (36.6 C) 98.2 F (36.8 C) 98.7 F (37.1 C)  TempSrc: Oral Oral Oral Oral  SpO2: 97% 98% 97% 99%  Weight:      Height:       Body mass index is 36.54 kg/m.  Physical Exam  Constitutional: She is oriented to person, place, and time and well-developed, well-nourished, and in no distress.  HENT:  Head: Normocephalic and atraumatic.  Eyes: Conjunctivae and EOM are normal.  Neck:  R IJ double lumen catheter in place  Cardiovascular: Normal rate, regular rhythm and normal heart sounds.   Pulmonary/Chest: Effort normal and breath sounds normal. No respiratory distress.  Abdominal: Soft. She exhibits no distension.  Wound vac in place JP  drain with minmal serosanguinous fluid   Musculoskeletal: She exhibits no edema.  Neurological: She is alert and oriented to person, place, and time.  Skin: Skin is warm and dry.  Scaring from healed ulcers bilateral lower exrtremities     Lab Results Lab Results  Component Value Date   WBC 19.5 (H) 07/03/2017   HGB 8.5 (L) 07/03/2017   HCT 28.0 (L) 07/03/2017   MCV 81.2 07/03/2017   PLT 351 07/03/2017    Lab Results  Component Value Date   CREATININE 1.57 (H) 07/03/2017   BUN 26 (H) 07/03/2017   NA 135 07/03/2017   K 5.0 07/03/2017   CL 105 07/03/2017   CO2 19 (L) 07/03/2017    Lab Results  Component Value Date   ALT 26 07/01/2017   AST 20 07/01/2017   ALKPHOS 92 07/01/2017   BILITOT 1.1 07/01/2017     Microbiology: Recent Results (from the past 240 hour(s))  Blood Culture (routine x 2)     Status: Abnormal   Collection Time: 06/29/17  8:30 AM  Result Value Ref Range Status   Specimen Description BLOOD LEFT HAND  Final  Special Requests IN PEDIATRIC BOTTLE Blood Culture adequate volume  Final   Culture  Setup Time   Final    GRAM POSITIVE COCCI IN CHAINS IN PEDIATRIC BOTTLE CRITICAL RESULT CALLED TO, READ BACK BY AND VERIFIED WITH: M. Bufford Spikes.Ph. 9:45 06/30/17 ([email or web address])    Culture STREPTOCOCCUS ANGINOSIS (A)  Final   Report Status 07/02/2017 FINAL  Final   Organism ID, Bacteria STREPTOCOCCUS ANGINOSIS  Final      Susceptibility   Streptococcus anginosis - MIC*    PENICILLIN <=0.06 SENSITIVE Sensitive     CEFTRIAXONE 0.25 SENSITIVE Sensitive     ERYTHROMYCIN <=0.12 SENSITIVE Sensitive     LEVOFLOXACIN 0.5 SENSITIVE Sensitive     VANCOMYCIN 1 SENSITIVE Sensitive     * STREPTOCOCCUS ANGINOSIS  Blood Culture ID Panel (Reflexed)     Status: Abnormal   Collection Time: 06/29/17  8:30 AM  Result Value Ref Range Status   Enterococcus species NOT DETECTED NOT DETECTED Final   Listeria monocytogenes NOT DETECTED NOT DETECTED Final   Staphylococcus species NOT  DETECTED NOT DETECTED Final   Staphylococcus aureus NOT DETECTED NOT DETECTED Final   Streptococcus species DETECTED (A) NOT DETECTED Final    Comment: Not Enterococcus species, Streptococcus agalactiae, Streptococcus pyogenes, or Streptococcus pneumoniae. CRITICAL RESULT CALLED TO, READ BACK BY AND VERIFIED WITH: M. Bufford Spikes.Ph. 9:45 06/30/17 (wilsonm)    Streptococcus agalactiae NOT DETECTED NOT DETECTED Final   Streptococcus pneumoniae NOT DETECTED NOT DETECTED Final   Streptococcus pyogenes NOT DETECTED NOT DETECTED Final   Acinetobacter baumannii NOT DETECTED NOT DETECTED Final   Enterobacteriaceae species NOT DETECTED NOT DETECTED Final   Enterobacter cloacae complex NOT DETECTED NOT DETECTED Final   Escherichia coli NOT DETECTED NOT DETECTED Final   Klebsiella oxytoca NOT DETECTED NOT DETECTED Final   Klebsiella pneumoniae NOT DETECTED NOT DETECTED Final   Proteus species NOT DETECTED NOT DETECTED Final   Serratia marcescens NOT DETECTED NOT DETECTED Final   Haemophilus influenzae NOT DETECTED NOT DETECTED Final   Neisseria meningitidis NOT DETECTED NOT DETECTED Final   Pseudomonas aeruginosa NOT DETECTED NOT DETECTED Final   Candida albicans NOT DETECTED NOT DETECTED Final   Candida glabrata NOT DETECTED NOT DETECTED Final   Candida krusei NOT DETECTED NOT DETECTED Final   Candida parapsilosis NOT DETECTED NOT DETECTED Final   Candida tropicalis DETECTED (A) NOT DETECTED Final    Comment: CRITICAL RESULT CALLED TO, READ BACK BY AND VERIFIED WITH: M. Bufford Spikes.Ph. 9:45 06/30/17 (wilsonm)   Blood Culture (routine x 2)     Status: Abnormal   Collection Time: 06/29/17  9:15 AM  Result Value Ref Range Status   Specimen Description BLOOD  Final   Special Requests IN PEDIATRIC BOTTLE Blood Culture adequate volume  Final   Culture  Setup Time   Final    GRAM NEGATIVE RODS IN PEDIATRIC BOTTLE CRITICAL RESULT CALLED TO, READ BACK BY AND VERIFIED WITH: M. LI AT 1030 ON 06/30/17 BY C. JESSUP,  MLT.    Culture ESCHERICHIA COLI (A)  Final   Report Status 07/02/2017 FINAL  Final   Organism ID, Bacteria ESCHERICHIA COLI  Final      Susceptibility   Escherichia coli - MIC*    AMPICILLIN >=32 RESISTANT Resistant     CEFAZOLIN <=4 SENSITIVE Sensitive     CEFEPIME <=1 SENSITIVE Sensitive     CEFTAZIDIME <=1 SENSITIVE Sensitive     CEFTRIAXONE <=1 SENSITIVE Sensitive     CIPROFLOXACIN >=4 RESISTANT Resistant  GENTAMICIN <=1 SENSITIVE Sensitive     IMIPENEM <=0.25 SENSITIVE Sensitive     TRIMETH/SULFA >=320 RESISTANT Resistant     AMPICILLIN/SULBACTAM >=32 RESISTANT Resistant     PIP/TAZO <=4 SENSITIVE Sensitive     Extended ESBL NEGATIVE Sensitive     * ESCHERICHIA COLI  Blood Culture ID Panel (Reflexed)     Status: Abnormal   Collection Time: 06/29/17  9:15 AM  Result Value Ref Range Status   Enterococcus species NOT DETECTED NOT DETECTED Final   Listeria monocytogenes NOT DETECTED NOT DETECTED Final   Staphylococcus species NOT DETECTED NOT DETECTED Final   Staphylococcus aureus NOT DETECTED NOT DETECTED Final   Streptococcus species NOT DETECTED NOT DETECTED Final   Streptococcus agalactiae NOT DETECTED NOT DETECTED Final   Streptococcus pneumoniae NOT DETECTED NOT DETECTED Final   Streptococcus pyogenes NOT DETECTED NOT DETECTED Final   Acinetobacter baumannii NOT DETECTED NOT DETECTED Final   Enterobacteriaceae species DETECTED (A) NOT DETECTED Final    Comment: Enterobacteriaceae represent a large family of gram-negative bacteria, not a single organism. CRITICAL RESULT CALLED TO, READ BACK BY AND VERIFIED WITH: M. LI, PHARMD AT 1030 ON 06/30/17 BY C. JESSUP, MLT.    Enterobacter cloacae complex NOT DETECTED NOT DETECTED Final   Escherichia coli DETECTED (A) NOT DETECTED Final    Comment: CRITICAL RESULT CALLED TO, READ BACK BY AND VERIFIED WITH: M. LI, PHARMD AT 1030 ON 06/30/17 BY C. JESSUP, MLT.    Klebsiella oxytoca NOT DETECTED NOT DETECTED Final   Klebsiella  pneumoniae NOT DETECTED NOT DETECTED Final   Proteus species NOT DETECTED NOT DETECTED Final   Serratia marcescens NOT DETECTED NOT DETECTED Final   Carbapenem resistance NOT DETECTED NOT DETECTED Final   Haemophilus influenzae NOT DETECTED NOT DETECTED Final   Neisseria meningitidis NOT DETECTED NOT DETECTED Final   Pseudomonas aeruginosa NOT DETECTED NOT DETECTED Final   Candida albicans NOT DETECTED NOT DETECTED Final   Candida glabrata NOT DETECTED NOT DETECTED Final   Candida krusei NOT DETECTED NOT DETECTED Final   Candida parapsilosis NOT DETECTED NOT DETECTED Final   Candida tropicalis NOT DETECTED NOT DETECTED Final  Aerobic/Anaerobic Culture (surgical/deep wound)     Status: None   Collection Time: 06/29/17  4:05 PM  Result Value Ref Range Status   Specimen Description ABSCESS ABDOMEN  Final   Special Requests NONE  Final   Gram Stain   Final    ABUNDANT WBC PRESENT,BOTH PMN AND MONONUCLEAR ABUNDANT GRAM POSITIVE COCCI IN PAIRS MODERATE GRAM NEGATIVE RODS    Culture   Final    FEW ESCHERICHIA COLI MODERATE STREPTOCOCCUS ANGINOSIS MIXED ANAEROBIC FLORA PRESENT.  CALL LAB IF FURTHER IID REQUIRED.    Report Status 07/03/2017 FINAL  Final   Organism ID, Bacteria ESCHERICHIA COLI  Final   Organism ID, Bacteria STREPTOCOCCUS ANGINOSIS  Final      Susceptibility   Escherichia coli - MIC*    AMPICILLIN >=32 RESISTANT Resistant     CEFAZOLIN <=4 SENSITIVE Sensitive     CEFEPIME <=1 SENSITIVE Sensitive     CEFTAZIDIME <=1 SENSITIVE Sensitive     CEFTRIAXONE <=1 SENSITIVE Sensitive     CIPROFLOXACIN >=4 RESISTANT Resistant     GENTAMICIN <=1 SENSITIVE Sensitive     IMIPENEM <=0.25 SENSITIVE Sensitive     TRIMETH/SULFA >=320 RESISTANT Resistant     AMPICILLIN/SULBACTAM 16 INTERMEDIATE Intermediate     PIP/TAZO <=4 SENSITIVE Sensitive     Extended ESBL NEGATIVE Sensitive     *  FEW ESCHERICHIA COLI   Streptococcus anginosis - MIC*    PENICILLIN <=0.06 SENSITIVE Sensitive      CEFTRIAXONE 0.25 SENSITIVE Sensitive     ERYTHROMYCIN <=0.12 SENSITIVE Sensitive     LEVOFLOXACIN 0.5 SENSITIVE Sensitive     VANCOMYCIN 1 SENSITIVE Sensitive     * MODERATE STREPTOCOCCUS ANGINOSIS  Aerobic/Anaerobic Culture (surgical/deep wound)     Status: None   Collection Time: 06/29/17  4:35 PM  Result Value Ref Range Status   Specimen Description TISSUE  Final   Special Requests PELVIC ABCESS CAVITY  Final   Gram Stain   Final    ABUNDANT WBC PRESENT,BOTH PMN AND MONONUCLEAR MODERATE GRAM POSITIVE COCCI IN PAIRS RARE GRAM NEGATIVE RODS    Culture   Final    FEW ESCHERICHIA COLI FEW STREPTOCOCCUS ANGINOSIS SUSCEPTIBILITIES PERFORMED ON PREVIOUS CULTURE WITHIN THE LAST 5 DAYS. FEW BACTEROIDES THETAIOTAOMICRON BETA LACTAMASE POSITIVE    Report Status 07/03/2017 FINAL  Final   Organism ID, Bacteria ESCHERICHIA COLI  Final      Susceptibility   Escherichia coli - MIC*    AMPICILLIN >=32 RESISTANT Resistant     CEFAZOLIN <=4 SENSITIVE Sensitive     CEFEPIME <=1 SENSITIVE Sensitive     CEFTAZIDIME <=1 SENSITIVE Sensitive     CEFTRIAXONE <=1 SENSITIVE Sensitive     CIPROFLOXACIN >=4 RESISTANT Resistant     GENTAMICIN <=1 SENSITIVE Sensitive     IMIPENEM <=0.25 SENSITIVE Sensitive     TRIMETH/SULFA >=320 RESISTANT Resistant     AMPICILLIN/SULBACTAM >=32 RESISTANT Resistant     PIP/TAZO <=4 SENSITIVE Sensitive     Extended ESBL NEGATIVE Sensitive     * FEW ESCHERICHIA COLI  MRSA PCR Screening     Status: None   Collection Time: 06/29/17 11:33 PM  Result Value Ref Range Status   MRSA by PCR NEGATIVE NEGATIVE Final    Comment:        The GeneXpert MRSA Assay (FDA approved for NASAL specimens only), is one component of a comprehensive MRSA colonization surveillance program. It is not intended to diagnose MRSA infection nor to guide or monitor treatment for MRSA infections.   Culture, blood (Routine X 2) w Reflex to ID Panel     Status: None (Preliminary result)    Collection Time: 07/02/17 11:32 AM  Result Value Ref Range Status   Specimen Description BLOOD LEFT HAND  Final   Special Requests IN PEDIATRIC BOTTLE Blood Culture adequate volume  Final   Culture NO GROWTH 1 DAY  Final   Report Status PENDING  Incomplete  Culture, blood (Routine X 2) w Reflex to ID Panel     Status: None (Preliminary result)   Collection Time: 07/02/17 11:41 AM  Result Value Ref Range Status   Specimen Description BLOOD LEFT HAND  Final   Special Requests IN PEDIATRIC BOTTLE Blood Culture adequate volume  Final   Culture NO GROWTH 1 DAY  Final   Report Status PENDING  Incomplete   Pearson Grippe, DO IM PGY-1

## 2017-07-03 NOTE — Progress Notes (Signed)
PT Cancellation Note  Patient Details Name: Lai Hendriks MRN: [id number] DOB: [birth year]   Cancelled Treatment:    Reason Eval/Treat Not Completed: Other (comment). Pt sitting up in chair and requests PT return after lunch for treatment session (lunch not yet delivered). Will follow-up this afternoon as time allows.  Mabeline Caras, PT, DPT Acute Rehab Services  Pager: [place] 07/03/2017, 12:44 PM

## 2017-07-03 NOTE — Progress Notes (Signed)
Subjective No acute events. Doing well. Siting up in chair. Bladder symptoms have resovled since we changed out her foley. Tolerating liquids without n/v.   Objective: Vital signs in last 24 hours: Temp:  [97.6 F (36.4 C)-98.5 F (36.9 C)] 98.2 F (36.8 C) (10/19 0445) Pulse Rate:  [84-89] 89 (10/19 0445) Resp:  [18-20] 18 (10/19 0445) BP: (127-134)/(57-65) 127/57 (10/19 0445) SpO2:  [97 %-98 %] 97 % (10/19 0445) Last BM Date: 06/29/17  Intake/Output from previous day: 10/18 0701 - 10/19 0700 In: 1695 [P.O.:720; I.V.:925; IV Piggyback:50] Out: 1295 [Urine:1200; Drains:95] Intake/Output this shift: No intake/output data recorded.  Gen: NAD, comfortable CV: RRR Pulm: Normal work of breathing Abd: Soft, NT; stoma pink with gas in bag Ext: SCDs in place  Lab Results: CBC   Recent Labs  07/02/17 0359 07/03/17 0435  WBC 28.9* 19.5*  HGB 7.5* 8.5*  HCT 24.4* 28.0*  PLT 306 351   BMET  Recent Labs  07/02/17 0359 07/03/17 0435  NA 137 135  K 4.8 5.0  CL 108 105  CO2 22 19*  GLUCOSE 74 96  BUN 33* 26*  CREATININE 1.70* 1.57*  CALCIUM 7.7* 8.2*   PT/INR No results for input(s): LABPROT, INR in the last 72 hours. ABG No results for input(s): PHART, HCO3 in the last 72 hours.  Invalid input(s): PCO2, PO2  Studies/Results:  Anti-infectives: Anti-infectives    Start     Dose/Rate Route Frequency Ordered Stop   07/01/17 1030  fluconazole (DIFLUCAN) IVPB 400 mg     400 mg 100 mL/hr over 120 Minutes Intravenous Every 24 hours 06/30/17 0957     06/30/17 1830  piperacillin-tazobactam (ZOSYN) IVPB 3.375 g     3.375 g 12.5 mL/hr over 240 Minutes Intravenous Every 8 hours 06/30/17 1106     06/30/17 1115  piperacillin-tazobactam (ZOSYN) IVPB 3.375 g     3.375 g 100 mL/hr over 30 Minutes Intravenous  Once 06/30/17 1046 06/30/17 1412   06/30/17 1030  fluconazole (DIFLUCAN) IVPB 800 mg     800 mg 200 mL/hr over 120 Minutes Intravenous  Once 06/30/17 0957 06/30/17  1537   06/29/17 2130  ciprofloxacin (CIPRO) IVPB 400 mg  Status:  Discontinued     400 mg 200 mL/hr over 60 Minutes Intravenous Every 12 hours 06/29/17 1106 06/30/17 1046   06/29/17 1730  metroNIDAZOLE (FLAGYL) IVPB 500 mg  Status:  Discontinued     500 mg 100 mL/hr over 60 Minutes Intravenous Every 8 hours 06/29/17 1106 06/30/17 1046   06/29/17 1000  metroNIDAZOLE (FLAGYL) IVPB 500 mg  Status:  Discontinued     500 mg 100 mL/hr over 60 Minutes Intravenous Every 8 hours 06/29/17 0915 06/29/17 1117   06/29/17 1000  ciprofloxacin (CIPRO) IVPB 400 mg  Status:  Discontinued     400 mg 200 mL/hr over 60 Minutes Intravenous Every 12 hours 06/29/17 0915 06/29/17 1117       Assessment/Plan: Patient Active Problem List   Diagnosis Date Noted  . Diverticulitis of colon with perforation 06/29/2017  . Diverticulitis 06/29/2017  . Erythema nodosum 01/31/2016  . Hypothyroidism 01/31/2016  . Adenopathy   . Wound infection   . Cellulitis of both lower extremities 01/28/2016  . Lymphedema of both lower extremities 01/28/2016  . Increased anion gap metabolic acidosis 29/92/4268  . Allergic angioedema 06/02/2014  . Ulcer of left lower leg ([place]) 06/01/2014  . History of HIT (heparin-induced thrombocytopenia)  09/24/2013  . Thrombocytopenia, unspecified ([place]) 09/21/2013  . Anemia  09/21/2013  . Drug induced neutropenia(288.03) 09/21/2013  . HTN (hypertension) 08/21/2013  . OSA (obstructive sleep apnea) 12/25/2008   14F with hx of HTN and ?proliferative diabetic retinopathy - POD#4 s/p exlap, sigmoid colectomy, left salpingectomy, repair of 69mm cystotomy for acute gangrenous sigmoid diverticulitis  -19.5 from 29. Being treated by ID - blood cultures with polymicrobial growth + candida. On IV abx + antifungal per ID - day #4. TTE pending. Appreciate assistance -Continue foley for 2 weeks until CT cysto complete; drain to remain in place for that time as well -Cr 1.5 from 1.7 - monitor. Renal US -  medical disease, no hydronephrosis. -JP to bulb  -Advance to GI soft diet -Ambulate and out of bed to chair today - PT/OT -Continue wet to dry dressing to midline wound - WVac pending -WOCN seeing for stoma appliance -PPx: SCDs; PPI   LOS: 4 days   Sharon Mt. Dema Severin, M.D. General and Colorectal Surgery [facility]

## 2017-07-04 ENCOUNTER — Other Ambulatory Visit (HOSPITAL_COMMUNITY): Payer: BLUE CROSS/BLUE SHIELD

## 2017-07-04 LAB — CBC WITH DIFFERENTIAL/PLATELET
Basophils Absolute: 0 10*3/uL (ref 0.0–0.1)
Basophils Relative: 0 %
Eosinophils Absolute: 0.1 10*3/uL (ref 0.0–0.7)
Eosinophils Relative: 1 %
HCT: 23.8 % — ABNORMAL LOW (ref 36.0–46.0)
Hemoglobin: 7.4 g/dL — ABNORMAL LOW (ref 12.0–15.0)
Lymphocytes Relative: 11 %
Lymphs Abs: 1.5 10*3/uL (ref 0.7–4.0)
MCH: 25.2 pg — ABNORMAL LOW (ref 26.0–34.0)
MCHC: 31.1 g/dL (ref 30.0–36.0)
MCV: 81 fL (ref 78.0–100.0)
Monocytes Absolute: 0.8 10*3/uL (ref 0.1–1.0)
Monocytes Relative: 6 %
Neutro Abs: 11.7 10*3/uL — ABNORMAL HIGH (ref 1.7–7.7)
Neutrophils Relative %: 82 %
Platelets: 262 10*3/uL (ref 150–400)
RBC: 2.94 MIL/uL — ABNORMAL LOW (ref 3.87–5.11)
RDW: 17.7 % — ABNORMAL HIGH (ref 11.5–15.5)
WBC: 14.1 10*3/uL — ABNORMAL HIGH (ref 4.0–10.5)

## 2017-07-04 LAB — BASIC METABOLIC PANEL
Anion gap: 9 (ref 5–15)
BUN: 21 mg/dL — ABNORMAL HIGH (ref 6–20)
CO2: 21 mmol/L — ABNORMAL LOW (ref 22–32)
Calcium: 7.9 mg/dL — ABNORMAL LOW (ref 8.9–10.3)
Chloride: 105 mmol/L (ref 101–111)
Creatinine, Ser: 1.36 mg/dL — ABNORMAL HIGH (ref 0.44–1.00)
GFR calc Af Amer: 47 mL/min — ABNORMAL LOW (ref >60)
GFR calc non Af Amer: 40 mL/min — ABNORMAL LOW (ref >60)
Glucose, Bld: 65 mg/dL (ref 65–99)
Potassium: 4.5 mmol/L (ref 3.5–5.1)
Sodium: 135 mmol/L (ref 135–145)

## 2017-07-04 LAB — MAGNESIUM: Magnesium: 2 mg/dL (ref 1.7–2.4)

## 2017-07-04 LAB — PHOSPHORUS: Phosphorus: 3 mg/dL (ref 2.5–4.6)

## 2017-07-04 MED ORDER — PANTOPRAZOLE SODIUM 40 MG PO TBEC
40.0000 mg | DELAYED_RELEASE_TABLET | Freq: Every day | ORAL | Status: DC
  Administered 2017-07-04 – 2017-07-15 (×12): 40 mg via ORAL
  Filled 2017-07-04 (×12): qty 1

## 2017-07-04 MED ORDER — FLUCONAZOLE 100 MG PO TABS
400.0000 mg | ORAL_TABLET | Freq: Every day | ORAL | Status: AC
  Administered 2017-07-05 – 2017-07-15 (×11): 400 mg via ORAL
  Filled 2017-07-04 (×11): qty 4

## 2017-07-04 NOTE — Progress Notes (Signed)
Progress Note: General Surgery Service   Assessment/Plan: Patient Active Problem List   Diagnosis Date Noted  . Diverticulitis of colon with perforation 06/29/2017  . Diverticulitis 06/29/2017  . Erythema nodosum 01/31/2016  . Hypothyroidism 01/31/2016  . Adenopathy   . Wound infection   . Cellulitis of both lower extremities 01/28/2016  . Lymphedema of both lower extremities 01/28/2016  . Increased anion gap metabolic acidosis 42/59/5638  . Allergic angioedema 06/02/2014  . Ulcer of left lower leg ([place]) 06/01/2014  . History of HIT (heparin-induced thrombocytopenia)  09/24/2013  . Thrombocytopenia, unspecified ([place]) 09/21/2013  . Anemia 09/21/2013  . Drug induced neutropenia(288.03) 09/21/2013  . HTN (hypertension) 08/21/2013  . OSA (obstructive sleep apnea) 12/25/2008   s/p Procedure(s): EXPLORATORY LAPAROTOMY COLOSTOMY 06/29/2017 -tolerating diet, continue -ostomy with some air, minimal solid material -continue foley -JP to bulb -Ambulate and out of bed to chair today - PT/OT -vac placed on wound yesterday -WOCN seeing for stoma appliance -PPx: SCDs; PPI   LOS: 5 days  Chief Complaint/Subjective: Soreness with sleep, tolerating food, no nausea  Objective: Vital signs in last 24 hours: Temp:  [97.8 F (36.6 C)-98.7 F (37.1 C)] 97.8 F (36.6 C) (10/20 0552) Pulse Rate:  [87] 87 (10/20 0552) Resp:  [18-19] 19 (10/20 0552) BP: (136-147)/(62-68) 136/68 (10/20 0552) SpO2:  [97 %-99 %] 97 % (10/20 0552) Last BM Date: 07/03/17 (colostomy)  Intake/Output from previous day: 10/19 0701 - 10/20 0700 In: 850.8 [P.O.:240; I.V.:550.8; IV Piggyback:50] Out: 7564 [Urine:1700; Drains:30] Intake/Output this shift: No intake/output data recorded.  Lungs: CTAb  Cardiovascular: RRR  Abd: soft, ATTP, ostomy pink patent with gas in bag  Extremities: trace edema b/l  Neuro: AOx4  Lab Results: CBC   Recent Labs  07/03/17 0435 07/04/17 0410  WBC 19.5* 14.1*    HGB 8.5* 7.4*  HCT 28.0* 23.8*  PLT 351 262   BMET  Recent Labs  07/03/17 0435 07/04/17 0410  NA 135 135  K 5.0 4.5  CL 105 105  CO2 19* 21*  GLUCOSE 96 65  BUN 26* 21*  CREATININE 1.57* 1.36*  CALCIUM 8.2* 7.9*   PT/INR No results for input(s): LABPROT, INR in the last 72 hours. ABG No results for input(s): PHART, HCO3 in the last 72 hours.  Invalid input(s): PCO2, PO2  Studies/Results:  Anti-infectives: Anti-infectives    Start     Dose/Rate Route Frequency Ordered Stop   07/05/17 1000  fluconazole (DIFLUCAN) tablet 400 mg     400 mg Oral Daily 07/04/17 1110 07/16/17 0959   07/01/17 1030  fluconazole (DIFLUCAN) IVPB 400 mg  Status:  Discontinued     400 mg 100 mL/hr over 120 Minutes Intravenous Every 24 hours 06/30/17 0957 07/04/17 1110   06/30/17 1830  piperacillin-tazobactam (ZOSYN) IVPB 3.375 g     3.375 g 12.5 mL/hr over 240 Minutes Intravenous Every 8 hours 06/30/17 1106 07/15/17 2359   06/30/17 1115  piperacillin-tazobactam (ZOSYN) IVPB 3.375 g     3.375 g 100 mL/hr over 30 Minutes Intravenous  Once 06/30/17 1046 06/30/17 1412   06/30/17 1030  fluconazole (DIFLUCAN) IVPB 800 mg     800 mg 200 mL/hr over 120 Minutes Intravenous  Once 06/30/17 0957 06/30/17 1537   06/29/17 2130  ciprofloxacin (CIPRO) IVPB 400 mg  Status:  Discontinued     400 mg 200 mL/hr over 60 Minutes Intravenous Every 12 hours 06/29/17 1106 06/30/17 1046   06/29/17 1730  metroNIDAZOLE (FLAGYL) IVPB 500 mg  Status:  Discontinued  500 mg 100 mL/hr over 60 Minutes Intravenous Every 8 hours 06/29/17 1106 06/30/17 1046   06/29/17 1000  metroNIDAZOLE (FLAGYL) IVPB 500 mg  Status:  Discontinued     500 mg 100 mL/hr over 60 Minutes Intravenous Every 8 hours 06/29/17 0915 06/29/17 1117   06/29/17 1000  ciprofloxacin (CIPRO) IVPB 400 mg  Status:  Discontinued     400 mg 200 mL/hr over 60 Minutes Intravenous Every 12 hours 06/29/17 0915 06/29/17 1117      Medications: Scheduled  Meds: . Chlorhexidine Gluconate Cloth  6 each Topical Daily  . [START ON 07/05/2017] fluconazole  400 mg Oral Daily  . oxybutynin  5 mg Oral TID  . pantoprazole  40 mg Oral Q supper   Continuous Infusions: . 0.45 % NaCl with KCl 20 mEq / L 50 mL/hr at 07/04/17 0655  . sodium chloride    . piperacillin-tazobactam (ZOSYN)  IV Stopped (07/04/17 0901)   PRN Meds:.acetaminophen **OR** acetaminophen, diphenhydrAMINE **OR** diphenhydrAMINE, diphenhydrAMINE, hydrALAZINE, HYDROmorphone (DILAUDID) injection, ondansetron **OR** ondansetron (ZOFRAN) IV, oxyCODONE, sodium chloride flush, traMADol  Mickeal Skinner, MD Pg# [phone] [facility]

## 2017-07-04 NOTE — Progress Notes (Signed)
Pharmacy Antibiotic Note  Stop dates set (2 weeks) for Zosyn and fluconazole. Renal function improving and dosing is appropriate. Will use decision support to guide any updates with cultures. Pharmacy signing off. Please re-consult if needed.  Renold Genta, PharmD, BCPS Clinical Pharmacist Phone for today - [place] - [phone] 07/04/2017 11:16 AM

## 2017-07-05 LAB — CBC WITH DIFFERENTIAL/PLATELET
Basophils Absolute: 0 10*3/uL (ref 0.0–0.1)
Basophils Relative: 0 %
Eosinophils Absolute: 0.2 10*3/uL (ref 0.0–0.7)
Eosinophils Relative: 2 %
HCT: 24 % — ABNORMAL LOW (ref 36.0–46.0)
Hemoglobin: 7.4 g/dL — ABNORMAL LOW (ref 12.0–15.0)
Lymphocytes Relative: 14 %
Lymphs Abs: 1.7 10*3/uL (ref 0.7–4.0)
MCH: 24.8 pg — ABNORMAL LOW (ref 26.0–34.0)
MCHC: 30.8 g/dL (ref 30.0–36.0)
MCV: 80.5 fL (ref 78.0–100.0)
Monocytes Absolute: 0.7 10*3/uL (ref 0.1–1.0)
Monocytes Relative: 6 %
Neutro Abs: 9.5 10*3/uL — ABNORMAL HIGH (ref 1.7–7.7)
Neutrophils Relative %: 78 %
Platelets: 274 10*3/uL (ref 150–400)
RBC: 2.98 MIL/uL — ABNORMAL LOW (ref 3.87–5.11)
RDW: 17.2 % — ABNORMAL HIGH (ref 11.5–15.5)
WBC: 12.1 10*3/uL — ABNORMAL HIGH (ref 4.0–10.5)

## 2017-07-05 LAB — BASIC METABOLIC PANEL
Anion gap: 6 (ref 5–15)
BUN: 16 mg/dL (ref 6–20)
CO2: 22 mmol/L (ref 22–32)
Calcium: 8 mg/dL — ABNORMAL LOW (ref 8.9–10.3)
Chloride: 107 mmol/L (ref 101–111)
Creatinine, Ser: 1.36 mg/dL — ABNORMAL HIGH (ref 0.44–1.00)
GFR calc Af Amer: 47 mL/min — ABNORMAL LOW (ref >60)
GFR calc non Af Amer: 40 mL/min — ABNORMAL LOW (ref >60)
Glucose, Bld: 88 mg/dL (ref 65–99)
Potassium: 4.3 mmol/L (ref 3.5–5.1)
Sodium: 135 mmol/L (ref 135–145)

## 2017-07-05 LAB — PHOSPHORUS: Phosphorus: 3.8 mg/dL (ref 2.5–4.6)

## 2017-07-05 LAB — MAGNESIUM: Magnesium: 1.9 mg/dL (ref 1.7–2.4)

## 2017-07-05 NOTE — Progress Notes (Addendum)
6 Days Post-Op  Subjective: Alert.  Friendly.  No distress Tolerating soft diet without difficulty Passing flatus per ostomy but no stool Foley in place and to remain for 2 weeks  Remains on Zosyn and fluconazole.  Appreciate ID involvement  Objective: Vital signs in last 24 hours: Temp:  [98.5 F (36.9 C)] 98.5 F (36.9 C) (10/21 0519) Pulse Rate:  [79-84] 80 (10/21 0519) Resp:  [18-19] 18 (10/21 0519) BP: (124-136)/(62-66) 124/66 (10/21 0519) SpO2:  [98 %] 98 % (10/21 0519) Last BM Date: 06/28/17  Intake/Output from previous day: 10/20 0701 - 10/21 0700 In: 579 [I.V.:579] Out: 650 [Urine:550; Drains:90; Stool:10] Intake/Output this shift: No intake/output data recorded.  General appearance: Alert.  Cooperative.  Mental status normal.  No distress Resp: clear to auscultation bilaterally GI: Somewhat obese.  Soft.  Doesn't seem distended.  Ostomy pink and patent with gas in bag but no stool.  Negative pressure dressing on midline wound.  JP looks more serous now. Extremities: extremities normal, atraumatic, no cyanosis or edema Neurologic: Alert and oriented X 3, normal strength and tone. Normal symmetric reflexes. Normal coordination and gait  Lab Results:  Results for orders placed or performed during the hospital encounter of 06/29/17 (from the past 24 hour(s))  CBC with Differential/Platelet     Status: Abnormal   Collection Time: 07/05/17  4:23 AM  Result Value Ref Range   WBC 12.1 (H) 4.0 - 10.5 K/uL   RBC 2.98 (L) 3.87 - 5.11 MIL/uL   Hemoglobin 7.4 (L) 12.0 - 15.0 g/dL   HCT 24.0 (L) 36.0 - 46.0 %   MCV 80.5 78.0 - 100.0 fL   MCH 24.8 (L) 26.0 - 34.0 pg   MCHC 30.8 30.0 - 36.0 g/dL   RDW 17.2 (H) 11.5 - 15.5 %   Platelets 274 150 - 400 K/uL   Neutrophils Relative % 78 %   Neutro Abs 9.5 (H) 1.7 - 7.7 K/uL   Lymphocytes Relative 14 %   Lymphs Abs 1.7 0.7 - 4.0 K/uL   Monocytes Relative 6 %   Monocytes Absolute 0.7 0.1 - 1.0 K/uL   Eosinophils Relative 2 %    Eosinophils Absolute 0.2 0.0 - 0.7 K/uL   Basophils Relative 0 %   Basophils Absolute 0.0 0.0 - 0.1 K/uL  Magnesium     Status: None   Collection Time: 07/05/17  4:23 AM  Result Value Ref Range   Magnesium 1.9 1.7 - 2.4 mg/dL  Phosphorus     Status: None   Collection Time: 07/05/17  4:23 AM  Result Value Ref Range   Phosphorus 3.8 2.5 - 4.6 mg/dL  Basic metabolic panel     Status: Abnormal   Collection Time: 07/05/17  4:23 AM  Result Value Ref Range   Sodium 135 135 - 145 mmol/L   Potassium 4.3 3.5 - 5.1 mmol/L   Chloride 107 101 - 111 mmol/L   CO2 22 22 - 32 mmol/L   Glucose, Bld 88 65 - 99 mg/dL   BUN 16 6 - 20 mg/dL   Creatinine, Ser 1.36 (H) 0.44 - 1.00 mg/dL   Calcium 8.0 (L) 8.9 - 10.3 mg/dL   GFR calc non Af Amer 40 (L) >60 mL/min   GFR calc Af Amer 47 (L) >60 mL/min   Anion gap 6 5 - 15     Studies/Results: No results found.  . Chlorhexidine Gluconate Cloth  6 each Topical Daily  . fluconazole  400 mg Oral Daily  .  oxybutynin  5 mg Oral TID  . pantoprazole  40 mg Oral Q supper     Assessment/Plan: s/p Procedure(s): EXPLORATORY LAPAROTOMY COLOSTOMY  POD#6 - laparotomy, sigmoid colectomy and end transverse colostomy, left salpingectomy, repair of cystotomy Continues to improve She will be able to go home once bowel function through ostomy normalizes and we can convert to oral antibiotics Foley will remain in for a total of 14 days Continue soft diet Continue JP drain for now.  Probably discontinue prior to discharge Continue wound VAC. Continue PT and OT Continue ostomy nurse specialist assessment an outpatient ostomy education   Patient Active Problem List   Diagnosis Date Noted  . Diverticulitis of colon with perforation 06/29/2017  . Diverticulitis 06/29/2017  . Erythema nodosum 01/31/2016  . Hypothyroidism 01/31/2016  . Adenopathy   . Wound infection   . Cellulitis of both lower extremities 01/28/2016  . Lymphedema of both lower  extremities 01/28/2016  . Increased anion gap metabolic acidosis 29/92/4268  . Allergic angioedema 06/02/2014  . Ulcer of left lower leg ([place]) 06/01/2014  . History of HIT (heparin-induced thrombocytopenia)  09/24/2013  . Thrombocytopenia, unspecified ([place]) 09/21/2013  . Anemia 09/21/2013  . Drug induced neutropenia(288.03) 09/21/2013  . HTN (hypertension) 08/21/2013  . OSA (obstructive sleep apnea) 12/25/2008     @PROBHOSP @  LOS: 6 days    Alessio Bogan M 07/05/2017  . .prob

## 2017-07-06 ENCOUNTER — Other Ambulatory Visit (HOSPITAL_COMMUNITY): Payer: BLUE CROSS/BLUE SHIELD

## 2017-07-06 LAB — BASIC METABOLIC PANEL
Anion gap: 5 (ref 5–15)
BUN: 13 mg/dL (ref 6–20)
CO2: 22 mmol/L (ref 22–32)
Calcium: 8 mg/dL — ABNORMAL LOW (ref 8.9–10.3)
Chloride: 106 mmol/L (ref 101–111)
Creatinine, Ser: 1.35 mg/dL — ABNORMAL HIGH (ref 0.44–1.00)
GFR calc Af Amer: 47 mL/min — ABNORMAL LOW (ref >60)
GFR calc non Af Amer: 41 mL/min — ABNORMAL LOW (ref >60)
Glucose, Bld: 94 mg/dL (ref 65–99)
Potassium: 4.2 mmol/L (ref 3.5–5.1)
Sodium: 133 mmol/L — ABNORMAL LOW (ref 135–145)

## 2017-07-06 LAB — CBC WITH DIFFERENTIAL/PLATELET
Basophils Absolute: 0 10*3/uL (ref 0.0–0.1)
Basophils Relative: 0 %
Eosinophils Absolute: 0.3 10*3/uL (ref 0.0–0.7)
Eosinophils Relative: 2 %
HCT: 24 % — ABNORMAL LOW (ref 36.0–46.0)
Hemoglobin: 7.4 g/dL — ABNORMAL LOW (ref 12.0–15.0)
Lymphocytes Relative: 17 %
Lymphs Abs: 1.9 10*3/uL (ref 0.7–4.0)
MCH: 24.9 pg — ABNORMAL LOW (ref 26.0–34.0)
MCHC: 30.8 g/dL (ref 30.0–36.0)
MCV: 80.8 fL (ref 78.0–100.0)
Monocytes Absolute: 0.7 10*3/uL (ref 0.1–1.0)
Monocytes Relative: 6 %
Neutro Abs: 8.4 10*3/uL — ABNORMAL HIGH (ref 1.7–7.7)
Neutrophils Relative %: 75 %
Platelets: 301 10*3/uL (ref 150–400)
RBC: 2.97 MIL/uL — ABNORMAL LOW (ref 3.87–5.11)
RDW: 18.4 % — ABNORMAL HIGH (ref 11.5–15.5)
WBC: 11.3 10*3/uL — ABNORMAL HIGH (ref 4.0–10.5)

## 2017-07-06 LAB — MAGNESIUM: Magnesium: 1.8 mg/dL (ref 1.7–2.4)

## 2017-07-06 LAB — PHOSPHORUS: Phosphorus: 3.9 mg/dL (ref 2.5–4.6)

## 2017-07-06 MED ORDER — BOOST / RESOURCE BREEZE PO LIQD
1.0000 | Freq: Three times a day (TID) | ORAL | Status: DC
  Administered 2017-07-06 – 2017-07-10 (×6): 1 via ORAL

## 2017-07-06 MED ORDER — POLYETHYLENE GLYCOL 3350 17 G PO PACK
17.0000 g | PACK | Freq: Two times a day (BID) | ORAL | Status: DC
  Administered 2017-07-06 – 2017-07-16 (×19): 17 g via ORAL
  Filled 2017-07-06 (×19): qty 1

## 2017-07-06 MED ORDER — MAGNESIUM HYDROXIDE 400 MG/5ML PO SUSP
15.0000 mL | Freq: Once | ORAL | Status: AC
  Administered 2017-07-06: 15 mL via ORAL
  Filled 2017-07-06: qty 30

## 2017-07-06 MED ORDER — SODIUM CHLORIDE 0.9% FLUSH
10.0000 mL | INTRAVENOUS | Status: DC | PRN
  Administered 2017-07-08 – 2017-07-10 (×2): 10 mL
  Filled 2017-07-06 (×2): qty 40

## 2017-07-06 MED ORDER — VALACYCLOVIR HCL 500 MG PO TABS
2000.0000 mg | ORAL_TABLET | Freq: Two times a day (BID) | ORAL | Status: AC
  Administered 2017-07-06 (×2): 2000 mg via ORAL
  Filled 2017-07-06 (×2): qty 4

## 2017-07-06 NOTE — Progress Notes (Signed)
[facility]  New patient for [facility] this hospital admission.  [facility] will provide [facility] and [facility] services for Ms. Lindsay [name] for home IV ABX at DC.  [facility] team will follow pt until DC to support home care needs.  [facility] hospital infusion coordinator will provide in hospital teaching with pt/family to support independence at home with her IV ABX.  If patient discharges after hours, please call [phone].   Larry Sierras 07/06/2017, 8:53 PM

## 2017-07-06 NOTE — Consult Note (Addendum)
[place] Nurse wound follow up Surgical team following for assessment and plan of care to abd wound. Vac dressing change to wound Wound type: Full thickness post-op wound to midline abd Wound bed: Appearance unchanged since previous assessment; clean, red with yellow adipose tissue interspersed throughout Drainage (amount, consistency, odor) mod amt yellow drainage, no odor Periwound: intact skin surrounding Dressing procedure/placement/frequency: Applied one piece of black foam to 113mm cont suction.  Pt medicated for pain prior to the procedure and tolerated with mod amt discomfort. WOC will plan to change dressing Q M/W/F since ostomy pouch is located next to the wound and both dressing and pouch will need to be changed each time by home health nurses after discharge.  [place] Nurse ostomy follow-up consultnote  Stoma type/location: Colostomy stoma from surgery on 10/15. Stomal assessment/size: Stoma red and viable, flush with skin level, 1 inch Peristomal assessment: intact skin surrounding Output: Scant amt brown liquid, no flatusor stool Ostomy pouching: 1pc.  Education provided: Demonstrated pouch change and patient watched the procedure but did not look at the stoma. Applied barrier ring and one piece flexible convex pouch. Patient asked appropriate questions. She was able to open and close the velcro to empty. Supplies ordered to room for bedside nurse use. Reviewed pouching routines and ordering supplies.  Educational materials left at the bedside. WOC will continue to follow for further teaching sessions. Enrolled patient in [place] program: Yes Julien Girt MSN, RN, [place], [place], [place]

## 2017-07-06 NOTE — Progress Notes (Addendum)
Subjective No acute events. Foley removed, being replaced. Tolerating bites of food but nothing reliably yet. Denies n/v. Ambulating  Objective: Vital signs in last 24 hours: Temp:  [98.1 F (36.7 C)-98.3 F (36.8 C)] 98.1 F (36.7 C) (10/22 0510) Pulse Rate:  [80-88] 83 (10/22 0510) Resp:  [18] 18 (10/21 1521) BP: (134-144)/(69-74) 134/69 (10/22 0510) SpO2:  [99 %-100 %] 99 % (10/22 0510) Weight:  [98.6 kg (217 lb 6.4 oz)] 98.6 kg (217 lb 6.4 oz) (10/22 0510) Last BM Date: 06/28/17  Intake/Output from previous day: 10/21 0701 - 10/22 0700 In: 1033.3 [I.V.:833.3; IV Piggyback:200] Out: 525 [Urine:500; Drains:25] Intake/Output this shift: No intake/output data recorded.  JP: 25, serous UOP: 500cc/12hrs;  Gen: NAD, comfortable CV: RRR Pulm: Normal work of breathing Abd: Soft, NT; stoma pink, liquid in bag, small amoutn of stool Ext: SCDs in place  Lab Results: CBC   Recent Labs  07/05/17 0423 07/06/17 0404  WBC 12.1* 11.3*  HGB 7.4* 7.4*  HCT 24.0* 24.0*  PLT 274 301   BMET  Recent Labs  07/05/17 0423 07/06/17 0404  NA 135 133*  K 4.3 4.2  CL 107 106  CO2 22 22  GLUCOSE 88 94  BUN 16 13  CREATININE 1.36* 1.35*  CALCIUM 8.0* 8.0*   Anti-infectives: Anti-infectives    Start     Dose/Rate Route Frequency Ordered Stop   07/05/17 1000  fluconazole (DIFLUCAN) tablet 400 mg     400 mg Oral Daily 07/04/17 1110 07/16/17 0959   07/01/17 1030  fluconazole (DIFLUCAN) IVPB 400 mg  Status:  Discontinued     400 mg 100 mL/hr over 120 Minutes Intravenous Every 24 hours 06/30/17 0957 07/04/17 1110   06/30/17 1830  piperacillin-tazobactam (ZOSYN) IVPB 3.375 g     3.375 g 12.5 mL/hr over 240 Minutes Intravenous Every 8 hours 06/30/17 1106 07/15/17 2359   06/30/17 1115  piperacillin-tazobactam (ZOSYN) IVPB 3.375 g     3.375 g 100 mL/hr over 30 Minutes Intravenous  Once 06/30/17 1046 06/30/17 1412   06/30/17 1030  fluconazole (DIFLUCAN) IVPB 800 mg     800 mg 200  mL/hr over 120 Minutes Intravenous  Once 06/30/17 0957 06/30/17 1537   06/29/17 2130  ciprofloxacin (CIPRO) IVPB 400 mg  Status:  Discontinued     400 mg 200 mL/hr over 60 Minutes Intravenous Every 12 hours 06/29/17 1106 06/30/17 1046   06/29/17 1730  metroNIDAZOLE (FLAGYL) IVPB 500 mg  Status:  Discontinued     500 mg 100 mL/hr over 60 Minutes Intravenous Every 8 hours 06/29/17 1106 06/30/17 1046   06/29/17 1000  metroNIDAZOLE (FLAGYL) IVPB 500 mg  Status:  Discontinued     500 mg 100 mL/hr over 60 Minutes Intravenous Every 8 hours 06/29/17 0915 06/29/17 1117   06/29/17 1000  ciprofloxacin (CIPRO) IVPB 400 mg  Status:  Discontinued     400 mg 200 mL/hr over 60 Minutes Intravenous Every 12 hours 06/29/17 0915 06/29/17 1117       Assessment/Plan: Patient Active Problem List   Diagnosis Date Noted  . Diverticulitis of colon with perforation 06/29/2017  . Diverticulitis 06/29/2017  . Erythema nodosum 01/31/2016  . Hypothyroidism 01/31/2016  . Adenopathy   . Wound infection   . Cellulitis of both lower extremities 01/28/2016  . Lymphedema of both lower extremities 01/28/2016  . Increased anion gap metabolic acidosis 82/95/6213  . Allergic angioedema 06/02/2014  . Ulcer of left lower leg ([place]) 06/01/2014  . History of HIT (heparin-induced  thrombocytopenia)  09/24/2013  . Thrombocytopenia, unspecified ([employer]) 09/21/2013  . Anemia 09/21/2013  . Drug induced neutropenia(288.03) 09/21/2013  . HTN (hypertension) 08/21/2013  . OSA (obstructive sleep apnea) 12/25/2008  POD#7 - laparotomy, sigmoid colectomy and end transverse colostomy, left salpingectomy, repair of cystotomy Continues to improve She will be able to go home once bowel function through ostomy normalizes and we can convert to oral antibiotics Foley will remain in for a total of 14 days Milk of mag today; start miralax BID ID following for abx plan - appreciate assistance Continue to encourage PO intake - soft diet Continue  PT/OT Continue stoma RN for outpatient planning and education Home health for [place] and possibly home IV abx - plans pending from ID PPx: SCDs, PPI  LOS: 7 days   Sharon Mt. Dema Severin, M.D. General and Colorectal Surgery [facility]

## 2017-07-06 NOTE — Progress Notes (Signed)
Occupational Therapy Treatment Patient Details Name: Lindsay [name] MRN: [id number] DOB: 08-May-1951 Today's Date: 07/06/2017    History of present illness Pt is a 66 y.o. female admitted with perforated diverticulitis. She underwent laparotomy/sigmoid colectomy 06-29-17. Pertinent PMH includes lymphedema, HTN, cellulitis.    OT comments  Continues to be limited by abdominal pain, requiring max assist for LE bathing and dressing. Will educate in use of AE next visit.  Follow Up Recommendations  Home health OT    Equipment Recommendations  None recommended by OT (per pt, daughter is getter her a shower seat)    Recommendations for Other Services      Precautions / Restrictions Precautions Precautions: Fall;Other (comment) Precaution Comments: wound vac to abd, JP drain       Mobility Bed Mobility Overal bed mobility: Needs Assistance Bed Mobility: Rolling;Sidelying to Sit;Sit to Sidelying Rolling: Supervision Sidelying to sit: Min assist     Sit to sidelying: Mod assist General bed mobility comments: encouraged log roll technique to minimize abdominal pain., pt needing assist to raise trunk in sidelying and for LEs back into bed  Transfers Overall transfer level: Needs assistance Equipment used: Rolling walker (2 wheeled) Transfers: Sit to/from Stand Sit to Stand: Min guard Stand pivot transfers: Min guard       General transfer comment: pt with good technique, increased time    Balance Overall balance assessment: Needs assistance   Sitting balance-Leahy Scale: Good       Standing balance-Leahy Scale: Poor Standing balance comment: one hand support required to balance in standing                           ADL either performed or assessed with clinical judgement   ADL Overall ADL's : Needs assistance/impaired Eating/Feeding: Independent;Bed level                   Lower Body Dressing: Maximal assistance;Sitting/lateral leans Lower Body  Dressing Details (indicate cue type and reason): abdominal pain continues to limit pt's ability to access her feet Toilet Transfer: Min guard;Stand-pivot;RW;BSC   Toileting- Clothing Manipulation and Hygiene: Minimal assistance;Sit to/from stand         General ADL Comments: limited by abdominal discomfort      Vision       Perception     Praxis      Cognition Arousal/Alertness: Awake/alert Behavior During Therapy: WFL for tasks assessed/performed Overall Cognitive Status: Within Functional Limits for tasks assessed                                          Exercises     Shoulder Instructions       General Comments      Pertinent Vitals/ Pain       Pain Assessment: Faces Faces Pain Scale: Hurts little more Pain Location: abdomen Pain Descriptors / Indicators: Pressure Pain Intervention(s): Monitored during session  Home Living                                          Prior Functioning/Environment              Frequency  Min 2X/week        Progress Toward Goals  OT Goals(current goals can now  be found in the care plan section)  Progress towards OT goals: Progressing toward goals  Acute Rehab OT Goals Patient Stated Goal: Return home OT Goal Formulation: With patient/family Time For Goal Achievement: 07/14/17 Potential to Achieve Goals: Good  Plan Discharge plan remains appropriate    Co-evaluation                 AM-PAC PT "6 Clicks" Daily Activity     Outcome Measure   Help from another person eating meals?: None Help from another person taking care of personal grooming?: A Little Help from another person toileting, which includes using toliet, bedpan, or urinal?: A Little Help from another person bathing (including washing, rinsing, drying)?: A Lot Help from another person to put on and taking off regular upper body clothing?: None Help from another person to put on and taking off regular lower  body clothing?: A Lot 6 Click Score: 18    End of Session Equipment Utilized During Treatment: Rolling walker  OT Visit Diagnosis: Unsteadiness on feet (R26.81);Pain   Activity Tolerance Patient limited by pain   Patient Left in bed;with call bell/phone within reach;with nursing/sitter in room   Nurse Communication Other (comment) (pt to have central line removed)        Time: 7622-6333 OT Time Calculation (min): 14 min  Charges: OT General Charges $OT Visit: 1 Visit OT Treatments $Self Care/Home Management : 8-22 mins  07/06/2017 Nestor Lewandowsky, OTR/L Pager: [phone]   Werner Lean, Haze Boyden 07/06/2017, 3:28 PM

## 2017-07-06 NOTE — Progress Notes (Addendum)
Physical Therapy Treatment Patient Details Name: Lindsay [name] MRN: [id number] DOB: 01/06/1951 Today's Date: 07/06/2017    History of Present Illness Pt is a 66 y.o. female admitted with perforated diverticulitis. She underwent laparotomy/sigmoid colectomy 06-29-17. Pertinent PMH includes lymphedema, HTN, cellulitis.     PT Comments    Pt with improved transfer ability and ambulation tolerance compared to last week. Wound Vac noted to be broken and not working, Therapist, sports notified and new VAC ordered. Unsure how long the vac wasn't working. Anticipate with assist of family to complete household chores pt will be able to d/c home with use of RW once medically stable.   Follow Up Recommendations  Home health PT;Supervision/Assistance - 24 hour (may progress well enough and not need HHPT)     Equipment Recommendations  None recommended by PT    Recommendations for Other Services       Precautions / Restrictions Precautions Precautions: Fall;Other (comment) Precaution Comments: wound vac to abd, JP drain Restrictions Weight Bearing Restrictions: No    Mobility  Bed Mobility Overal bed mobility: Needs Assistance Bed Mobility: Rolling;Sidelying to Sit Rolling: Supervision Sidelying to sit: Min assist       General bed mobility comments: educated on how to transfer out of a flat bed incase pt's dtr can't get a chair for her. Pt used bed rail to pull self over to sidelying, able to manage LEs without assist. pt did pull up on PTs waist to sit EOB  Transfers Overall transfer level: Needs assistance Equipment used: Rolling walker (2 wheeled) Transfers: Sit to/from Stand Sit to Stand: Min guard         General transfer comment: pt with good technique, increased time  Ambulation/Gait Ambulation/Gait assistance: Supervision Ambulation Distance (Feet): 165 Feet (x2) Assistive device: Rolling walker (2 wheeled) Gait Pattern/deviations: Step-through pattern;Decreased stride  length;Trunk flexed Gait velocity: dec Gait velocity interpretation: Below normal speed for age/gender General Gait Details: pt had to use restroom, during ambulation. therefore completed to laps with restroom intermission. Pt with +SOB due to deconditioning due to hospital stay and invasive surgery.    Stairs            Wheelchair Mobility    Modified Rankin (Stroke Patients Only)       Balance Overall balance assessment: Needs assistance Sitting-balance support: No upper extremity supported;Feet supported       Standing balance support: Single extremity supported Standing balance-Leahy Scale: Poor Standing balance comment: pt able to perform hygiene s/p tolieting however had to hold onto rail. pt able to stand and wash hands at sink however leaned on counter                            Cognition Arousal/Alertness: Awake/alert Behavior During Therapy: WFL for tasks assessed/performed Overall Cognitive Status: Within Functional Limits for tasks assessed                                        Exercises      General Comments General comments (skin integrity, edema, etc.): VSS      Pertinent Vitals/Pain Pain Assessment: 0-10 Pain Score: 5  Pain Location: abdomen Pain Descriptors / Indicators: Pressure Pain Intervention(s): Monitored during session    Home Living                      Prior  Function            PT Goals (current goals can now be found in the care plan section) Progress towards PT goals: Progressing toward goals    Frequency    Min 3X/week      PT Plan Current plan remains appropriate    Co-evaluation              AM-PAC PT "6 Clicks" Daily Activity  Outcome Measure  Difficulty turning over in bed (including adjusting bedclothes, sheets and blankets)?: A Lot Difficulty moving from lying on back to sitting on the side of the bed? : A Lot Difficulty sitting down on and standing up from a  chair with arms (e.g., wheelchair, bedside commode, etc,.)?: A Little Help needed moving to and from a bed to chair (including a wheelchair)?: A Little Help needed walking in hospital room?: A Little Help needed climbing 3-5 steps with a railing? : A Lot 6 Click Score: 15    End of Session Equipment Utilized During Treatment: Gait belt Activity Tolerance: Patient limited by pain Patient left: in bed;with call bell/phone within reach;with family/visitor present Nurse Communication: Mobility status PT Visit Diagnosis: Difficulty in walking, not elsewhere classified (R26.2);Pain Pain - Right/Left:  (abdomen)     Time: 7948-0165 PT Time Calculation (min) (ACUTE ONLY): 29 min  Charges:  $Gait Training: 8-22 mins $Therapeutic Activity: 8-22 mins                    G Codes:       Kittie Plater, PT, DPT Pager #: [phone] Office #: [phone]    [place] 07/06/2017, 9:40 AM

## 2017-07-06 NOTE — Care Management Note (Signed)
Case Management Note  Patient Details  Name: Lindsay [name] MRN: [id number] Date of Birth: [birth year]  Subjective/Objective:                    Action/Plan:   Expected Discharge Date:  07/03/17               Expected Discharge Plan:  [place]  In-House Referral:  NA  Discharge planning Services  CM Consult  Post Acute Care Choice:  Home Health Choice offered to:  Adult Children, Patient  DME Arranged:  Vac, Tub bench DME Agency:  [facility]  HH Arranged:  RN, PT [facility] Agency:  [place]  Status of Service:  In process, will continue to follow  If discussed at Long Length of Stay Meetings, dates discussed:    Additional Comments:  Marilu Favre, RN 07/06/2017, 9:40 AM

## 2017-07-06 NOTE — Progress Notes (Signed)
Peripherally Inserted Central Catheter/Midline Placement  The IV Nurse has discussed with the patient and/or persons authorized to consent for the patient, the purpose of this procedure and the potential benefits and risks involved with this procedure.  The benefits include less needle sticks, lab draws from the catheter, and the patient may be discharged home with the catheter. Risks include, but not limited to, infection, bleeding, blood clot (thrombus formation), and puncture of an artery; nerve damage and irregular heartbeat and possibility to perform a PICC exchange if needed/ordered by physician.  Alternatives to this procedure were also discussed.  Bard Power PICC patient education guide, fact sheet on infection prevention and patient information card has been provided to patient /or left at bedside.    PICC/Midline Placement Documentation  PICC Single Lumen 07/06/17 PICC Right Brachial 40 cm 3 cm (Active)  Indication for Insertion or Continuance of Line Home intravenous therapies (PICC only) 07/06/2017  4:00 PM  Exposed Catheter (cm) 3 cm 07/06/2017  4:00 PM  Site Assessment Clean;Dry;Intact 07/06/2017  4:00 PM  Line Status Flushed;Saline locked;Blood return noted 07/06/2017  4:00 PM  Dressing Type Transparent 07/06/2017  4:00 PM  Dressing Status Clean;Dry;Intact;Antimicrobial disc in place 07/06/2017  4:00 PM  Dressing Change Due 07/13/17 07/06/2017  4:00 PM       Holley Bouche Renee 07/06/2017, 4:12 PM

## 2017-07-06 NOTE — Progress Notes (Addendum)
[facility]                   Date of Admission:  06/29/2017                                 Total days of antibiotics 8                                                                                     Day 7 Zosyn                                                                                     Day 8 Fluconazole                                                                                     Cipro/Flagyl 10/15-10/16                                                              Reason for Consult: Bacteremia / Fungemia                           Assessment:  Intraabdominal Abscesses with secondary polymicrobial (strep and ecoli) Bacteremia, Fungemia  Patient presented with LLQ pain fevers, and chills. CT showing Intraabdominal abscesses. Abscesses drained by surgery (10/15) and were found to have eroded into both the colon and left fallopian tube. Afebrile. WBC continuing to trend down 19.5 -> 14.1 -> 12.1 -> 11.3. Patient noted to have perioral vesicles, suspect HSV. - Abdominal Abscess Cultures (10/15) Strep Anginosis, E. Coli, B. Thetaiotaomicron  - Blood Cultures (10/15): Strep Anginosis, E. Coli - BCID 1 (10/15): Strep Species, Candida Tropicalis - BCID 2: (10/15): E. Coli - MRSA Nasal PCR: Negative - Repeat Blood Cultures (10/18): NG in 4d  Plan:  - Continue pip/tazo to cover strep and e. coli  - Fluconazole PO (C.tropicalis on BCID, but not grown in culture) - Repeat Blood Cultures drawn 10/18 have bene no growth today - R IJ Central Catheter discontinue today so that can have PICC line ordered for IV antibiotics outpatients - Patient to be treated for 2 week course with antibiotics and  antifungals with start date of 10/18 - Valtrex 2g BID for 2 doses   ------------------------------------------------ I have seen and examined patient with the resident and agree with the treatment plan as listed avoe Active Problems:   Diverticulitis  of colon with perforation   Diverticulitis  Scheduled Meds: . Chlorhexidine Gluconate Cloth  6 each Topical Daily  . feeding supplement  1 Container Oral TID BM  . fluconazole  400 mg Oral Daily  . oxybutynin  5 mg Oral TID  . pantoprazole  40 mg Oral Q supper  . polyethylene glycol  17 g Oral BID   Continuous Infusions: . 0.45 % NaCl with KCl 20 mEq / L 50 mL/hr at 07/06/17 0237  . sodium chloride    . piperacillin-tazobactam (ZOSYN)  IV 3.375 g (07/06/17 0502)    SUBJECTIVE: Patient resting in bed. Feeling okay today. Remains afebrile.   Review of Systems: Review of Systems  Constitutional: Negative.   HENT: Negative.   Respiratory: Negative.   Cardiovascular: Negative.   Gastrointestinal:       Minimal abdominal discomfort  Skin: Negative.     Allergies  Allergen Reactions  . Ancef [Cefazolin] Rash    Tolerates Zosyn  . Primaxin [Imipenem] Swelling    Developed lip swelling post administration of Primaxin even though she had tolerated this in the past.  . Heparin     (HIT antibody positive; SRA negative  . Vancomycin Rash    OBJECTIVE: Vitals:   07/05/17 0519 07/05/17 1521 07/05/17 2049 07/06/17 0510  BP:  (!) 144/74 139/71 134/69  Pulse: 80 80 88 83  Resp: 18 18    Temp: 98.5 F (36.9 C) 98.3 F (36.8 C) 98.2 F (36.8 C) 98.1 F (36.7 C)  TempSrc: Oral Oral Oral Oral  SpO2: 98% 100% 100% 99%  Weight:    217 lb 6.4 oz (98.6 kg)  Height:       Body mass index is 38.51 kg/m. + scattered small vesicles to lower lip Physical Exam  Constitutional: She is oriented to person, place, and time and well-developed, well-nourished, and in no distress.  HENT:  Head: Normocephalic and atraumatic.  Eyes: Conjunctivae and EOM are normal.  Neck:  R IJ double lumen catheter in place  Cardiovascular: Normal rate, regular rhythm and normal heart sounds.   Pulmonary/Chest: Effort normal and breath sounds normal. No respiratory distress.  Abdominal: Soft. She  exhibits no distension.  Wound vac in place JP drain with minimal sanguinous fluid   Musculoskeletal: She exhibits no edema.  Neurological: She is alert and oriented to person, place, and time.  Skin: Skin is warm and dry.  Scaring from healed ulcers bilateral lower exrtremities    Lab Results Lab Results  Component Value Date   WBC 11.3 (H) 07/06/2017   HGB 7.4 (L) 07/06/2017   HCT 24.0 (L) 07/06/2017   MCV 80.8 07/06/2017   PLT 301 07/06/2017    Lab Results  Component Value Date   CREATININE 1.35 (H) 07/06/2017   BUN 13 07/06/2017   NA 133 (L) 07/06/2017   K 4.2 07/06/2017   CL 106 07/06/2017   CO2 22 07/06/2017    Lab Results  Component Value Date   ALT 26 07/01/2017   AST 20 07/01/2017   ALKPHOS 92 07/01/2017   BILITOT 1.1 07/01/2017     Microbiology: Recent Results (from the past 240 hour(s))  Blood Culture (routine x 2)     Status: Abnormal   Collection Time: 06/29/17  8:30 AM  Result Value Ref Range Status   Specimen Description BLOOD LEFT HAND  Final   Special Requests IN PEDIATRIC BOTTLE Blood Culture adequate volume  Final   Culture  Setup Time   Final    GRAM POSITIVE COCCI IN CHAINS IN PEDIATRIC BOTTLE CRITICAL RESULT CALLED TO, READ BACK BY AND VERIFIED WITH: M. Bufford Spikes.Ph. 9:45 06/30/17 ([email or web address])    Culture STREPTOCOCCUS ANGINOSIS (A)  Final   Report Status 07/02/2017 FINAL  Final   Organism ID, Bacteria STREPTOCOCCUS ANGINOSIS  Final      Susceptibility   Streptococcus anginosis - MIC*    PENICILLIN <=0.06 SENSITIVE Sensitive     CEFTRIAXONE 0.25 SENSITIVE Sensitive     ERYTHROMYCIN <=0.12 SENSITIVE Sensitive     LEVOFLOXACIN 0.5 SENSITIVE Sensitive     VANCOMYCIN 1 SENSITIVE Sensitive     * STREPTOCOCCUS ANGINOSIS  Blood Culture ID Panel (Reflexed)     Status: Abnormal   Collection Time: 06/29/17  8:30 AM  Result Value Ref Range Status   Enterococcus species NOT DETECTED NOT DETECTED Final   Listeria monocytogenes NOT DETECTED NOT DETECTED  Final   Staphylococcus species NOT DETECTED NOT DETECTED Final   Staphylococcus aureus NOT DETECTED NOT DETECTED Final   Streptococcus species DETECTED (A) NOT DETECTED Final    Comment: Not Enterococcus species, Streptococcus agalactiae, Streptococcus pyogenes, or Streptococcus pneumoniae. CRITICAL RESULT CALLED TO, READ BACK BY AND VERIFIED WITH: M. Bufford Spikes.Ph. 9:45 06/30/17 ([email or web address])    Streptococcus agalactiae NOT DETECTED NOT DETECTED Final   Streptococcus pneumoniae NOT DETECTED NOT DETECTED Final   Streptococcus pyogenes NOT DETECTED NOT DETECTED Final   Acinetobacter baumannii NOT DETECTED NOT DETECTED Final   Enterobacteriaceae species NOT DETECTED NOT DETECTED Final   Enterobacter cloacae complex NOT DETECTED NOT DETECTED Final   Escherichia coli NOT DETECTED NOT DETECTED Final   Klebsiella oxytoca NOT DETECTED NOT DETECTED Final   Klebsiella pneumoniae NOT DETECTED NOT DETECTED Final   Proteus species NOT DETECTED NOT DETECTED Final   Serratia marcescens NOT DETECTED NOT DETECTED Final   Haemophilus influenzae NOT DETECTED NOT DETECTED Final   Neisseria meningitidis NOT DETECTED NOT DETECTED Final   Pseudomonas aeruginosa NOT DETECTED NOT DETECTED Final   Candida albicans NOT DETECTED NOT DETECTED Final   Candida glabrata NOT DETECTED NOT DETECTED Final   Candida krusei NOT DETECTED NOT DETECTED Final   Candida parapsilosis NOT DETECTED NOT DETECTED Final   Candida tropicalis DETECTED (A) NOT DETECTED Final    Comment: CRITICAL RESULT CALLED TO, READ BACK BY AND VERIFIED WITH: M. Bufford Spikes.Ph. 9:45 06/30/17 (wilsonm)   Blood Culture (routine x 2)     Status: Abnormal   Collection Time: 06/29/17  9:15 AM  Result Value Ref Range Status   Specimen Description BLOOD  Final   Special Requests IN PEDIATRIC BOTTLE Blood Culture adequate volume  Final   Culture  Setup Time   Final    GRAM NEGATIVE RODS IN PEDIATRIC BOTTLE CRITICAL RESULT CALLED TO, READ BACK BY AND VERIFIED WITH: M.  LI AT 1030 ON 06/30/17 BY C. JESSUP, MLT.    Culture ESCHERICHIA COLI (A)  Final   Report Status 07/02/2017 FINAL  Final   Organism ID, Bacteria ESCHERICHIA COLI  Final      Susceptibility   Escherichia coli - MIC*    AMPICILLIN >=32 RESISTANT Resistant     CEFAZOLIN <=4 SENSITIVE Sensitive     CEFEPIME <=1 SENSITIVE Sensitive     CEFTAZIDIME <=1 SENSITIVE Sensitive  CEFTRIAXONE <=1 SENSITIVE Sensitive     CIPROFLOXACIN >=4 RESISTANT Resistant     GENTAMICIN <=1 SENSITIVE Sensitive     IMIPENEM <=0.25 SENSITIVE Sensitive     TRIMETH/SULFA >=320 RESISTANT Resistant     AMPICILLIN/SULBACTAM >=32 RESISTANT Resistant     PIP/TAZO <=4 SENSITIVE Sensitive     Extended ESBL NEGATIVE Sensitive     * ESCHERICHIA COLI  Blood Culture ID Panel (Reflexed)     Status: Abnormal   Collection Time: 06/29/17  9:15 AM  Result Value Ref Range Status   Enterococcus species NOT DETECTED NOT DETECTED Final   Listeria monocytogenes NOT DETECTED NOT DETECTED Final   Staphylococcus species NOT DETECTED NOT DETECTED Final   Staphylococcus aureus NOT DETECTED NOT DETECTED Final   Streptococcus species NOT DETECTED NOT DETECTED Final   Streptococcus agalactiae NOT DETECTED NOT DETECTED Final   Streptococcus pneumoniae NOT DETECTED NOT DETECTED Final   Streptococcus pyogenes NOT DETECTED NOT DETECTED Final   Acinetobacter baumannii NOT DETECTED NOT DETECTED Final   Enterobacteriaceae species DETECTED (A) NOT DETECTED Final    Comment: Enterobacteriaceae represent a large family of gram-negative bacteria, not a single organism. CRITICAL RESULT CALLED TO, READ BACK BY AND VERIFIED WITH: M. LI, PHARMD AT 1030 ON 06/30/17 BY C. JESSUP, MLT.    Enterobacter cloacae complex NOT DETECTED NOT DETECTED Final   Escherichia coli DETECTED (A) NOT DETECTED Final    Comment: CRITICAL RESULT CALLED TO, READ BACK BY AND VERIFIED WITH: M. LI, PHARMD AT 1030 ON 06/30/17 BY C. JESSUP, MLT.    Klebsiella oxytoca NOT  DETECTED NOT DETECTED Final   Klebsiella pneumoniae NOT DETECTED NOT DETECTED Final   Proteus species NOT DETECTED NOT DETECTED Final   Serratia marcescens NOT DETECTED NOT DETECTED Final   Carbapenem resistance NOT DETECTED NOT DETECTED Final   Haemophilus influenzae NOT DETECTED NOT DETECTED Final   Neisseria meningitidis NOT DETECTED NOT DETECTED Final   Pseudomonas aeruginosa NOT DETECTED NOT DETECTED Final   Candida albicans NOT DETECTED NOT DETECTED Final   Candida glabrata NOT DETECTED NOT DETECTED Final   Candida krusei NOT DETECTED NOT DETECTED Final   Candida parapsilosis NOT DETECTED NOT DETECTED Final   Candida tropicalis NOT DETECTED NOT DETECTED Final  Aerobic/Anaerobic Culture (surgical/deep wound)     Status: None   Collection Time: 06/29/17  4:05 PM  Result Value Ref Range Status   Specimen Description ABSCESS ABDOMEN  Final   Special Requests NONE  Final   Gram Stain   Final    ABUNDANT WBC PRESENT,BOTH PMN AND MONONUCLEAR ABUNDANT GRAM POSITIVE COCCI IN PAIRS MODERATE GRAM NEGATIVE RODS    Culture   Final    FEW ESCHERICHIA COLI MODERATE STREPTOCOCCUS ANGINOSIS MIXED ANAEROBIC FLORA PRESENT.  CALL LAB IF FURTHER IID REQUIRED.    Report Status 07/03/2017 FINAL  Final   Organism ID, Bacteria ESCHERICHIA COLI  Final   Organism ID, Bacteria STREPTOCOCCUS ANGINOSIS  Final      Susceptibility   Escherichia coli - MIC*    AMPICILLIN >=32 RESISTANT Resistant     CEFAZOLIN <=4 SENSITIVE Sensitive     CEFEPIME <=1 SENSITIVE Sensitive     CEFTAZIDIME <=1 SENSITIVE Sensitive     CEFTRIAXONE <=1 SENSITIVE Sensitive     CIPROFLOXACIN >=4 RESISTANT Resistant     GENTAMICIN <=1 SENSITIVE Sensitive     IMIPENEM <=0.25 SENSITIVE Sensitive     TRIMETH/SULFA >=320 RESISTANT Resistant     AMPICILLIN/SULBACTAM 16 INTERMEDIATE Intermediate     PIP/TAZO <=  4 SENSITIVE Sensitive     Extended ESBL NEGATIVE Sensitive     * FEW ESCHERICHIA COLI   Streptococcus anginosis - MIC*     PENICILLIN <=0.06 SENSITIVE Sensitive     CEFTRIAXONE 0.25 SENSITIVE Sensitive     ERYTHROMYCIN <=0.12 SENSITIVE Sensitive     LEVOFLOXACIN 0.5 SENSITIVE Sensitive     VANCOMYCIN 1 SENSITIVE Sensitive     * MODERATE STREPTOCOCCUS ANGINOSIS  Aerobic/Anaerobic Culture (surgical/deep wound)     Status: None   Collection Time: 06/29/17  4:35 PM  Result Value Ref Range Status   Specimen Description TISSUE  Final   Special Requests PELVIC ABCESS CAVITY  Final   Gram Stain   Final    ABUNDANT WBC PRESENT,BOTH PMN AND MONONUCLEAR MODERATE GRAM POSITIVE COCCI IN PAIRS RARE GRAM NEGATIVE RODS    Culture   Final    FEW ESCHERICHIA COLI FEW STREPTOCOCCUS ANGINOSIS SUSCEPTIBILITIES PERFORMED ON PREVIOUS CULTURE WITHIN THE LAST 5 DAYS. FEW BACTEROIDES THETAIOTAOMICRON BETA LACTAMASE POSITIVE    Report Status 07/03/2017 FINAL  Final   Organism ID, Bacteria ESCHERICHIA COLI  Final      Susceptibility   Escherichia coli - MIC*    AMPICILLIN >=32 RESISTANT Resistant     CEFAZOLIN <=4 SENSITIVE Sensitive     CEFEPIME <=1 SENSITIVE Sensitive     CEFTAZIDIME <=1 SENSITIVE Sensitive     CEFTRIAXONE <=1 SENSITIVE Sensitive     CIPROFLOXACIN >=4 RESISTANT Resistant     GENTAMICIN <=1 SENSITIVE Sensitive     IMIPENEM <=0.25 SENSITIVE Sensitive     TRIMETH/SULFA >=320 RESISTANT Resistant     AMPICILLIN/SULBACTAM >=32 RESISTANT Resistant     PIP/TAZO <=4 SENSITIVE Sensitive     Extended ESBL NEGATIVE Sensitive     * FEW ESCHERICHIA COLI  MRSA PCR Screening     Status: None   Collection Time: 06/29/17 11:33 PM  Result Value Ref Range Status   MRSA by PCR NEGATIVE NEGATIVE Final    Comment:        The GeneXpert MRSA Assay (FDA approved for NASAL specimens only), is one component of a comprehensive MRSA colonization surveillance program. It is not intended to diagnose MRSA infection nor to guide or monitor treatment for MRSA infections.   Culture, blood (Routine X 2) w Reflex to ID Panel      Status: None (Preliminary result)   Collection Time: 07/02/17 11:32 AM  Result Value Ref Range Status   Specimen Description BLOOD LEFT HAND  Final   Special Requests IN PEDIATRIC BOTTLE Blood Culture adequate volume  Final   Culture NO GROWTH 4 DAYS  Final   Report Status PENDING  Incomplete  Culture, blood (Routine X 2) w Reflex to ID Panel     Status: None (Preliminary result)   Collection Time: 07/02/17 11:41 AM  Result Value Ref Range Status   Specimen Description BLOOD LEFT HAND  Final   Special Requests IN PEDIATRIC BOTTLE Blood Culture adequate volume  Final   Culture NO GROWTH 4 DAYS  Final   Report Status PENDING  Incomplete   Pearson Grippe, DO IM PGY-1

## 2017-07-06 NOTE — Procedures (Signed)
Attempted echo 10/18, but patient was unable to tolerate exam due to extreme pain.  Re-attempted 10/2 but patient getting pic line placed.

## 2017-07-06 NOTE — Progress Notes (Signed)
Changed wound vac cannister out ending volume was 250 ml.

## 2017-07-07 ENCOUNTER — Inpatient Hospital Stay (HOSPITAL_COMMUNITY): Payer: BLUE CROSS/BLUE SHIELD

## 2017-07-07 DIAGNOSIS — I351 Nonrheumatic aortic (valve) insufficiency: Secondary | ICD-10-CM

## 2017-07-07 LAB — BASIC METABOLIC PANEL
Anion gap: 8 (ref 5–15)
BUN: 7 mg/dL (ref 6–20)
CO2: 23 mmol/L (ref 22–32)
Calcium: 8.2 mg/dL — ABNORMAL LOW (ref 8.9–10.3)
Chloride: 103 mmol/L (ref 101–111)
Creatinine, Ser: 1.25 mg/dL — ABNORMAL HIGH (ref 0.44–1.00)
GFR calc Af Amer: 52 mL/min — ABNORMAL LOW (ref >60)
GFR calc non Af Amer: 44 mL/min — ABNORMAL LOW (ref >60)
Glucose, Bld: 106 mg/dL — ABNORMAL HIGH (ref 65–99)
Potassium: 4 mmol/L (ref 3.5–5.1)
Sodium: 134 mmol/L — ABNORMAL LOW (ref 135–145)

## 2017-07-07 LAB — CBC WITH DIFFERENTIAL/PLATELET
Basophils Absolute: 0 10*3/uL (ref 0.0–0.1)
Basophils Relative: 0 %
Eosinophils Absolute: 0.3 10*3/uL (ref 0.0–0.7)
Eosinophils Relative: 3 %
HCT: 24 % — ABNORMAL LOW (ref 36.0–46.0)
Hemoglobin: 7.6 g/dL — ABNORMAL LOW (ref 12.0–15.0)
Lymphocytes Relative: 19 %
Lymphs Abs: 2 10*3/uL (ref 0.7–4.0)
MCH: 25.3 pg — ABNORMAL LOW (ref 26.0–34.0)
MCHC: 31.7 g/dL (ref 30.0–36.0)
MCV: 80 fL (ref 78.0–100.0)
Monocytes Absolute: 0.4 10*3/uL (ref 0.1–1.0)
Monocytes Relative: 4 %
Neutro Abs: 7.9 10*3/uL — ABNORMAL HIGH (ref 1.7–7.7)
Neutrophils Relative %: 74 %
Platelets: 305 10*3/uL (ref 150–400)
RBC: 3 MIL/uL — ABNORMAL LOW (ref 3.87–5.11)
RDW: 18.2 % — ABNORMAL HIGH (ref 11.5–15.5)
WBC: 10.7 10*3/uL — ABNORMAL HIGH (ref 4.0–10.5)

## 2017-07-07 LAB — CULTURE, BLOOD (ROUTINE X 2)
Culture: NO GROWTH
Culture: NO GROWTH
Special Requests: ADEQUATE
Special Requests: ADEQUATE

## 2017-07-07 LAB — MAGNESIUM: Magnesium: 1.8 mg/dL (ref 1.7–2.4)

## 2017-07-07 LAB — PHOSPHORUS: Phosphorus: 3.8 mg/dL (ref 2.5–4.6)

## 2017-07-07 MED ORDER — MAGNESIUM SULFATE 2 GM/50ML IV SOLN
2.0000 g | Freq: Once | INTRAVENOUS | Status: AC
  Administered 2017-07-07: 2 g via INTRAVENOUS
  Filled 2017-07-07: qty 50

## 2017-07-07 MED ORDER — FLEET ENEMA 7-19 GM/118ML RE ENEM
0.5000 | ENEMA | Freq: Once | RECTAL | Status: AC
  Administered 2017-07-07: 0.5 via RECTAL

## 2017-07-07 NOTE — Progress Notes (Addendum)
Subjective No acute events. Feeling well, awaiting stoma function. Ambulating with PT/OT. Denies n/v; eating ~1/4 of her trays. Got milk of mag yesterday.  Objective: Vital signs in last 24 hours: Temp:  [98 F (36.7 C)-99.1 F (37.3 C)] 98 F (36.7 C) (10/23 0420) Pulse Rate:  [88-93] 93 (10/23 0420) Resp:  [16] 16 (10/22 2046) BP: (126-147)/(69-75) 126/75 (10/23 0420) SpO2:  [97 %-98 %] 98 % (10/23 0420) Last BM Date: 07/06/17  Intake/Output from previous day: 10/22 0701 - 10/23 0700 In: 1535.8 [P.O.:570; I.V.:815.8; IV Piggyback:150] Out: 2460 [Urine:2450; Drains:10] Intake/Output this shift: No intake/output data recorded.  Gen: NAD, comfortable CV: RRR Pulm: Normal work of breathing Abd: Soft, mildly distended, nontender; stoma pink; patent. JP output serous Ext: SCDs in place  Lab Results: CBC   Recent Labs  07/06/17 0404 07/07/17 0509  WBC 11.3* 10.7*  HGB 7.4* 7.6*  HCT 24.0* 24.0*  PLT 301 305   BMET  Recent Labs  07/06/17 0404 07/07/17 0509  NA 133* 134*  K 4.2 4.0  CL 106 103  CO2 22 23  GLUCOSE 94 106*  BUN 13 7  CREATININE 1.35* 1.25*  CALCIUM 8.0* 8.2*   Anti-infectives: Anti-infectives    Start     Dose/Rate Route Frequency Ordered Stop   07/06/17 1130  valACYclovir (VALTREX) tablet 2,000 mg     2,000 mg Oral 2 times daily 07/06/17 1129 07/06/17 2214   07/05/17 1000  fluconazole (DIFLUCAN) tablet 400 mg     400 mg Oral Daily 07/04/17 1110 07/16/17 0959   07/01/17 1030  fluconazole (DIFLUCAN) IVPB 400 mg  Status:  Discontinued     400 mg 100 mL/hr over 120 Minutes Intravenous Every 24 hours 06/30/17 0957 07/04/17 1110   06/30/17 1830  piperacillin-tazobactam (ZOSYN) IVPB 3.375 g     3.375 g 12.5 mL/hr over 240 Minutes Intravenous Every 8 hours 06/30/17 1106 07/15/17 2359   06/30/17 1115  piperacillin-tazobactam (ZOSYN) IVPB 3.375 g     3.375 g 100 mL/hr over 30 Minutes Intravenous  Once 06/30/17 1046 06/30/17 1412   06/30/17 1030   fluconazole (DIFLUCAN) IVPB 800 mg     800 mg 200 mL/hr over 120 Minutes Intravenous  Once 06/30/17 0957 06/30/17 1537   06/29/17 2130  ciprofloxacin (CIPRO) IVPB 400 mg  Status:  Discontinued     400 mg 200 mL/hr over 60 Minutes Intravenous Every 12 hours 06/29/17 1106 06/30/17 1046   06/29/17 1730  metroNIDAZOLE (FLAGYL) IVPB 500 mg  Status:  Discontinued     500 mg 100 mL/hr over 60 Minutes Intravenous Every 8 hours 06/29/17 1106 06/30/17 1046   06/29/17 1000  metroNIDAZOLE (FLAGYL) IVPB 500 mg  Status:  Discontinued     500 mg 100 mL/hr over 60 Minutes Intravenous Every 8 hours 06/29/17 0915 06/29/17 1117   06/29/17 1000  ciprofloxacin (CIPRO) IVPB 400 mg  Status:  Discontinued     400 mg 200 mL/hr over 60 Minutes Intravenous Every 12 hours 06/29/17 0915 06/29/17 1117       Assessment/Plan: Patient Active Problem List   Diagnosis Date Noted  . Diverticulitis of colon with perforation 06/29/2017  . Diverticulitis 06/29/2017  . Erythema nodosum 01/31/2016  . Hypothyroidism 01/31/2016  . Adenopathy   . Wound infection   . Cellulitis of both lower extremities 01/28/2016  . Lymphedema of both lower extremities 01/28/2016  . Increased anion gap metabolic acidosis 42/59/5638  . Allergic angioedema 06/02/2014  . Ulcer of left lower leg (HCC)  06/01/2014  . History of HIT (heparin-induced thrombocytopenia)  09/24/2013  . Thrombocytopenia, unspecified ([place]) 09/21/2013  . Anemia 09/21/2013  . Drug induced neutropenia(288.03) 09/21/2013  . HTN (hypertension) 08/21/2013  . OSA (obstructive sleep apnea) 12/25/2008   POD#8- laparotomy, sigmoid colectomy and end transverse colostomy, left salpingectomy, repair of cystotomy Continues to improve - afebrile, wbc down to 10.7 TTE pending She will be able to go home once bowel function through ostomy; planning 14d course of IV pip/tazo + PO antifungal per ID - advanced home care in place for home infusion Foley will remain in for a total  of 14 days Continue miralax BID; if no stoma output by tomorrow, will plan CT abd/pelvis with contrast Continue to encourage PO intake - soft diet Continue PT/OT - Home health Continue stoma RN for outpatient planning and education PPx: SCDs, PPI Dispo: Home health for IV abx, wound VAC, PT & OT   LOS: 8 days   Sharon Mt. Dema Severin, M.D. General and Colorectal Surgery [facility]

## 2017-07-07 NOTE — Progress Notes (Signed)
  Echocardiogram 2D Echocardiogram has been performed.  Darlina Sicilian M 07/07/2017, 11:37 AM

## 2017-07-07 NOTE — Progress Notes (Signed)
Pt c/o of feeling of needing to void, but unable.  Noted brown sediment in catheter. Foley catheter irrigated with 10cc NS.  MD paged.  Will continue to monitor.  Eliezer Bottom Brewer

## 2017-07-08 ENCOUNTER — Encounter (INDEPENDENT_AMBULATORY_CARE_PROVIDER_SITE_OTHER): Payer: BLUE CROSS/BLUE SHIELD | Admitting: Ophthalmology

## 2017-07-08 ENCOUNTER — Inpatient Hospital Stay (HOSPITAL_COMMUNITY): Payer: BLUE CROSS/BLUE SHIELD

## 2017-07-08 LAB — CBC WITH DIFFERENTIAL/PLATELET
Basophils Absolute: 0 10*3/uL (ref 0.0–0.1)
Basophils Relative: 0 %
Eosinophils Absolute: 0.3 10*3/uL (ref 0.0–0.7)
Eosinophils Relative: 3 %
HCT: 24.7 % — ABNORMAL LOW (ref 36.0–46.0)
Hemoglobin: 7.6 g/dL — ABNORMAL LOW (ref 12.0–15.0)
Lymphocytes Relative: 20 %
Lymphs Abs: 2.1 10*3/uL (ref 0.7–4.0)
MCH: 24.7 pg — ABNORMAL LOW (ref 26.0–34.0)
MCHC: 30.8 g/dL (ref 30.0–36.0)
MCV: 80.2 fL (ref 78.0–100.0)
Monocytes Absolute: 0.5 10*3/uL (ref 0.1–1.0)
Monocytes Relative: 5 %
Neutro Abs: 7.7 10*3/uL (ref 1.7–7.7)
Neutrophils Relative %: 72 %
Platelets: 374 10*3/uL (ref 150–400)
RBC: 3.08 MIL/uL — ABNORMAL LOW (ref 3.87–5.11)
RDW: 18.9 % — ABNORMAL HIGH (ref 11.5–15.5)
WBC: 10.6 10*3/uL — ABNORMAL HIGH (ref 4.0–10.5)

## 2017-07-08 LAB — MAGNESIUM: Magnesium: 2 mg/dL (ref 1.7–2.4)

## 2017-07-08 LAB — BASIC METABOLIC PANEL
Anion gap: 12 (ref 5–15)
BUN: 8 mg/dL (ref 6–20)
CO2: 22 mmol/L (ref 22–32)
Calcium: 8.5 mg/dL — ABNORMAL LOW (ref 8.9–10.3)
Chloride: 103 mmol/L (ref 101–111)
Creatinine, Ser: 1.25 mg/dL — ABNORMAL HIGH (ref 0.44–1.00)
GFR calc Af Amer: 52 mL/min — ABNORMAL LOW (ref >60)
GFR calc non Af Amer: 44 mL/min — ABNORMAL LOW (ref >60)
Glucose, Bld: 91 mg/dL (ref 65–99)
Potassium: 4.1 mmol/L (ref 3.5–5.1)
Sodium: 137 mmol/L (ref 135–145)

## 2017-07-08 LAB — PHOSPHORUS: Phosphorus: 4.3 mg/dL (ref 2.5–4.6)

## 2017-07-08 MED ORDER — IOPAMIDOL (ISOVUE-300) INJECTION 61%
INTRAVENOUS | Status: AC
  Filled 2017-07-08: qty 30

## 2017-07-08 MED ORDER — WHITE PETROLATUM EX OINT
TOPICAL_OINTMENT | CUTANEOUS | Status: AC
  Filled 2017-07-08: qty 28.35

## 2017-07-08 MED ORDER — IOPAMIDOL (ISOVUE-300) INJECTION 61%
INTRAVENOUS | Status: AC
  Administered 2017-07-08: 100 mL
  Filled 2017-07-08: qty 50

## 2017-07-08 MED ORDER — IOPAMIDOL (ISOVUE-300) INJECTION 61%
INTRAVENOUS | Status: AC
  Filled 2017-07-08: qty 100

## 2017-07-08 MED ORDER — IOPAMIDOL (ISOVUE-370) INJECTION 76%
INTRAVENOUS | Status: DC
Start: 2017-07-08 — End: 2017-07-08
  Filled 2017-07-08: qty 50

## 2017-07-08 NOTE — Consult Note (Signed)
[place] Nurse wound follow up Vac dressing change to wound Wound type: Full thickness post-op wound to midline abd Wound bed: Appearance unchanged since previous assessment; has decreased in size; 20X2.5X2cm, beefy red  Drainage (amount, consistency, odor) mod amt pink drainage, no odor Periwound: intact skin surrounding Dressing procedure/placement/frequency: Applied one piece of black foam to 146mm cont suction. Pt medicated for pain prior to the procedure but still had large amt discomfort. WOC will plan to change dressing Q M/W/F since ostomy pouch is located next to the wound and both dressing and pouch will need to be changed each time by home health nurses after discharge.  [place] Nurse ostomy follow-up consultnote  Stoma type/location: Colostomy stoma from surgery on 10/15. Stomal assessment/size: Stoma red and viable, flush with skin level, 1 inch Peristomal assessment: intact skin surrounding Output: Noflatusor stool in the pouch, since yesterday Ostomy pouching: 1pc.  Education provided: Demonstrated pouch change and patient watched the procedure but did not look at the stoma. Applied barrier ring and one piece flexible convex pouch.Pt is able to open and close the velcro to empty. Supplies at the bedside for bedside nurse use. Reviewed pouching routines and ordering supplies. Educational materials left at the bedside. WOC will continue to follow for further teaching sessions. Enrolled patient in [place] program: Yes Julien Girt MSN, RN, [place], [place], [place]

## 2017-07-08 NOTE — Progress Notes (Signed)
Occupational Therapy Treatment Patient Details Name: Lindsay [name] MRN: [id number] DOB: 09-Mar-1951 Today's Date: 07/08/2017    History of present illness Pt is a 66 y.o. female admitted with perforated diverticulitis. She underwent laparotomy/sigmoid colectomy 06-29-17. Pertinent PMH includes lymphedema, HTN, cellulitis.    OT comments  Pt educated in use of AE for LB ADL and assisted to sink to brush her teeth. Tolerated well.   Follow Up Recommendations  Home health OT    Equipment Recommendations  None recommended by OT    Recommendations for Other Services      Precautions / Restrictions Precautions Precautions: Fall;Other (comment) Precaution Comments: wound vac to abd, JP drain, colostomy       Mobility Bed Mobility               General bed mobility comments: pt in chair  Transfers Overall transfer level: Needs assistance Equipment used: Rolling walker (2 wheeled) Transfers: Sit to/from Stand Sit to Stand: Min guard         General transfer comment: pt with good technique, increased time    Balance Overall balance assessment: Needs assistance   Sitting balance-Leahy Scale: Good       Standing balance-Leahy Scale: Fair Standing balance comment: able to release walker in standing for grooming                           ADL either performed or assessed with clinical judgement   ADL Overall ADL's : Needs assistance/impaired     Grooming: Oral care;Standing;Min guard         Lower Body Bathing Details (indicate cue type and reason): educated in use of long handled bath sponge, pt has a bath brush at home Upper Body Dressing : Set up;Sitting   Lower Body Dressing: Min guard;Sit to/from stand Lower Body Dressing Details (indicate cue type and reason): educated in use of reacher (pt has at home), sock aide and long shoe horn             Functional mobility during ADLs: Min guard;Rolling walker General ADL Comments: educated in  safe footwear     Vision       Perception     Praxis      Cognition Arousal/Alertness: Awake/alert Behavior During Therapy: WFL for tasks assessed/performed Overall Cognitive Status: Within Functional Limits for tasks assessed                                          Exercises     Shoulder Instructions       General Comments      Pertinent Vitals/ Pain       Pain Assessment: No/denies pain  Home Living                                          Prior Functioning/Environment              Frequency  Min 2X/week        Progress Toward Goals  OT Goals(current goals can now be found in the care plan section)  Progress towards OT goals: Progressing toward goals  Acute Rehab OT Goals Patient Stated Goal: Return home OT Goal Formulation: With patient/family Time For Goal Achievement: 07/14/17 Potential to Achieve  Goals: Good  Plan Discharge plan remains appropriate    Co-evaluation                 AM-PAC PT "6 Clicks" Daily Activity     Outcome Measure   Help from another person eating meals?: None Help from another person taking care of personal grooming?: A Little Help from another person toileting, which includes using toliet, bedpan, or urinal?: A Little Help from another person bathing (including washing, rinsing, drying)?: A Lot Help from another person to put on and taking off regular upper body clothing?: None Help from another person to put on and taking off regular lower body clothing?: A Little 6 Click Score: 19    End of Session Equipment Utilized During Treatment: Rolling walker  OT Visit Diagnosis: Unsteadiness on feet (R26.81);Pain   Activity Tolerance Patient tolerated treatment well   Patient Left in chair;with call bell/phone within reach   Nurse Communication          Time: 6967-8938 OT Time Calculation (min): 15 min  Charges: OT General Charges $OT Visit: 1 Visit OT  Treatments $Self Care/Home Management : 8-22 mins    Malka So 07/08/2017, 11:51 AM  07/08/2017 Nestor Lewandowsky, OTR/L Pager: [phone]

## 2017-07-08 NOTE — Progress Notes (Addendum)
Subjective No acute events. Denies complaints. Tolerating some food without n/v, but decreased appetite. No flatus/stool from stoma today. Findings of TTE noted - possible vegetation on aortic valve. Recommendations from ID pending.  Objective: Vital signs in last 24 hours: Temp:  [98 F (36.7 C)-98.4 F (36.9 C)] 98 F (36.7 C) (10/24 0620) Pulse Rate:  [90-96] 96 (10/24 0620) Resp:  [18-20] 20 (10/24 0620) BP: (138-156)/(69-84) 152/84 (10/24 0620) SpO2:  [96 %-98 %] 98 % (10/24 0620) Last BM Date: 07/06/17  Intake/Output from previous day: 10/23 0701 - 10/24 0700 In: 463 [P.O.:120; I.V.:333] Out: 2590 [Urine:2550; Drains:40] Intake/Output this shift: Total I/O In: -  Out: 900 [Urine:900]  Gen: NAD, comfortable CV: RRR Pulm: Normal work of breathing Abd: Soft, NT, mildly distended; stoma pink Ext: SCDs in place  Lab Results: CBC   Recent Labs  07/07/17 0509 07/08/17 0457  WBC 10.7* 10.6*  HGB 7.6* 7.6*  HCT 24.0* 24.7*  PLT 305 374   BMET  Recent Labs  07/07/17 0509 07/08/17 0457  NA 134* 137  K 4.0 4.1  CL 103 103  CO2 23 22  GLUCOSE 106* 91  BUN 7 8  CREATININE 1.25* 1.25*  CALCIUM 8.2* 8.5*   PT/INR No results for input(s): LABPROT, INR in the last 72 hours. ABG No results for input(s): PHART, HCO3 in the last 72 hours.  Invalid input(s): PCO2, PO2  Studies/Results:  Anti-infectives: Anti-infectives    Start     Dose/Rate Route Frequency Ordered Stop   07/06/17 1130  valACYclovir (VALTREX) tablet 2,000 mg     2,000 mg Oral 2 times daily 07/06/17 1129 07/06/17 2214   07/05/17 1000  fluconazole (DIFLUCAN) tablet 400 mg     400 mg Oral Daily 07/04/17 1110 07/16/17 0959   07/01/17 1030  fluconazole (DIFLUCAN) IVPB 400 mg  Status:  Discontinued     400 mg 100 mL/hr over 120 Minutes Intravenous Every 24 hours 06/30/17 0957 07/04/17 1110   06/30/17 1830  piperacillin-tazobactam (ZOSYN) IVPB 3.375 g     3.375 g 12.5 mL/hr over 240 Minutes  Intravenous Every 8 hours 06/30/17 1106 07/15/17 2359   06/30/17 1115  piperacillin-tazobactam (ZOSYN) IVPB 3.375 g     3.375 g 100 mL/hr over 30 Minutes Intravenous  Once 06/30/17 1046 06/30/17 1412   06/30/17 1030  fluconazole (DIFLUCAN) IVPB 800 mg     800 mg 200 mL/hr over 120 Minutes Intravenous  Once 06/30/17 0957 06/30/17 1537   06/29/17 2130  ciprofloxacin (CIPRO) IVPB 400 mg  Status:  Discontinued     400 mg 200 mL/hr over 60 Minutes Intravenous Every 12 hours 06/29/17 1106 06/30/17 1046   06/29/17 1730  metroNIDAZOLE (FLAGYL) IVPB 500 mg  Status:  Discontinued     500 mg 100 mL/hr over 60 Minutes Intravenous Every 8 hours 06/29/17 1106 06/30/17 1046   06/29/17 1000  metroNIDAZOLE (FLAGYL) IVPB 500 mg  Status:  Discontinued     500 mg 100 mL/hr over 60 Minutes Intravenous Every 8 hours 06/29/17 0915 06/29/17 1117   06/29/17 1000  ciprofloxacin (CIPRO) IVPB 400 mg  Status:  Discontinued     400 mg 200 mL/hr over 60 Minutes Intravenous Every 12 hours 06/29/17 0915 06/29/17 1117       Assessment/Plan: Patient Active Problem List   Diagnosis Date Noted  . Diverticulitis of colon with perforation 06/29/2017  . Diverticulitis 06/29/2017  . Erythema nodosum 01/31/2016  . Hypothyroidism 01/31/2016  . Adenopathy   . Wound  infection   . Cellulitis of both lower extremities 01/28/2016  . Lymphedema of both lower extremities 01/28/2016  . Increased anion gap metabolic acidosis 76/72/0947  . Allergic angioedema 06/02/2014  . Ulcer of left lower leg ([place]) 06/01/2014  . History of HIT (heparin-induced thrombocytopenia)  09/24/2013  . Thrombocytopenia, unspecified ([place]) 09/21/2013  . Anemia 09/21/2013  . Drug induced neutropenia(288.03) 09/21/2013  . HTN (hypertension) 08/21/2013  . OSA (obstructive sleep apnea) 12/25/2008   POD#9- laparotomy, sigmoid colectomy and end transverse colostomy, left salpingectomy, repair of cystotomy Continues to improve CT A/P today to evaluate  for intra-abd abscess given ongoing distention and minimal stoma output for the last 5 days TTE shows AV possible vegetation - recommendations from ID pending; will obtain TEE She will be able to go home once bowel function through ostomy; planning 14d course of IV pip/tazo + PO antifungal per ID - TEE pending - advanced home care in place for home infusion Foley will remain in for a total of 14 days Ambulate 5x/day IS 10x/hr while awake Continue miralax BID; if no stoma output by tomorrow, will plan CT abd/pelvis with contrast Continue to encourage PO intake - soft diet Continue PT/OT - Home health Continue stoma RN for outpatient planning and education PPx: SCDs, PPI Dispo: Home health for IV abx, wound VAC, PT & OT   LOS: 9 days   Sharon Mt. Dema Severin, M.D. General and Colorectal Surgery [facility]

## 2017-07-08 NOTE — Progress Notes (Signed)
Physical Therapy Treatment Patient Details Name: Lindsay [name] MRN: [id number] DOB: [birth year] Today's Date: 07/08/2017    History of Present Illness Pt is a 66 y.o. female admitted with perforated diverticulitis. She underwent laparotomy/sigmoid colectomy 06-29-17. Pt postive for ileus after testing today (07/08/17). Pertinent PMH includes lymphedema, HTN, cellulitis.     PT Comments    Pt was able to ambulate a longer distance today with only 2 rest breaks. Pt reported discomfort from having dry mouth. No reports of SOB during ambulation. Several vc for upright posture while ambulating with RW.  Reported to nurse about pt's catheter and wound vac during today's session. Continue to motivate pt to ambulate to help with backed up bowels.   Follow Up Recommendations  Home health PT;Supervision/Assistance - 24 hour     Equipment Recommendations  None recommended by PT    Recommendations for Other Services       Precautions / Restrictions Precautions Precautions: Fall;Other (comment) Precaution Comments: wound vac to abd, JP drain, colostomy Restrictions Weight Bearing Restrictions: No    Mobility  Bed Mobility Overal bed mobility: Needs Assistance Bed Mobility:  (sitting pivot)     Supine to sit:  (sitting pivot)     General bed mobility comments: Vc needed for hand placement to help push up to short sitting position.   Transfers Overall transfer level: Needs assistance Equipment used: Rolling walker (2 wheeled) Transfers: Sit to/from Stand Sit to Stand: Min guard         General transfer comment: vc needed to scoot forward and for proper hand placement for sit to stand and stand to sit.  Ambulation/Gait Ambulation/Gait assistance: Min guard Ambulation Distance (Feet): 730 Feet Assistive device: Rolling walker (2 wheeled) Gait Pattern/deviations: Step-through pattern;Decreased stride length;Trunk flexed     General Gait Details: Vc needed to fix flexed posture  while ambulating with RW; Rest breaks x2. Pt stated she wasn't too tired while ambulating just very thirsty.    Stairs            Wheelchair Mobility    Modified Rankin (Stroke Patients Only)       Balance Overall balance assessment: Needs assistance Sitting-balance support: No upper extremity supported;Feet supported Sitting balance-Leahy Scale: Good     Standing balance support: Bilateral upper extremity supported (on walker) Standing balance-Leahy Scale: Good Standing balance comment: was able to ambulate with RW, step-through gait pattern, min guard                            Cognition Arousal/Alertness: Awake/alert Behavior During Therapy: WFL for tasks assessed/performed Overall Cognitive Status: Within Functional Limits for tasks assessed                                        Exercises      General Comments        Pertinent Vitals/Pain Pain Assessment: No/denies pain Pain Descriptors / Indicators: Pressure;Discomfort Pain Intervention(s): Monitored during session    Home Living                      Prior Function            PT Goals (current goals can now be found in the care plan section) Acute Rehab PT Goals Patient Stated Goal: Return home    Frequency    Min 3X/week  PT Plan Current plan remains appropriate    Co-evaluation              AM-PAC PT "6 Clicks" Daily Activity  Outcome Measure  Difficulty turning over in bed (including adjusting bedclothes, sheets and blankets)?: A Lot Difficulty moving from lying on back to sitting on the side of the bed? : A Lot Difficulty sitting down on and standing up from a chair with arms (e.g., wheelchair, bedside commode, etc,.)?: A Little Help needed moving to and from a bed to chair (including a wheelchair)?: A Little Help needed walking in hospital room?: A Little Help needed climbing 3-5 steps with a railing? : A Lot 6 Click Score: 15     End of Session Equipment Utilized During Treatment: Gait belt Activity Tolerance: Patient tolerated treatment well Patient left: in chair;with call bell/phone within reach Nurse Communication: Other (comment) (informed nurse of wound VAC possible setting adjustments and catheter leaking) PT Visit Diagnosis: Difficulty in walking, not elsewhere classified (R26.2);Pain     Time: 3532-9924 PT Time Calculation (min) (ACUTE ONLY): 36 min  Charges:  $Gait Training: 8-22 mins $Therapeutic Activity: 8-22 mins                    G Codes:  Functional Assessment Tool Used: AM-PAC 6 Clicks Basic Mobility    Fransisca Connors, SPTA   Fransisca Connors 07/08/2017, 2:53 PM

## 2017-07-09 LAB — BASIC METABOLIC PANEL
Anion gap: 15 (ref 5–15)
BUN: 6 mg/dL (ref 6–20)
CO2: 19 mmol/L — ABNORMAL LOW (ref 22–32)
Calcium: 8.5 mg/dL — ABNORMAL LOW (ref 8.9–10.3)
Chloride: 101 mmol/L (ref 101–111)
Creatinine, Ser: 1.25 mg/dL — ABNORMAL HIGH (ref 0.44–1.00)
GFR calc Af Amer: 52 mL/min — ABNORMAL LOW (ref >60)
GFR calc non Af Amer: 44 mL/min — ABNORMAL LOW (ref >60)
Glucose, Bld: 79 mg/dL (ref 65–99)
Potassium: 4.1 mmol/L (ref 3.5–5.1)
Sodium: 135 mmol/L (ref 135–145)

## 2017-07-09 LAB — URINALYSIS, ROUTINE W REFLEX MICROSCOPIC
Bilirubin Urine: NEGATIVE
Glucose, UA: NEGATIVE mg/dL
Ketones, ur: 80 mg/dL — AB
Nitrite: NEGATIVE
Protein, ur: 30 mg/dL — AB
Specific Gravity, Urine: 1.02 (ref 1.005–1.030)
pH: 5 (ref 5.0–8.0)

## 2017-07-09 LAB — COMPREHENSIVE METABOLIC PANEL
ALT: 28 U/L (ref 14–54)
AST: 30 U/L (ref 15–41)
Albumin: 2.3 g/dL — ABNORMAL LOW (ref 3.5–5.0)
Alkaline Phosphatase: 83 U/L (ref 38–126)
Anion gap: 13 (ref 5–15)
BUN: 6 mg/dL (ref 6–20)
CO2: 20 mmol/L — ABNORMAL LOW (ref 22–32)
Calcium: 8.4 mg/dL — ABNORMAL LOW (ref 8.9–10.3)
Chloride: 103 mmol/L (ref 101–111)
Creatinine, Ser: 1.18 mg/dL — ABNORMAL HIGH (ref 0.44–1.00)
GFR calc Af Amer: 55 mL/min — ABNORMAL LOW (ref >60)
GFR calc non Af Amer: 48 mL/min — ABNORMAL LOW (ref >60)
Glucose, Bld: 72 mg/dL (ref 65–99)
Potassium: 4 mmol/L (ref 3.5–5.1)
Sodium: 136 mmol/L (ref 135–145)
Total Bilirubin: 1.2 mg/dL (ref 0.3–1.2)
Total Protein: 6.3 g/dL — ABNORMAL LOW (ref 6.5–8.1)

## 2017-07-09 LAB — CBC
HCT: 25.1 % — ABNORMAL LOW (ref 36.0–46.0)
Hemoglobin: 7.6 g/dL — ABNORMAL LOW (ref 12.0–15.0)
MCH: 24.8 pg — ABNORMAL LOW (ref 26.0–34.0)
MCHC: 30.3 g/dL (ref 30.0–36.0)
MCV: 81.8 fL (ref 78.0–100.0)
Platelets: 503 10*3/uL — ABNORMAL HIGH (ref 150–400)
RBC: 3.07 MIL/uL — ABNORMAL LOW (ref 3.87–5.11)
RDW: 19.7 % — ABNORMAL HIGH (ref 11.5–15.5)
WBC: 14 10*3/uL — ABNORMAL HIGH (ref 4.0–10.5)

## 2017-07-09 LAB — CBC WITH DIFFERENTIAL/PLATELET
Basophils Absolute: 0 10*3/uL (ref 0.0–0.1)
Basophils Relative: 0 %
Eosinophils Absolute: 0.4 10*3/uL (ref 0.0–0.7)
Eosinophils Relative: 3 %
HCT: 25.3 % — ABNORMAL LOW (ref 36.0–46.0)
Hemoglobin: 7.7 g/dL — ABNORMAL LOW (ref 12.0–15.0)
Lymphocytes Relative: 17 %
Lymphs Abs: 2 10*3/uL (ref 0.7–4.0)
MCH: 24.8 pg — ABNORMAL LOW (ref 26.0–34.0)
MCHC: 30.4 g/dL (ref 30.0–36.0)
MCV: 81.6 fL (ref 78.0–100.0)
Monocytes Absolute: 0.4 10*3/uL (ref 0.1–1.0)
Monocytes Relative: 4 %
Neutro Abs: 8.6 10*3/uL — ABNORMAL HIGH (ref 1.7–7.7)
Neutrophils Relative %: 76 %
Platelets: 464 10*3/uL — ABNORMAL HIGH (ref 150–400)
RBC: 3.1 MIL/uL — ABNORMAL LOW (ref 3.87–5.11)
RDW: 19.3 % — ABNORMAL HIGH (ref 11.5–15.5)
WBC: 11.3 10*3/uL — ABNORMAL HIGH (ref 4.0–10.5)

## 2017-07-09 LAB — PHOSPHORUS: Phosphorus: 3.9 mg/dL (ref 2.5–4.6)

## 2017-07-09 LAB — PREALBUMIN: Prealbumin: 13.9 mg/dL — ABNORMAL LOW (ref 18–38)

## 2017-07-09 LAB — TRIGLYCERIDES: Triglycerides: 153 mg/dL — ABNORMAL HIGH (ref <150)

## 2017-07-09 LAB — GLUCOSE, CAPILLARY: Glucose-Capillary: 70 mg/dL (ref 65–99)

## 2017-07-09 LAB — MAGNESIUM: Magnesium: 1.9 mg/dL (ref 1.7–2.4)

## 2017-07-09 NOTE — Progress Notes (Signed)
Subjective No acute events. CT a/p + cystogram yesterday - no abscess; possible L pyelonephritis; findings suggestive of ileus. Denies complaints. Tolerating liquids without n/v; no appetite. No flatus/stool from stoma. TEE pending for possible AV vegetation seen on TTE. Recommendations from ID pending.  Objective: Vital signs in last 24 hours: Temp:  [98.2 F (36.8 C)-98.9 F (37.2 C)] 98.8 F (37.1 C) (10/25 1030) Pulse Rate:  [83-97] 83 (10/25 1030) Resp:  [19-20] 19 (10/25 0605) BP: (145-159)/(65-75) 147/65 (10/25 1030) SpO2:  [97 %-98 %] 97 % (10/25 1030) Last BM Date: 06/29/17  Intake/Output from previous day: 10/24 0701 - 10/25 0700 In: 1620 [P.O.:370; I.V.:250] Out: 3905 [Urine:3900; Drains:5] Intake/Output this shift: Total I/O In: 0  Out: 100 [Drains:100]   JP: 5cc/24hrs, serous  Gen: NAD, comfortable CV: RRR Pulm: Normal work of breathing Abd: Soft, NT, mildly distended; stoma pink; JP with scant serous fluid in bulb Ext: SCDs in place  Lab Results: CBC   Recent Labs  07/08/17 0457 07/09/17 0403  WBC 10.6* 11.3*  HGB 7.6* 7.7*  HCT 24.7* 25.3*  PLT 374 464*   BMET  Recent Labs  07/08/17 0457 07/09/17 0403  NA 137 136  K 4.1 4.0  CL 103 103  CO2 22 20*  GLUCOSE 91 72  BUN 8 6  CREATININE 1.25* 1.18*  CALCIUM 8.5* 8.4*   Anti-infectives: Anti-infectives    Start     Dose/Rate Route Frequency Ordered Stop   07/06/17 1130  valACYclovir (VALTREX) tablet 2,000 mg     2,000 mg Oral 2 times daily 07/06/17 1129 07/06/17 2214   07/05/17 1000  fluconazole (DIFLUCAN) tablet 400 mg     400 mg Oral Daily 07/04/17 1110 07/16/17 0959   07/01/17 1030  fluconazole (DIFLUCAN) IVPB 400 mg  Status:  Discontinued     400 mg 100 mL/hr over 120 Minutes Intravenous Every 24 hours 06/30/17 0957 07/04/17 1110   06/30/17 1830  piperacillin-tazobactam (ZOSYN) IVPB 3.375 g     3.375 g 12.5 mL/hr over 240 Minutes Intravenous Every 8 hours 06/30/17 1106 07/15/17  2359   06/30/17 1115  piperacillin-tazobactam (ZOSYN) IVPB 3.375 g     3.375 g 100 mL/hr over 30 Minutes Intravenous  Once 06/30/17 1046 06/30/17 1412   06/30/17 1030  fluconazole (DIFLUCAN) IVPB 800 mg     800 mg 200 mL/hr over 120 Minutes Intravenous  Once 06/30/17 0957 06/30/17 1537   06/29/17 2130  ciprofloxacin (CIPRO) IVPB 400 mg  Status:  Discontinued     400 mg 200 mL/hr over 60 Minutes Intravenous Every 12 hours 06/29/17 1106 06/30/17 1046   06/29/17 1730  metroNIDAZOLE (FLAGYL) IVPB 500 mg  Status:  Discontinued     500 mg 100 mL/hr over 60 Minutes Intravenous Every 8 hours 06/29/17 1106 06/30/17 1046   06/29/17 1000  metroNIDAZOLE (FLAGYL) IVPB 500 mg  Status:  Discontinued     500 mg 100 mL/hr over 60 Minutes Intravenous Every 8 hours 06/29/17 0915 06/29/17 1117   06/29/17 1000  ciprofloxacin (CIPRO) IVPB 400 mg  Status:  Discontinued     400 mg 200 mL/hr over 60 Minutes Intravenous Every 12 hours 06/29/17 0915 06/29/17 1117      Assessment/Plan: Patient Active Problem List   Diagnosis Date Noted  . Diverticulitis of colon with perforation 06/29/2017  . Diverticulitis 06/29/2017  . Erythema nodosum 01/31/2016  . Hypothyroidism 01/31/2016  . Adenopathy   . Wound infection   . Cellulitis of both lower extremities 01/28/2016  .  Lymphedema of both lower extremities 01/28/2016  . Increased anion gap metabolic acidosis 81/44/8185  . Allergic angioedema 06/02/2014  . Ulcer of left lower leg ([place]) 06/01/2014  . History of HIT (heparin-induced thrombocytopenia)  09/24/2013  . Thrombocytopenia, unspecified ([place]) 09/21/2013  . Anemia 09/21/2013  . Drug induced neutropenia(288.03) 09/21/2013  . HTN (hypertension) 08/21/2013  . OSA (obstructive sleep apnea) 12/25/2008   POD#10- laparotomy, sigmoid colectomy and end transverse colostomy, left salpingectomy, repair of cystotomy -Ordered TPN given expected postop ileus and inability to reliably eat much food; sips of clears  for now -TTE shows AV possible vegetation - recommendations from ID pending; will obtain TEE She will be able to go home once bowel function through ostomy; planning 14d course of IV pip/tazo + PO antifungal per ID - TEE pending - advanced home care in place for home infusion Foley will remain in for a total of 14 days Ambulate 5x/day IS 10x/hr while awake Continue miralax BID Continue PT/OT - Home health Continue stoma RN for outpatient planning and education PPx: SCDs, PPI Dispo: Home health for IV abx, wound VAC, PT & OT   LOS: 10 days   Sharon Mt. Dema Severin, M.D. General and Colorectal Surgery [facility]

## 2017-07-09 NOTE — Progress Notes (Signed)
Noted patient has no order for TPN. MD made aware.

## 2017-07-09 NOTE — Progress Notes (Addendum)
Family has concerns regarding the patient. They are concerned that the patient is hallucinating and that she is not getting Nutrition. Patient currently taking clear liquids well with no nausea or vomiting. Per pharmacy TPN will be commenced tomorrow. MD made aware of family concerns and received orders for CBC, BMET and CBG. Vitals done. Patient denies pain but says she is not "feeling like herself". Rapid Response Nurse also made aware and will come see patient.

## 2017-07-09 NOTE — Progress Notes (Signed)
Physical Therapy Treatment Patient Details Name: Lindsay [name] MRN: [id number] DOB: [birth year] Today's Date: 07/09/2017    History of Present Illness Pt is a 66 y.o. female admitted with perforated diverticulitis. She underwent laparotomy/sigmoid colectomy 06-29-17. Pt postive for ileus after testing today (07/08/17). Pertinent PMH includes lymphedema, HTN, cellulitis.     PT Comments    Pt more lethargic and hesitant to mobilize.  Pt required increased encouragement from PTA and nurse to participate.  Decreased gait distance during session but patient able to perform limited therapeutic exercises.  Pt remains uncomfortable due to pressure in abdomen.  Plan next session to continue mobility.      Follow Up Recommendations  Home health PT;Supervision/Assistance - 24 hour     Equipment Recommendations  None recommended by PT    Recommendations for Other Services       Precautions / Restrictions Precautions Precautions: Fall;Other (comment) Precaution Comments: wound vac to abd, JP drain, colostomy Restrictions Weight Bearing Restrictions: No    Mobility  Bed Mobility               General bed mobility comments: Pt in recliner on arrival.   Transfers Overall transfer level: Needs assistance Equipment used: Rolling walker (2 wheeled) Transfers: Sit to/from Stand Sit to Stand: Min guard         General transfer comment: VCs for hand placement and positioning in chair pre transfer.    Ambulation/Gait Ambulation/Gait assistance: Min guard Ambulation Distance (Feet): 350 Feet Assistive device: Rolling walker (2 wheeled) Gait Pattern/deviations: Step-through pattern;Decreased stride length;Trunk flexed Gait velocity: dec Gait velocity interpretation: Below normal speed for age/gender General Gait Details: VCs for posture and pacing during gait training.  Pt more fatigued and distance decreased from previous session.     Stairs            Wheelchair  Mobility    Modified Rankin (Stroke Patients Only)       Balance Overall balance assessment: Needs assistance   Sitting balance-Leahy Scale: Good       Standing balance-Leahy Scale: Fair                              Cognition Arousal/Alertness: Awake/alert Behavior During Therapy: WFL for tasks assessed/performed Overall Cognitive Status: Within Functional Limits for tasks assessed                                        Exercises General Exercises - Lower Extremity Ankle Circles/Pumps: AROM;Both;10 reps;Supine Long Arc Quad: AROM;Both;10 reps;Supine Hip ABduction/ADduction: AROM;Both;10 reps;Supine Straight Leg Raises:  (attempted but unable.  )    General Comments        Pertinent Vitals/Pain Pain Assessment: Faces Faces Pain Scale: Hurts even more Pain Location: abdomen Pain Descriptors / Indicators: Discomfort    Home Living                      Prior Function            PT Goals (current goals can now be found in the care plan section) Acute Rehab PT Goals Patient Stated Goal: Return home Potential to Achieve Goals: Good Progress towards PT goals: Progressing toward goals    Frequency    Min 3X/week      PT Plan Current plan remains appropriate    Co-evaluation  AM-PAC PT "6 Clicks" Daily Activity  Outcome Measure  Difficulty turning over in bed (including adjusting bedclothes, sheets and blankets)?: A Lot Difficulty moving from lying on back to sitting on the side of the bed? : A Lot Difficulty sitting down on and standing up from a chair with arms (e.g., wheelchair, bedside commode, etc,.)?: A Little Help needed moving to and from a bed to chair (including a wheelchair)?: A Little Help needed walking in hospital room?: A Little Help needed climbing 3-5 steps with a railing? : A Lot 6 Click Score: 15    End of Session Equipment Utilized During Treatment: Gait belt Activity  Tolerance: Patient tolerated treatment well Patient left: in chair;with call bell/phone within reach Nurse Communication: Mobility status PT Visit Diagnosis: Difficulty in walking, not elsewhere classified (R26.2);Pain Pain - Right/Left:  (abdomen)     Time: 2094-7096 PT Time Calculation (min) (ACUTE ONLY): 27 min  Charges:  $Gait Training: 8-22 mins $Therapeutic Exercise: 8-22 mins                    G Codes:       Governor Rooks, PTA pager [phone]    Cristela Blue 07/09/2017, 4:31 PM

## 2017-07-10 LAB — BASIC METABOLIC PANEL
Anion gap: 12 (ref 5–15)
BUN: 6 mg/dL (ref 6–20)
CO2: 22 mmol/L (ref 22–32)
Calcium: 8.4 mg/dL — ABNORMAL LOW (ref 8.9–10.3)
Chloride: 102 mmol/L (ref 101–111)
Creatinine, Ser: 1.19 mg/dL — ABNORMAL HIGH (ref 0.44–1.00)
GFR calc Af Amer: 55 mL/min — ABNORMAL LOW (ref >60)
GFR calc non Af Amer: 47 mL/min — ABNORMAL LOW (ref >60)
Glucose, Bld: 91 mg/dL (ref 65–99)
Potassium: 3.9 mmol/L (ref 3.5–5.1)
Sodium: 136 mmol/L (ref 135–145)

## 2017-07-10 LAB — CBC WITH DIFFERENTIAL/PLATELET
Basophils Absolute: 0 10*3/uL (ref 0.0–0.1)
Basophils Relative: 0 %
Eosinophils Absolute: 0.3 10*3/uL (ref 0.0–0.7)
Eosinophils Relative: 3 %
HCT: 23.4 % — ABNORMAL LOW (ref 36.0–46.0)
Hemoglobin: 7.3 g/dL — ABNORMAL LOW (ref 12.0–15.0)
Lymphocytes Relative: 16 %
Lymphs Abs: 1.8 10*3/uL (ref 0.7–4.0)
MCH: 25.4 pg — ABNORMAL LOW (ref 26.0–34.0)
MCHC: 31.2 g/dL (ref 30.0–36.0)
MCV: 81.5 fL (ref 78.0–100.0)
Monocytes Absolute: 0.6 10*3/uL (ref 0.1–1.0)
Monocytes Relative: 6 %
Neutro Abs: 8.6 10*3/uL — ABNORMAL HIGH (ref 1.7–7.7)
Neutrophils Relative %: 75 %
Platelets: 465 10*3/uL — ABNORMAL HIGH (ref 150–400)
RBC: 2.87 MIL/uL — ABNORMAL LOW (ref 3.87–5.11)
RDW: 19.6 % — ABNORMAL HIGH (ref 11.5–15.5)
WBC: 11.4 10*3/uL — ABNORMAL HIGH (ref 4.0–10.5)

## 2017-07-10 LAB — PHOSPHORUS: Phosphorus: 3.3 mg/dL (ref 2.5–4.6)

## 2017-07-10 LAB — MAGNESIUM: Magnesium: 1.9 mg/dL (ref 1.7–2.4)

## 2017-07-10 MED ORDER — TRAVASOL 10 % IV SOLN
INTRAVENOUS | Status: AC
  Administered 2017-07-10: 18:00:00 via INTRAVENOUS
  Filled 2017-07-10: qty 439.2

## 2017-07-10 MED ORDER — BOOST / RESOURCE BREEZE PO LIQD
1.0000 | Freq: Three times a day (TID) | ORAL | Status: DC
  Administered 2017-07-11 – 2017-07-14 (×4): 1 via ORAL

## 2017-07-10 MED ORDER — SODIUM CHLORIDE 0.45 % IV SOLN
INTRAVENOUS | Status: AC
  Administered 2017-07-10: 21:00:00 via INTRAVENOUS

## 2017-07-10 MED ORDER — INSULIN ASPART 100 UNIT/ML ~~LOC~~ SOLN
0.0000 [IU] | Freq: Four times a day (QID) | SUBCUTANEOUS | Status: AC
  Administered 2017-07-11 – 2017-07-14 (×11): 1 [IU] via SUBCUTANEOUS
  Administered 2017-07-14 – 2017-07-15 (×2): 2 [IU] via SUBCUTANEOUS

## 2017-07-10 NOTE — Progress Notes (Signed)
[facility]/Trauma Progress Note  11 Days Post-Op   Assessment/Plan HTN Lymphedema Lower extremity ulcerations Erythema nodosum Hx of HIT Hypothyroidism Hx of Anemia  Diverticulitis with perforation - S/P laparotomy, sigmoid colectomy and end transverse colostomy, left salpingectomy, repair of cystotomy, Dr. Dema Severin, 10/15 - sm amt of stool in bag this am - TPN starting today due to malnutrition - will restart clears after TEE  Delirium - pt aware of this and daughter is very concerned - urine culture pending with CT scan results concerning for pyelonephritis and suspect UTI could be the cause - no neurological deficits so less concerning for stroke  Bacteriemia - ID following and appreciate their assistance  - 14d course of IV pip/tazo + PO antifungal per ID  - TEE pending, called cards today (10/26)  FEN: NPO until TEE VTE: SCD's, lovenox ID: + blood cultures for Strep, E. Coli and Candida: zosyn and diflucan  Foley: continue for 2 weeks total with removal on Monday 10/29 Follow up: Dr. Dema Severin  DISPO: Home health for IV abx, wound VAC, PT & OT. TEE pending. Urine culture pending    LOS: 11 days    Subjective:  CC: delirium  Daughter at beside and spoke to them in length about delirium. Pt is aware of this delirium. No neuro deficits, visual changes, slurred speech, weakness, numbness. Very mild abdominal pain. No dyspnea, SOB.   Objective: Vital signs in last 24 hours: Temp:  [98.4 F (36.9 C)-99.1 F (37.3 C)] 99.1 F (37.3 C) (10/26 0516) Pulse Rate:  [92-102] 102 (10/26 0516) Resp:  [19-20] 20 (10/26 0516) BP: (151-163)/(75-84) 157/75 (10/26 0516) SpO2:  [100 %] 100 % (10/26 0516) Last BM Date: 07/10/17  Intake/Output from previous day: 10/25 0701 - 10/26 0700 In: 0  Out: 2125 [Urine:2025; Drains:100] Intake/Output this shift: Total I/O In: -  Out: 10 [Drains:10]  PE: Physical Exam  Constitutional: She is oriented to person, place,  and time. She appears well-developed and well-nourished. No distress.  HENT:  Nose: Nose normal.  Mouth/Throat: Oropharynx is clear and moist. No oropharyngeal exudate.  Eyes: Pupils are equal, round, and reactive to light. Conjunctivae are normal. Right eye exhibits no discharge. Left eye exhibits no discharge.  Cardiovascular: Normal rate, regular rhythm and normal heart sounds.   Pulmonary/Chest: Effort normal and breath sounds normal. No respiratory distress. She has no wheezes. She has no rales.  Abdominal: Soft. Bowel sounds are normal. She exhibits distension (mild). There is tenderness (generalized mild). There is no guarding.  Wound vac in place, stoma pink, scant stool in bag, no gas  Musculoskeletal: Normal range of motion. She exhibits edema (1+ pitting of BLE).  Neurological: She is alert and oriented to person, place, and time. She has normal strength. She is not disoriented. No cranial nerve deficit or sensory deficit. GCS eye subscore is 4. GCS verbal subscore is 5. GCS motor subscore is 6.  Skin: Skin is warm and dry. She is not diaphoretic.  Nursing note and vitals reviewed.    Anti-infectives: Anti-infectives    Start     Dose/Rate Route Frequency Ordered Stop   07/06/17 1130  valACYclovir (VALTREX) tablet 2,000 mg     2,000 mg Oral 2 times daily 07/06/17 1129 07/06/17 2214   07/05/17 1000  fluconazole (DIFLUCAN) tablet 400 mg     400 mg Oral Daily 07/04/17 1110 07/16/17 0959   07/01/17 1030  fluconazole (DIFLUCAN) IVPB 400 mg  Status:  Discontinued     400 mg  100 mL/hr over 120 Minutes Intravenous Every 24 hours 06/30/17 0957 07/04/17 1110   06/30/17 1830  piperacillin-tazobactam (ZOSYN) IVPB 3.375 g     3.375 g 12.5 mL/hr over 240 Minutes Intravenous Every 8 hours 06/30/17 1106 07/15/17 2359   06/30/17 1115  piperacillin-tazobactam (ZOSYN) IVPB 3.375 g     3.375 g 100 mL/hr over 30 Minutes Intravenous  Once 06/30/17 1046 06/30/17 1412   06/30/17 1030  fluconazole  (DIFLUCAN) IVPB 800 mg     800 mg 200 mL/hr over 120 Minutes Intravenous  Once 06/30/17 0957 06/30/17 1537   06/29/17 2130  ciprofloxacin (CIPRO) IVPB 400 mg  Status:  Discontinued     400 mg 200 mL/hr over 60 Minutes Intravenous Every 12 hours 06/29/17 1106 06/30/17 1046   06/29/17 1730  metroNIDAZOLE (FLAGYL) IVPB 500 mg  Status:  Discontinued     500 mg 100 mL/hr over 60 Minutes Intravenous Every 8 hours 06/29/17 1106 06/30/17 1046   06/29/17 1000  metroNIDAZOLE (FLAGYL) IVPB 500 mg  Status:  Discontinued     500 mg 100 mL/hr over 60 Minutes Intravenous Every 8 hours 06/29/17 0915 06/29/17 1117   06/29/17 1000  ciprofloxacin (CIPRO) IVPB 400 mg  Status:  Discontinued     400 mg 200 mL/hr over 60 Minutes Intravenous Every 12 hours 06/29/17 0915 06/29/17 1117      Lab Results:   Recent Labs  07/09/17 1932 07/10/17 0429  WBC 14.0* 11.4*  HGB 7.6* 7.3*  HCT 25.1* 23.4*  PLT 503* 465*   BMET  Recent Labs  07/09/17 1932 07/10/17 0429  NA 135 136  K 4.1 3.9  CL 101 102  CO2 19* 22  GLUCOSE 79 91  BUN 6 6  CREATININE 1.25* 1.19*  CALCIUM 8.5* 8.4*   PT/INR No results for input(s): LABPROT, INR in the last 72 hours. CMP     Component Value Date/Time   NA 136 07/10/2017 0429   K 3.9 07/10/2017 0429   CL 102 07/10/2017 0429   CO2 22 07/10/2017 0429   GLUCOSE 91 07/10/2017 0429   BUN 6 07/10/2017 0429   CREATININE 1.19 (H) 07/10/2017 0429   CALCIUM 8.4 (L) 07/10/2017 0429   PROT 6.3 (L) 07/09/2017 0403   ALBUMIN 2.3 (L) 07/09/2017 0403   AST 30 07/09/2017 0403   ALT 28 07/09/2017 0403   ALKPHOS 83 07/09/2017 0403   BILITOT 1.2 07/09/2017 0403   GFRNONAA 47 (L) 07/10/2017 0429   GFRAA 55 (L) 07/10/2017 0429   Lipase     Component Value Date/Time   LIPASE 22 06/29/2017 0824    Studies/Results: Ct Abdomen Pelvis W Contrast  Result Date: 07/08/2017 CLINICAL DATA:  Abdominal distention. Evaluate for abscess. Evaluate for bladder injury. EXAM: CT ABDOMEN  AND PELVIS WITH CONTRAST TECHNIQUE: Multidetector CT imaging of the abdomen and pelvis was performed using the standard protocol following bolus administration of intravenous contrast. CONTRAST:  120mL ISOVUE-300 IOPAMIDOL (ISOVUE-300) INJECTION 61% COMPARISON:  06/29/2017 FINDINGS: Lower chest: Multi segment atelectasis in the lower lobes and right middle lobe. Trace pleural fluid. Hepatobiliary: New no evidence of abscess or mass. Coarse calcification in the right liver that is incidental.No evidence of biliary obstruction or stone. Pancreas: Unremarkable. Spleen: Unremarkable. Adrenals/Urinary Tract: Negative adrenals. Asymmetric striated nephrogram on the left. This asymmetric appearance favors pyelonephritis. Mild left pelviectasis without hydronephrosis or obstructive process. 250 cc of water-soluble contrast was injected into the bladder. No extravasation or filling defect is noted. Mild wall thickening best seen  along the dome, nonspecific but possibly related to renal findings. Stomach/Bowel: Descending colostomy with Hartmann's pouch. A surgical drain loops through the pelvis. Proximal small bowel is diffusely dilated with fluid levels, an overall ileus pattern. Negative for intra-abdominal abscess. There is small volume perihepatic ascites that does not appear loculated. The appendix is difficult to separate from neighboring bowel loops. Vascular/Lymphatic: No acute vascular abnormality. No mass or adenopathy. Reproductive:Hysterectomy.  Negative adnexae. Other: No pneumoperitoneum.  Anasarca. Musculoskeletal: No acute abnormalities. IMPRESSION: 1. Negative for intra-abdominal abscess. Small volume perihepatic ascites. 2. Diffuse small bowel distention with fluid levels compatible with ileus. 3. Specific bladder evaluation was requested. The urinary bladder was filled with contrast and there is no leak or hematoma. 4. Striated left nephrogram as seen with pyelonephritis. Please correlate with urinalysis.  5. Multi segment atelectasis. Electronically Signed   By: Monte Fantasia M.D.   On: 07/08/2017 13:03      Kalman Drape , [facility] 07/10/2017, 11:18 AM Pager: [phone] Consults: [phone] Mon-Fri 7:00 am-4:30 pm Sat-Sun 7:00 am-11:30 am

## 2017-07-10 NOTE — Progress Notes (Signed)
Physical Therapy Treatment Patient Details Name: Tiernan Millikin MRN: [id number] DOB: 1951/02/06 Today's Date: 07/10/2017    History of Present Illness Pt is a 66 y.o. female admitted with perforated diverticulitis. She underwent laparotomy/sigmoid colectomy 06-29-17. Pt postive for ileus after testing today (07/08/17). Pertinent PMH includes lymphedema, HTN, cellulitis.     PT Comments    Pt completed all activities today with no reports of pain.Reported BM this AM; pt interacted more in today's session.  Pt was able to ambulate further distance today. Treatment interrupted by PA (~66min). VC needed for proper technique and holds during all LE strengthening exercises. Elevated pt B LE due to increased swelling. Continue to work on strengthening and endurance next visit. Pt/dtr reports pt having hallucination/delirium overnight. Pt able to differentiate hallucination vs reality once reoriented. PA present in room during report of symptoms; PA ordered urinalysis to rule out UTI.  Follow Up Recommendations  Home health PT;Supervision/Assistance - 24 hour     Equipment Recommendations  None recommended by PT    Recommendations for Other Services       Precautions / Restrictions Precautions Precautions: Fall;Other (comment) Precaution Comments: wound vac to abd, JP drain, colostomy Restrictions Weight Bearing Restrictions: No    Mobility  Bed Mobility                  Transfers Overall transfer level: Needs assistance Equipment used: Rolling walker (2 wheeled) Transfers: Sit to/from Stand Sit to Stand: Min guard         General transfer comment: min guard for safety. vc for hand placement  Ambulation/Gait Ambulation/Gait assistance: Min guard Ambulation Distance (Feet): 730 Feet Assistive device: Rolling walker (2 wheeled) Gait Pattern/deviations: Step-through pattern;Decreased stride length;Trunk flexed     General Gait Details: VCs for posture during gait  training. No reports of fatigue.    Stairs            Wheelchair Mobility    Modified Rankin (Stroke Patients Only)       Balance Overall balance assessment: Needs assistance Sitting-balance support: No upper extremity supported;Feet supported Sitting balance-Leahy Scale: Good     Standing balance support: Bilateral upper extremity supported Standing balance-Leahy Scale: Good Standing balance comment: was able to ambulate with RW, step-through gait pattern, min guard                            Cognition Arousal/Alertness: Awake/alert Behavior During Therapy: WFL for tasks assessed/performed Overall Cognitive Status: Impaired/Different from baseline (Pt with delirium this am.  ) Area of Impairment: Orientation                 Orientation Level: Place             General Comments: Pt reports seeing people in her room who aren't there and reports leaving to go to work this am and ending up back in the hospital after she got her clothes from work.        Exercises General Exercises - Lower Extremity Ankle Circles/Pumps: AROM;Both;10 reps;Supine Long Arc Quad: AROM;Both;10 reps;Supine Hip ABduction/ADduction: AROM;Both;10 reps;Supine Straight Leg Raises: AROM;Both;10 reps Hip Flexion/Marching: AROM;Both;10 reps;Standing    General Comments        Pertinent Vitals/Pain Pain Assessment: No/denies pain    Home Living                      Prior Function  PT Goals (current goals can now be found in the care plan section) Acute Rehab PT Goals Patient Stated Goal: Return home PT Goal Formulation: With patient Potential to Achieve Goals: Good Progress towards PT goals: Progressing toward goals    Frequency    Min 3X/week      PT Plan Current plan remains appropriate    Co-evaluation              AM-PAC PT "6 Clicks" Daily Activity  Outcome Measure  Difficulty turning over in bed (including adjusting  bedclothes, sheets and blankets)?: A Lot Difficulty moving from lying on back to sitting on the side of the bed? : A Lot Difficulty sitting down on and standing up from a chair with arms (e.g., wheelchair, bedside commode, etc,.)?: A Little Help needed moving to and from a bed to chair (including a wheelchair)?: A Little Help needed walking in hospital room?: A Little Help needed climbing 3-5 steps with a railing? : A Lot 6 Click Score: 15    End of Session Equipment Utilized During Treatment: Gait belt Activity Tolerance: Patient tolerated treatment well Patient left: in chair;with family/visitor present;with call bell/phone within reach Nurse Communication: Mobility status PT Visit Diagnosis: Difficulty in walking, not elsewhere classified (R26.2);Pain     Time: 1791-5056 PT Time Calculation (min) (ACUTE ONLY): 38 min  Charges:  $Gait Training: 8-22 mins $Therapeutic Exercise: 8-22 mins                    G Codes:  Functional Assessment Tool Used: AM-PAC 6 Clicks Basic Mobility    Fransisca Connors, SPTA   Fransisca Connors 07/10/2017, 12:22 PM

## 2017-07-10 NOTE — Consult Note (Signed)
[facility] Nurse ostomy follow-up consultnote  Stoma type/location: Colostomy  Stomal assessment/size: Stoma pink and moist, 1 1/4 inch Peristomal assessment: intact skin surrounding Output: Noflatusor stool in the pouch Ostomy pouching: 1 piece convex pouch placed and dated Education provided: Pt confused this am. Wants to be taken to her office... Family has stepped out for a while. No one to educate. Applied barrier ring and one piece convex pouch. Supplies are in the closet for bedside nurse use, 3 pouches, 3 rings, and a pattern. WOC will continue to follow for further teaching sessions and vac change MWF. Enrolled patient in [employer] program: Yes, previously  [place] Nurse wound follow up Wound type:open abd surgical wound for wound vac  Measurement:see Wednesdays measurements Wound AST:MHDQQ red Drainage (amount, consistency, odor) mod amt pink drainage in tubing and canister Periwound: intact Dressing procedure/placement/frequency: Applied one piece of black foam, drape, barrier piece at distal end in skin fold, attached to 125 mmHg, immediate seal achieved. Extra drape at bedside, wound and ostomy are close and may need drape patched between the two. WOC will follow for vac and ostomy changes MWF.   Fara Olden, RN-C, WTA-C, [place] [facility] Treatment Associate Ostomy Care Associate

## 2017-07-10 NOTE — Progress Notes (Signed)
Initial Nutrition Assessment  DOCUMENTATION CODES:   Obesity unspecified  INTERVENTION:   -TPN management per pharmacy -RD will follow for diet advancement and supplement as appropriate  NUTRITION DIAGNOSIS:   Inadequate oral intake related to altered GI function as evidenced by NPO status.  GOAL:   Patient will meet greater than or equal to 90% of their needs  MONITOR:   Diet advancement, Labs, Weight trends, Skin, I & O's  REASON FOR ASSESSMENT:   Consult    ASSESSMENT:   Kebra Lowrimore is a 66 y.o. female with 11 day history of LLQ pain with associated fever, chills, drenching nightsweats was admitted on 10/15 for evaluation. Found to have fever of 103F, leukocytosis of 33K, CT showing large sigmoid diverticulitis with 9 x 6 cm paradiverticular abscess and prerectal space abscess measuring 3.7 x 3.7cm.   S/p  PROCEDURE 06/29/17:   1. Exploratory laparotomy 2. Sigmoid colectomy (Hartmann's procedure) with end transverse colostomy 3. Left salpingectomy 4. Repair of cystotomy  Labs reviewed.   10/22- PICC placed  Case discussed with RN; NPO for repeat TEE today. TPN will be initiated today. Pt and family have been tearful, due to concern for pt's hallucinations.   Spoke with pt and daughter at bedside. Pt daughter reports very poor oral intake for 1 week PTA. Pt and daughter share that pt generally has a good appetite, consuming 3 meals per day ("I eat whatever I want"). Per pt, she was tolerating both soft diet and clear liquids well, however, intake has been inconsistent and dependent on how she is feeling.   Pt reports UBW is around 185#. However, this is not consistent with wt hx. Pt daughter estimates that pt lost about 15# one week PTA, however, this is not consistent with wt hx, which reveals wt stability.   RD discussed with pt and daughter nutrition plan and how pt will receive the majority of her nutrition via TPN. Discussed importance of good nutrition to  promote healing.   Per pharmacy note, plan to initiate customized TPN (AA/CHO/Lipids 61/17/25) at 30 ml/hr. This provides 772 kcals and 44 grams of protein, meeting 47% of estimated kcal needs and 42% of estimated protein needs.  Albumin has a half-life of 21 days and is strongly affected by stress response and inflammatory process, therefore, do not expect to see an improvement in this lab value during acute hospitalization. When a patient presents with low albumin, it is likely skewed due to the acute inflammatory response. Note that low albumin is no longer used to diagnose malnutrition; [place] uses the new malnutrition guidelines published by the American Society for Parenteral and Enteral Nutrition (A.S.P.E.N.) and the Academy of Nutrition and Dietetics (AND).    Labs reviewed.  NUTRITION - FOCUSED PHYSICAL EXAM:    Most Recent Value  Orbital Region  No depletion  Upper Arm Region  No depletion  Thoracic and Lumbar Region  No depletion  Buccal Region  No depletion  Temple Region  No depletion  Clavicle Bone Region  No depletion  Clavicle and Acromion Bone Region  No depletion  Scapular Bone Region  No depletion  Dorsal Hand  No depletion  Patellar Region  No depletion  Anterior Thigh Region  No depletion  Posterior Calf Region  No depletion  Edema (RD Assessment)  Mild  Hair  Reviewed  Eyes  Reviewed  Mouth  Reviewed  Skin  Reviewed  Nails  Reviewed       Diet Order:  TPN ADULT (ION)  Diet NPO time specified Diet clear liquid Room service appropriate? Yes; Fluid consistency: Thin  EDUCATION NEEDS:   Education needs have been addressed  Skin:  Skin Assessment: Skin Integrity Issues: Skin Integrity Issues:: Wound VAC Wound Vac: abdominal incision  Last BM:  07/05/17 (10 ml output via colostomy)  Height:   Ht Readings from Last 1 Encounters:  06/29/17 5\' 3"  (1.6 m)    Weight:   Wt Readings from Last 1 Encounters:  07/06/17 217 lb 6.4 oz (98.6 kg)     Ideal Body Weight:  52.3 kg  BMI:  Body mass index is 38.51 kg/m.  Estimated Nutritional Needs:   Kcal:  1650-1850  Protein:  105-120 grams  Fluid:  > 1.6 L    Caliope Ruppert A. Jimmye Norman, RD, LDN, CDE Pager: [phone] After hours Pager: [phone]

## 2017-07-10 NOTE — Progress Notes (Signed)
[place] CONSULT NOTE   Pharmacy Consult for TPN Indication: post-operative ileus  Patient Measurements: Height: 5\' 3"  (160 cm) Weight: 217 lb 6.4 oz (98.6 kg) IBW/kg (Calculated) : 52.4 TPN AdjBW (KG): 62.7 Body mass index is 38.51 kg/m.  Assessment: 66 year old female with past medical history of LE lymphedema, HTN, diverticulitis, and LE cellulitis admitted 06/29/17 after 11 days history of LLQ abdominal pain and nausea found to have acute perforated sigmoid diverticulitis and associated pericolonic abscess and pelvic abscess status post laparotomy, sigmoid colectomy, and end transverse colostomy with left salpingectomy and repair of cystotomy on 06/29/17. CT of abdomen/pelvis on 10/25 showed no abscess but findings suggestive of ileus and pharmacy was consulted for TPN.   GI: No nausea or vomiting with clears. Pre-albumin 13.9. Albumin 2.3. Drain output 100 mL/24 hours. No stool output from colostomy yet.  Endo: No hx DM.  BG low 70-90s.  Insulin requirements in the past 24 hours: 0 units Lytes: Lytes within normal limits. Corr Ca 9.76.  Renal: SCr 1.19, est CrCl ~ 53.5 mL/min. Good UOP 0.9 mL/kg/hr. Oxybutynin. IVF- 1/2 NS + 20K at 50 mL/hr.  1/0 - 2L last 24 hours. Net + 2.5L for admission. Pulm: RA Cards: BP elevated (157/75), HR 102.  Hepatobil: LFTs wnl. TG elevated at 153.  Neuro: Pain 0-3, prn Hydromorphone, oxycodone. ID: Zosyn + Fluconazole for Ecoli, Streptococcus anginosis, and Candida tropicalis bacteremia. WBC trending down at 11.4. Afebrile. Per ID, plan to treat x2 weeks total with end date 07/15/17.   Best Practices: Protonix oral. TPN Access: PICC 07/06/17 TPN start date: 07/10/17 >>  Nutritional Goals (per RD recommendation on pending): KCal: 1500-1700 Protein: 90g *Using ABW for calculations  Current Nutrition:  Sips of clears TPN  Plan:  Initiate Customized TPN (AA/CHO/Lipids 61/17/25) at 30 ml/hr Change IVF to 1/2NS  at 20 mL/hr.   This provides 44 g of protein and 772 kCals per day meeting 49% of protein and 53% of kCal needs Add MVI and trace elements in TPN Add sensitive SSI every 6 hours and adjust as needed Monitor TPN labs F/U nutrition plan and ability to wean TPN  Sloan Leiter, PharmD, BCPS, BCCCP Clinical Pharmacist Clinical phone 07/10/2017 until 3:30PM- [phone] After hours, please call #[phone] 07/10/2017,8:43 AM

## 2017-07-11 LAB — MAGNESIUM: Magnesium: 2 mg/dL (ref 1.7–2.4)

## 2017-07-11 LAB — CBC WITH DIFFERENTIAL/PLATELET
Basophils Absolute: 0 10*3/uL (ref 0.0–0.1)
Basophils Relative: 0 %
Eosinophils Absolute: 0.3 10*3/uL (ref 0.0–0.7)
Eosinophils Relative: 3 %
HCT: 27.2 % — ABNORMAL LOW (ref 36.0–46.0)
Hemoglobin: 8.3 g/dL — ABNORMAL LOW (ref 12.0–15.0)
Lymphocytes Relative: 19 %
Lymphs Abs: 1.9 10*3/uL (ref 0.7–4.0)
MCH: 24.9 pg — ABNORMAL LOW (ref 26.0–34.0)
MCHC: 30.5 g/dL (ref 30.0–36.0)
MCV: 81.7 fL (ref 78.0–100.0)
Monocytes Absolute: 0.6 10*3/uL (ref 0.1–1.0)
Monocytes Relative: 6 %
Neutro Abs: 7.3 10*3/uL (ref 1.7–7.7)
Neutrophils Relative %: 72 %
Platelets: 613 10*3/uL — ABNORMAL HIGH (ref 150–400)
RBC: 3.33 MIL/uL — ABNORMAL LOW (ref 3.87–5.11)
RDW: 20.1 % — ABNORMAL HIGH (ref 11.5–15.5)
WBC: 10.1 10*3/uL (ref 4.0–10.5)

## 2017-07-11 LAB — COMPREHENSIVE METABOLIC PANEL
ALT: 26 U/L (ref 14–54)
AST: 22 U/L (ref 15–41)
Albumin: 2.6 g/dL — ABNORMAL LOW (ref 3.5–5.0)
Alkaline Phosphatase: 83 U/L (ref 38–126)
Anion gap: 12 (ref 5–15)
BUN: <5 mg/dL — ABNORMAL LOW (ref 6–20)
CO2: 22 mmol/L (ref 22–32)
Calcium: 8.9 mg/dL (ref 8.9–10.3)
Chloride: 104 mmol/L (ref 101–111)
Creatinine, Ser: 1.09 mg/dL — ABNORMAL HIGH (ref 0.44–1.00)
GFR calc Af Amer: >60 mL/min (ref >60)
GFR calc non Af Amer: 52 mL/min — ABNORMAL LOW (ref >60)
Glucose, Bld: 140 mg/dL — ABNORMAL HIGH (ref 65–99)
Potassium: 3.5 mmol/L (ref 3.5–5.1)
Sodium: 138 mmol/L (ref 135–145)
Total Bilirubin: 0.5 mg/dL (ref 0.3–1.2)
Total Protein: 7.3 g/dL (ref 6.5–8.1)

## 2017-07-11 LAB — GLUCOSE, CAPILLARY
Glucose-Capillary: 117 mg/dL — ABNORMAL HIGH (ref 65–99)
Glucose-Capillary: 127 mg/dL — ABNORMAL HIGH (ref 65–99)
Glucose-Capillary: 131 mg/dL — ABNORMAL HIGH (ref 65–99)
Glucose-Capillary: 138 mg/dL — ABNORMAL HIGH (ref 65–99)
Glucose-Capillary: 140 mg/dL — ABNORMAL HIGH (ref 65–99)

## 2017-07-11 LAB — URINE CULTURE: Culture: <10000 — AB

## 2017-07-11 LAB — PHOSPHORUS: Phosphorus: 3.2 mg/dL (ref 2.5–4.6)

## 2017-07-11 MED ORDER — SODIUM CHLORIDE 0.45 % IV SOLN: INTRAVENOUS | Status: DC

## 2017-07-11 MED ORDER — POTASSIUM CHLORIDE CRYS ER 20 MEQ PO TBCR
20.0000 meq | EXTENDED_RELEASE_TABLET | Freq: Once | ORAL | Status: AC
  Administered 2017-07-11: 20 meq via ORAL
  Filled 2017-07-11: qty 1

## 2017-07-11 MED ORDER — TRAVASOL 10 % IV SOLN
INTRAVENOUS | Status: AC
  Administered 2017-07-11: 19:00:00 via INTRAVENOUS
  Filled 2017-07-11: qty 744

## 2017-07-11 MED ORDER — TRAMADOL HCL 50 MG PO TABS: 50.0000 mg | ORAL_TABLET | Freq: Four times a day (QID) | ORAL | Status: DC | PRN

## 2017-07-11 MED ORDER — POTASSIUM CHLORIDE 10 MEQ/50ML IV SOLN
10.0000 meq | INTRAVENOUS | Status: DC
  Filled 2017-07-11 (×2): qty 50

## 2017-07-11 NOTE — Progress Notes (Addendum)
[place] CONSULT NOTE   Pharmacy Consult for TPN Indication: post-operative ileus  Patient Measurements: Height: 5\' 3"  (160 cm) Weight: 217 lb 6.4 oz (98.6 kg) IBW/kg (Calculated) : 52.4 TPN AdjBW (KG): 62.7 Body mass index is 38.51 kg/m.  Assessment: 66 year old female with past medical history of LE lymphedema, HTN, diverticulitis, and LE cellulitis admitted 06/29/17 after 11 days history of LLQ abdominal pain and nausea found to have acute perforated sigmoid diverticulitis and associated pericolonic abscess and pelvic abscess status post laparotomy, sigmoid colectomy, and end transverse colostomy with left salpingectomy and repair of cystotomy on 06/29/17. CT of abdomen/pelvis on 10/25 showed no abscess but findings suggestive of ileus and pharmacy was consulted for TPN.   GI: No nausea or vomiting with clears. Pre-albumin 13.9. Albumin 2.6. Drain output 20 mL/24 hours. No stool output from colostomy yet.  Endo: No hx DM.  CBGs in 130-140s with start of TPN. Insulin requirements in the past 24 hours: 2 units of sensitive SSI  Lytes: Lytes within normal limits, K trending down at 3.5. Corr Ca 10.  Renal: SCr 1.09, est CrCl ~ 58 mL/min. Great UOP 2.1 mL/kg/hr (auto-diuresis). Oxybutynin. I/O - 4L last 24 hours. Net -1.5L for admission. IVF- 1/2 NS + 20K at 20 mL/hr. Pulm: RA Cards: BP elevated (374-827M systolic, 78-67J diastolic), tachy.  Hepatobil: LFTs wnl. TG elevated at 153.  Neuro: Pain 0-3, prn Hydromorphone, oxycodone. ID: Zosyn + Fluconazole for Ecoli, Streptococcus anginosis, and Candida tropicalis bacteremia. WBC trending down at 11.4. Tmax 100.4 last 24 hours. Per ID, plan to treat x2 weeks total with end date 07/15/17.   Best Practices: Protonix oral. TPN Access: PICC (single lumen) 07/06/17 TPN start date: 07/10/17 >>  Nutritional Goals (per RD recommendation on 10/26): KCal: 1650-1850 Protein: 105-120g *Using TPN ABW (62.7 kg) for  calculations  Current Nutrition:  TPN Clear liquids (NPO after midnight for TEE on Sunday) Resource Breeze 1 TID (received 1 on 10/26 - 250 kcal, 9g protein), now on hold.  Plan:  Increase Customized TPN (AA/CHO/Lipids 62/15/22, adjusted for higher goals) to 50 mL/hr (goal rate 75 mL/hr).  Change 1/2NS to KVO at 1800.  This provides 74 g of protein and 1174 kCals per day meeting ~70% of needs Add MVI and trace elements in TPN Add sensitive SSI every 6 hours and adjust as needed Monitor TPN labs Give KCL 20 mEq po x1 today F/U nutrition plan and ability to wean TPN after TEE on Monday  *Consider restarting BP control agent (was on amlodipine PTA)  Sloan Leiter, PharmD, BCPS, BCCCP Clinical Pharmacist Clinical phone 07/11/2017 until 3:30PM- #[phone] After hours, please call #[phone] 07/11/2017,6:58 AM

## 2017-07-11 NOTE — Progress Notes (Signed)
Patient ID: Lindsay [name], female   DOB: [birth year], 66 y.o.   MRN: [id number] [facility] Surgery Progress Note:   12 Days Post-Op  Subjective: Mental status is alert but she is having some periods of disorientation that she is aware of.  She is oriented x 3.  At night she has had episodes of confusion.   Objective: Vital signs in last 24 hours: Temp:  [98.8 F (37.1 C)-100.4 F (38 C)] 99.5 F (37.5 C) (10/27 0403) Pulse Rate:  [101-108] 108 (10/27 0403) Resp:  [20] 20 (10/27 0403) BP: (110-173)/(74-95) 168/95 (10/27 0403) SpO2:  [96 %-100 %] 96 % (10/27 0403)  Intake/Output from previous day: 10/26 0701 - 10/27 0700 In: 964 [P.O.:250; I.V.:564; IV Piggyback:150] Out: 2229 [Urine:5025; Drains:20] Intake/Output this shift: No intake/output data recorded.  Physical Exam: Work of breathing is normal.  Minimal abdominal pain.    Lab Results:  Results for orders placed or performed during the hospital encounter of 06/29/17 (from the past 48 hour(s))  Urinalysis, Routine w reflex microscopic     Status: Abnormal   Collection Time: 07/09/17  6:54 PM  Result Value Ref Range   Color, Urine YELLOW YELLOW   APPearance CLOUDY (A) CLEAR   Specific Gravity, Urine 1.020 1.005 - 1.030   pH 5.0 5.0 - 8.0   Glucose, UA NEGATIVE NEGATIVE mg/dL   Hgb urine dipstick LARGE (A) NEGATIVE   Bilirubin Urine NEGATIVE NEGATIVE   Ketones, ur 80 (A) NEGATIVE mg/dL   Protein, ur 30 (A) NEGATIVE mg/dL   Nitrite NEGATIVE NEGATIVE   Leukocytes, UA SMALL (A) NEGATIVE   RBC / HPF TOO NUMEROUS TO COUNT 0 - 5 RBC/hpf   WBC, UA 6-30 0 - 5 WBC/hpf   Bacteria, UA FEW (A) NONE SEEN   Squamous Epithelial / LPF 0-5 (A) NONE SEEN   Mucus PRESENT    Uric Acid Crys, UA PRESENT   Glucose, capillary     Status: None   Collection Time: 07/09/17  7:04 PM  Result Value Ref Range   Glucose-Capillary 70 65 - 99 mg/dL   Comment 1 Notify RN   CBC     Status: Abnormal   Collection Time: 07/09/17  7:32 PM  Result  Value Ref Range   WBC 14.0 (H) 4.0 - 10.5 K/uL   RBC 3.07 (L) 3.87 - 5.11 MIL/uL   Hemoglobin 7.6 (L) 12.0 - 15.0 g/dL   HCT 25.1 (L) 36.0 - 46.0 %   MCV 81.8 78.0 - 100.0 fL   MCH 24.8 (L) 26.0 - 34.0 pg   MCHC 30.3 30.0 - 36.0 g/dL   RDW 19.7 (H) 11.5 - 15.5 %   Platelets 503 (H) 150 - 400 K/uL  Basic metabolic panel     Status: Abnormal   Collection Time: 07/09/17  7:32 PM  Result Value Ref Range   Sodium 135 135 - 145 mmol/L   Potassium 4.1 3.5 - 5.1 mmol/L   Chloride 101 101 - 111 mmol/L   CO2 19 (L) 22 - 32 mmol/L   Glucose, Bld 79 65 - 99 mg/dL   BUN 6 6 - 20 mg/dL   Creatinine, Ser 1.25 (H) 0.44 - 1.00 mg/dL   Calcium 8.5 (L) 8.9 - 10.3 mg/dL   GFR calc non Af Amer 44 (L) >60 mL/min   GFR calc Af Amer 52 (L) >60 mL/min    Comment: (NOTE) The eGFR has been calculated using the CKD EPI equation. This calculation has not been validated in  all clinical situations. eGFR's persistently <60 mL/min signify possible Chronic Kidney Disease.    Anion gap 15 5 - 15  Magnesium     Status: None   Collection Time: 07/10/17  4:29 AM  Result Value Ref Range   Magnesium 1.9 1.7 - 2.4 mg/dL  Phosphorus     Status: None   Collection Time: 07/10/17  4:29 AM  Result Value Ref Range   Phosphorus 3.3 2.5 - 4.6 mg/dL  CBC with Differential/Platelet     Status: Abnormal   Collection Time: 07/10/17  4:29 AM  Result Value Ref Range   WBC 11.4 (H) 4.0 - 10.5 K/uL   RBC 2.87 (L) 3.87 - 5.11 MIL/uL   Hemoglobin 7.3 (L) 12.0 - 15.0 g/dL   HCT 23.4 (L) 36.0 - 46.0 %   MCV 81.5 78.0 - 100.0 fL   MCH 25.4 (L) 26.0 - 34.0 pg   MCHC 31.2 30.0 - 36.0 g/dL   RDW 19.6 (H) 11.5 - 15.5 %   Platelets 465 (H) 150 - 400 K/uL   Neutrophils Relative % 75 %   Neutro Abs 8.6 (H) 1.7 - 7.7 K/uL   Lymphocytes Relative 16 %   Lymphs Abs 1.8 0.7 - 4.0 K/uL   Monocytes Relative 6 %   Monocytes Absolute 0.6 0.1 - 1.0 K/uL   Eosinophils Relative 3 %   Eosinophils Absolute 0.3 0.0 - 0.7 K/uL   Basophils  Relative 0 %   Basophils Absolute 0.0 0.0 - 0.1 K/uL  Basic metabolic panel     Status: Abnormal   Collection Time: 07/10/17  4:29 AM  Result Value Ref Range   Sodium 136 135 - 145 mmol/L   Potassium 3.9 3.5 - 5.1 mmol/L   Chloride 102 101 - 111 mmol/L   CO2 22 22 - 32 mmol/L   Glucose, Bld 91 65 - 99 mg/dL   BUN 6 6 - 20 mg/dL   Creatinine, Ser 1.19 (H) 0.44 - 1.00 mg/dL   Calcium 8.4 (L) 8.9 - 10.3 mg/dL   GFR calc non Af Amer 47 (L) >60 mL/min   GFR calc Af Amer 55 (L) >60 mL/min    Comment: (NOTE) The eGFR has been calculated using the CKD EPI equation. This calculation has not been validated in all clinical situations. eGFR's persistently <60 mL/min signify possible Chronic Kidney Disease.    Anion gap 12 5 - 15  Glucose, capillary     Status: Abnormal   Collection Time: 07/10/17 11:47 PM  Result Value Ref Range   Glucose-Capillary 131 (H) 65 - 99 mg/dL  Magnesium     Status: None   Collection Time: 07/11/17  6:27 AM  Result Value Ref Range   Magnesium 2.0 1.7 - 2.4 mg/dL  Phosphorus     Status: None   Collection Time: 07/11/17  6:27 AM  Result Value Ref Range   Phosphorus 3.2 2.5 - 4.6 mg/dL  CBC with Differential/Platelet     Status: Abnormal   Collection Time: 07/11/17  6:27 AM  Result Value Ref Range   WBC 10.1 4.0 - 10.5 K/uL   RBC 3.33 (L) 3.87 - 5.11 MIL/uL   Hemoglobin 8.3 (L) 12.0 - 15.0 g/dL   HCT 27.2 (L) 36.0 - 46.0 %   MCV 81.7 78.0 - 100.0 fL   MCH 24.9 (L) 26.0 - 34.0 pg   MCHC 30.5 30.0 - 36.0 g/dL   RDW 20.1 (H) 11.5 - 15.5 %   Platelets 613 (H) 150 -  400 K/uL   Neutrophils Relative % 72 %   Neutro Abs 7.3 1.7 - 7.7 K/uL   Lymphocytes Relative 19 %   Lymphs Abs 1.9 0.7 - 4.0 K/uL   Monocytes Relative 6 %   Monocytes Absolute 0.6 0.1 - 1.0 K/uL   Eosinophils Relative 3 %   Eosinophils Absolute 0.3 0.0 - 0.7 K/uL   Basophils Relative 0 %   Basophils Absolute 0.0 0.0 - 0.1 K/uL  Comprehensive metabolic panel     Status: Abnormal   Collection  Time: 07/11/17  6:27 AM  Result Value Ref Range   Sodium 138 135 - 145 mmol/L   Potassium 3.5 3.5 - 5.1 mmol/L   Chloride 104 101 - 111 mmol/L   CO2 22 22 - 32 mmol/L   Glucose, Bld 140 (H) 65 - 99 mg/dL   BUN <5 (L) 6 - 20 mg/dL   Creatinine, Ser 1.09 (H) 0.44 - 1.00 mg/dL   Calcium 8.9 8.9 - 10.3 mg/dL   Total Protein 7.3 6.5 - 8.1 g/dL   Albumin 2.6 (L) 3.5 - 5.0 g/dL   AST 22 15 - 41 U/L   ALT 26 14 - 54 U/L   Alkaline Phosphatase 83 38 - 126 U/L   Total Bilirubin 0.5 0.3 - 1.2 mg/dL   GFR calc non Af Amer 52 (L) >60 mL/min   GFR calc Af Amer >60 >60 mL/min    Comment: (NOTE) The eGFR has been calculated using the CKD EPI equation. This calculation has not been validated in all clinical situations. eGFR's persistently <60 mL/min signify possible Chronic Kidney Disease.    Anion gap 12 5 - 15  Glucose, capillary     Status: Abnormal   Collection Time: 07/11/17  6:38 AM  Result Value Ref Range   Glucose-Capillary 138 (H) 65 - 99 mg/dL    Radiology/Results: No results found.  Anti-infectives: Anti-infectives    Start     Dose/Rate Route Frequency Ordered Stop   07/06/17 1130  valACYclovir (VALTREX) tablet 2,000 mg     2,000 mg Oral 2 times daily 07/06/17 1129 07/06/17 2214   07/05/17 1000  fluconazole (DIFLUCAN) tablet 400 mg     400 mg Oral Daily 07/04/17 1110 07/16/17 0959   07/01/17 1030  fluconazole (DIFLUCAN) IVPB 400 mg  Status:  Discontinued     400 mg 100 mL/hr over 120 Minutes Intravenous Every 24 hours 06/30/17 0957 07/04/17 1110   06/30/17 1830  piperacillin-tazobactam (ZOSYN) IVPB 3.375 g     3.375 g 12.5 mL/hr over 240 Minutes Intravenous Every 8 hours 06/30/17 1106 07/15/17 2359   06/30/17 1115  piperacillin-tazobactam (ZOSYN) IVPB 3.375 g     3.375 g 100 mL/hr over 30 Minutes Intravenous  Once 06/30/17 1046 06/30/17 1412   06/30/17 1030  fluconazole (DIFLUCAN) IVPB 800 mg     800 mg 200 mL/hr over 120 Minutes Intravenous  Once 06/30/17 0957 06/30/17  1537   06/29/17 2130  ciprofloxacin (CIPRO) IVPB 400 mg  Status:  Discontinued     400 mg 200 mL/hr over 60 Minutes Intravenous Every 12 hours 06/29/17 1106 06/30/17 1046   06/29/17 1730  metroNIDAZOLE (FLAGYL) IVPB 500 mg  Status:  Discontinued     500 mg 100 mL/hr over 60 Minutes Intravenous Every 8 hours 06/29/17 1106 06/30/17 1046   06/29/17 1000  metroNIDAZOLE (FLAGYL) IVPB 500 mg  Status:  Discontinued     500 mg 100 mL/hr over 60 Minutes Intravenous Every 8 hours  06/29/17 0915 06/29/17 1117   06/29/17 1000  ciprofloxacin (CIPRO) IVPB 400 mg  Status:  Discontinued     400 mg 200 mL/hr over 60 Minutes Intravenous Every 12 hours 06/29/17 0915 06/29/17 1117      Assessment/Plan: Problem List: Patient Active Problem List   Diagnosis Date Noted  . Diverticulitis of colon with perforation 06/29/2017  . Diverticulitis 06/29/2017  . Erythema nodosum 01/31/2016  . Hypothyroidism 01/31/2016  . Adenopathy   . Wound infection   . Cellulitis of both lower extremities 01/28/2016  . Lymphedema of both lower extremities 01/28/2016  . Increased anion gap metabolic acidosis 69/99/6722  . Allergic angioedema 06/02/2014  . Ulcer of left lower leg ([place]) 06/01/2014  . History of HIT (heparin-induced thrombocytopenia)  09/24/2013  . Thrombocytopenia, unspecified ([place]) 09/21/2013  . Anemia 09/21/2013  . Drug induced neutropenia(288.03) 09/21/2013  . HTN (hypertension) 08/21/2013  . OSA (obstructive sleep apnea) 12/25/2008    Concern is more for these disorientation spells.  TEE pending on Monday.   12 Days Post-Op    LOS: 12 days   Matt B. Hassell Done, MD, [facility] [phone] beeper [phone]  07/11/2017 8:45 AM

## 2017-07-12 LAB — CBC WITH DIFFERENTIAL/PLATELET
Basophils Absolute: 0 10*3/uL (ref 0.0–0.1)
Basophils Relative: 0 %
Eosinophils Absolute: 0.5 10*3/uL (ref 0.0–0.7)
Eosinophils Relative: 5 %
HCT: 27.4 % — ABNORMAL LOW (ref 36.0–46.0)
Hemoglobin: 8.4 g/dL — ABNORMAL LOW (ref 12.0–15.0)
Lymphocytes Relative: 26 %
Lymphs Abs: 2.6 10*3/uL (ref 0.7–4.0)
MCH: 25.3 pg — ABNORMAL LOW (ref 26.0–34.0)
MCHC: 30.7 g/dL (ref 30.0–36.0)
MCV: 82.5 fL (ref 78.0–100.0)
Monocytes Absolute: 0.7 10*3/uL (ref 0.1–1.0)
Monocytes Relative: 7 %
Neutro Abs: 6.3 10*3/uL (ref 1.7–7.7)
Neutrophils Relative %: 62 %
Platelets: 610 10*3/uL — ABNORMAL HIGH (ref 150–400)
RBC: 3.32 MIL/uL — ABNORMAL LOW (ref 3.87–5.11)
RDW: 20.4 % — ABNORMAL HIGH (ref 11.5–15.5)
WBC: 10.1 10*3/uL (ref 4.0–10.5)

## 2017-07-12 LAB — BASIC METABOLIC PANEL
Anion gap: 10 (ref 5–15)
BUN: 6 mg/dL (ref 6–20)
CO2: 21 mmol/L — ABNORMAL LOW (ref 22–32)
Calcium: 8.9 mg/dL (ref 8.9–10.3)
Chloride: 108 mmol/L (ref 101–111)
Creatinine, Ser: 1.1 mg/dL — ABNORMAL HIGH (ref 0.44–1.00)
GFR calc Af Amer: >60 mL/min (ref >60)
GFR calc non Af Amer: 52 mL/min — ABNORMAL LOW (ref >60)
Glucose, Bld: 114 mg/dL — ABNORMAL HIGH (ref 65–99)
Potassium: 3.7 mmol/L (ref 3.5–5.1)
Sodium: 139 mmol/L (ref 135–145)

## 2017-07-12 LAB — GLUCOSE, CAPILLARY
Glucose-Capillary: 122 mg/dL — ABNORMAL HIGH (ref 65–99)
Glucose-Capillary: 122 mg/dL — ABNORMAL HIGH (ref 65–99)
Glucose-Capillary: 127 mg/dL — ABNORMAL HIGH (ref 65–99)
Glucose-Capillary: 139 mg/dL — ABNORMAL HIGH (ref 65–99)

## 2017-07-12 LAB — MAGNESIUM: Magnesium: 2 mg/dL (ref 1.7–2.4)

## 2017-07-12 LAB — PHOSPHORUS: Phosphorus: 3.3 mg/dL (ref 2.5–4.6)

## 2017-07-12 MED ORDER — POTASSIUM CHLORIDE CRYS ER 20 MEQ PO TBCR
20.0000 meq | EXTENDED_RELEASE_TABLET | Freq: Once | ORAL | Status: AC
  Administered 2017-07-12: 20 meq via ORAL
  Filled 2017-07-12: qty 1

## 2017-07-12 MED ORDER — TRAVASOL 10 % IV SOLN
INTRAVENOUS | Status: AC
  Administered 2017-07-12: 18:00:00 via INTRAVENOUS
  Filled 2017-07-12: qty 1116

## 2017-07-12 NOTE — Progress Notes (Signed)
[place] CONSULT NOTE   Pharmacy Consult for TPN Indication: post-operative ileus  Patient Measurements: Height: 5\' 3"  (160 cm) Weight: 217 lb 6.4 oz (98.6 kg) IBW/kg (Calculated) : 52.4 TPN AdjBW (KG): 62.7 Body mass index is 38.51 kg/m.  Assessment: 66 year old female with past medical history of LE lymphedema, HTN, diverticulitis, and LE cellulitis admitted 06/29/17 after 11 days history of LLQ abdominal pain and nausea found to have acute perforated sigmoid diverticulitis and associated pericolonic abscess and pelvic abscess status post laparotomy, sigmoid colectomy, and end transverse colostomy with left salpingectomy and repair of cystotomy on 06/29/17. CT of abdomen/pelvis on 10/25 showed no abscess but findings suggestive of ileus and pharmacy was consulted for TPN.   GI: No nausea or vomiting with clears. Pre-albumin 13.9. Albumin 2.6. Drain output 25 mL/24 hours. Colostomy output 425 mL/24 hours.  Endo: No hx DM.  CBGs in 114-140. Insulin requirements in the past 24 hours: 3 units of sensitive SSI  Lytes: Lytes within normal limits, K 3.7 (goal >4 with ileus), Mg 2. Corr Ca 10.  Renal: SCr 1.1 (stable), est CrCl ~ 58 mL/min. BUN wnl. Great UOP 1.6 mL/kg/hr. Oxybutynin. I/O - 4.2L last 24 hours. Net -5L for admission. IVF- 1/2 NS + 20K at 20 mL/hr. Pulm: RA Cards: BP elevated (791-505W systolic, 97-94I diastolic), tachy.  Hepatobil: LFTs wnl. TG elevated at 153.  Neuro: Concern for confusion/delirium -improving. Pain 0-8, prn Hydromorphone, oxycodone. ID: Zosyn + Fluconazole for Ecoli, Streptococcus anginosis, and Candida tropicalis bacteremia. WBC down to normal limits. Afebrile. Per ID, plan to treat x2 weeks total with end date 07/15/17.   Best Practices: Protonix oral. TPN Access: PICC (single lumen) 07/06/17 TPN start date: 07/10/17 >>  Nutritional Goals (per RD recommendation on 10/26): KCal: 1650-1850 Protein: 105-120g *Using TPN  ABW (62.7 kg) for calculations  Current Nutrition:  TPN NPO for TEE Resource Breeze 1 TID (received 2 on 10/27 - 500 kcal, 18g protein), now on hold for TEE.  Plan:  Increase Customized TPN (AA/CHO/Lipids 62/15/22)) to 75 mL/hr (goal rate).  Change 1/2NS to KVO at 1800.  This provides 112 g of protein and 1760 kCals per day meeting 100% of needs Add MVI and trace elements in TPN Add sensitive SSI every 6 hours and adjust as needed Monitor TPN labs Give KCL 20 mEq po x1 again today F/U nutrition plan and ability to wean TPN after TEE   *Consider restarting BP control agent (was on amlodipine PTA)  Sloan Leiter, PharmD, BCPS, BCCCP Clinical Pharmacist Clinical phone 07/12/2017 until 3:30PM- #[phone] After hours, please call #[phone] 07/12/2017,7:33 AM

## 2017-07-12 NOTE — Progress Notes (Signed)
Patient ID: Lindsay [name], female   DOB: 05-21-1951, 66 y.o.   MRN: [id number] [facility] Surgery Progress Note:   13 Days Post-Op  Subjective: Mental status is much clearer after resting better last night.   Objective: Vital signs in last 24 hours: Temp:  [99 F (37.2 C)] 99 F (37.2 C) (10/28 0412) Pulse Rate:  [97-100] 100 (10/28 0412) Resp:  [19-22] 19 (10/28 0412) BP: (160-170)/(66-74) 170/74 (10/28 0412) SpO2:  [97 %-100 %] 97 % (10/28 0412)  Intake/Output from previous day: 10/27 0701 - 10/28 0700 In: -  Out: 9233 [Urine:3775; Drains:25; Stool:425] Intake/Output this shift: No intake/output data recorded.  Physical Exam: Work of breathing is normal.  Ostomy appliance and vAC changed last night.  Pain controlled.    Lab Results:  Results for orders placed or performed during the hospital encounter of 06/29/17 (from the past 48 hour(s))  Culture, Urine     Status: Abnormal   Collection Time: 07/10/17  1:52 PM  Result Value Ref Range   Specimen Description URINE, CATHETERIZED    Special Requests NONE    Culture <10,000 COLONIES/mL INSIGNIFICANT GROWTH (A)    Report Status 07/11/2017 FINAL   Glucose, capillary     Status: Abnormal   Collection Time: 07/10/17 11:47 PM  Result Value Ref Range   Glucose-Capillary 131 (H) 65 - 99 mg/dL  Magnesium     Status: None   Collection Time: 07/11/17  6:27 AM  Result Value Ref Range   Magnesium 2.0 1.7 - 2.4 mg/dL  Phosphorus     Status: None   Collection Time: 07/11/17  6:27 AM  Result Value Ref Range   Phosphorus 3.2 2.5 - 4.6 mg/dL  CBC with Differential/Platelet     Status: Abnormal   Collection Time: 07/11/17  6:27 AM  Result Value Ref Range   WBC 10.1 4.0 - 10.5 K/uL   RBC 3.33 (L) 3.87 - 5.11 MIL/uL   Hemoglobin 8.3 (L) 12.0 - 15.0 g/dL   HCT 27.2 (L) 36.0 - 46.0 %   MCV 81.7 78.0 - 100.0 fL   MCH 24.9 (L) 26.0 - 34.0 pg   MCHC 30.5 30.0 - 36.0 g/dL   RDW 20.1 (H) 11.5 - 15.5 %   Platelets 613 (H) 150 - 400 K/uL    Neutrophils Relative % 72 %   Neutro Abs 7.3 1.7 - 7.7 K/uL   Lymphocytes Relative 19 %   Lymphs Abs 1.9 0.7 - 4.0 K/uL   Monocytes Relative 6 %   Monocytes Absolute 0.6 0.1 - 1.0 K/uL   Eosinophils Relative 3 %   Eosinophils Absolute 0.3 0.0 - 0.7 K/uL   Basophils Relative 0 %   Basophils Absolute 0.0 0.0 - 0.1 K/uL  Comprehensive metabolic panel     Status: Abnormal   Collection Time: 07/11/17  6:27 AM  Result Value Ref Range   Sodium 138 135 - 145 mmol/L   Potassium 3.5 3.5 - 5.1 mmol/L   Chloride 104 101 - 111 mmol/L   CO2 22 22 - 32 mmol/L   Glucose, Bld 140 (H) 65 - 99 mg/dL   BUN <5 (L) 6 - 20 mg/dL   Creatinine, Ser 1.09 (H) 0.44 - 1.00 mg/dL   Calcium 8.9 8.9 - 10.3 mg/dL   Total Protein 7.3 6.5 - 8.1 g/dL   Albumin 2.6 (L) 3.5 - 5.0 g/dL   AST 22 15 - 41 U/L   ALT 26 14 - 54 U/L   Alkaline Phosphatase 83  38 - 126 U/L   Total Bilirubin 0.5 0.3 - 1.2 mg/dL   GFR calc non Af Amer 52 (L) >60 mL/min   GFR calc Af Amer >60 >60 mL/min    Comment: (NOTE) The eGFR has been calculated using the CKD EPI equation. This calculation has not been validated in all clinical situations. eGFR's persistently <60 mL/min signify possible Chronic Kidney Disease.    Anion gap 12 5 - 15  Glucose, capillary     Status: Abnormal   Collection Time: 07/11/17  6:38 AM  Result Value Ref Range   Glucose-Capillary 138 (H) 65 - 99 mg/dL  Glucose, capillary     Status: Abnormal   Collection Time: 07/11/17 11:59 AM  Result Value Ref Range   Glucose-Capillary 117 (H) 65 - 99 mg/dL  Glucose, capillary     Status: Abnormal   Collection Time: 07/11/17  6:10 PM  Result Value Ref Range   Glucose-Capillary 140 (H) 65 - 99 mg/dL  Glucose, capillary     Status: Abnormal   Collection Time: 07/11/17 11:59 PM  Result Value Ref Range   Glucose-Capillary 127 (H) 65 - 99 mg/dL  Magnesium     Status: None   Collection Time: 07/12/17  2:35 AM  Result Value Ref Range   Magnesium 2.0 1.7 - 2.4 mg/dL   Phosphorus     Status: None   Collection Time: 07/12/17  2:35 AM  Result Value Ref Range   Phosphorus 3.3 2.5 - 4.6 mg/dL  CBC with Differential/Platelet     Status: Abnormal   Collection Time: 07/12/17  2:35 AM  Result Value Ref Range   WBC 10.1 4.0 - 10.5 K/uL   RBC 3.32 (L) 3.87 - 5.11 MIL/uL   Hemoglobin 8.4 (L) 12.0 - 15.0 g/dL   HCT 27.4 (L) 36.0 - 46.0 %   MCV 82.5 78.0 - 100.0 fL   MCH 25.3 (L) 26.0 - 34.0 pg   MCHC 30.7 30.0 - 36.0 g/dL   RDW 20.4 (H) 11.5 - 15.5 %   Platelets 610 (H) 150 - 400 K/uL   Neutrophils Relative % 62 %   Neutro Abs 6.3 1.7 - 7.7 K/uL   Lymphocytes Relative 26 %   Lymphs Abs 2.6 0.7 - 4.0 K/uL   Monocytes Relative 7 %   Monocytes Absolute 0.7 0.1 - 1.0 K/uL   Eosinophils Relative 5 %   Eosinophils Absolute 0.5 0.0 - 0.7 K/uL   Basophils Relative 0 %   Basophils Absolute 0.0 0.0 - 0.1 K/uL  Basic metabolic panel     Status: Abnormal   Collection Time: 07/12/17  2:35 AM  Result Value Ref Range   Sodium 139 135 - 145 mmol/L   Potassium 3.7 3.5 - 5.1 mmol/L   Chloride 108 101 - 111 mmol/L   CO2 21 (L) 22 - 32 mmol/L   Glucose, Bld 114 (H) 65 - 99 mg/dL   BUN 6 6 - 20 mg/dL   Creatinine, Ser 1.10 (H) 0.44 - 1.00 mg/dL   Calcium 8.9 8.9 - 10.3 mg/dL   GFR calc non Af Amer 52 (L) >60 mL/min   GFR calc Af Amer >60 >60 mL/min    Comment: (NOTE) The eGFR has been calculated using the CKD EPI equation. This calculation has not been validated in all clinical situations. eGFR's persistently <60 mL/min signify possible Chronic Kidney Disease.    Anion gap 10 5 - 15  Glucose, capillary     Status: Abnormal  Collection Time: 07/12/17  6:11 AM  Result Value Ref Range   Glucose-Capillary 122 (H) 65 - 99 mg/dL    Radiology/Results: No results found.  Anti-infectives: Anti-infectives    Start     Dose/Rate Route Frequency Ordered Stop   07/06/17 1130  valACYclovir (VALTREX) tablet 2,000 mg     2,000 mg Oral 2 times daily 07/06/17 1129  07/06/17 2214   07/05/17 1000  fluconazole (DIFLUCAN) tablet 400 mg     400 mg Oral Daily 07/04/17 1110 07/16/17 0959   07/01/17 1030  fluconazole (DIFLUCAN) IVPB 400 mg  Status:  Discontinued     400 mg 100 mL/hr over 120 Minutes Intravenous Every 24 hours 06/30/17 0957 07/04/17 1110   06/30/17 1830  piperacillin-tazobactam (ZOSYN) IVPB 3.375 g     3.375 g 12.5 mL/hr over 240 Minutes Intravenous Every 8 hours 06/30/17 1106 07/15/17 2359   06/30/17 1115  piperacillin-tazobactam (ZOSYN) IVPB 3.375 g     3.375 g 100 mL/hr over 30 Minutes Intravenous  Once 06/30/17 1046 06/30/17 1412   06/30/17 1030  fluconazole (DIFLUCAN) IVPB 800 mg     800 mg 200 mL/hr over 120 Minutes Intravenous  Once 06/30/17 0957 06/30/17 1537   06/29/17 2130  ciprofloxacin (CIPRO) IVPB 400 mg  Status:  Discontinued     400 mg 200 mL/hr over 60 Minutes Intravenous Every 12 hours 06/29/17 1106 06/30/17 1046   06/29/17 1730  metroNIDAZOLE (FLAGYL) IVPB 500 mg  Status:  Discontinued     500 mg 100 mL/hr over 60 Minutes Intravenous Every 8 hours 06/29/17 1106 06/30/17 1046   06/29/17 1000  metroNIDAZOLE (FLAGYL) IVPB 500 mg  Status:  Discontinued     500 mg 100 mL/hr over 60 Minutes Intravenous Every 8 hours 06/29/17 0915 06/29/17 1117   06/29/17 1000  ciprofloxacin (CIPRO) IVPB 400 mg  Status:  Discontinued     400 mg 200 mL/hr over 60 Minutes Intravenous Every 12 hours 06/29/17 0915 06/29/17 1117      Assessment/Plan: Problem List: Patient Active Problem List   Diagnosis Date Noted  . Diverticulitis of colon with perforation 06/29/2017  . Diverticulitis 06/29/2017  . Erythema nodosum 01/31/2016  . Hypothyroidism 01/31/2016  . Adenopathy   . Wound infection   . Cellulitis of both lower extremities 01/28/2016  . Lymphedema of both lower extremities 01/28/2016  . Increased anion gap metabolic acidosis 44/31/5400  . Allergic angioedema 06/02/2014  . Ulcer of left lower leg ([place]) 06/01/2014  . History of HIT  (heparin-induced thrombocytopenia)  09/24/2013  . Thrombocytopenia, unspecified ([place]) 09/21/2013  . Anemia 09/21/2013  . Drug induced neutropenia(288.03) 09/21/2013  . HTN (hypertension) 08/21/2013  . OSA (obstructive sleep apnea) 12/25/2008    Confusion better at present.  May be sleep deprivation but will observe.   13 Days Post-Op    LOS: 13 days   Matt B. Hassell Done, MD, [facility] [phone] beeper [phone]  07/12/2017 8:54 AM

## 2017-07-13 ENCOUNTER — Encounter (HOSPITAL_COMMUNITY): Payer: Self-pay | Admitting: *Deleted

## 2017-07-13 ENCOUNTER — Encounter (HOSPITAL_COMMUNITY): Admission: EM | Disposition: A | Discharge status: Home Health | Payer: Self-pay | Source: Home / Self Care

## 2017-07-13 ENCOUNTER — Other Ambulatory Visit: Payer: Self-pay | Admitting: Nurse Practitioner

## 2017-07-13 ENCOUNTER — Other Ambulatory Visit: Payer: Self-pay | Admitting: Physician Assistant

## 2017-07-13 ENCOUNTER — Inpatient Hospital Stay (HOSPITAL_COMMUNITY): Payer: BLUE CROSS/BLUE SHIELD

## 2017-07-13 DIAGNOSIS — I351 Nonrheumatic aortic (valve) insufficiency: Secondary | ICD-10-CM

## 2017-07-13 DIAGNOSIS — I340 Nonrheumatic mitral (valve) insufficiency: Secondary | ICD-10-CM

## 2017-07-13 HISTORY — PX: TEE WITHOUT CARDIOVERSION: SHX5443

## 2017-07-13 LAB — CBC WITH DIFFERENTIAL/PLATELET
Basophils Absolute: 0 10*3/uL (ref 0.0–0.1)
Basophils Relative: 0 %
Eosinophils Absolute: 0.6 10*3/uL (ref 0.0–0.7)
Eosinophils Relative: 7 %
HCT: 27.1 % — ABNORMAL LOW (ref 36.0–46.0)
Hemoglobin: 8.4 g/dL — ABNORMAL LOW (ref 12.0–15.0)
Lymphocytes Relative: 31 %
Lymphs Abs: 2.6 10*3/uL (ref 0.7–4.0)
MCH: 25.8 pg — ABNORMAL LOW (ref 26.0–34.0)
MCHC: 31 g/dL (ref 30.0–36.0)
MCV: 83.1 fL (ref 78.0–100.0)
Monocytes Absolute: 0.6 10*3/uL (ref 0.1–1.0)
Monocytes Relative: 7 %
Neutro Abs: 4.7 10*3/uL (ref 1.7–7.7)
Neutrophils Relative %: 55 %
Platelets: 626 10*3/uL — ABNORMAL HIGH (ref 150–400)
RBC: 3.26 MIL/uL — ABNORMAL LOW (ref 3.87–5.11)
RDW: 21.2 % — ABNORMAL HIGH (ref 11.5–15.5)
WBC: 8.5 10*3/uL (ref 4.0–10.5)

## 2017-07-13 LAB — COMPREHENSIVE METABOLIC PANEL
ALT: 23 U/L (ref 14–54)
AST: 22 U/L (ref 15–41)
Albumin: 2.5 g/dL — ABNORMAL LOW (ref 3.5–5.0)
Alkaline Phosphatase: 72 U/L (ref 38–126)
Anion gap: 9 (ref 5–15)
BUN: 20 mg/dL (ref 6–20)
CO2: 19 mmol/L — ABNORMAL LOW (ref 22–32)
Calcium: 8.7 mg/dL — ABNORMAL LOW (ref 8.9–10.3)
Chloride: 109 mmol/L (ref 101–111)
Creatinine, Ser: 1.13 mg/dL — ABNORMAL HIGH (ref 0.44–1.00)
GFR calc Af Amer: 58 mL/min — ABNORMAL LOW (ref >60)
GFR calc non Af Amer: 50 mL/min — ABNORMAL LOW (ref >60)
Glucose, Bld: 134 mg/dL — ABNORMAL HIGH (ref 65–99)
Potassium: 4.1 mmol/L (ref 3.5–5.1)
Sodium: 137 mmol/L (ref 135–145)
Total Bilirubin: 0.6 mg/dL (ref 0.3–1.2)
Total Protein: 6.8 g/dL (ref 6.5–8.1)

## 2017-07-13 LAB — PHOSPHORUS: Phosphorus: 4 mg/dL (ref 2.5–4.6)

## 2017-07-13 LAB — GLUCOSE, CAPILLARY
Glucose-Capillary: 126 mg/dL — ABNORMAL HIGH (ref 65–99)
Glucose-Capillary: 120 mg/dL — ABNORMAL HIGH (ref 65–99)
Glucose-Capillary: 120 mg/dL — ABNORMAL HIGH (ref 65–99)

## 2017-07-13 LAB — PREALBUMIN: Prealbumin: 20.8 mg/dL (ref 18–38)

## 2017-07-13 LAB — TRIGLYCERIDES: Triglycerides: 271 mg/dL — ABNORMAL HIGH (ref <150)

## 2017-07-13 LAB — MAGNESIUM: Magnesium: 2 mg/dL (ref 1.7–2.4)

## 2017-07-13 SURGERY — ECHOCARDIOGRAM, TRANSESOPHAGEAL
Anesthesia: Moderate Sedation

## 2017-07-13 MED ORDER — DIPHENHYDRAMINE HCL 50 MG/ML IJ SOLN
INTRAMUSCULAR | Status: AC
  Filled 2017-07-13: qty 1

## 2017-07-13 MED ORDER — MIDAZOLAM HCL 5 MG/ML IJ SOLN
INTRAMUSCULAR | Status: AC
  Filled 2017-07-13: qty 2

## 2017-07-13 MED ORDER — WHITE PETROLATUM EX OINT
TOPICAL_OINTMENT | CUTANEOUS | Status: AC
  Administered 2017-07-13: 19:00:00
  Filled 2017-07-13: qty 28.35

## 2017-07-13 MED ORDER — MIDAZOLAM HCL 10 MG/2ML IJ SOLN
INTRAMUSCULAR | Status: DC | PRN
  Administered 2017-07-13 (×2): 2 mg via INTRAVENOUS

## 2017-07-13 MED ORDER — TRAVASOL 10 % IV SOLN
INTRAVENOUS | Status: DC
  Filled 2017-07-13: qty 1116

## 2017-07-13 MED ORDER — FENTANYL CITRATE (PF) 100 MCG/2ML IJ SOLN
INTRAMUSCULAR | Status: DC | PRN
  Administered 2017-07-13: 25 ug via INTRAVENOUS

## 2017-07-13 MED ORDER — FENTANYL CITRATE (PF) 100 MCG/2ML IJ SOLN
INTRAMUSCULAR | Status: AC
  Filled 2017-07-13: qty 2

## 2017-07-13 MED ORDER — BUTAMBEN-TETRACAINE-BENZOCAINE 2-2-14 % EX AERO
INHALATION_SPRAY | CUTANEOUS | Status: DC | PRN
  Administered 2017-07-13: 2 via TOPICAL

## 2017-07-13 MED ORDER — TRAVASOL 10 % IV SOLN
INTRAVENOUS | Status: AC
  Administered 2017-07-13: 17:00:00 via INTRAVENOUS
  Filled 2017-07-13: qty 1116

## 2017-07-13 NOTE — Progress Notes (Signed)
OT Cancellation Note  Patient Details Name: Lindsay [name] MRN: [id number] DOB: 04-25-1951   Cancelled Treatment:     Pt having TEE. Will follow.  Malka So 07/13/2017, 9:20 AM  07/13/2017 Nestor Lewandowsky, OTR/L Pager: [phone]

## 2017-07-13 NOTE — Progress Notes (Addendum)
[place] CONSULT NOTE   Pharmacy Consult for TPN Indication:  Post-operative ileus  Patient Measurements: Height: 5\' 3"  (160 cm) Weight: 217 lb 6.4 oz (98.6 kg) IBW/kg (Calculated) : 52.4 TPN AdjBW (KG): 62.7 Body mass index is 38.51 kg/m.  Assessment:  54 YOF with PMH of LE lymphedema, HTN, diverticulitis, and LE cellulitis admitted 06/29/17 after 11 days history of LLQ abdominal pain and nausea, found to have acute perforated sigmoid diverticulitis and associated pericolonic abscess and pelvic abscess s/p laparotomy, sigmoid colectomy, and end transverse colostomy with left salpingectomy and repair of cystotomy on 06/29/17. CT of abdomen/pelvis on 07/09/17 showed no abscess but findings suggestive of ileus and pharmacy was consulted for TPN.   GI: prealbumin WNL at 20.8.  No N/V with clears. Colostomy O/P 30 mL.  PPI PO, Miralax Endo: no hx DM - CBGs well controlled Insulin requirements in the past 24 hours: 4 units sSSI  Lytes: all WNL except low CO2 (Phos trending up) Renal: SCr up 1.13, BUN WNL, UOP 0.6 ml/kg/hr, net -5.6L since admit.  1/2NS at 10 ml/hr Pulm: stable on RA Cards: BP elevated, HR controlled (Norvasc PTA) Hepatobil: LFTs / tbili WNL.  TG elevated at 271. Neuro: confusion/delirium improving. Pain 0-8, PRN Dilaudid/oxycodone. ID: Zosyn + Fluc for Ecoli, Streptococcus anginosis, and Candida tropicalis bacteremia. Afebrile, WBC WNL.  Per ID, plan to treat x2 weeks total with end date 07/15/17.  Best Practices: CHG TPN Access: PICC (single lumen) 07/06/17 TPN start date: 07/10/17  Nutritional Goals (per RD recommendation on 10/26):  1650-1850 kCal and 105-120gm protein per day *Using TPN ABW (62.7 kg) for calculations  Current Nutrition:  TPN NPO Resource Breeze 1 TID (received 0 yesterday, held for TEE - 500 kcal, 18g protein)   Plan:  Continue customized TPN (AA/CHO/lipids: 62/15/reduce to 17) at 75 ml/hr. Reduce lipid in  TPN d/t elevated TG. TPN provides 1670 kCal and 112gm of protein per day, meeting 100% of patient's needs Daily multivitamin and trace elements in TPN Continue sensitive SSI Q6H Adjust electrolyte contents in TPN: reduce phos slightly, change Cl:Ac to 1:2 F/U nutrition plan and ability to wean TPN after TEE  Consider restarting BP control agent (was on amlodipine PTA)   Elbert Polyakov D. Mina Marble, PharmD, BCPS Pager:  [phone] 07/13/2017, 9:46 AM

## 2017-07-13 NOTE — H&P (View-Only) (Signed)
Patient ID: Lindsay [name], female   DOB: 08-22-1951, 66 y.o.   MRN: [id number] [facility] Surgery Progress Note:   13 Days Post-Op  Subjective: Mental status is much clearer after resting better last night.   Objective: Vital signs in last 24 hours: Temp:  [99 F (37.2 C)] 99 F (37.2 C) (10/28 0412) Pulse Rate:  [97-100] 100 (10/28 0412) Resp:  [19-22] 19 (10/28 0412) BP: (160-170)/(66-74) 170/74 (10/28 0412) SpO2:  [97 %-100 %] 97 % (10/28 0412)  Intake/Output from previous day: 10/27 0701 - 10/28 0700 In: -  Out: 5465 [Urine:3775; Drains:25; Stool:425] Intake/Output this shift: No intake/output data recorded.  Physical Exam: Work of breathing is normal.  Ostomy appliance and vAC changed last night.  Pain controlled.    Lab Results:  Results for orders placed or performed during the hospital encounter of 06/29/17 (from the past 48 hour(s))  Culture, Urine     Status: Abnormal   Collection Time: 07/10/17  1:52 PM  Result Value Ref Range   Specimen Description URINE, CATHETERIZED    Special Requests NONE    Culture <10,000 COLONIES/mL INSIGNIFICANT GROWTH (A)    Report Status 07/11/2017 FINAL   Glucose, capillary     Status: Abnormal   Collection Time: 07/10/17 11:47 PM  Result Value Ref Range   Glucose-Capillary 131 (H) 65 - 99 mg/dL  Magnesium     Status: None   Collection Time: 07/11/17  6:27 AM  Result Value Ref Range   Magnesium 2.0 1.7 - 2.4 mg/dL  Phosphorus     Status: None   Collection Time: 07/11/17  6:27 AM  Result Value Ref Range   Phosphorus 3.2 2.5 - 4.6 mg/dL  CBC with Differential/Platelet     Status: Abnormal   Collection Time: 07/11/17  6:27 AM  Result Value Ref Range   WBC 10.1 4.0 - 10.5 K/uL   RBC 3.33 (L) 3.87 - 5.11 MIL/uL   Hemoglobin 8.3 (L) 12.0 - 15.0 g/dL   HCT 27.2 (L) 36.0 - 46.0 %   MCV 81.7 78.0 - 100.0 fL   MCH 24.9 (L) 26.0 - 34.0 pg   MCHC 30.5 30.0 - 36.0 g/dL   RDW 20.1 (H) 11.5 - 15.5 %   Platelets 613 (H) 150 - 400 K/uL    Neutrophils Relative % 72 %   Neutro Abs 7.3 1.7 - 7.7 K/uL   Lymphocytes Relative 19 %   Lymphs Abs 1.9 0.7 - 4.0 K/uL   Monocytes Relative 6 %   Monocytes Absolute 0.6 0.1 - 1.0 K/uL   Eosinophils Relative 3 %   Eosinophils Absolute 0.3 0.0 - 0.7 K/uL   Basophils Relative 0 %   Basophils Absolute 0.0 0.0 - 0.1 K/uL  Comprehensive metabolic panel     Status: Abnormal   Collection Time: 07/11/17  6:27 AM  Result Value Ref Range   Sodium 138 135 - 145 mmol/L   Potassium 3.5 3.5 - 5.1 mmol/L   Chloride 104 101 - 111 mmol/L   CO2 22 22 - 32 mmol/L   Glucose, Bld 140 (H) 65 - 99 mg/dL   BUN <5 (L) 6 - 20 mg/dL   Creatinine, Ser 1.09 (H) 0.44 - 1.00 mg/dL   Calcium 8.9 8.9 - 10.3 mg/dL   Total Protein 7.3 6.5 - 8.1 g/dL   Albumin 2.6 (L) 3.5 - 5.0 g/dL   AST 22 15 - 41 U/L   ALT 26 14 - 54 U/L   Alkaline Phosphatase 83  38 - 126 U/L   Total Bilirubin 0.5 0.3 - 1.2 mg/dL   GFR calc non Af Amer 52 (L) >60 mL/min   GFR calc Af Amer >60 >60 mL/min    Comment: (NOTE) The eGFR has been calculated using the CKD EPI equation. This calculation has not been validated in all clinical situations. eGFR's persistently <60 mL/min signify possible Chronic Kidney Disease.    Anion gap 12 5 - 15  Glucose, capillary     Status: Abnormal   Collection Time: 07/11/17  6:38 AM  Result Value Ref Range   Glucose-Capillary 138 (H) 65 - 99 mg/dL  Glucose, capillary     Status: Abnormal   Collection Time: 07/11/17 11:59 AM  Result Value Ref Range   Glucose-Capillary 117 (H) 65 - 99 mg/dL  Glucose, capillary     Status: Abnormal   Collection Time: 07/11/17  6:10 PM  Result Value Ref Range   Glucose-Capillary 140 (H) 65 - 99 mg/dL  Glucose, capillary     Status: Abnormal   Collection Time: 07/11/17 11:59 PM  Result Value Ref Range   Glucose-Capillary 127 (H) 65 - 99 mg/dL  Magnesium     Status: None   Collection Time: 07/12/17  2:35 AM  Result Value Ref Range   Magnesium 2.0 1.7 - 2.4 mg/dL   Phosphorus     Status: None   Collection Time: 07/12/17  2:35 AM  Result Value Ref Range   Phosphorus 3.3 2.5 - 4.6 mg/dL  CBC with Differential/Platelet     Status: Abnormal   Collection Time: 07/12/17  2:35 AM  Result Value Ref Range   WBC 10.1 4.0 - 10.5 K/uL   RBC 3.32 (L) 3.87 - 5.11 MIL/uL   Hemoglobin 8.4 (L) 12.0 - 15.0 g/dL   HCT 27.4 (L) 36.0 - 46.0 %   MCV 82.5 78.0 - 100.0 fL   MCH 25.3 (L) 26.0 - 34.0 pg   MCHC 30.7 30.0 - 36.0 g/dL   RDW 20.4 (H) 11.5 - 15.5 %   Platelets 610 (H) 150 - 400 K/uL   Neutrophils Relative % 62 %   Neutro Abs 6.3 1.7 - 7.7 K/uL   Lymphocytes Relative 26 %   Lymphs Abs 2.6 0.7 - 4.0 K/uL   Monocytes Relative 7 %   Monocytes Absolute 0.7 0.1 - 1.0 K/uL   Eosinophils Relative 5 %   Eosinophils Absolute 0.5 0.0 - 0.7 K/uL   Basophils Relative 0 %   Basophils Absolute 0.0 0.0 - 0.1 K/uL  Basic metabolic panel     Status: Abnormal   Collection Time: 07/12/17  2:35 AM  Result Value Ref Range   Sodium 139 135 - 145 mmol/L   Potassium 3.7 3.5 - 5.1 mmol/L   Chloride 108 101 - 111 mmol/L   CO2 21 (L) 22 - 32 mmol/L   Glucose, Bld 114 (H) 65 - 99 mg/dL   BUN 6 6 - 20 mg/dL   Creatinine, Ser 1.10 (H) 0.44 - 1.00 mg/dL   Calcium 8.9 8.9 - 10.3 mg/dL   GFR calc non Af Amer 52 (L) >60 mL/min   GFR calc Af Amer >60 >60 mL/min    Comment: (NOTE) The eGFR has been calculated using the CKD EPI equation. This calculation has not been validated in all clinical situations. eGFR's persistently <60 mL/min signify possible Chronic Kidney Disease.    Anion gap 10 5 - 15  Glucose, capillary     Status: Abnormal  Collection Time: 07/12/17  6:11 AM  Result Value Ref Range   Glucose-Capillary 122 (H) 65 - 99 mg/dL    Radiology/Results: No results found.  Anti-infectives: Anti-infectives    Start     Dose/Rate Route Frequency Ordered Stop   07/06/17 1130  valACYclovir (VALTREX) tablet 2,000 mg     2,000 mg Oral 2 times daily 07/06/17 1129  07/06/17 2214   07/05/17 1000  fluconazole (DIFLUCAN) tablet 400 mg     400 mg Oral Daily 07/04/17 1110 07/16/17 0959   07/01/17 1030  fluconazole (DIFLUCAN) IVPB 400 mg  Status:  Discontinued     400 mg 100 mL/hr over 120 Minutes Intravenous Every 24 hours 06/30/17 0957 07/04/17 1110   06/30/17 1830  piperacillin-tazobactam (ZOSYN) IVPB 3.375 g     3.375 g 12.5 mL/hr over 240 Minutes Intravenous Every 8 hours 06/30/17 1106 07/15/17 2359   06/30/17 1115  piperacillin-tazobactam (ZOSYN) IVPB 3.375 g     3.375 g 100 mL/hr over 30 Minutes Intravenous  Once 06/30/17 1046 06/30/17 1412   06/30/17 1030  fluconazole (DIFLUCAN) IVPB 800 mg     800 mg 200 mL/hr over 120 Minutes Intravenous  Once 06/30/17 0957 06/30/17 1537   06/29/17 2130  ciprofloxacin (CIPRO) IVPB 400 mg  Status:  Discontinued     400 mg 200 mL/hr over 60 Minutes Intravenous Every 12 hours 06/29/17 1106 06/30/17 1046   06/29/17 1730  metroNIDAZOLE (FLAGYL) IVPB 500 mg  Status:  Discontinued     500 mg 100 mL/hr over 60 Minutes Intravenous Every 8 hours 06/29/17 1106 06/30/17 1046   06/29/17 1000  metroNIDAZOLE (FLAGYL) IVPB 500 mg  Status:  Discontinued     500 mg 100 mL/hr over 60 Minutes Intravenous Every 8 hours 06/29/17 0915 06/29/17 1117   06/29/17 1000  ciprofloxacin (CIPRO) IVPB 400 mg  Status:  Discontinued     400 mg 200 mL/hr over 60 Minutes Intravenous Every 12 hours 06/29/17 0915 06/29/17 1117      Assessment/Plan: Problem List: Patient Active Problem List   Diagnosis Date Noted  . Diverticulitis of colon with perforation 06/29/2017  . Diverticulitis 06/29/2017  . Erythema nodosum 01/31/2016  . Hypothyroidism 01/31/2016  . Adenopathy   . Wound infection   . Cellulitis of both lower extremities 01/28/2016  . Lymphedema of both lower extremities 01/28/2016  . Increased anion gap metabolic acidosis 58/52/7782  . Allergic angioedema 06/02/2014  . Ulcer of left lower leg ([place]) 06/01/2014  . History of HIT  (heparin-induced thrombocytopenia)  09/24/2013  . Thrombocytopenia, unspecified ([place]) 09/21/2013  . Anemia 09/21/2013  . Drug induced neutropenia(288.03) 09/21/2013  . HTN (hypertension) 08/21/2013  . OSA (obstructive sleep apnea) 12/25/2008    Confusion better at present.  May be sleep deprivation but will observe.   13 Days Post-Op    LOS: 13 days   Matt B. Hassell Done, MD, [facility] [phone] beeper [phone]  07/12/2017 8:54 AM

## 2017-07-13 NOTE — Progress Notes (Signed)
Day of Surgery    CC: Lower quadrant abdominal pain  Subjective: She is just back from the TEE, results are below.  No vegetations.  She has a wound VAC in place.  She has fluid in her ostomy bag, but you cannot see through the pouch to examine the ostomy itself.  Objective: Vital signs in last 24 hours: Temp:  [98.4 F (36.9 C)-99.1 F (37.3 C)] 98.4 F (36.9 C) (10/29 0825) Pulse Rate:  [76-90] 85 (10/29 0940) Resp:  [16-34] 20 (10/29 0940) BP: (127-180)/(62-94) 151/77 (10/29 0940) SpO2:  [97 %-100 %] 98 % (10/29 0940) Last BM Date: 07/12/17 1662 IV 1450 urine 30 stool recorded. Afebrile, VSS Creatinine is stable Prealbumin 20.8 WBC is normal  Anemia stable  Intake/Output from previous day: 10/28 0701 - 10/29 0700 In: 1662.3 [I.V.:1362.3; IV Piggyback:300] Out: 1480 [Urine:1450; Stool:30] Intake/Output this shift: No intake/output data recorded.  General appearance: alert, cooperative and no distress Resp: clear to auscultation bilaterally GI: Soft, sore, wound VAC in place, drainage from the wound VAC is actually a little cloudy.  Ostomy bag is not clear so I cannot see the ostomy but there is fluid within the bag.  Positive bowel sounds.  Foley is still in place.  Lab Results:   Recent Labs  07/12/17 0235 07/13/17 0807  WBC 10.1 8.5  HGB 8.4* 8.4*  HCT 27.4* 27.1*  PLT 610* 626*    BMET  Recent Labs  07/12/17 0235 07/13/17 0807  NA 139 137  K 3.7 4.1  CL 108 109  CO2 21* 19*  GLUCOSE 114* 134*  BUN 6 20  CREATININE 1.10* 1.13*  CALCIUM 8.9 8.7*   PT/INR No results for input(s): LABPROT, INR in the last 72 hours.   Recent Labs Lab 07/09/17 0403 07/11/17 0627 07/13/17 0807  AST 30 22 22   ALT 28 26 23   ALKPHOS 83 83 72  BILITOT 1.2 0.5 0.6  PROT 6.3* 7.3 6.8  ALBUMIN 2.3* 2.6* 2.5*     Lipase     Component Value Date/Time   LIPASE 22 06/29/2017 0824      Medications: . Chlorhexidine Gluconate Cloth  6 each Topical Daily  .  feeding supplement  1 Container Oral TID BM  . fluconazole  400 mg Oral Daily  . insulin aspart  0-9 Units Subcutaneous Q6H  . oxybutynin  5 mg Oral TID  . pantoprazole  40 mg Oral Q supper  . polyethylene glycol  17 g Oral BID   Anti-infectives    Start     Dose/Rate Route Frequency Ordered Stop   07/06/17 1130  valACYclovir (VALTREX) tablet 2,000 mg     2,000 mg Oral 2 times daily 07/06/17 1129 07/06/17 2214   07/05/17 1000  fluconazole (DIFLUCAN) tablet 400 mg     400 mg Oral Daily 07/04/17 1110 07/16/17 0959   07/01/17 1030  fluconazole (DIFLUCAN) IVPB 400 mg  Status:  Discontinued     400 mg 100 mL/hr over 120 Minutes Intravenous Every 24 hours 06/30/17 0957 07/04/17 1110   06/30/17 1830  piperacillin-tazobactam (ZOSYN) IVPB 3.375 g     3.375 g 12.5 mL/hr over 240 Minutes Intravenous Every 8 hours 06/30/17 1106 07/15/17 2359   06/30/17 1115  piperacillin-tazobactam (ZOSYN) IVPB 3.375 g     3.375 g 100 mL/hr over 30 Minutes Intravenous  Once 06/30/17 1046 06/30/17 1412   06/30/17 1030  fluconazole (DIFLUCAN) IVPB 800 mg     800 mg 200 mL/hr over 120 Minutes  Intravenous  Once 06/30/17 0957 06/30/17 1537   06/29/17 2130  ciprofloxacin (CIPRO) IVPB 400 mg  Status:  Discontinued     400 mg 200 mL/hr over 60 Minutes Intravenous Every 12 hours 06/29/17 1106 06/30/17 1046   06/29/17 1730  metroNIDAZOLE (FLAGYL) IVPB 500 mg  Status:  Discontinued     500 mg 100 mL/hr over 60 Minutes Intravenous Every 8 hours 06/29/17 1106 06/30/17 1046   06/29/17 1000  metroNIDAZOLE (FLAGYL) IVPB 500 mg  Status:  Discontinued     500 mg 100 mL/hr over 60 Minutes Intravenous Every 8 hours 06/29/17 0915 06/29/17 1117   06/29/17 1000  ciprofloxacin (CIPRO) IVPB 400 mg  Status:  Discontinued     400 mg 200 mL/hr over 60 Minutes Intravenous Every 12 hours 06/29/17 0915 06/29/17 1117     . sodium chloride 10 mL/hr at 07/11/17 1806  . sodium chloride    . piperacillin-tazobactam (ZOSYN)  IV 3.375 g  (07/13/17 0501)  . TPN ADULT (ION) 75 mL/hr at 07/12/17 1738  . TPN ADULT (ION)      Assessment/Plan  Diverticulitis with perforation - S/P laparotomy, sigmoid colectomy and end transverse colostomy, left salpingectomy, repair of cystotomy, Dr. Dema Severin, 10/15 - sm amt of stool in bag this am - TPN starting today due to malnutrition - will restart clears after TEE  Delirium - improving - pt aware of this and daughter is very concerned - urine culture pending with CT scan results concerning for pyelonephritis and suspect UTI could be the cause - no neurological deficits so less concerning for stroke  Bacteriemia-strep species, E. Coli, and Candida tropicalis - ID following and appreciate their assistance Dr. Carlyle Basques 06/30/17, 07/06/17 - 14d course of IV pip/tazo 10/16 -10/31,   PO Diflucan 10/21- 07/16/17 (per ID)   - TEE pending, 07/13/17: Normal LV function, moderate aortic insufficiency, aneurysm of the anterior mitral valve leaflet, no vegetations  Malnutrition - TNA  Mild renal insuffiencey - stable Active problems: HTN - Norvasc is her only pre admit medicine she is taking. Lymphedema Lower extremity ulcerations Erythema nodosum Hx of HIT Hypothyroidism -  Anemia - stable  FEN: IV fluids/TNA/restart clear liquids - nothing recorded yesterday VTE: SCD's, Hx of HIT  ID: + blood cultures for Strep, E. Coli and Candida: zosyn and diflucan  Foley: continue for 2 weeks; Continue foley for 2 weeks until CT cysto complete/ with removal on Monday 10/29 - repair of cystotomy Follow up: Dr. Dema Severin    Plan:  I will look at the wounds and ostomy when they area changed today.  I will check on possible "CT for cysto." Antibiotic per ID, restart clears today.  Continue TNA till back on PO diet.       LOS: 14 days    Lindsay [name] 07/13/2017 [id number]

## 2017-07-13 NOTE — Progress Notes (Signed)
Patient left unit, will return upon completion of her TEE.

## 2017-07-13 NOTE — Progress Notes (Signed)
Physical Therapy Treatment Patient Details Name: Lindsay [name] MRN: [id number] DOB: [birth year] Today's Date: 07/13/2017    History of Present Illness Pt is a 66 y.o. female admitted with perforated diverticulitis. She underwent laparotomy/sigmoid colectomy 06-29-17. Pt postive for ileus after testing today (07/08/17). Pertinent PMH includes lymphedema, HTN, cellulitis.     PT Comments    Pt reported that she was tired and didn't sleep well last night. Pt required encouragement to participate in PT today.  Able to complete all activities today with no reports of pain. Pt ambulated with RW min guard for safety. Progressed to standing LE exercises today. VC needed for proper technique. Positioned pt in recliner with legs elevated. Continue with strengthening next session to help with overall mobility.   Follow Up Recommendations  Home health PT;Supervision/Assistance - 24 hour     Equipment Recommendations  None recommended by PT    Recommendations for Other Services       Precautions / Restrictions Precautions Precautions: Fall;Other (comment) Precaution Comments: wound vac to abd, colostomy Restrictions Weight Bearing Restrictions: No    Mobility  Bed Mobility Overal bed mobility: Needs Assistance Bed Mobility: Sit to Supine     Supine to sit: Min guard (sitting pivot) Sit to supine: Min assist   General bed mobility comments: assist for LEs into bed  Transfers Overall transfer level: Needs assistance Equipment used: Rolling walker (2 wheeled) Transfers: Sit to/from Stand Sit to Stand: Min guard         General transfer comment: increased time, good technique  Ambulation/Gait Ambulation/Gait assistance: Min guard Ambulation Distance (Feet): 730 Feet Assistive device: Rolling walker (2 wheeled) Gait Pattern/deviations: Step-through pattern;Decreased stride length;Trunk flexed Gait velocity: dec   General Gait Details: Pt was able to self correct posture while  ambulating around the hallway. No reports of fatique.   Stairs            Wheelchair Mobility    Modified Rankin (Stroke Patients Only)       Balance Overall balance assessment: Needs assistance Sitting-balance support: No upper extremity supported;Feet supported Sitting balance-Leahy Scale: Fair     Standing balance support: Bilateral upper extremity supported Standing balance-Leahy Scale: Poor Standing balance comment: relies on at least one hand on the walker during pericare                            Cognition Arousal/Alertness: Awake/alert Behavior During Therapy: WFL for tasks assessed/performed Overall Cognitive Status: Within Functional Limits for tasks assessed                                 General Comments: hallucinations have resolved      Exercises General Exercises - Lower Extremity Hip ABduction/ADduction: AROM;Both;10 reps;Standing (Hip extension; 10 reps; Both; standing) Hip Flexion/Marching: AROM;Both;10 reps;Standing Mini-Sqauts: AROM;10 reps    General Comments        Pertinent Vitals/Pain Pain Assessment: Faces Faces Pain Scale: Hurts little more Pain Location: abdomen Pain Descriptors / Indicators: Discomfort Pain Intervention(s): Monitored during session;Repositioned    Home Living                      Prior Function            PT Goals (current goals can now be found in the care plan section) Acute Rehab PT Goals Patient Stated Goal: Return home PT Goal Formulation:  With patient Potential to Achieve Goals: Good Progress towards PT goals: Progressing toward goals    Frequency    Min 3X/week      PT Plan Current plan remains appropriate    Co-evaluation              AM-PAC PT "6 Clicks" Daily Activity  Outcome Measure  Difficulty turning over in bed (including adjusting bedclothes, sheets and blankets)?: A Lot Difficulty moving from lying on back to sitting on the side of  the bed? : A Lot Difficulty sitting down on and standing up from a chair with arms (e.g., wheelchair, bedside commode, etc,.)?: A Little Help needed moving to and from a bed to chair (including a wheelchair)?: A Little Help needed walking in hospital room?: A Little Help needed climbing 3-5 steps with a railing? : A Lot 6 Click Score: 15    End of Session Equipment Utilized During Treatment: Gait belt Activity Tolerance: Patient tolerated treatment well Patient left: in chair;with family/visitor present;with call bell/phone within reach Nurse Communication: Mobility status PT Visit Diagnosis: Difficulty in walking, not elsewhere classified (R26.2);Pain     Time: 1240-1314 PT Time Calculation (min) (ACUTE ONLY): 34 min  Charges:  $Gait Training: 8-22 mins $Therapeutic Exercise: 8-22 mins                    G Codes:  Functional Assessment Tool Used: AM-PAC 6 Clicks Basic Mobility    Fransisca Connors, SPTA    Fransisca Connors 07/13/2017, 4:03 PM

## 2017-07-13 NOTE — Progress Notes (Signed)
Patient returned to unit. TEE was completed. Patient currently resting, will continue to monitor.

## 2017-07-13 NOTE — Consult Note (Addendum)
[facility] Nurse ostomy follow up Stoma type/location: LLQ Colostomy, pouch change today.  Stomal assessment/size:  1 1/4" round, pink patent and producing soft brown stool Peristomal assessment: intact midline abdominal wound with NPWT (VAC) dressing.  Change both today Treatment options for stomal/peristomal skin: barrier ring and 1 piece convex pouch Output soft brown stool Ostomy pouching: 1pc. convex Education provided: Patient is medicated for pain.  She does not participate in ostomy care today.  No family at bedside.  Enrolled patient in [place] Start Discharge program: Yes per previous [place] note  [facility]on [place] Nurse wound follow up Wound type:Midline abdominal surgical wound Measurement: 19 cm x 3.6 cm x 1.8 cm  Wound XBM:WUXLK red and granulating Drainage (amount, consistency, odor) minimal serosanguinous in canister. No odor Periwound:stoma at 3 o'clock Dressing procedure/placement/frequency: Cleanse wound to abdomen with NS.  Fill with black granufoam dressing and drape.  Seal was not immediately obtained and new machine is ordered.  Another member of the [place] team assessed situation and reinforced dressing and seal was obtained.  Change Mon/Wed/Fri. [place] team will follow.  Domenic Moras RN BSN [place] Pager [phone]

## 2017-07-13 NOTE — Interval H&P Note (Signed)
History and Physical Interval Note:  07/13/2017 8:45 AM  Lindsay [name]  has presented today for surgery, with the diagnosis of bacteremia  The various methods of treatment have been discussed with the patient and family. After consideration of risks, benefits and other options for treatment, the patient has consented to  Procedure(s): TRANSESOPHAGEAL ECHOCARDIOGRAM (TEE) (N/A) as a surgical intervention .  The patient's history has been reviewed, patient examined, no change in status, stable for surgery.  I have reviewed the patient's chart and labs.  Questions were answered to the patient's satisfaction.     Kirk Ruths

## 2017-07-13 NOTE — Progress Notes (Signed)
Focus of session on ADL training. Pt with decreased activity tolerance, but good participation.   07/13/17 1500  OT Visit Information  Last OT Received On 07/13/17  Assistance Needed +1  History of Present Illness Pt is a 66 y.o. female admitted with perforated diverticulitis. She underwent laparotomy/sigmoid colectomy 06-29-17. Pt postive for ileus after testing today (07/08/17). Pertinent PMH includes lymphedema, HTN, cellulitis.   Precautions  Precautions Fall;Other (comment)  Precaution Comments wound vac to abd, colostomy  Pain Assessment  Pain Assessment Faces  Faces Pain Scale 4  Pain Location abdomen  Pain Descriptors / Indicators Discomfort  Pain Intervention(s) Monitored during session;Repositioned  Cognition  Arousal/Alertness Awake/alert  Behavior During Therapy WFL for tasks assessed/performed  Overall Cognitive Status Within Functional Limits for tasks assessed  General Comments hallucinations have resolved  ADL  Overall ADL's  Needs assistance/impaired  Grooming Wash/dry hands;Wash/dry face;Sitting;Set up  Upper Body Bathing Minimal assistance;Sitting  Upper Body Bathing Details (indicate cue type and reason) sitting edge of chair assist for back  Lower Body Bathing Maximal assistance;Sit to/from stand  Lower Body Bathing Details (indicate cue type and reason) educated in use of long handled bath sponge, pt has a bath brush at home  Upper Body Dressing  Set up;Sitting  Lower Body Dressing Maximal assistance;Sitting/lateral leans  Lower Body Dressing Details (indicate cue type and reason) socks  Toileting- Clothing Manipulation and Hygiene Min guard;Sit to/from stand  Toileting - Clothing Manipulation Details (indicate cue type and reason) pericare associated with bath  Functional mobility during ADLs Min guard;Rolling walker  Bed Mobility  Overal bed mobility Needs Assistance  Bed Mobility Sit to Supine  Sit to supine Min assist  General bed mobility comments  assist for LEs into bed  Balance  Overall balance assessment Needs assistance  Sitting balance-Leahy Scale Fair  Standing balance-Leahy Scale Poor  Standing balance comment relies on at least one hand on the walker during pericare  Transfers  Overall transfer level Needs assistance  Equipment used Rolling walker (2 wheeled)  Transfers Sit to/from Stand  Sit to Stand Min guard  General transfer comment increased time, good technique  OT - End of Session  Equipment Utilized During Treatment Rolling walker  Activity Tolerance Patient tolerated treatment well  Patient left in bed;with call bell/phone within reach;with nursing/sitter in room (IV nurse changing dressing)  OT Assessment/Plan  OT Plan Discharge plan remains appropriate  OT Visit Diagnosis Unsteadiness on feet (R26.81);Pain  OT Frequency (ACUTE ONLY) Min 2X/week  Follow Up Recommendations Home health OT  OT Equipment None recommended by OT  AM-PAC OT "6 Clicks" Daily Activity Outcome Measure  Help from another person eating meals? 4  Help from another person taking care of personal grooming? 3  Help from another person toileting, which includes using toliet, bedpan, or urinal? 3  Help from another person bathing (including washing, rinsing, drying)? 2  Help from another person to put on and taking off regular upper body clothing? 4  Help from another person to put on and taking off regular lower body clothing? 2  6 Click Score 18  ADL G Code Conversion CK  OT Goal Progression  Progress towards OT goals Progressing toward goals  Acute Rehab OT Goals  Patient Stated Goal Return home  OT Goal Formulation With patient/family  Time For Goal Achievement 07/14/17  Potential to Achieve Goals Good  OT Time Calculation  OT Start Time (ACUTE ONLY) 1450  OT Stop Time (ACUTE ONLY) 1528  OT Time Calculation (min)  38 min  OT General Charges  $OT Visit 1 Visit  OT Treatments  $Self Care/Home Management  38-52 mins   07/13/2017 Nestor Lewandowsky, OTR/L Pager: [phone]

## 2017-07-13 NOTE — Progress Notes (Signed)
  Echocardiogram Echocardiogram Transesophageal has been performed.  Lindsay [name] 07/13/2017, 10:53 AM

## 2017-07-13 NOTE — CV Procedure (Addendum)
    Transesophageal Echocardiogram Note  Lindsay [name] [id number] 1951-05-26  Procedure: Transesophageal Echocardiogram Indications: Bacteremai/possible aortic valve vegetation on transthoracic echo  Procedure Details Consent: Obtained Time Out: Verified patient identification, verified procedure, site/side was marked, verified correct patient position, special equipment/implants available, Radiology Safety Procedures followed,  medications/allergies/relevent history reviewed, required imaging and test results available.  Performed  Medications:  During this procedure the patient is administered a total of Versed 4 mg and Fentanyl 25 mcg  to achieve and maintain moderate conscious sedation.  The patient's heart rate, blood pressure, and oxygen saturation are monitored continuously during the procedure. The period of conscious sedation is 16 minutes, of which I was present face-to-face 100% of this time.  Normal LV systolic function; moderate AI; aneurysm around anterior MV leaflet; no vegetations.   Complications: No apparent complications Patient did tolerate procedure well.  Kirk Ruths, MD   I have reviewed TEE images with Dr Aundra Dubin and Dr Johnsie Cancel. Pt appears to have an aneurysm of the intervalvular fibrosa and moderate AI; although no vegetations identified, this could be sequelae of prior SBE/abscess; suggest full cardiology consult (patient will at least need fu echos in the future) and cardiac gated CT to further assess.  Kirk Ruths, MD

## 2017-07-13 NOTE — Consult Note (Signed)
Cardiology Consultation:   Patient ID: Lindsay [name]; [id number]; 1951/01/15   Admit date: 06/29/2017 Date of Consult: 07/13/2017  Primary Care Provider: Merrilee Seashore, MD Primary Cardiologist: new - Dr. Acie Fredrickson Primary Electrophysiologist:     Patient Profile:   Lindsay [name] is a 66 y.o. female with a hx of diverticulitis s/p sigmoid colectomy and end transverse colostomy and positive blood cultures, HTN, hx of right lower extremity cellulitis and nonhealing wound who is being seen today for the evaluation of abnormal echocardiogram at the request of Dr. Creig Hines.  History of Present Illness:   Lindsay [name] presented to [facility] with 11 day history of LLQ abdominal pain, fever/chills, and nausea. She was found to have diverticulitis and ultimately underwent sigmoid colectomy and end transverse colostomy on 06/29/17 with left salpingectomy and repair of cystotomy. She had positive blood cultures with strep, E coli, and candida. ID was consulted for ABX guidance. As part of her workup, she underwent TTE that showed normal LV function, grade 1 DD, and a mobile density on the aortic valve. TEE was performed 07/13/17 that showed no evidence of aortic valve vegetation, but showed aneurysm of intervalvular fibrosa thought to be a sequelae of prior endocarditis. Cardiology was formally consulted for serial TEE and cardiac CT to further evaluation aneurysm.   On my interview, she denies previous cardiology problems and does not currently follow with cardiology. She has not experienced chest pain, shortness of breath, palpitations, or leg swelling. She was previously worked up for PAD with Dr. Gwenlyn Found (2015), but she was diagnosed with cellulitis. She is now abdominal pain free following her surgery. She is alert, oriented, and answers all questions on exam.   Past Medical History:  Diagnosis Date  . Cellulitis   . Hypertension   . Lower extremity ulceration ([place])   . Lymphedema   . Non-healing  wound of lower extremity     Past Surgical History:  Procedure Laterality Date  . ABDOMINAL HYSTERECTOMY    . APPLICATION OF WOUND VAC Right 08/23/2013   Procedure: APPLICATION OF WOUND VAC;  Surgeon: Mcarthur Rossetti, MD;  Location: [place];  Service: Orthopedics;  Laterality: Right;  . COLOSTOMY N/A 06/29/2017   Procedure: COLOSTOMY;  Surgeon: Ileana Roup, MD;  Location: [place];  Service: General;  Laterality: N/A;  . I&D EXTREMITY Right 08/23/2013   Procedure: I&D Right Leg Abscess;  Surgeon: Mcarthur Rossetti, MD;  Location: [place];  Service: Orthopedics;  Laterality: Right;  . I&D EXTREMITY Right 08/27/2013   Procedure: REPEAT IRRIGATION AND DEBRIDEMENT OF RIGHT LEG WOUND;  Surgeon: Mcarthur Rossetti, MD;  Location: [place];  Service: Orthopedics;  Laterality: Right;  . I&D EXTREMITY Right 08/30/2013   Procedure: Repeat IRRIGATION AND DEBRIDEMENT Right leg, skin graft;  Surgeon: Mcarthur Rossetti, MD;  Location: [place];  Service: Orthopedics;  Laterality: Right;  . LAPAROTOMY N/A 06/29/2017   Procedure: EXPLORATORY LAPAROTOMY;  Surgeon: Ileana Roup, MD;  Location: [place];  Service: General;  Laterality: N/A;     Home Medications:  Prior to Admission medications   Medication Sig Start Date End Date Taking? Authorizing Provider  acetaminophen (TYLENOL) 325 MG tablet Take 2 tablets (650 mg total) by mouth every 6 (six) hours as needed for mild pain or fever. 06/03/14  Yes Delfina Redwood, MD  amLODipine (NORVASC) 5 MG tablet Take 5 mg by mouth daily.   Yes [provider]  Multiple Vitamins-Minerals (CENTRUM ADULTS PO) Take 1 tablet by mouth daily.  Yes [provider]  clobetasol cream (TEMOVATE) 8.41 % Apply 1 application topically daily. 01/08/16   [provider]  colchicine 0.6 MG tablet Take 1 tablet (0.6 mg total) by mouth 2 (two) times daily. Patient not taking: Reported on 06/29/2017 02/05/16   Rama, Venetia Maxon, MD    feeding supplement, ENSURE ENLIVE, (ENSURE ENLIVE) LIQD Take 237 mLs by mouth 2 (two) times daily between meals. Patient not taking: Reported on 06/29/2017 02/05/16   Rama, Venetia Maxon, MD  oxyCODONE (OXY IR/ROXICODONE) 5 MG immediate release tablet Take 1-2 tablets (5-10 mg total) by mouth every 4 (four) hours as needed for moderate pain. Patient not taking: Reported on 06/29/2017 02/05/16   Rama, Venetia Maxon, MD  pantoprazole (PROTONIX) 40 MG tablet Take 1 tablet (40 mg total) by mouth daily. Patient not taking: Reported on 06/29/2017 02/05/16   Rama, Venetia Maxon, MD  polyethylene glycol (MIRALAX / GLYCOLAX) packet Take 17 g by mouth daily as needed for mild constipation. Patient not taking: Reported on 06/29/2017 02/05/16   Rama, Venetia Maxon, MD  potassium iodide (SSKI) 1 GM/ML solution Take by mouth 3 (three) times daily. Started at 5 drops, then increase 1 drop daily till patient reaches 15 drops. Pt is up to 9 drops daily as of 01/28/16    [provider]  predniSONE (DELTASONE) 20 MG tablet Take 2 tablets (40 mg total) by mouth daily with breakfast. Patient not taking: Reported on 06/29/2017 02/05/16   Rama, Venetia Maxon, MD  tacrolimus (PROTOPIC) 0.1 % ointment Apply topically 2 (two) times daily. Patient not taking: Reported on 06/29/2017 02/05/16   Rama, Venetia Maxon, MD    Inpatient Medications: Scheduled Meds: . Chlorhexidine Gluconate Cloth  6 each Topical Daily  . feeding supplement  1 Container Oral TID BM  . fluconazole  400 mg Oral Daily  . insulin aspart  0-9 Units Subcutaneous Q6H  . oxybutynin  5 mg Oral TID  . pantoprazole  40 mg Oral Q supper  . polyethylene glycol  17 g Oral BID   Continuous Infusions: . sodium chloride 10 mL/hr at 07/11/17 1806  . sodium chloride    . piperacillin-tazobactam (ZOSYN)  IV 3.375 g (07/13/17 1427)  . TPN ADULT (ION) 75 mL/hr at 07/12/17 1738  . TPN ADULT (ION)     PRN Meds: acetaminophen **OR** acetaminophen, diphenhydrAMINE  **OR** diphenhydrAMINE, diphenhydrAMINE, hydrALAZINE, HYDROmorphone (DILAUDID) injection, ondansetron **OR** ondansetron (ZOFRAN) IV, oxyCODONE, sodium chloride flush, sodium chloride flush, traMADol  Allergies:    Allergies  Allergen Reactions  . Ancef [Cefazolin] Rash    Tolerates Zosyn  . Primaxin [Imipenem] Swelling    Developed lip swelling post administration of Primaxin even though she had tolerated this in the past.  . Heparin     (HIT antibody positive; SRA negative  . Vancomycin Rash    Social History:   Social History   Social History  . Marital status: Divorced    Spouse name: N/A  . Number of children: N/A  . Years of education: N/A   Occupational History  . Not on file.   Social History Main Topics  . Smoking status: Never Smoker  . Smokeless tobacco: Never Used  . Alcohol use No  . Drug use: No  . Sexual activity: Yes   Other Topics Concern  . Not on file   Social History Narrative  . No narrative on file    Family History:    Family History  Problem Relation Age of Onset  .  CAD Mother   . Hypertension Mother      ROS:  Please see the history of present illness.  ROS  All other ROS reviewed and negative.     Physical Exam/Data:   Vitals:   07/13/17 0930 07/13/17 0938 07/13/17 0940 07/13/17 1046  BP: (!) 152/72 127/79 (!) 151/77 (!) 146/72  Pulse: 86 89 85 84  Resp: (!) 26 (!) 29 20 20   Temp:    97.7 F (36.5 C)  TempSrc:    Axillary  SpO2: 100% 99% 98% 99%  Weight:      Height:        Intake/Output Summary (Last 24 hours) at 07/13/17 1452 Last data filed at 07/13/17 0545  Gross per 24 hour  Intake          1662.33 ml  Output             1480 ml  Net           182.33 ml   Filed Weights   06/29/17 0915 06/29/17 2300 07/06/17 0510  Weight: 196 lb 6.4 oz (89.1 kg) 206 lb 4.8 oz (93.6 kg) 217 lb 6.4 oz (98.6 kg)   Body mass index is 38.51 kg/m.  General:  Well nourished, well developed, in no acute distress HEENT:  normal Neck: no JVD Vascular: No carotid bruits Cardiac:  normal S1, S2; RRR; question S1 murmur Lungs:  clear to auscultation bilaterally, no wheezing, rhonchi or rales  Abd: + BS, colostomy in place Ext: trace edema Musculoskeletal:  No deformities, BUE and BLE strength normal and equal Skin: warm and dry  Neuro:  CNs 2-12 intact, no focal abnormalities noted Psych:  Normal affect   EKG:  The EKG was personally reviewed and demonstrates:  Sinus tach Telemetry:  Telemetry was personally reviewed and demonstrates:  N/A  Relevant CV Studies:  TEE 07/13/17: Pt appears to have an aneurysm of the intervalvular fibrosa and moderate AI; although no vegetations identified, this could be sequelae of prior SBE/abscess; suggest full cardiology consult (patient will at least need fu echos in the future) and cardiac gated CT to further assess.   TTE 07/07/17: Study Conclusions - Left ventricle: The cavity size was normal. There was severe   focal basal and mild concentric hypertrophy. Systolic function   was normal. The estimated ejection fraction was in the range of   55% to 60%. Wall motion was normal; there were no regional wall   motion abnormalities. There was an increased relative   contribution of atrial contraction to ventricular filling.   Doppler parameters are consistent with abnormal left ventricular   relaxation (grade 1 diastolic dysfunction). - Aortic valve: The noncoronary AV cusp is thickened with increased   calcification and a shaggy mobile density emanating off of it   worrisome for vegetation. There was moderate regurgitation   directed eccentrically in the LVOT and towards the mitral   anterior leaflet. - Mitral valve: Calcified annulus. Mild focal calcification of the   anterior leaflet (medial segment(s)). There was trivial   regurgitation. - Pulmonary arteries: Systolic pressure could not be accurately   estimated. - Recommendations: Consider transesophageal  echocardiography to   assess AV further for vegetation.  Recommendations:  Consider transesophageal echocardiography to assess AV further for vegetation.   Laboratory Data:  Chemistry Recent Labs Lab 07/11/17 0627 07/12/17 0235 07/13/17 0807  NA 138 139 137  K 3.5 3.7 4.1  CL 104 108 109  CO2 22 21* 19*  GLUCOSE 140*  114* 134*  BUN <5* 6 20  CREATININE 1.09* 1.10* 1.13*  CALCIUM 8.9 8.9 8.7*  GFRNONAA 52* 52* 50*  GFRAA >60 >60 58*  ANIONGAP 12 10 9      Recent Labs Lab 07/09/17 0403 07/11/17 0627 07/13/17 0807  PROT 6.3* 7.3 6.8  ALBUMIN 2.3* 2.6* 2.5*  AST 30 22 22   ALT 28 26 23   ALKPHOS 83 83 72  BILITOT 1.2 0.5 0.6   Hematology Recent Labs Lab 07/11/17 0627 07/12/17 0235 07/13/17 0807  WBC 10.1 10.1 8.5  RBC 3.33* 3.32* 3.26*  HGB 8.3* 8.4* 8.4*  HCT 27.2* 27.4* 27.1*  MCV 81.7 82.5 83.1  MCH 24.9* 25.3* 25.8*  MCHC 30.5 30.7 31.0  RDW 20.1* 20.4* 21.2*  PLT 613* 610* 626*   Cardiac EnzymesNo results for input(s): TROPONINI in the last 168 hours. No results for input(s): TROPIPOC in the last 168 hours.  BNPNo results for input(s): BNP, PROBNP in the last 168 hours.  DDimer No results for input(s): DDIMER in the last 168 hours.  Radiology/Studies:  No results found.  Assessment and Plan:   1. Abnormal echo with intervalvular fibrosa and moderate AI - EKG without signs of heart block - blood cultures with E coli, step, and candida on 06/29/17 - repeat blood culture 07/12/17 with NGTD - ID following and guiding antibiotic regimen (zosyn + diflucan) - EKG with sinus tachy, no heart block   2. Diverticulitis - TPN and clear liquid diet - colostomy in place - pt denies abdominal pain   3. Positive blood cultures - ID following with ABX recs - repeat blood cultures with NGTD   4. HTN -    5. Hx of HIT - no heparin   Reviewed case with attending. Will make an appt in 6 months to see Dr. Acie Fredrickson with a repeat TTE.   For  questions or updates, please contact [place] Please consult [email or web address] for contact info under Cardiology/STEMI.   Signed, Ledora Bottcher, [place]  07/13/2017 2:52 PM  Attending Note:   The patient was seen and examined.  Agree with assessment and plan as noted above.  Changes made to the above note as needed.  Patient seen and independently examined with Doreene Adas, PA .   We discussed all aspects of the encounter. I agree with the assessment and plan as stated above.  1.   Aneurism of ht intervalvular fibrosa: Unusual abnormality. Have discussed with colleagues.  May be a sequela of previous endocarditis She has no hx of endocarditis or any cardiac issues.  May be congenital No active cardiac issues at this point.  This abnormality can be seen on TTE . We will follow the AI and the aneurism with standard transthoracic echo .   I will see her in 6 months for follow up office visit .   Will get an echo before the visit     I have spent a total of 40 minutes with patient reviewing hospital  notes , telemetry, EKGs, labs and examining patient as well as establishing an assessment and plan that was discussed with the patient. > 50% of time was spent in direct patient care.    Thayer Headings, Brooke Bonito., MD, [facility] 07/13/2017, 4:05 PM [address],  [place] Pager [phone]

## 2017-07-13 NOTE — Progress Notes (Signed)
Nutrition Follow-up  DOCUMENTATION CODES:   Obesity unspecified  INTERVENTION:   -Continue Boost Breeze po TID, each supplement provides 250 kcal and 9 grams of protein -TPN management per pharmacy  NUTRITION DIAGNOSIS:   Inadequate oral intake related to altered GI function as evidenced by NPO status.  Ongoing  GOAL:   Patient will meet greater than or equal to 90% of their needs  Progressing  MONITOR:   Diet advancement, Labs, Weight trends, Skin, I & O's  REASON FOR ASSESSMENT:   Consult    ASSESSMENT:   Lindsay [name] is a 66 y.o. female with 11 day history of LLQ pain with associated fever, chills, drenching nightsweats was admitted on 10/15 for evaluation. Found to have fever of 103F, leukocytosis of 33K, CT showing large sigmoid diverticulitis with 9 x 6 cm paradiverticular abscess and prerectal space abscess measuring 3.7 x 3.7cm.   S/p  PROCEDURE 06/29/17:  1. Exploratory laparotomy 2. Sigmoid colectomy (Hartmann's procedure) with end transverse colostomy 3. Left salpingectomy 4. Repair of cystotomy  Labs reviewed.   10/22- PICC placed 10/29- s/p TEE- no vegetations; advanced to clear liquid diet  Boost Breeze ordered on 07/10/17; pt accepted this morning's dose.   Pt remains on TPN- currently receiving customized TPN (AA/CHO/Lipids 62/15/22)) at 75 mL/hr (goal rate). Regimen provides 1760 kcals and 112 grams protein, which meets 100% of estimated nutritional needs. Per pharmacy note, plan to reduce customized TPN (AA/CHO/lipids: 62/15/reduce to 17) at 75 ml/hr, due to elevated triglycerides. TPN to provide 1670 kCal and 112gm of protein per day, meeting 100% of patient's needs  Labs reviewed: CBGS: 120-139 (inpatient orders for glycemic control are 0-9 units insulin aspart every 6 hours).  NUTRITION - FOCUSED PHYSICAL EXAM: Completed on 07/10/17    Most Recent Value  Orbital Region  No depletion  Upper Arm Region  No depletion  Thoracic and Lumbar  Region  No depletion  Buccal Region  No depletion  Temple Region  No depletion  Clavicle Bone Region  No depletion  Clavicle and Acromion Bone Region  No depletion  Scapular Bone Region  No depletion  Dorsal Hand  No depletion  Patellar Region  No depletion  Anterior Thigh Region  No depletion  Posterior Calf Region  No depletion  Edema (RD Assessment)  Mild  Hair  Reviewed  Eyes  Reviewed  Mouth  Reviewed  Skin  Reviewed  Nails  Reviewed       Diet Order:  TPN ADULT (ION) TPN ADULT (ION) Diet clear liquid Room service appropriate? Yes; Fluid consistency: Thin  EDUCATION NEEDS:   Education needs have been addressed  Skin:  Skin Assessment: Skin Integrity Issues: Skin Integrity Issues:: Wound VAC Wound Vac: abdominal incision  Last BM:  07/05/17 (10 ml output via colostomy)  Height:   Ht Readings from Last 1 Encounters:  06/29/17 5\' 3"  (1.6 m)    Weight:   Wt Readings from Last 1 Encounters:  07/06/17 217 lb 6.4 oz (98.6 kg)    Ideal Body Weight:  52.3 kg  BMI:  Body mass index is 38.51 kg/m.  Estimated Nutritional Needs:   Kcal:  1650-1850  Protein:  105-120 grams  Fluid:  > 1.6 L    Orie Baxendale A. Jimmye Norman, RD, LDN, CDE Pager: [phone] After hours Pager: [phone]

## 2017-07-14 ENCOUNTER — Encounter (HOSPITAL_COMMUNITY): Payer: Self-pay | Admitting: Cardiology

## 2017-07-14 ENCOUNTER — Inpatient Hospital Stay (HOSPITAL_COMMUNITY): Payer: BLUE CROSS/BLUE SHIELD

## 2017-07-14 DIAGNOSIS — I340 Nonrheumatic mitral (valve) insufficiency: Secondary | ICD-10-CM

## 2017-07-14 LAB — GLUCOSE, CAPILLARY
Glucose-Capillary: 117 mg/dL — ABNORMAL HIGH (ref 65–99)
Glucose-Capillary: 127 mg/dL — ABNORMAL HIGH (ref 65–99)
Glucose-Capillary: 134 mg/dL — ABNORMAL HIGH (ref 65–99)
Glucose-Capillary: 135 mg/dL — ABNORMAL HIGH (ref 65–99)
Glucose-Capillary: 169 mg/dL — ABNORMAL HIGH (ref 65–99)

## 2017-07-14 LAB — PHOSPHORUS: Phosphorus: 3.6 mg/dL (ref 2.5–4.6)

## 2017-07-14 LAB — MAGNESIUM: Magnesium: 2 mg/dL (ref 1.7–2.4)

## 2017-07-14 MED ORDER — TRAVASOL 10 % IV SOLN
INTRAVENOUS | Status: DC
  Filled 2017-07-14: qty 1116

## 2017-07-14 MED ORDER — IOTHALAMATE MEGLUMINE 17.2 % UR SOLN
250.0000 mL | Freq: Once | URETHRAL | Status: AC | PRN
  Administered 2017-07-14: 250 mL via INTRAVESICAL

## 2017-07-14 MED ORDER — HYDROMORPHONE HCL 1 MG/ML IJ SOLN
1.0000 mg | INTRAMUSCULAR | Status: DC | PRN
  Administered 2017-07-15: 1 mg via INTRAVENOUS
  Filled 2017-07-14: qty 1

## 2017-07-14 MED ORDER — ENSURE ENLIVE PO LIQD
237.0000 mL | Freq: Two times a day (BID) | ORAL | Status: DC
  Administered 2017-07-14 – 2017-07-16 (×4): 237 mL via ORAL

## 2017-07-14 MED ORDER — TRAVASOL 10 % IV SOLN
INTRAVENOUS | Status: AC
  Administered 2017-07-14: 17:00:00 via INTRAVENOUS
  Filled 2017-07-14: qty 595.2

## 2017-07-14 MED ORDER — ACETAMINOPHEN 500 MG PO TABS
1000.0000 mg | ORAL_TABLET | Freq: Three times a day (TID) | ORAL | Status: DC
  Administered 2017-07-14 (×2): 1000 mg via ORAL
  Filled 2017-07-14 (×2): qty 2

## 2017-07-14 NOTE — Progress Notes (Signed)
Brief Nutrition Follow-Up Note  Chart reviewed. RD last evaluated pt on 07/13/17; refer to note for further details.   Per surgery notes, plan to increase to full liquid diet and start to wean TPN. Per pharmacy note, pt currently receiving customized TPN (AA/CHO/lipids:62/15/reduce to 17) at 75 ml/hr, due to elevated triglycerides. TPN to provide 1670 kCal and 112gm of protein per day, meeting 100% of patient's needs. Plan to decrease customized TPN to 40 ml/hr, which will provide 891 kcals and 60 grams protein daily, meeting 54% of estimated kcal needs and 57% of estimated protein needs.   Due to diet advancement, will d/c Boost Breeze po TID, each supplement provides 250 kcal and 9 grams of protein, and add Ensure Enlive po BID, each supplement provides 350 kcal and 20 grams of protein.   RD will continue to follow and adjust nutrition plan of care accordingly.   Lindsay [name] A. Jimmye Norman, RD, LDN, CDE Pager: [phone] After hours Pager: [phone]

## 2017-07-14 NOTE — Progress Notes (Signed)
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  Ostomy OK some fluid in the bag, says she feels like she needs to have BM, but just can't.  Up and walked x 1 yesterday.  Wound vac in place.  OT and PT working with her to [employer].  Objective: Vital signs in last 24 hours: Temp:  [98.8 F (37.1 C)-99.1 F (37.3 C)] 99.1 F (37.3 C) (10/30 0440) Pulse Rate:  [84-92] 92 (10/30 0440) Resp:  [19] 19 (10/30 0440) BP: (138-160)/(69-71) 138/71 (10/30 0440) SpO2:  [100 %] 100 % (10/30 0440) Last BM Date: 07/12/17 120 PO 1070 IV 1650 urine 50 drain Stool nothing recorded Afebrile, VSS No labs today Cystogram shows:  Normal bladder.  No leak/extravasation is identified  Intake/Output from previous day: 10/29 0701 - 10/30 0700 In: 1829 [P.O.:120; I.V.:970; IV Piggyback:100] Out: 1700 [Urine:1650; Drains:50] Intake/Output this shift: Total I/O In: -  Out: 850 [Urine:850]  General appearance: alert, cooperative and no distress Resp: clear to auscultation bilaterally GI: still very tender to touch, fluid in the ostomy bag and tolerating clears.  + BS  Lab Results:   Recent Labs  07/12/17 0235 07/13/17 0807  WBC 10.1 8.5  HGB 8.4* 8.4*  HCT 27.4* 27.1*  PLT 610* 626*    BMET  Recent Labs  07/12/17 0235 07/13/17 0807  NA 139 137  K 3.7 4.1  CL 108 109  CO2 21* 19*  GLUCOSE 114* 134*  BUN 6 20  CREATININE 1.10* 1.13*  CALCIUM 8.9 8.7*   PT/INR No results for input(s): LABPROT, INR in the last 72 hours.   Recent Labs Lab 07/09/17 0403 07/11/17 0627 07/13/17 0807  AST 30 22 22   ALT 28 26 23   ALKPHOS 83 83 72  BILITOT 1.2 0.5 0.6  PROT 6.3* 7.3 6.8  ALBUMIN 2.3* 2.6* 2.5*     Lipase     Component Value Date/Time   LIPASE 22 06/29/2017 0824     Medications: . Chlorhexidine Gluconate Cloth  6 each Topical Daily   . feeding supplement  1 Container Oral TID BM  . fluconazole  400 mg Oral Daily  . insulin aspart  0-9 Units Subcutaneous Q6H  . oxybutynin  5 mg Oral TID  . pantoprazole  40 mg Oral Q supper  . polyethylene glycol  17 g Oral BID   Anti-infectives    Start     Dose/Rate Route Frequency Ordered Stop   07/06/17 1130  valACYclovir (VALTREX) tablet 2,000 mg     2,000 mg Oral 2 times daily 07/06/17 1129 07/06/17 2214   07/05/17 1000  fluconazole (DIFLUCAN) tablet 400 mg     400 mg Oral Daily 07/04/17 1110 07/16/17 0959   07/01/17 1030  fluconazole (DIFLUCAN) IVPB 400 mg  Status:  Discontinued     400 mg 100 mL/hr over 120 Minutes Intravenous Every 24 hours 06/30/17 0957 07/04/17 1110   06/30/17 1830  piperacillin-tazobactam (ZOSYN) IVPB 3.375 g     3.375 g 12.5 mL/hr over 240 Minutes Intravenous Every 8 hours 06/30/17 1106 07/15/17 2359   06/30/17 1115  piperacillin-tazobactam (ZOSYN) IVPB 3.375 g     3.375 g 100 mL/hr over 30 Minutes Intravenous  Once 06/30/17 1046 06/30/17 1412   06/30/17 1030  fluconazole (DIFLUCAN) IVPB 800 mg     800 mg 200 mL/hr over 120 Minutes  Intravenous  Once 06/30/17 0957 06/30/17 1537   06/29/17 2130  ciprofloxacin (CIPRO) IVPB 400 mg  Status:  Discontinued     400 mg 200 mL/hr over 60 Minutes Intravenous Every 12 hours 06/29/17 1106 06/30/17 1046   06/29/17 1730  metroNIDAZOLE (FLAGYL) IVPB 500 mg  Status:  Discontinued     500 mg 100 mL/hr over 60 Minutes Intravenous Every 8 hours 06/29/17 1106 06/30/17 1046   06/29/17 1000  metroNIDAZOLE (FLAGYL) IVPB 500 mg  Status:  Discontinued     500 mg 100 mL/hr over 60 Minutes Intravenous Every 8 hours 06/29/17 0915 06/29/17 1117   06/29/17 1000  ciprofloxacin (CIPRO) IVPB 400 mg  Status:  Discontinued     400 mg 200 mL/hr over 60 Minutes Intravenous Every 12 hours 06/29/17 0915 06/29/17 1117      Assessment/Plan Diverticulitis with perforation - S/P laparotomy, sigmoid colectomy and end transverse  colostomy, left salpingectomy, repair of cystotomy, Dr. Dema [name], 10/15 - sm amt of stool in bag this am - TPN - will start to wean - will restart clears after TEE  Delirium - improving  Bacteriemia-strep species, E. Coli, and Candida tropicalis - ID following and appreciate their assistance Dr. Carlyle [name] 06/30/17, 07/06/17 - 14d course of IV pip/tazo 10/16 -10/31,   PO Diflucan 10/21- 07/16/17 (per ID)   - TEE pending, 07/13/17: Normal LV function, moderate aortic insufficiency, aneurysm of the anterior mitral valve leaflet, no vegetations  Malnutrition - TNA  Mild renal insuffiencey - stable Active problems: HTN - Norvasc is her only pre admit medicine she is taking. Lymphedema Lower extremity ulcerations Erythema nodosum Hx of HIT Hypothyroidism -  Anemia - stable  FEN: IV fluids/TNA/restart clear liquids - increase to a full liquids- start weaning TNA.   VTE: SCD's, Hx of HIT  ID: + blood cultures for Strep, E. Coli and Candida: zosyn and diflucan Will complete Zosyn tomorrow and Diflucan on 07/16/17 Foley:  Cystogram shows no leak will DC foley today. Follow up: Dr. Dema [name]    Plan   LOS: 15 days    Lindsay [name] 07/14/2017 [phone]

## 2017-07-14 NOTE — Progress Notes (Signed)
[place] nurse assisted teammate to problem solve VAC machine, found to be dressing and pouch placement instead. Resolved issues and seal obtained with current dressing and pouching system.   [place], [place], [place]

## 2017-07-14 NOTE — Progress Notes (Addendum)
PHARMACY - ADULT TOTAL PARENTERAL NUTRITION CONSULT NOTE   Pharmacy Consult for TPN Indication:  Post-operative ileus  Patient Measurements: Height: 5\' 3"  (160 cm) Weight: 217 lb 6.4 oz (98.6 kg) IBW/kg (Calculated) : 52.4 TPN AdjBW (KG): 62.7 Body mass index is 38.51 kg/m.  Assessment:  71 YOF with PMH of LE lymphedema, HTN, diverticulitis, and LE cellulitis admitted 06/29/17 after 11 days history of LLQ abdominal pain and nausea, found to have acute perforated sigmoid diverticulitis and associated pericolonic abscess and pelvic abscess s/p laparotomy, sigmoid colectomy, and end transverse colostomy with left salpingectomy and repair of cystotomy on 06/29/17. CT of abdomen/pelvis on 07/09/17 showed no abscess but findings suggestive of ileus and pharmacy was consulted for TPN.   GI: prealbumin WNL at 20.8. Albumin low at 2.5. No N/V with clears but no intake charted over last 24 hrs. Colostomy O/P 0 mL.  Surgery would like to start weaning TPN today and advanced diet to full liquids. PPI PO, Miralax Endo: no hx DM - CBGs well controlled. (120-130s yesterday) Insulin requirements in the past 24 hours: 2 units sSSI  Lytes: all wnl except low CO2 (Phos trending up). No new labs today? Renal: SCr up 1.13, BUN WNL, UOP 0.7 ml/kg/hr, net -6 L since admit.  1/2NS at 10 ml/hr Pulm: stable on RA Cards: BP elevated, HR controlled (Norvasc PTA) Hepatobil: LFTs / tbili WNL.  TG elevated at 271. Neuro: confusion/delirium improving. Pain 0-8, PRN Dilaudid/oxycodone. ID: Zosyn + Fluc for Ecoli, Streptococcus anginosis, and Candida tropicalis bacteremia. Afebrile, WBC WNL.  Per ID, plan to treat x2 weeks total with end date 07/15/17.   Best Practices: CHG TPN Access: PICC (single lumen) 07/06/17 TPN start date: 07/10/17  Nutritional Goals (per RD recommendation on 10/29):  1650-1850 kCal and 105-120gm protein per day Fluid: > 1.6 L *Using TPN ABW (62.7 kg) for calculations  Current Nutrition:   TPN Clear liquids Resource Breeze 1 TID (received 1 yesterday, 250 kcal, 9 g of protein)  Plan:  Decrease customized TPN to 40 ml/hr Electrolytes in TPN: Keep Cl:Ac ratio of 1:2 This TPN provides 60 g of protein, 144 g of dextrose, and 16 g of lipid emulsion which gives a total of 891 kcals per day which meets > 50% of his daily needs. Surgery is trying to advance diet to meet rest of nutritional needs Add MVI and trace elements to TPN Continue 1/2NS at 16ml/hr Continue sensitive SSI and adjust as needed Monitor TPN labs, labs tomorrow F/U ability to tolerate diet  Elenor Quinones, PharmD, BCPS Clinical Pharmacist Pager [phone] 07/14/2017 11:03 AM

## 2017-07-14 NOTE — Progress Notes (Signed)
    No active cardiology issues Will sign off She will see me in 6 months     Mertie Moores, MD  07/14/2017 3:09 PM    [place],  [place], [place]  [address] Pager [phone] Phone: [phone]; Fax: [phone]

## 2017-07-15 LAB — BASIC METABOLIC PANEL
Anion gap: 10 (ref 5–15)
BUN: 25 mg/dL — ABNORMAL HIGH (ref 6–20)
CO2: 19 mmol/L — ABNORMAL LOW (ref 22–32)
Calcium: 8.5 mg/dL — ABNORMAL LOW (ref 8.9–10.3)
Chloride: 110 mmol/L (ref 101–111)
Creatinine, Ser: 1.18 mg/dL — ABNORMAL HIGH (ref 0.44–1.00)
GFR calc Af Amer: 55 mL/min — ABNORMAL LOW (ref >60)
GFR calc non Af Amer: 48 mL/min — ABNORMAL LOW (ref >60)
Glucose, Bld: 113 mg/dL — ABNORMAL HIGH (ref 65–99)
Potassium: 3.8 mmol/L (ref 3.5–5.1)
Sodium: 139 mmol/L (ref 135–145)

## 2017-07-15 LAB — MAGNESIUM: Magnesium: 1.9 mg/dL (ref 1.7–2.4)

## 2017-07-15 LAB — CBC
HCT: 26.8 % — ABNORMAL LOW (ref 36.0–46.0)
Hemoglobin: 8.2 g/dL — ABNORMAL LOW (ref 12.0–15.0)
MCH: 26 pg (ref 26.0–34.0)
MCHC: 30.6 g/dL (ref 30.0–36.0)
MCV: 85.1 fL (ref 78.0–100.0)
Platelets: 546 10*3/uL — ABNORMAL HIGH (ref 150–400)
RBC: 3.15 MIL/uL — ABNORMAL LOW (ref 3.87–5.11)
RDW: 22.3 % — ABNORMAL HIGH (ref 11.5–15.5)
WBC: 6.8 10*3/uL (ref 4.0–10.5)

## 2017-07-15 LAB — GLUCOSE, CAPILLARY
Glucose-Capillary: 108 mg/dL — ABNORMAL HIGH (ref 65–99)
Glucose-Capillary: 159 mg/dL — ABNORMAL HIGH (ref 65–99)
Glucose-Capillary: 95 mg/dL (ref 65–99)

## 2017-07-15 LAB — PHOSPHORUS: Phosphorus: 3.8 mg/dL (ref 2.5–4.6)

## 2017-07-15 MED ORDER — ACETAMINOPHEN 500 MG PO TABS
1000.0000 mg | ORAL_TABLET | Freq: Three times a day (TID) | ORAL | Status: DC
  Administered 2017-07-15 – 2017-07-16 (×3): 1000 mg via ORAL
  Filled 2017-07-15 (×3): qty 2

## 2017-07-15 NOTE — Progress Notes (Signed)
2 Days Post-Op    CC:  Subjective: Pt did not walk yesterday, but did x 1 this AM.  Mentation is improving. We took down the wound vac and the ostomy bag today.  She has some more solid soft stool in the bag.  The ostomy and open wound looks great.  She cannot deal with wound vac without IV pain meds and the wound looks great.  Will switch to wet to dry dressings.    Objective: Vital signs in last 24 hours: Temp:  [98.3 F (36.8 C)-98.9 F (37.2 C)] 98.9 F (37.2 C) (10/31 0543) Pulse Rate:  [80-92] 92 (10/31 0543) Resp:  [18-20] 18 (10/31 0543) BP: (132-147)/(68-78) 132/78 (10/31 0543) SpO2:  [99 %-100 %] 99 % (10/31 0543) Last BM Date: 07/14/17 (ostomy)  Intake/Output from previous day: 10/30 0701 - 10/31 0700 In: 1675.7 [P.O.:440; I.V.:1085.7; IV Piggyback:150] Out: 2425 [Urine:2225; Stool:200] Intake/Output this shift: Total I/O In: -  Out: 50 [Drains:50]  General appearance: alert, cooperative and no distress Resp: clear to auscultation bilaterally GI: soft, open wound looks great and is 80% closed.  depth is just a few MM.  + BS, and stool in very good looking ostomy.    Lab Results:   Recent Labs  07/13/17 0807 07/15/17 0605  WBC 8.5 6.8  HGB 8.4* 8.2*  HCT 27.1* 26.8*  PLT 626* 546*    BMET  Recent Labs  07/13/17 0807 07/15/17 0605  NA 137 139  K 4.1 3.8  CL 109 110  CO2 19* 19*  GLUCOSE 134* 113*  BUN 20 25*  CREATININE 1.13* 1.18*  CALCIUM 8.7* 8.5*   PT/INR No results for input(s): LABPROT, INR in the last 72 hours.   Recent Labs Lab 07/09/17 0403 07/11/17 0627 07/13/17 0807  AST 30 22 22   ALT 28 26 23   ALKPHOS 83 83 72  BILITOT 1.2 0.5 0.6  PROT 6.3* 7.3 6.8  ALBUMIN 2.3* 2.6* 2.5*     Lipase     Component Value Date/Time   LIPASE 22 06/29/2017 0824     Medications: . acetaminophen  1,000 mg Oral Q8H  . Chlorhexidine Gluconate Cloth  6 each Topical Daily  . feeding supplement (ENSURE ENLIVE)  237 mL Oral BID BM  .  fluconazole  400 mg Oral Daily  . insulin aspart  0-9 Units Subcutaneous Q6H  . oxybutynin  5 mg Oral TID  . pantoprazole  40 mg Oral Q supper  . polyethylene glycol  17 g Oral BID    Assessment/Plan Diverticulitis with perforation - S/P laparotomy, sigmoid colectomy and end transverse colostomy, left salpingectomy, repair of cystotomy, Dr. Dema Severin, 10/15 - sm amt of stool in bag this am - TPN - will wean off   Delirium - improving  Bacteriemia-strep species,E. Coli,and Candida tropicalis - ID following and appreciate their assistance Dr. 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 Lymphedema Lower extremity ulcerations Erythema nodosum Hx of HIT Hypothyroidism - Anemia - stable  FEN: IV fluids/TNA/go to a soft diet today and wean TNA VTE: SCD's, Hx of HIT  ID: + blood cultures for Strep, E. Coli and Candida: zosyn and diflucan  Will complete Zosyn tomorrow and Diflucan on 07/16/17 Foley:  out  Follow up: Dr. Dema Severin Home PT, Home health Nurse    Plan:  Wean TNA, soft diet, increase ambulation, begin getting her ready for discharge next 24-48 hours.   Wet to dry dressing for the abdominal wound  LOS: 16 days    Lindsay [name] 07/15/2017 [phone]

## 2017-07-15 NOTE — Progress Notes (Signed)
Physical Therapy Treatment Patient Details Name: Lindsay [name] MRN: [id number] DOB: 09-17-1950 Today's Date: 07/15/2017    History of Present Illness Pt is a 66 y.o. female admitted with perforated diverticulitis. She underwent laparotomy/sigmoid colectomy 06-29-17. Pt postive for ileus after testing today (07/08/17). Pertinent PMH includes lymphedema, HTN, cellulitis.     PT Comments    Pt eager to go home and reports "I should be out by Friday"  Pt tolerated ambulation without AD and completed stair negotiation this date. Pt with increased fatigue with amb without AD but remained steady. Acute PT to con't to follow.   Follow Up Recommendations  Home health PT;Supervision/Assistance - 24 hour     Equipment Recommendations  None recommended by PT    Recommendations for Other Services       Precautions / Restrictions Precautions Precautions: None Precaution Comments: wound vac disconected Restrictions Weight Bearing Restrictions: No    Mobility  Bed Mobility               General bed mobility comments: pt up in chair upon PT arrival  Transfers Overall transfer level: Needs assistance Equipment used: None Transfers: Sit to/from Stand Sit to Stand: Supervision         General transfer comment: pt able to push from chair without difficulty  Ambulation/Gait Ambulation/Gait assistance: Min guard Ambulation Distance (Feet): 500 Feet Assistive device: None Gait Pattern/deviations: Step-through pattern;Decreased stride length;Wide base of support Gait velocity: dec Gait velocity interpretation: Below normal speed for age/gender General Gait Details: pt with decreased cadence and decreased step length but able to amb without AD without episode of LOB. pt with c/o fatigue but reports "it felt good, just harder than with the walker"   Stairs Stairs: Yes   Stair Management: One rail Left;Step to pattern;Forwards Number of Stairs: 3 General stair comments: v/c's  for sequencing and to provide HHA due to not having bilat HR like home set up  Wheelchair Mobility    Modified Rankin (Stroke Patients Only)       Balance Overall balance assessment: Needs assistance Sitting-balance support: No upper extremity supported;Feet supported Sitting balance-Leahy Scale: Fair     Standing balance support: No upper extremity supported Standing balance-Leahy Scale: Fair                              Cognition Arousal/Alertness: Awake/alert Behavior During Therapy: WFL for tasks assessed/performed Overall Cognitive Status: Within Functional Limits for tasks assessed                                        Exercises      General Comments        Pertinent Vitals/Pain Pain Assessment: 0-10 Pain Score: 2  Pain Location: abdomen Pain Descriptors / Indicators: Discomfort Pain Intervention(s): Monitored during session    Home Living                      Prior Function            PT Goals (current goals can now be found in the care plan section) Acute Rehab PT Goals Patient Stated Goal: Return home Progress towards PT goals: Progressing toward goals    Frequency    Min 3X/week      PT Plan Current plan remains appropriate    Co-evaluation  AM-PAC PT "6 Clicks" Daily Activity  Outcome Measure  Difficulty turning over in bed (including adjusting bedclothes, sheets and blankets)?: A Lot Difficulty moving from lying on back to sitting on the side of the bed? : A Lot Difficulty sitting down on and standing up from a chair with arms (e.g., wheelchair, bedside commode, etc,.)?: A Little Help needed moving to and from a bed to chair (including a wheelchair)?: A Little Help needed walking in hospital room?: A Little Help needed climbing 3-5 steps with a railing? : A Little 6 Click Score: 16    End of Session   Activity Tolerance: Patient tolerated treatment well Patient left: in  chair;with call bell/phone within reach Nurse Communication: Mobility status PT Visit Diagnosis: Difficulty in walking, not elsewhere classified (R26.2);Pain     Time: 5176-1607 PT Time Calculation (min) (ACUTE ONLY): 19 min  Charges:  $Gait Training: 8-22 mins                    G Codes:       Kittie Plater, PT, DPT Pager #: [phone] Office #: [phone]    [place] 07/15/2017, 11:13 AM

## 2017-07-15 NOTE — Care Management Note (Addendum)
Case Management Note  Patient Details  Name: Lindsay [name] MRN: [id number] Date of Birth: 1951/06/20  Subjective/Objective:                    Action/Plan: KCI VAC discontinued . Home KCI VAC placed at front nursing station on 6n. Called to schedule pick up . Confirmation number [id number].  Changed HHRN wound care to wet to dry dressing changes . Updated [facility] on wound care and IV ABX complete  Expected Discharge Date:  07/03/17               Expected Discharge Plan:  [place]  In-House Referral:  NA  Discharge planning Services  CM Consult  Post Acute Care Choice:  Home Health Choice offered to:  Adult Children, Patient  DME Arranged:  [place] DME Agency:  [place] Arranged:  RN, PT, IV Antibiotics HH Agency:  [facility]  Status of Service:  In process, will continue to follow  If discussed at Long Length of Stay Meetings, dates discussed:    Additional Comments:  Marilu Favre, RN 07/15/2017, 2:06 PM

## 2017-07-15 NOTE — Consult Note (Signed)
[place] Nurse ostomy follow up Stoma type/location: LLQ Colostomy, pouch change today.  Stomal assessment/size:  1 1/4" slightly oval, pink with thick liquid dark brown/green stool Peristomal assessment: intact, has midline abdominal incision close to ostomy.  Treatment options for stomal/peristomal skin: skin prep, barrier ring and 1 piece convex pouch.  Output thick liquid brown/green stool Ostomy pouching: 1pc. convex Education provided: Patient was medicated for pain due to wound vac dressing.  She did however participate in ostomy care today. She talked me through why we trace pattern, how important to keep bag out of way of scissors and that if it does not stick to check to make sure pulled plastic backing off. She also rolled the pouch closed, opened to simulate emptying and re-closed it. No family at bedside. Modena Jansky, PA at bedside. Instructed to pt to let family know she will be home in a day or two and for them to be prepared for her to need a lot of assistance. She is to focus on caring for self, not house and others.  Enrolled patient in [facility] Start Discharge program: previously  [place] Nurse wound follow up Wound type:midline abdominal surgical wound Measurement: 19cm x 3cm x 1.4cm Wound WUJ:WJXBJ red, granulating tissue Drainage (amount, consistency, odor) minimal serosanguinous Periwound:intact except small skin tear 0.2cm x 1cm presumably where previous tape had been. Dressing procedure/placement/frequency: Pre-medicated, vac removed, Will Creig Hines, PA said to not replace vac, just perform wet to dry dressings as patient will be going home soon and not have as much pain medication to use prior to dressing changes. NS moist to dry dressing applied, orders written. Bedside RN to have portable equipment dept pick up NPWT pump. Pt happy about new dressing routine. [place] team will follow for discharge ostomy teaching on Friday.   Fara Olden, RN-C, WTA-C, [facility] Ostomy Care Associate

## 2017-07-15 NOTE — Progress Notes (Signed)
[facility] CONSULT NOTE   Pharmacy Consult for TPN Indication:  Post-operative ileus  Patient Measurements: Height: 5\' 3"  (160 cm) Weight: 217 lb 6.4 oz (98.6 kg) IBW/kg (Calculated) : 52.4 TPN AdjBW (KG): 62.7 Body mass index is 38.51 kg/m.  Assessment:  54 YOF with PMH of LE lymphedema, HTN, diverticulitis, and LE cellulitis admitted 06/29/17 after 11 days history of LLQ abdominal pain and nausea, found to have acute perforated sigmoid diverticulitis and associated pericolonic abscess and pelvic abscess s/p laparotomy, sigmoid colectomy, and end transverse colostomy with left salpingectomy and repair of cystotomy on 06/29/17. CT of abdomen/pelvis on 07/09/17 showed no abscess but findings suggestive of ileus and pharmacy was consulted for TPN.   GI: prealbumin WNL at 20.8. Albumin low at 2.5. No N/V with clears but no intake charted over last 24 hrs. Last BM was 10/30. Colostomy had 286mL of output. Started on full liquids yesterday, eating ~50% of meals. PPI PO, Miralax Endo: no hx DM - CBGs well controlled. (110-160s yesterday) Insulin requirements in the past 24 hours: 3 units sSSI  Lytes: all wnl except low CO2 (Phos trending up). K down to 3.8 (ileus resolved) Phos and Mg ok. Renal: SCr up 1.18, BUN up to 25, UOP 0.7 ml/kg/hr, net - about 6.5 L since admit.  1/2NS at 10 ml/hr Pulm: stable on RA Cards: BP ok, HR controlled (Norvasc PTA) Hepatobil: LFTs / tbili WNL.  TG elevated at 271. Neuro: confusion/delirium improving. Pain 0-8, PRN Dilaudid/oxycodone. ID: Zosyn + Fluc for Ecoli, Streptococcus anginosis, and Candida tropicalis bacteremia. Afebrile, WBC WNL.  Per ID, plan to treat x2 weeks total with end date of 07/15/17.   Best Practices: CHG TPN Access: PICC (single lumen) 07/06/17 TPN start date: 07/10/17  Nutritional Goals (per RD recommendation on 10/29):  KCal: 1650-1850  Protein: 105-120g per day Fluid: > 1.6 L *Using TPN ABW (62.7  kg) for calculations  Current Nutrition:  Full liquids TPN Ensure Enlive BID (350kcal and 20g of protein each)  Plan:  Will try to wean off TPN today Continue current TPN at 34ml/hr and will stop at 1800 tonight Surgery advancing to soft diet and will continue on Ensure Enlive D/C SSI and TPN orders, surgery to re-consult Rx if need to restart TPN   Elenor Quinones, PharmD, BCPS Clinical Pharmacist Pager [phone] 07/15/2017 7:49 AM

## 2017-07-16 LAB — BASIC METABOLIC PANEL
Anion gap: 8 (ref 5–15)
BUN: 21 mg/dL — ABNORMAL HIGH (ref 6–20)
CO2: 21 mmol/L — ABNORMAL LOW (ref 22–32)
Calcium: 8.7 mg/dL — ABNORMAL LOW (ref 8.9–10.3)
Chloride: 111 mmol/L (ref 101–111)
Creatinine, Ser: 1.36 mg/dL — ABNORMAL HIGH (ref 0.44–1.00)
GFR calc Af Amer: 47 mL/min — ABNORMAL LOW (ref >60)
GFR calc non Af Amer: 40 mL/min — ABNORMAL LOW (ref >60)
Glucose, Bld: 86 mg/dL (ref 65–99)
Potassium: 4 mmol/L (ref 3.5–5.1)
Sodium: 140 mmol/L (ref 135–145)

## 2017-07-16 LAB — GLUCOSE, CAPILLARY
Glucose-Capillary: 101 mg/dL — ABNORMAL HIGH (ref 65–99)
Glucose-Capillary: 102 mg/dL — ABNORMAL HIGH (ref 65–99)
Glucose-Capillary: 88 mg/dL (ref 65–99)

## 2017-07-16 MED ORDER — SODIUM CHLORIDE 0.9 % IV BOLUS (SEPSIS)
1000.0000 mL | Freq: Once | INTRAVENOUS | Status: AC
  Administered 2017-07-16: 1000 mL via INTRAVENOUS

## 2017-07-16 MED ORDER — OXYCODONE HCL 5 MG PO TABS: 5.0000 mg | ORAL_TABLET | ORAL | 0 refills | Status: DC | PRN

## 2017-07-16 MED ORDER — POLYETHYLENE GLYCOL 3350 17 G PO PACK: PACK | ORAL | 0 refills | Status: DC

## 2017-07-16 MED ORDER — OXYBUTYNIN CHLORIDE 5 MG PO TABS: ORAL_TABLET | ORAL | 0 refills | Status: DC

## 2017-07-16 MED ORDER — ACETAMINOPHEN 500 MG PO TABS: ORAL_TABLET | ORAL | 0 refills | Status: DC

## 2017-07-16 MED ORDER — ENSURE ENLIVE PO LIQD: ORAL | 12 refills | Status: DC

## 2017-07-16 NOTE — Progress Notes (Signed)
Patient discharged home with daughter via wheelchair. Discharged instructions and belongings given to patient . No further questions asked

## 2017-07-16 NOTE — Progress Notes (Signed)
Occupational Therapy Treatment Patient Details Name: Lindsay [name] MRN: [id number] DOB: 04/23/1951 Today's Date: 07/16/2017    History of present illness Pt is a 66 y.o. female admitted with perforated diverticulitis. She underwent laparotomy/sigmoid colectomy 06-29-17. Pt postive for ileus after testing today (07/08/17). Pertinent PMH includes lymphedema, HTN, cellulitis.    OT comments  Pt able to complete toilet transfer, grooming in standing, and simulated tub transfer with supervision this session. Recommended supervision for tub transfers upon return home and shower seat for safety with bathing--daughter plans to purchase shower chair for pt. D/c plan remains appropriate. Will continue to follow acutely.   Follow Up Recommendations  Home health OT    Equipment Recommendations  Tub/shower seat (daughter plans to purchase)    Recommendations for Other Services      Precautions / Restrictions Precautions Precautions: None Restrictions Weight Bearing Restrictions: No       Mobility Bed Mobility               General bed mobility comments: Pt sitting EOB upon arrival  Transfers Overall transfer level: Needs assistance Equipment used: None Transfers: Sit to/from Stand Sit to Stand: Supervision         General transfer comment: for safety, no physical assist required    Balance Overall balance assessment: Needs assistance Sitting-balance support: Feet supported;No upper extremity supported Sitting balance-Leahy Scale: Normal     Standing balance support: No upper extremity supported;During functional activity Standing balance-Leahy Scale: Fair                             ADL either performed or assessed with clinical judgement   ADL Overall ADL's : Needs assistance/impaired     Grooming: Supervision/safety;Wash/dry hands;Standing             Upper Body Dressing Details (indicate cue type and reason): Pt reports she was able to complete  UB dressing without assist   Lower Body Dressing Details (indicate cue type and reason): Pt reports she was able to don underwear without assist Toilet Transfer: Supervision/safety;Ambulation;Comfort height toilet   Toileting- Clothing Manipulation and Hygiene: Supervision/safety;Sit to/from stand   Tub/ Shower Transfer: Supervision/safety;Tub transfer;Ambulation Tub/Shower Transfer Details (indicate cue type and reason): Simulated in room with supervision. Recommeded supervision upon return home with tub trasfers and use of shower chair--daughter plans to purchase one Functional mobility during ADLs: Supervision/safety       Vision       Perception     Praxis      Cognition Arousal/Alertness: Awake/alert Behavior During Therapy: WFL for tasks assessed/performed Overall Cognitive Status: Within Functional Limits for tasks assessed                                          Exercises     Shoulder Instructions       General Comments      Pertinent Vitals/ Pain       Pain Assessment: No/denies pain  Home Living                                          Prior Functioning/Environment              Frequency  Min 2X/week  Progress Toward Goals  OT Goals(current goals can now be found in the care plan section)  Progress towards OT goals: Progressing toward goals  Acute Rehab OT Goals Patient Stated Goal: Return home OT Goal Formulation: With patient/family  Plan Discharge plan remains appropriate    Co-evaluation                 AM-PAC PT "6 Clicks" Daily Activity     Outcome Measure   Help from another person eating meals?: None Help from another person taking care of personal grooming?: A Little Help from another person toileting, which includes using toliet, bedpan, or urinal?: A Little Help from another person bathing (including washing, rinsing, drying)?: A Little Help from another person to put on  and taking off regular upper body clothing?: None Help from another person to put on and taking off regular lower body clothing?: A Little 6 Click Score: 20    End of Session    OT Visit Diagnosis: Unsteadiness on feet (R26.81);Pain   Activity Tolerance Patient tolerated treatment well   Patient Left with call bell/phone within reach;with family/visitor present;Other (comment) (sitting EOB)   Nurse Communication          Time: 0867-6195 OT Time Calculation (min): 9 min  Charges: OT General Charges $OT Visit: 1 Visit OT Treatments $Self Care/Home Management : 8-22 mins  Amos Gaber A. Ulice Brilliant, M.S., OTR/L Pager: [place] 07/16/2017, 2:24 PM

## 2017-07-16 NOTE — Discharge Summary (Signed)
Physician Discharge Summary  Patient ID: Lindsay [name] MRN: [id number] DOB/AGE: 1951-08-05 66 y.o.  Admit date: 06/29/2017 Discharge date: 07/16/2017  Admission Diagnoses:  acute sigmoid diverticulitis, associated pericolonic abscess + pelvic abscess Acute kidney injury Hx of hypertension History of lymphedema with lower extremity ulcerations  Discharge Diagnoses:  Perforated diverticulitis Acute kidney injury Polymicrobial Bacteremia/fungemia     Active Problems:   Diverticulitis of colon with perforation   Diverticulitis   Non-rheumatic mitral regurgitation   PROCEDURES: Exploratory laparotomy, sigmoid colectomy with end transverse colostomy, left salpingectomy, repair of cystostomy, 06/29/17, Dr. Tana Conch Course:  66 y.o. female with hx of HTN presented to ED today with 11d hx of LLQ abdominal pain. She thought for the last week and a half she was having bad gas pain as she's also had diarrhea but with it not going away sought further evaluation. +Nausea, no emesis. +Fever/chills. Reports pain is isolated to her left lower quadrant.  She was seen in consultation by Dr. Dema Severin and it was his opinion she had sigmoid diverticulitis with abscess.  CT scan showed an 8 x 9 x 6.3.  Her diverticular abscess with stool.  A consult to IR for drain was made.  But with her elevated creatinine this was not felt to be safe.  Patient was reexamined later by Dr. Dema Severin after the IR evaluation.  She remained febrile and tachycardic with hypotension and was subsequently recommended to go forward with emergent surgery.  Patient underwent procedure as described above.  Abscess was found to have eroded into both the colon and the left fallopian tube at the time of surgery.  She was seen by infectious disease on 06/30/2017 secondary to bacteremia/fungemia.  She was continued on antibiotics and switched over to Zosyn and IV Diflucan.  She had a slow course postoperatively recovering from  all of this.  She developed some encephalopathy/delirium that improved time.  Also had malnutrition and was treated with TNA. Wound VAC was placed to help with midline wound healing.  She made slow but steady improvement.  Her kidney function has improved.  Her delirium has resolved.  By 07/16/17 she was taking a soft diet.  Her wound was healing nicely, the wound VAC was extremely painful for her and we switched her over to wet-to-dry dressings.  Her Diflucan and Zosyn were completed and it was Dr. Trevor Mace opinion that she can be discharged home.  Home health has been arranged.  She will have OT PT and RN to assist with her care.  The open wound will undergo wet-to-dry dressings twice daily.  She is learning to use the ostomy which is working nicely.  We have asked her to return to her primary care next week for labs and feet especially observant of her renal function.  I discussed with her daughter the need to monitor her weight and make sure she is not losing too much weight, secondary to decreased p.o. intake.  CBC Latest Ref Rng & Units 07/15/2017 07/13/2017 07/12/2017  WBC 4.0 - 10.5 K/uL 6.8 8.5 10.1  Hemoglobin 12.0 - 15.0 g/dL 8.2(L) 8.4(L) 8.4(L)  Hematocrit 36.0 - 46.0 % 26.8(L) 27.1(L) 27.4(L)  Platelets 150 - 400 K/uL 546(H) 626(H) 610(H)   CMP Latest Ref Rng & Units 07/16/2017 07/15/2017 07/13/2017  Glucose 65 - 99 mg/dL 86 113(H) 134(H)  BUN 6 - 20 mg/dL 21(H) 25(H) 20  Creatinine 0.44 - 1.00 mg/dL 1.36(H) 1.18(H) 1.13(H)  Sodium 135 - 145 mmol/L 140 139 137  Potassium 3.5 -  5.1 mmol/L 4.0 3.8 4.1  Chloride 101 - 111 mmol/L 111 110 109  CO2 22 - 32 mmol/L 21(L) 19(L) 19(L)  Calcium 8.9 - 10.3 mg/dL 8.7(L) 8.5(L) 8.7(L)  Total Protein 6.5 - 8.1 g/dL - - 6.8  Total Bilirubin 0.3 - 1.2 mg/dL - - 0.6  Alkaline Phos 38 - 126 U/L - - 72  AST 15 - 41 U/L - - 22  ALT 14 - 54 U/L - - 23   Condition on discharge: Improving  Disposition: 06-Home-Health Care Svc   Allergies as of  07/16/2017      Reactions   Ancef [cefazolin] Rash   Tolerates Zosyn   Primaxin [imipenem] Swelling   Developed lip swelling post administration of Primaxin even though she had tolerated this in the past.   Heparin    (HIT antibody positive; SRA negative   Vancomycin Rash      Medication List    STOP taking these medications   colchicine 0.6 MG tablet   pantoprazole 40 MG tablet Commonly known as:  PROTONIX   predniSONE 20 MG tablet Commonly known as:  DELTASONE   tacrolimus 0.1 % ointment Commonly known as:  PROTOPIC     TAKE these medications   acetaminophen 500 MG tablet Commonly known as:  TYLENOL Use this as your base for pain control.  I would use it for the next few days until she is stronger and feeling better.  Then you can adjust down is she is feeling better.  You can buy this over the counter at any drug store.  Do not exceed 4000 mg of Tylenol/acetaminophen per day. What changed:  medication strength  how much to take  how to take this  when to take this  reasons to take this  additional instructions   amLODipine 5 MG tablet Commonly known as:  NORVASC Take 5 mg by mouth daily.   CENTRUM ADULTS PO Take 1 tablet by mouth daily.   clobetasol cream 0.05 % Commonly known as:  TEMOVATE Apply 1 application topically daily.   feeding supplement (ENSURE ENLIVE) Liqd You can use whatever feeding supplement you like.  The idea is to get extra protein to help with healing.  If she can get 2-3 per day that would be useful.  You can buy this at any store that has a product you like and can afford. What changed:  how much to take  how to take this  when to take this  additional instructions   oxybutynin 5 MG tablet Commonly known as:  DITROPAN This is for your bladder and you take one twice a day. You should not need it very long and we are giving you just 5 days.  If you have any issues call the office.   oxyCODONE 5 MG immediate release  tablet Commonly known as:  Oxy IR/ROXICODONE Take 1-2 tablets (5-10 mg total) by mouth every 4 (four) hours as needed for moderate pain or severe pain (5 mg for moderate pain; 10 mg for severe pain). What changed:  reasons to take this   polyethylene glycol packet Commonly known as:  MIRALAX / GLYCOLAX This is to help you with Bowel movements and is also a medicine to help with constipation.  I am decreasing you to once daily and if you feel like you are having to much stool on a daily basis you can discontinue this.  You can use it as needed for constipation, as per package instructions.  You can buy  this over the counter at any drug store.  Follow package instructions. What changed:  how much to take  how to take this  when to take this  reasons to take this  additional instructions   SSKI 1 GM/ML solution Generic drug:  potassium iodide Take by mouth 3 (three) times daily. Started at 5 drops, then increase 1 drop daily till patient reaches 15 drops. Pt is up to 9 drops daily as of 01/28/16            Durable Medical Equipment        Start     Ordered   07/03/17 1428  For home use only DME Tub bench  Once     07/03/17 1427     Follow-up Information    [facility] Follow up.   Why:  Home Health RN Contact information: [place] [address] [phone]        Merrilee Seashore, MD Follow up.   Specialty:  Internal Medicine Why:  CALL  and let them check her labs next week, especially concerned about her renal function.  Also to assist with medical issues. Contact information: [address] [place] [address] [phone]        Ileana Roup, MD Follow up.   Specialty:  General Surgery Why:  Call for an appointment in 2 weeks. Contact information: [place] [address] [phone]           Signed: Earnstine Regal 07/16/2017, 2:56 PM

## 2017-07-16 NOTE — Progress Notes (Signed)
3 Days Post-Op    CC: abdominal pain  Subjective: She feels good and taking soft diet.  Ostomy bag leaking into the wound.  She feels better and seems to be doing well.  Her daughter would like to take her home today.    Objective: Vital signs in last 24 hours: Temp:  [98.3 F (36.8 C)-98.9 F (37.2 C)] 98.3 F (36.8 C) (11/01 0612) Pulse Rate:  [81-88] 82 (11/01 0612) Resp:  [17-18] 17 (11/01 0612) BP: (140-148)/(56-60) 140/58 (11/01 0612) SpO2:  [99 %-100 %] 100 % (11/01 0612) Last BM Date: 07/15/17 470 Po 600 urine  50 from the drain No stool recorded Afebrile, VSS Creatinine is up - Off IV fluids   Intake/Output from previous day: 10/31 0701 - 11/01 0700 In: 470 [P.O.:470] Out: 650 [Urine:600; Drains:50] Intake/Output this shift: Total I/O In: -  Out: 150 [Stool:150]  General appearance: alert, cooperative and no distress Resp: clear to auscultation bilaterally GI: open wound is clean but currently dressing is covered with stool.  leak from side of the ostomy bag.  Tolerating diet, stool in bag.    Lab Results:   Recent Labs  07/15/17 0605  WBC 6.8  HGB 8.2*  HCT 26.8*  PLT 546*    BMET  Recent Labs  07/15/17 0605 07/16/17 0407  NA 139 140  K 3.8 4.0  CL 110 111  CO2 19* 21*  GLUCOSE 113* 86  BUN 25* 21*  CREATININE 1.18* 1.36*  CALCIUM 8.5* 8.7*   PT/INR No results for input(s): LABPROT, INR in the last 72 hours.   Recent Labs Lab 07/11/17 0627 07/13/17 0807  AST 22 22  ALT 26 23  ALKPHOS 83 72  BILITOT 0.5 0.6  PROT 7.3 6.8  ALBUMIN 2.6* 2.5*     Lipase     Component Value Date/Time   LIPASE 22 06/29/2017 0824     Medications: . acetaminophen  1,000 mg Oral Q8H  . Chlorhexidine Gluconate Cloth  6 each Topical Daily  . feeding supplement (ENSURE ENLIVE)  237 mL Oral BID BM  . oxybutynin  5 mg Oral TID  . pantoprazole  40 mg Oral Q supper  . polyethylene glycol  17 g Oral BID   . sodium chloride 10 mL/hr at 07/11/17  1806  . sodium chloride     Anti-infectives    Start     Dose/Rate Route Frequency Ordered Stop   07/06/17 1130  valACYclovir (VALTREX) tablet 2,000 mg     2,000 mg Oral 2 times daily 07/06/17 1129 07/06/17 2214   07/05/17 1000  fluconazole (DIFLUCAN) tablet 400 mg     400 mg Oral Daily 07/04/17 1110 07/15/17 0941   07/01/17 1030  fluconazole (DIFLUCAN) IVPB 400 mg  Status:  Discontinued     400 mg 100 mL/hr over 120 Minutes Intravenous Every 24 hours 06/30/17 0957 07/04/17 1110   06/30/17 1830  piperacillin-tazobactam (ZOSYN) IVPB 3.375 g     3.375 g 12.5 mL/hr over 240 Minutes Intravenous Every 8 hours 06/30/17 1106 07/15/17 2359   06/30/17 1115  piperacillin-tazobactam (ZOSYN) IVPB 3.375 g     3.375 g 100 mL/hr over 30 Minutes Intravenous  Once 06/30/17 1046 06/30/17 1412   06/30/17 1030  fluconazole (DIFLUCAN) IVPB 800 mg     800 mg 200 mL/hr over 120 Minutes Intravenous  Once 06/30/17 0957 06/30/17 1537   06/29/17 2130  ciprofloxacin (CIPRO) IVPB 400 mg  Status:  Discontinued     400 mg  200 mL/hr over 60 Minutes Intravenous Every 12 hours 06/29/17 1106 06/30/17 1046   06/29/17 1730  metroNIDAZOLE (FLAGYL) IVPB 500 mg  Status:  Discontinued     500 mg 100 mL/hr over 60 Minutes Intravenous Every 8 hours 06/29/17 1106 06/30/17 1046   06/29/17 1000  metroNIDAZOLE (FLAGYL) IVPB 500 mg  Status:  Discontinued     500 mg 100 mL/hr over 60 Minutes Intravenous Every 8 hours 06/29/17 0915 06/29/17 1117   06/29/17 1000  ciprofloxacin (CIPRO) IVPB 400 mg  Status:  Discontinued     400 mg 200 mL/hr over 60 Minutes Intravenous Every 12 hours 06/29/17 0915 06/29/17 1117      Assessment/Plan Diverticulitis with perforation - S/P laparotomy, sigmoid colectomy and end transverse colostomy, left salpingectomy, repair of cystotomy, Dr. Dema Severin, 10/15 - sm amt of stool in bag this am - TPN - will wean off   Delirium - improving  Bacteriemia-strep species,E. Coli,and Candida  tropicalis - ID following and appreciate their assistance Dr. 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 Lymphedema Lower extremity ulcerations Erythema nodosum Hx of HIT Hypothyroidism - Anemia - stable  FEN: Soft diet VTE: SCD's, Hx of HIT  ID: + blood cultures for Strep, E. Coli and Candida: zosyn and diflucan:  Completed Zosyn and Diflucan yesterday Foley: out  Follow up: Dr. Dema Severin Home PT, Home health Nurse    Plan:  She looks good the Diflucan and Zosyn were completed last PM.   Her creatinine is up with the fluids off.  Will discuss going home with Dr. Ninfa Linden.  Give her some extra fluids this AM.   LOS: 17 days    Nanna Ertle 07/16/2017 [phone]

## 2017-07-16 NOTE — Discharge Instructions (Signed)
[facility]      [facility] °[phone] ° °OPEN ABDOMINAL SURGERY: POST OP INSTRUCTIONS ° °Always review your discharge instruction sheet given to you by the facility where your surgery was performed. ° °IF YOU HAVE DISABILITY OR FAMILY LEAVE FORMS, YOU MUST BRING THEM TO THE OFFICE FOR PROCESSING.  PLEASE DO NOT GIVE THEM TO YOUR DOCTOR. ° °1. A prescription for pain medication may be given to you upon discharge.  Take your pain medication as prescribed, if needed.  If narcotic pain medicine is not needed, then you may take acetaminophen (Tylenol) or ibuprofen (Advil) as needed. °2. Take your usually prescribed medications unless otherwise directed. °3. If you need a refill on your pain medication, please contact your pharmacy. They will contact our office to request authorization.  Prescriptions will not be filled after 5pm or on week-ends. °4. You should follow a light diet the first few days after arrival home, such as soup and crackers, pudding, etc.unless your doctor has advised otherwise. A high-fiber, low fat diet can be resumed as tolerated.   Be sure to include lots of fluids daily. Most patients will experience some swelling and bruising on the chest and neck area.  Ice packs will help.  Swelling and bruising can take several days to resolve °5. Most patients will experience some swelling and bruising in the area of the incision. Ice pack will help. Swelling and bruising can take several days to resolve..  °6. It is common to experience some constipation if taking pain medication after surgery.  Increasing fluid intake and taking a stool softener will usually help or prevent this problem from occurring.  A mild laxative (Milk of Magnesia or Miralax) should be taken according to package directions if there are no bowel movements after 48 hours. °7.  You may have steri-strips (small skin tapes) in place directly over the incision.  These strips should be left on the skin for 7-10 days.  If your  surgeon used skin glue on the incision, you may shower in 24 hours.  The glue will flake off over the next 2-3 weeks.  Any sutures or staples will be removed at the office during your follow-up visit. You may find that a light gauze bandage over your incision may keep your staples from being rubbed or pulled. You may shower and replace the bandage daily. °8. ACTIVITIES:  You may resume regular (light) daily activities beginning the next day--such as daily self-care, walking, climbing stairs--gradually increasing activities as tolerated.  You may have sexual intercourse when it is comfortable.  Refrain from any heavy lifting or straining until approved by your doctor. °a. You may drive when you no longer are taking prescription pain medication, you can comfortably wear a seatbelt, and you can safely maneuver your car and apply brakes °b. Return to Work: ___________________________________ °9. You should see your doctor in the office for a follow-up appointment approximately two weeks after your surgery.  Make sure that you call for this appointment within a day or two after you arrive home to insure a convenient appointment time. °OTHER INSTRUCTIONS:  °_____________________________________________________________ °_____________________________________________________________ ° °WHEN TO CALL YOUR DOCTOR: °1. Fever over 101.0 °2. Inability to urinate °3. Nausea and/or vomiting °4. Extreme swelling or bruising °5. Continued bleeding from incision. °6. Increased pain, redness, or drainage from the incision. °7. Difficulty swallowing or breathing °8. Muscle cramping or spasms. °9. Numbness or tingling in hands or feet or around lips. ° °The clinic staff is available to   answer your questions during regular business hours.  Please dont hesitate to call and ask to speak to one of the nurses if you have concerns.  For further questions, please visit [email or web address]   Colostomy Home Guide, Adult A  colostomy is a surgical procedure to make an opening (stoma) for stool (feces) to leave your body. This surgery is done when a medical condition prevents stool from leaving your body through the end of the large intestine (rectum). During the surgery, part of the large intestine (colon) is attached to the stoma that is made in the front of your abdomen. A bag (pouch) is fitted over the stoma. Stool and gas will collect in the bag. After having this surgery, you will need to empty and change your colostomy bag as needed. You will also need to care for the stoma. How do I care for my stoma? Your stoma should look pink, red, and moist, like the inside of your cheek. At first, the stoma may be swollen, but this swelling will go away within 6 weeks. To care for the stoma:  Keep the skin around the stoma clean and dry.  Use a clean, soft washcloth to gently wash the stoma and the skin around it. ? Use warm water and only use cleansers recommended by your health care provider. ? Rinse the stoma area with plain water. ? Dry the area well.  Use stoma powder or ointment on your skin only as told by your health care provider. Do not use any other powders, gels, wipes, or creams on your skin.  Change your colostomy bag if your skin becomes irritated. Irritation may indicate that the bag is leaking.  Check your stoma area every day for signs of infection. Check for: ? More redness, swelling, or pain. ? More fluid or blood. ? Pus or warmth.  Measure the stoma opening regularly and record the size. Watch for changes. Share this information with your health care provider.  How do I care for my colostomy bag? The bag that fits over the stoma can have either one or two pieces.  One-piece bag: The skin barrier and the bag are combined in a single unit.  Two-piece bag: The skin barrier and the bag are separate pieces that attach to each other. 1.   Empty your bag at bedtime and whenever it is one-third  to one-half full. Do not let more stool or gas build up. This could cause the bag to leak. Some colostomy bags have a built-in gas release valve. Change the bag every 3-4 days or as told by your health care provider. Also change the bag if it is leaking or separating from the skin or your skin looks irritated. How do I empty my colostomy bag? Before you leave the hospital, you will be taught how to empty your bag. Follow these basic steps: 1. Wash your hands with soap and water. 2. Sit far back on the toilet. 3. Put several pieces of toilet paper into the toilet water. This will prevent splashing as you empty the stool into the toilet. 4. Remove the clip or the velcro from the tail end of the bag. 5. Unroll the tail, then empty stool into the toilet. 6. Clean the tail with toilet paper. 1.  7. Reroll the tail, and close it with the clip or velcro. 8. Wash your hands again.  How do I change my colostomy bag? Before you leave the hospital, you will be taught how to change your bag.  Always have colostomy supplies with you, and follow these basic steps: 1. Wash your hands with soap and water. Have paper towels or tissues near you to clean any discharge. 2. Use a template to pre-cut the skin barrier. Smooth any rough edges. 3. If using a two-piece bag, attach the bag and the skin barrier to each other. Add the barrier ring, if you use one. 4. If your stools are watery, add a few cotton balls to the new bag to absorb the liquid. 5. Remove the old bag and skin barrier. Gently push the skin away from the barrier with your fingers or a warm cloth. 6. Wash your hands again. Then clean the stoma area as directed with water or with mild soap and water. Use water to rinse away any soap. 7. Dry the skin. You may use the cool setting on a hair dryer to do this. 8. If directed, apply stoma powder or skin barrier gel to the skin. 9. Dry the skin again. 10. Warm the skin barrier with your hands or a warm  compress. 11. Remove the paper from the sticky (adhesive) strip of the skin barrier. 12. Press the adhesive strip onto the skin around the stoma. 13. Gently rub the skin barrier onto the skin. This creates heat that helps the barrier to stick. 14. Apply stoma tape to the edges of the skin barrier.  What are some general tips?  Avoid wearing clothes that are tight directly over your stoma.  You may shower or bathe with the colostomy bag on or off. Do not use harsh or oily soaps or lotions. Dry the skin and bag after bathing.  Store all supplies in a cool, dry place. Do not leave supplies in extreme heat because parts can melt.  Whenever you leave home, take an extra skin barrier and colostomy bag with you.  If your colostomy bag gets wet, you can dry it with a hair dryer on the cool setting.  To prevent odor, put drops of ostomy deodorizer in the colostomy bag. Your health care provider may also recommend putting ostomy lubricant inside the bag. This helps the stool to slide out of the bag more easily and completely. Contact a health care provider if:  You have more redness, swelling, or pain around your stoma.  You have more fluid or blood coming from your stoma.  Your stoma feels warm to the touch.  You have pus coming from your stoma.  Your stoma extends in or out farther than normal.  You need to change the bag every day.  You have a fever. Get help right away if:  Your stool is bloody.  You vomit.  You have trouble breathing. This information is not intended to replace advice given to you by your health care provider. Make sure you discuss any questions you have with your health care provider. Document Released: 09/04/2003 Document Revised: 01/10/2016 Document Reviewed: 01/04/2014 Elsevier Interactive Patient Education  2018 [place].  Mechanical Wound Debridement She can shower with wound open and then redress with wet to dry dressing.  Mechanical wound  debridement is a treatment to remove dead tissue from a wound. This helps the wound heal. The treatment involves cleaning the wound (irrigation) and using a pad or gauze (dressing) to remove dead tissue and debris from the wound. There are different types of mechanical wound debridement. Depending on the wound, you may need to repeat this procedure or change to another form of debridement as your wound starts  to heal. Tell a health care provider about:  Any allergies you have.  All medicines you are taking, including vitamins, herbs, eye drops, creams, and over-the-counter medicines.  Any blood disorders you have.  Any medical conditions you have, including any conditions that: ? Cause a significant decrease in blood circulation to the part of the body where the wound is, such as peripheral vascular disease. ? Compromise your defense (immune) system or white blood count.  Any surgeries you have had.  Whether you are pregnant or may be pregnant. What are the risks? Generally, this is a safe procedure. However, problems may occur, including:  Infection.  Bleeding.  Damage to healthy tissue in and around your wound.  Soreness or pain.  Failure of the wound to heal.  Scarring.  What happens before the procedure? You may be given antibiotic medicine to help prevent infection. What happens during the procedure?  Your health care provider may apply a numbing medicine (topical anesthetic) to the wound.  Your health care provider will irrigate your wound with a germ-free (sterile), salt-water (saline) solution. This removes debris, bacteria, and dead tissue.  Depending on what type of mechanical wound debridement you are having, your health care provider may do one of the following: ? Put a dressing on your wound. You may have dry gauze pad placed into the wound. Your health care provider will remove the gauze after the wound is dry. Any dead tissue and debris that has dried into the  gauze will be lifted out of the wound (wet-to-dry debridement). ? Use a type of pad (monofilament fiber debridement pad). This pad has a fluffy surface on one side that picks up dead tissue and debris from your wound. Your health care provider wets the pad and wipes it over your wound for several minutes. ? Irrigate your wound with a pressurized stream of solution such as saline or water.  Once your health care provider is finished, he or she may apply a light dressing to your wound. The procedure may vary among health care providers and hospitals. What happens after the procedure?  You may receive medicine for pain.  You will continue to receive antibiotic medicine if it was started before your procedure. This information is not intended to replace advice given to you by your health care provider. Make sure you discuss any questions you have with your health care provider. Document Released: 05/23/2015 Document Revised: 02/07/2016 Document Reviewed: 01/10/2015 Elsevier Interactive Patient Education  [employer].

## 2017-07-28 ENCOUNTER — Ambulatory Visit: Payer: Self-pay | Admitting: Surgery

## 2017-07-28 NOTE — H&P (Signed)
History of Present Illness Lindsay [name] Lindsay [name] [name] MD; 07/28/2017 11:06 AM) Patient words: CC: Here for postop f/u s/p exlap/Hartmann's for severe diverticulitis 06/29/17 with repair of cystotomy - discharged earlier this month - protracted recovery related to AKI and medical comorbidities.Pathology benign. Doing well at home - tolerating diet, moving bowels via colostomy. Denies f/c. Voiding well without difficulty. No issues with the colostomy - daughter helping with pouching as well. Doing wet to dries to midline wound without issue.  Never had a colonoscopy before.  The patient is a 66 year old female.   Allergies Lindsay [name], RMA; 07/28/2017 10:24 AM) Ancef *CEPHALOSPORINS*   Primaxin *ANTI-INFECTIVE AGENTS - MISC.*   Heparin (Porcine) in D5W *ANTICOAGULANTS*   Vancomycin HCl *ANTI-INFECTIVE AGENTS - MISC.*    Medication History Lindsay [name], RMA; 07/28/2017 10:27 AM) Multiple Vitamins  (Oral) Active. Acetaminophen  (325MG  Tablet, Oral) Active. MiraLax  (Oral) Active. Boost  (Oral) Active. AmLODIPine Besylate  (10MG  Tablet, Oral) Active. Medications Reconciled   Vitals Lindsay [name] RMA; 07/28/2017 10:27 AM) 07/28/2017 10:27 AM Weight: 188 lb   Height: 63 in  Body Surface Area: 1.88 m   Body Mass Index: 33.3 kg/m   Temp.: 97.6 F    Pulse: 121 (Regular)    BP: 170/96 (Sitting, Left Arm, Standard)       Physical Exam Lindsay [name] Lindsay [name] [name] MD; 07/28/2017 11:04 AM) The physical exam findings are as follows: Note: Constitutional: No acute distress, conversant, no deformities  Eyes: Moist conjunctiva  Neck: Trachea midline  Lungs: Normal respiratory effort  CV: RRR; no palpable thrills; no pitting edema  GI: Abdomen soft, NT; midline wound healing well - granulating - ~0.5cm deep. No erythema or drainage. Colostomy with appliance in place, stool and gas in bag.  MSK: Normal gait; no clubbing/cyanosis  Psych: Appropriate affect; alert and oriented  x3    Assessment & Plan Lindsay [name] Lindsay [name] [name] MD; 07/28/2017 11:08 AM) STATUS POST HARTMANN PROCEDURE (Z93.3) Impression: Lindsay [name] is a very pleasant 18F s/p Hartmann's 06/29/17 for acute perforated diverticulitis and repair of cystotomy - pathology benign and discussed with patient -No prior colonoscopy - will schedule for one in January 2019 per the patient's preference - wants to fully recover from this -Ultimately will need GGE prior to considering for reversal -The planned procedure (colonoscopy), material risks (including, but not limited to, bleeding, perforation, need for additional procedures including surgery, recurrence of neoplasia) benefits and alternatives were discussed. Her and her daughter's questions are answered to their satisfaction, and she elected to proceed with the procedure.

## 2017-08-12 ENCOUNTER — Encounter (INDEPENDENT_AMBULATORY_CARE_PROVIDER_SITE_OTHER): Payer: BLUE CROSS/BLUE SHIELD | Admitting: Ophthalmology

## 2017-08-12 DIAGNOSIS — H43813 Vitreous degeneration, bilateral: Secondary | ICD-10-CM

## 2017-08-12 DIAGNOSIS — I10 Essential (primary) hypertension: Secondary | ICD-10-CM

## 2017-08-12 DIAGNOSIS — H34812 Central retinal vein occlusion, left eye, with macular edema: Secondary | ICD-10-CM

## 2017-08-12 DIAGNOSIS — H35033 Hypertensive retinopathy, bilateral: Secondary | ICD-10-CM

## 2017-09-23 ENCOUNTER — Encounter (INDEPENDENT_AMBULATORY_CARE_PROVIDER_SITE_OTHER): Payer: BLUE CROSS/BLUE SHIELD | Admitting: Ophthalmology

## 2017-09-23 DIAGNOSIS — H43813 Vitreous degeneration, bilateral: Secondary | ICD-10-CM

## 2017-09-23 DIAGNOSIS — H2513 Age-related nuclear cataract, bilateral: Secondary | ICD-10-CM | POA: Diagnosis not present

## 2017-09-23 DIAGNOSIS — H34812 Central retinal vein occlusion, left eye, with macular edema: Secondary | ICD-10-CM | POA: Diagnosis not present

## 2017-09-23 DIAGNOSIS — I10 Essential (primary) hypertension: Secondary | ICD-10-CM

## 2017-09-23 DIAGNOSIS — H35033 Hypertensive retinopathy, bilateral: Secondary | ICD-10-CM

## 2017-10-06 ENCOUNTER — Ambulatory Visit: Payer: Self-pay | Admitting: Surgery

## 2017-10-06 NOTE — H&P (Signed)
History of Present Illness Harrell Gave M. Nathaneil Feagans MD; 10/06/2017 12:09 PM) Patient words: CC: Postop follow-up  HPI: Here for postop f/u s/p exlap/Hartmann's for severe diverticulitis 06/29/17 with repair of cystotomy, left salpingectomy - postoperative course complicated by ileus and delirium as well as AKI. Pathology benign. Doing well at home - tolerating diet, moving bowels via colostomy. No issues with the colostomy - daughter helping with pouching as well.  Midline wound has closed.  She has returned to work at [employer] [facility].  Over the last couple weeks she has noticed intermittent mucoid discharge from the anus that has since developed blood tinge to it.  Never had a colonoscopy before.  PMH: HTN PSH: Vaginal hysterectomy; Hartman's procedure FHx: Denies family history of colorectal cancer.  Sister and cousin both had Hartman's procedures for perforated diverticulitis.  Social: Denies use of tobacco/EtOH/drugs ROS: A comprehensive 10 system review of systems was completed with the patient and pertinent findings as noted above.  The patient is a 67 year old female.   Allergies Alean Rinne, [place]; 10/06/2017 11:39 AM) Ancef *CEPHALOSPORINS*   Primaxin *ANTI-INFECTIVE AGENTS - MISC.*   Heparin (Porcine) in D5W *ANTICOAGULANTS*   Vancomycin HCl *ANTI-INFECTIVE AGENTS - MISC.*   Allergies Reconciled    Medication History Alean Rinne, [place]; 10/06/2017 11:40 AM) Multiple Vitamins  (Oral) Active. Acetaminophen  (325MG  Tablet, Oral) Active. MiraLax  (Oral) Active. Boost  (Oral) Active. AmLODIPine Besylate  (10MG  Tablet, Oral) Active. AmLODIPine Besylate  (5MG  Tablet, Oral) Active. Oxybutynin Chloride  (5MG  Tablet, Oral) Active. Medications Reconciled     Review of Systems Harrell Gave M. Romell Wolden MD; 10/06/2017 12:08 PM) General Not Present- Appetite Loss, Chills, Fatigue, Fever, Night Sweats, Weight Gain and Weight Loss. Note:  All other systems negative (unless as  noted in HPI & included Review of Systems) Skin Not Present- Dryness, Hives, Jaundice, Non-Healing Wounds, Rash and Ulcer. HEENT Not Present- Earache, Hearing Loss, Hoarseness, Nose Bleed, Oral Ulcers, Ringing in the Ears, Seasonal Allergies, Sinus Pain, Sore Throat, Visual Disturbances and Yellow Eyes. Respiratory Not Present- Bloody sputum, Chronic Cough, Difficulty Breathing, Snoring and Wheezing. Breast Not Present- Breast Mass, Breast Pain, Nipple Discharge and Skin Changes. Cardiovascular Not Present- Chest Pain, Difficulty Breathing Lying Down, Leg Cramps, Palpitations, Rapid Heart Rate, Shortness of Breath and Swelling of Extremities. Gastrointestinal Not Present- Abdominal Pain, Bloating, Bloody Stool, Change in Bowel Habits, Chronic diarrhea, Constipation, Difficulty Swallowing, Excessive gas, Gets full quickly at meals, Hemorrhoids, Indigestion, Nausea, Rectal Pain and Vomiting. Female Genitourinary Not Present- Frequency, Nocturia, Painful Urination, Pelvic Pain and Urgency. Musculoskeletal Not Present- Back Pain, Joint Pain, Joint Stiffness, Muscle Pain, Muscle Weakness and Swelling of Extremities. Neurological Not Present- Decreased Memory, Fainting, Headaches, Numbness, Seizures, Tingling, Tremor, Trouble walking and Weakness. Psychiatric Not Present- Anxiety, Bipolar, Change in Sleep Pattern, Depression, Fearful and Frequent crying. Endocrine Not Present- Cold Intolerance, Excessive Hunger, Hair Changes, Heat Intolerance, Hot flashes and New Diabetes. Hematology Not Present- Easy Bruising, Excessive bleeding, Gland problems, HIV and Persistent Infections.  Vitals Alean Rinne RMA; 10/06/2017 11:39 AM) 10/06/2017 11:38 AM Weight: 193 lb   Height: 63 in  Body Surface Area: 1.9 m   Body Mass Index: 34.19 kg/m   Temp.: 97.8 F    Pulse: 72 (Regular)    BP: 150/78 (Sitting, Left Arm, Standard)       Physical Exam Harrell Gave M. Dainel Arcidiacono MD; 10/06/2017 12:09 PM) The physical exam  findings are as follows: Note: Constitutional: No acute distress; conversant; no deformities Eyes: Moist conjunctiva; no  lid lag; anicteric sclerae; pupils equal round and reactive to light Neck: Trachea midline; no palpable thyromegaly Lungs: Normal respiratory effort; no tactile fremitus CV: Regular rate and rhythm; no palpable thrill; no pitting edema GI: Abdomen soft, nontender, nondistended; no palpable hepatosplenomegaly; midline wound well healed; colostomy LLQ pink/patent with stool in appliance Anorectal: deferred until colonoscopy MSK: Normal gait; no clubbing/cyanosis Psychiatric: Appropriate affect; alert and oriented 3 Lymphatic: No palpable cervical or axillary lymphadenopathy    Assessment & Plan Harrell Gave M. Antuane Eastridge MD; 10/06/2017 12:11 PM) STATUS POST HARTMANN PROCEDURE (Z93.3) Impression: Ms. Istre is a very pleasant 69F s/p Hartmann's + L salpingectomy and repair of cystotomy 06/29/17 for acute perforated diverticulitis - pathology benign -No prior colonoscopy - will schedule for one now and will look via stoma and anus; will evalute for any rectal neoplasm given her hx of bloody/mucoid stool per anus -Obtain GGE via anus -The planned procedure (colonoscopy), material risks (including, but not limited to, bleeding, perforation, need for additional procedures including surgery, recurrence of neoplasia) benefits and alternatives were discussed. Her questions are answered to their satisfaction, and she elected to proceed with the procedure.  Signed electronically by Ileana Roup, MD (10/06/2017 12:12 PM)

## 2017-10-07 ENCOUNTER — Encounter (HOSPITAL_COMMUNITY): Payer: Self-pay | Admitting: Emergency Medicine

## 2017-10-07 ENCOUNTER — Other Ambulatory Visit: Payer: Self-pay

## 2017-10-08 ENCOUNTER — Other Ambulatory Visit: Payer: Self-pay | Admitting: Surgery

## 2017-10-08 DIAGNOSIS — Z933 Colostomy status: Secondary | ICD-10-CM

## 2017-10-16 ENCOUNTER — Other Ambulatory Visit: Payer: Self-pay

## 2017-10-16 ENCOUNTER — Ambulatory Visit (HOSPITAL_COMMUNITY): Payer: BLUE CROSS/BLUE SHIELD | Admitting: Anesthesiology

## 2017-10-16 ENCOUNTER — Encounter (HOSPITAL_COMMUNITY)
Admission: RE | Disposition: A | Discharge status: Home / Self Care | Payer: Self-pay | Source: Ambulatory Visit | Attending: Surgery

## 2017-10-16 ENCOUNTER — Encounter (HOSPITAL_COMMUNITY): Payer: Self-pay | Admitting: *Deleted

## 2017-10-16 ENCOUNTER — Ambulatory Visit (HOSPITAL_COMMUNITY)
Admission: RE | Admit: 2017-10-16 | Discharge: 2017-10-16 | Disposition: A | Discharge status: Home / Self Care | Payer: BLUE CROSS/BLUE SHIELD | Source: Ambulatory Visit | Attending: Surgery | Admitting: Surgery

## 2017-10-16 DIAGNOSIS — Z79899 Other long term (current) drug therapy: Secondary | ICD-10-CM | POA: Diagnosis not present

## 2017-10-16 DIAGNOSIS — Z880 Allergy status to penicillin: Secondary | ICD-10-CM | POA: Diagnosis not present

## 2017-10-16 DIAGNOSIS — G4730 Sleep apnea, unspecified: Secondary | ICD-10-CM | POA: Diagnosis not present

## 2017-10-16 DIAGNOSIS — I10 Essential (primary) hypertension: Secondary | ICD-10-CM | POA: Insufficient documentation

## 2017-10-16 DIAGNOSIS — K5730 Diverticulosis of large intestine without perforation or abscess without bleeding: Secondary | ICD-10-CM | POA: Diagnosis not present

## 2017-10-16 DIAGNOSIS — Z888 Allergy status to other drugs, medicaments and biological substances status: Secondary | ICD-10-CM | POA: Insufficient documentation

## 2017-10-16 DIAGNOSIS — D123 Benign neoplasm of transverse colon: Secondary | ICD-10-CM | POA: Insufficient documentation

## 2017-10-16 DIAGNOSIS — Z881 Allergy status to other antibiotic agents status: Secondary | ICD-10-CM | POA: Insufficient documentation

## 2017-10-16 DIAGNOSIS — K644 Residual hemorrhoidal skin tags: Secondary | ICD-10-CM | POA: Diagnosis not present

## 2017-10-16 DIAGNOSIS — E039 Hypothyroidism, unspecified: Secondary | ICD-10-CM | POA: Insufficient documentation

## 2017-10-16 DIAGNOSIS — Z1211 Encounter for screening for malignant neoplasm of colon: Secondary | ICD-10-CM | POA: Insufficient documentation

## 2017-10-16 HISTORY — PX: COLONOSCOPY WITH PROPOFOL: SHX5780

## 2017-10-16 SURGERY — COLONOSCOPY WITH PROPOFOL
Anesthesia: Monitor Anesthesia Care

## 2017-10-16 MED ORDER — LACTATED RINGERS IV SOLN
INTRAVENOUS | Status: DC
  Administered 2017-10-16: 09:00:00 via INTRAVENOUS

## 2017-10-16 MED ORDER — PROPOFOL 10 MG/ML IV BOLUS
INTRAVENOUS | Status: AC
  Filled 2017-10-16: qty 40

## 2017-10-16 MED ORDER — SODIUM CHLORIDE 0.9 % IV SOLN: INTRAVENOUS | Status: DC

## 2017-10-16 MED ORDER — ASPIRIN EC 81 MG PO TBEC: 81.0000 mg | DELAYED_RELEASE_TABLET | Freq: Every day | ORAL | 0 refills | Status: DC

## 2017-10-16 MED ORDER — PHENYLEPHRINE HCL 10 MG/ML IJ SOLN
INTRAMUSCULAR | Status: DC | PRN
  Administered 2017-10-16: 80 ug via INTRAVENOUS

## 2017-10-16 MED ORDER — ONDANSETRON HCL 4 MG/2ML IJ SOLN
INTRAMUSCULAR | Status: DC | PRN
  Administered 2017-10-16: 4 mg via INTRAVENOUS

## 2017-10-16 MED ORDER — PROPOFOL 500 MG/50ML IV EMUL
INTRAVENOUS | Status: DC | PRN
  Administered 2017-10-16: 50 mg via INTRAVENOUS
  Administered 2017-10-16: 20 mg via INTRAVENOUS

## 2017-10-16 MED ORDER — PROPOFOL 500 MG/50ML IV EMUL
INTRAVENOUS | Status: DC | PRN
  Administered 2017-10-16: 125 ug/kg/min via INTRAVENOUS

## 2017-10-16 MED ORDER — MIDAZOLAM HCL 2 MG/2ML IJ SOLN
INTRAMUSCULAR | Status: AC
  Filled 2017-10-16: qty 2

## 2017-10-16 MED ORDER — LACTATED RINGERS IV SOLN
INTRAVENOUS | Status: DC | PRN
  Administered 2017-10-16: 08:00:00 via INTRAVENOUS

## 2017-10-16 MED ORDER — MIDAZOLAM HCL 5 MG/5ML IJ SOLN
INTRAMUSCULAR | Status: DC | PRN
  Administered 2017-10-16: 2 mg via INTRAVENOUS

## 2017-10-16 NOTE — Anesthesia Preprocedure Evaluation (Signed)
Anesthesia Evaluation  Patient identified by MRN, date of birth, ID band Patient awake    Reviewed: Allergy & Precautions, NPO status , Patient's Chart, lab work & pertinent test results, reviewed documented beta blocker date and time   History of Anesthesia Complications Negative for: history of anesthetic complications  Airway Mallampati: II  TM Distance: >3 FB Neck ROM: Full    Dental  (+) Teeth Intact,    Pulmonary sleep apnea ,    breath sounds clear to auscultation       Cardiovascular hypertension, Pt. on medications and Pt. on home beta blockers (-) angina(-) Past MI and (-) CHF  Rhythm:Regular     Neuro/Psych negative neurological ROS  negative psych ROS   GI/Hepatic Neg liver ROS,   Endo/Other  Hypothyroidism   Renal/GU negative Renal ROS     Musculoskeletal negative musculoskeletal ROS (+)   Abdominal   Peds  Hematology negative hematology ROS (+)   Anesthesia Other Findings   Reproductive/Obstetrics                             Anesthesia Physical Anesthesia Plan  ASA: II  Anesthesia Plan: MAC   Post-op Pain Management:    Induction:   PONV Risk Score and Plan: 2 and Treatment may vary due to age or medical condition  Airway Management Planned: Nasal Cannula  Additional Equipment: None  Intra-op Plan:   Post-operative Plan:   Informed Consent: I have reviewed the patients History and Physical, chart, labs and discussed the procedure including the risks, benefits and alternatives for the proposed anesthesia with the patient or authorized representative who has indicated his/her understanding and acceptance.   Dental advisory given  Plan Discussed with: CRNA and Surgeon  Anesthesia Plan Comments:         Anesthesia Quick Evaluation

## 2017-10-16 NOTE — H&P (Signed)
H&P Update  H&P from 10/06/17 reviewed. No interval changes  Vitals:   10/16/17 0823  BP: (!) 154/74  Pulse: 90  Resp: 17  Temp: 98.1 F (36.7 C)  TempSrc: Oral  SpO2: 99%  Weight: 88.5 kg (195 lb)  Height: 5\' 3"  (1.6 m)   PLAN -Colonoscopy today via rectum and colostomy -The planned procedure (colonoscopy), material risks (including, but not limited to, bleeding, perforation, need for additional procedures including surgery, recurrence of neoplasia) benefits and alternatives were discussed. Her questions are answered to their satisfaction, and she elected to proceed with the procedure.  Sharon Mt. Dema Severin, M.D. [facility]

## 2017-10-16 NOTE — Op Note (Signed)
[facility] Patient Name: Lindsay [name] Procedure Date: 10/16/2017 MRN: [id number] Attending MD: Ileana Roup MD, MD Date of Birth: [birth year] CSN: [id number] Age: 67 Admit Type: Outpatient Procedure:                Colonoscopy Indications:              Screening for colorectal malignant neoplasm, This                            is the patient's first colonoscopy Providers:                Sharon Mt. Neosha Switalski MD, MD, Cleda Daub, RN,                            Tinnie Gens, Technician, Arnoldo Hooker, CRNA Referring MD:              Medicines:                Monitored Anesthesia Care Complications:            No immediate complications. Estimated Blood Loss:     Estimated blood loss was minimal. Procedure:                Pre-Anesthesia Assessment:                           - Prior to the procedure, a History and Physical                            was performed, and patient medications, allergies                            and sensitivities were reviewed. The patient's                            tolerance of previous anesthesia was reviewed.                           - The risks and benefits of the procedure and the                            sedation options and risks were discussed with the                            patient. All questions were answered and informed                            consent was obtained.                           - Patient identification and proposed procedure                            were verified prior to the procedure by the                            physician,  the nurse and the anesthetist. The                            procedure was verified in the pre-procedure area in                            the endoscopy suite.                           - ASA Grade Assessment: III - A patient with severe                            systemic disease.                           - The anesthesia plan was to use monitored       anesthesia care (MAC).                           After obtaining informed consent, the colonoscope                            was passed under direct vision. Throughout the                            procedure, the patient's blood pressure, pulse, and                            oxygen saturations were monitored continuously. The                            Colonoscope was introduced through the anus and                            advanced to the the terminal ileum, with                            identification of the appendiceal orifice and IC                            valve. The colonoscopy was performed without                            difficulty. The patient tolerated the procedure                            well. The quality of the bowel preparation was                            adequate. Scope In: 9:00:43 AM Scope Out: 9:27:33 AM Scope Withdrawal Time: 0 hours 15 minutes 34 seconds  Total Procedure Duration: 0 hours 26 minutes 50 seconds  Findings:      The perianal exam findings include non-thrombosed external hemorrhoids.      Multiple small and large-mouthed diverticula were found in the entire  colon.      A 3 mm polyp was found in the proximal transverse colon. The polyp was       sessile. The polyp was removed with a jumbo cold forceps. Resection and       retrieval were complete. Estimated blood loss was minimal.      A 5 mm polyp was found in the distal transverse colon. The polyp was       sessile. The polyp was removed with a jumbo cold forceps. Resection and       retrieval were complete. Estimated blood loss was minimal.      The terminal ileum appeared normal.      The exam was otherwise without abnormality. Impression:               - Non-thrombosed external hemorrhoids found on                            perianal exam.                           - Diverticulosis in the entire examined colon.                           - One 3 mm polyp in the proximal  transverse colon,                            removed with a jumbo cold forceps. Resected and                            retrieved.                           - One 5 mm polyp in the distal transverse colon,                            removed with a jumbo cold forceps. Resected and                            retrieved.                           - The examined portion of the ileum was normal.                           - The examination was otherwise normal. Moderate Sedation:      N/A- Per Anesthesia Care Recommendation:           - Discharge patient to home.                           - High fiber diet indefinitely.                           - No aspirin, ibuprofen, naproxen, or other                            non-steroidal anti-inflammatory drugs for 5 days  after polyp removal.                           - Await pathology results.                           - Repeat colonoscopy in 3 years for surveillance.                           - Return to primary care physician at appointment                            to be scheduled. Procedure Code(s):        --- Professional ---                           [id number], Colonoscopy, flexible; with biopsy, single                            or multiple Diagnosis Code(s):        --- Professional ---                           Z12.11, Encounter for screening for malignant                            neoplasm of colon                           K64.4, Residual hemorrhoidal skin tags                           D12.3, Benign neoplasm of transverse colon (hepatic                            flexure or splenic flexure)                           K57.30, Diverticulosis of large intestine without                            perforation or abscess without bleeding CPT copyright 2016 American Medical Association. All rights reserved. The codes documented in this report are preliminary and upon coder review may  be revised to meet current compliance  requirements. Nadeen Landau, MD Ileana Roup MD, MD 10/16/2017 9:37:08 AM This report has been signed electronically. Number of Addenda: 0

## 2017-10-16 NOTE — Anesthesia Postprocedure Evaluation (Signed)
Anesthesia Post Note  Patient: [name]  Procedure(s) Performed: COLONOSCOPY WITH PROPOFOL VIA STOMA AND ANUS (N/A )     Patient location during evaluation: Endoscopy Anesthesia Type: MAC Level of consciousness: awake and alert Pain management: pain level controlled Vital Signs Assessment: post-procedure vital signs reviewed and stable Respiratory status: spontaneous breathing, nonlabored ventilation, respiratory function stable and patient connected to nasal cannula oxygen Cardiovascular status: stable and blood pressure returned to baseline Postop Assessment: no apparent nausea or vomiting Anesthetic complications: no    Last Vitals:  Vitals:   10/16/17 1000 10/16/17 1010  BP: 119/68 126/66  Pulse: 74   Resp: 20   Temp:    SpO2: 100%     Last Pain:  Vitals:   10/16/17 0936  TempSrc: Oral                 [place] Stell

## 2017-10-16 NOTE — Transfer of Care (Signed)
Immediate Anesthesia Transfer of Care Note  Patient: Lindsay [name]  Procedure(s) Performed: COLONOSCOPY WITH PROPOFOL VIA STOMA AND ANUS (N/A )  Patient Location: PACU  Anesthesia Type:MAC  Level of Consciousness: awake, alert  and oriented  Airway & Oxygen Therapy: Patient Spontanous Breathing and Patient connected to face mask oxygen  Post-op Assessment: Report given to RN  Post vital signs: Reviewed and stable  Last Vitals:  Vitals:   10/16/17 0823  BP: (!) 154/74  Pulse: 90  Resp: 17  Temp: 36.7 C  SpO2: 99%    Last Pain:  Vitals:   10/16/17 0823  TempSrc: Oral         Complications: No apparent anesthesia complications

## 2017-10-16 NOTE — Discharge Instructions (Signed)
Colonoscopy, Adult, Care After °This sheet gives you information about how to care for yourself after your procedure. Your doctor may also give you more specific instructions. If you have problems or questions, call your doctor. °Follow these instructions at home: °General instructions ° °· For the first 24 hours after the procedure: °? Do not drive or use machinery. °? Do not sign important documents. °? Do not drink alcohol. °? Do your daily activities more slowly than normal. °? Eat foods that are soft and easy to digest. °? Rest often. °· Take over-the-counter or prescription medicines only as told by your doctor. °· It is up to you to get the results of your procedure. Ask your doctor, or the department performing the procedure, when your results will be ready. °To help cramping and bloating: °· Try walking around. °· Put heat on your belly (abdomen) as told by your doctor. Use a heat source that your doctor recommends, such as a moist heat pack or a heating pad. °? Put a towel between your skin and the heat source. °? Leave the heat on for 20-30 minutes. °? Remove the heat if your skin turns bright red. This is especially important if you cannot feel pain, heat, or cold. You can get burned. °Eating and drinking °· Drink enough fluid to keep your pee (urine) clear or pale yellow. °· Return to your normal diet as told by your doctor. Avoid heavy or fried foods that are hard to digest. °· Avoid drinking alcohol for as long as told by your doctor. °Contact a doctor if: °· You have blood in your poop (stool) 2-3 days after the procedure. °Get help right away if: °· You have more than a small amount of blood in your poop. °· You see large clumps of tissue (blood clots) in your poop. °· Your belly is swollen. °· You feel sick to your stomach (nauseous). °· You throw up (vomit). °· You have a fever. °· You have belly pain that gets worse, and medicine does not help your pain. °This information is not intended to  replace advice given to you by your health care provider. Make sure you discuss any questions you have with your health care provider. °Document Released: 10/04/2010 Document Revised: 05/26/2016 Document Reviewed: 05/26/2016 °Elsevier Interactive Patient Education © 2017 [employer] °Monitored Anesthesia Care, Care After °These instructions provide you with information about caring for yourself after your procedure. Your health care provider may also give you more specific instructions. Your treatment has been planned according to current medical practices, but problems sometimes occur. Call your health care provider if you have any problems or questions after your procedure. °What can I expect after the procedure? °After your procedure, it is common to: °· Feel sleepy for several hours. °· Feel clumsy and have poor balance for several hours. °· Feel forgetful about what happened after the procedure. °· Have poor judgment for several hours. °· Feel nauseous or vomit. °· Have a sore throat if you had a breathing tube during the procedure. ° °Follow these instructions at home: °For at least 24 hours after the procedure: ° °· Do not: °? Participate in activities in which you could fall or become injured. °? Drive. °? Use heavy machinery. °? Drink alcohol. °? Take sleeping pills or medicines that cause drowsiness. °? Make important decisions or sign legal documents. °? Take care of children on your own. °· Rest. °Eating and drinking °· Follow the diet that is recommended by your health   care provider. °· If you vomit, drink water, juice, or soup when you can drink without vomiting. °· Make sure you have little or no nausea before eating solid foods. °General instructions °· Have a responsible adult stay with you until you are awake and alert. °· Take over-the-counter and prescription medicines only as told by your health care provider. °· If you smoke, do not smoke without supervision. °· Keep all follow-up visits as  told by your health care provider. This is important. °Contact a health care provider if: °· You keep feeling nauseous or you keep vomiting. °· You feel light-headed. °· You develop a rash. °· You have a fever. °Get help right away if: °· You have trouble breathing. °This information is not intended to replace advice given to you by your health care provider. Make sure you discuss any questions you have with your health care provider. °Document Released: 12/23/2015 Document Revised: 04/23/2016 Document Reviewed: 12/23/2015 °Elsevier Interactive Patient Education © 2018 Elsevier Inc. ° °

## 2017-10-19 ENCOUNTER — Encounter (HOSPITAL_COMMUNITY): Payer: Self-pay | Admitting: Surgery

## 2017-10-21 ENCOUNTER — Encounter (INDEPENDENT_AMBULATORY_CARE_PROVIDER_SITE_OTHER): Payer: BLUE CROSS/BLUE SHIELD | Admitting: Ophthalmology

## 2017-10-21 DIAGNOSIS — H34812 Central retinal vein occlusion, left eye, with macular edema: Secondary | ICD-10-CM | POA: Diagnosis not present

## 2017-10-21 DIAGNOSIS — H35033 Hypertensive retinopathy, bilateral: Secondary | ICD-10-CM

## 2017-10-21 DIAGNOSIS — H43813 Vitreous degeneration, bilateral: Secondary | ICD-10-CM

## 2017-10-21 DIAGNOSIS — H2513 Age-related nuclear cataract, bilateral: Secondary | ICD-10-CM

## 2017-10-21 DIAGNOSIS — I10 Essential (primary) hypertension: Secondary | ICD-10-CM

## 2017-11-18 ENCOUNTER — Encounter (INDEPENDENT_AMBULATORY_CARE_PROVIDER_SITE_OTHER): Payer: BLUE CROSS/BLUE SHIELD | Admitting: Ophthalmology

## 2017-11-19 ENCOUNTER — Encounter (INDEPENDENT_AMBULATORY_CARE_PROVIDER_SITE_OTHER): Payer: BLUE CROSS/BLUE SHIELD | Admitting: Ophthalmology

## 2017-11-19 DIAGNOSIS — H35033 Hypertensive retinopathy, bilateral: Secondary | ICD-10-CM | POA: Diagnosis not present

## 2017-11-19 DIAGNOSIS — H43813 Vitreous degeneration, bilateral: Secondary | ICD-10-CM

## 2017-11-19 DIAGNOSIS — H2513 Age-related nuclear cataract, bilateral: Secondary | ICD-10-CM

## 2017-11-19 DIAGNOSIS — H34812 Central retinal vein occlusion, left eye, with macular edema: Secondary | ICD-10-CM | POA: Diagnosis not present

## 2017-11-19 DIAGNOSIS — I10 Essential (primary) hypertension: Secondary | ICD-10-CM | POA: Diagnosis not present

## 2017-12-24 ENCOUNTER — Encounter (INDEPENDENT_AMBULATORY_CARE_PROVIDER_SITE_OTHER): Payer: BLUE CROSS/BLUE SHIELD | Admitting: Ophthalmology

## 2017-12-24 DIAGNOSIS — H2513 Age-related nuclear cataract, bilateral: Secondary | ICD-10-CM | POA: Diagnosis not present

## 2017-12-24 DIAGNOSIS — H34812 Central retinal vein occlusion, left eye, with macular edema: Secondary | ICD-10-CM | POA: Diagnosis not present

## 2017-12-24 DIAGNOSIS — I10 Essential (primary) hypertension: Secondary | ICD-10-CM

## 2017-12-24 DIAGNOSIS — H43813 Vitreous degeneration, bilateral: Secondary | ICD-10-CM | POA: Diagnosis not present

## 2017-12-24 DIAGNOSIS — H35033 Hypertensive retinopathy, bilateral: Secondary | ICD-10-CM | POA: Diagnosis not present

## 2018-01-09 IMAGING — US US RENAL
1 series · 14 of 25 positions shown · non-contrast
Comparison: Noncontrast CT scan of the abdomen and pelvis June 29, 2017

CLINICAL DATA: Elevated serum creatinine

EXAM:
RENAL / URINARY TRACT ULTRASOUND COMPLETE

[Series 1: us renal · 0.26mm/px · 14 of 27 slices shown]
[im 1/27]
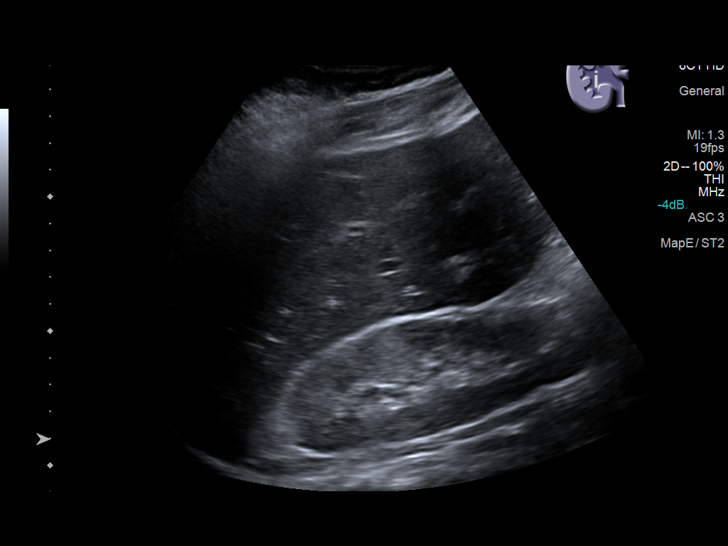
[im 3/27]
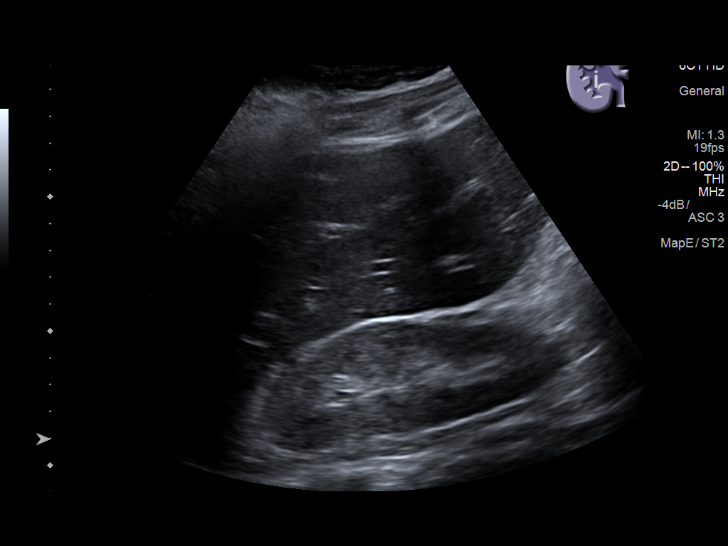
[im 5/27]
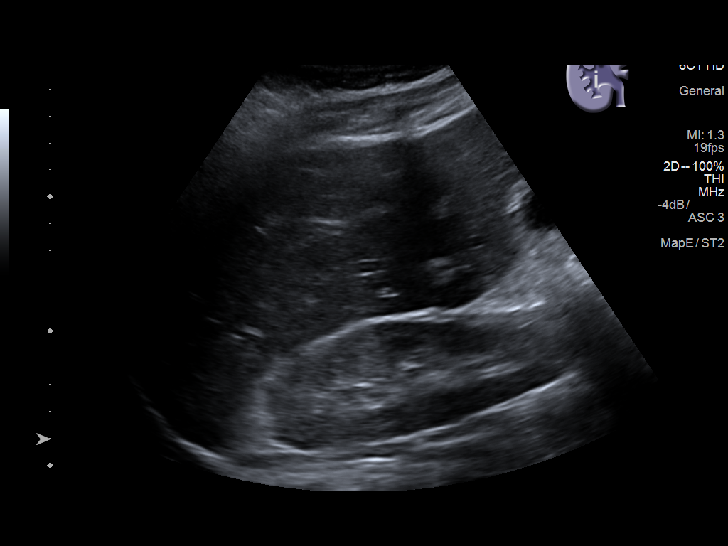
[im 7/27]
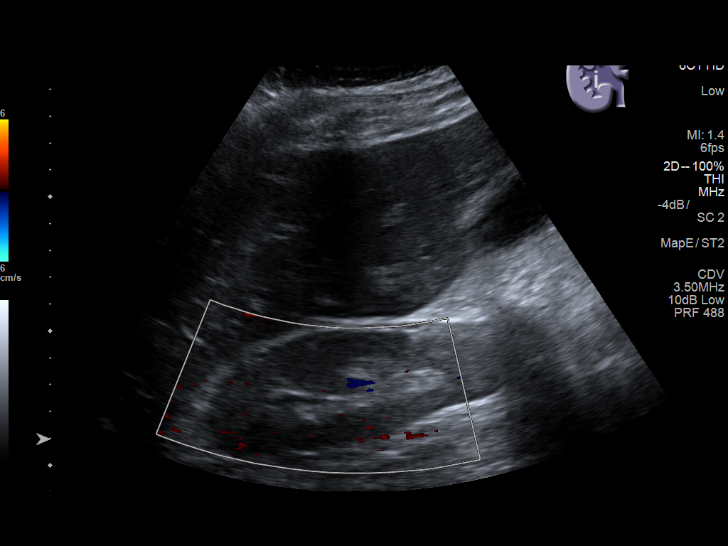
[im 9/27]
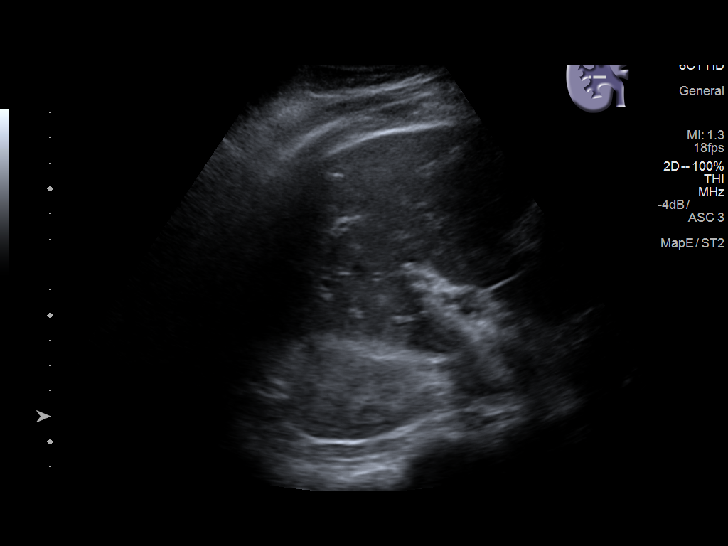
[im 10/27]
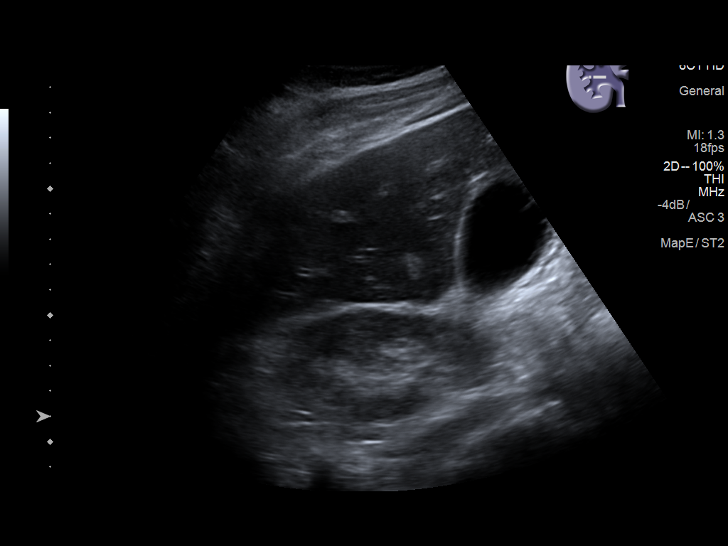
[im 12/27]
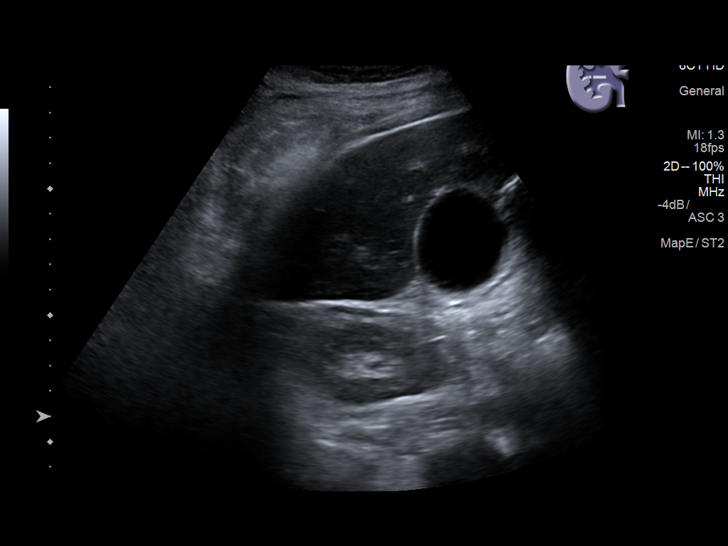
[im 15/27]
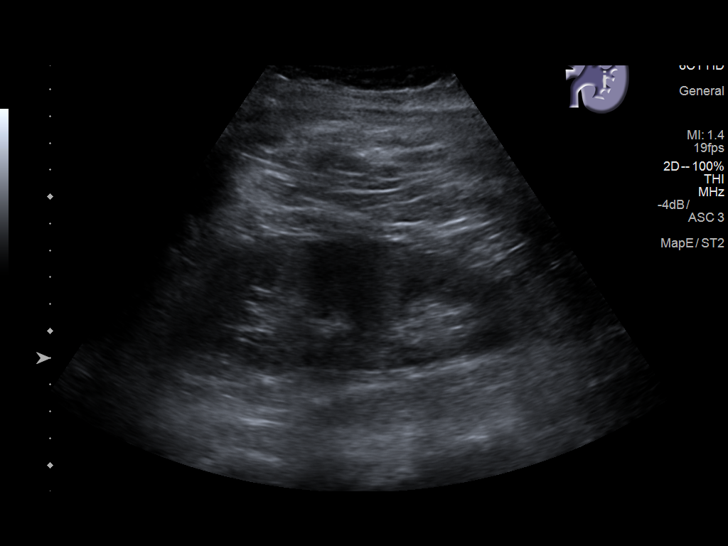
[im 17/27]
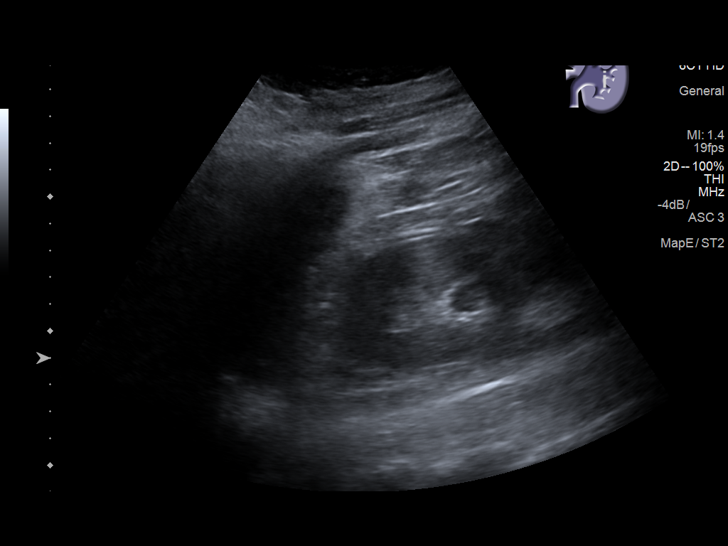
[im 18/27]
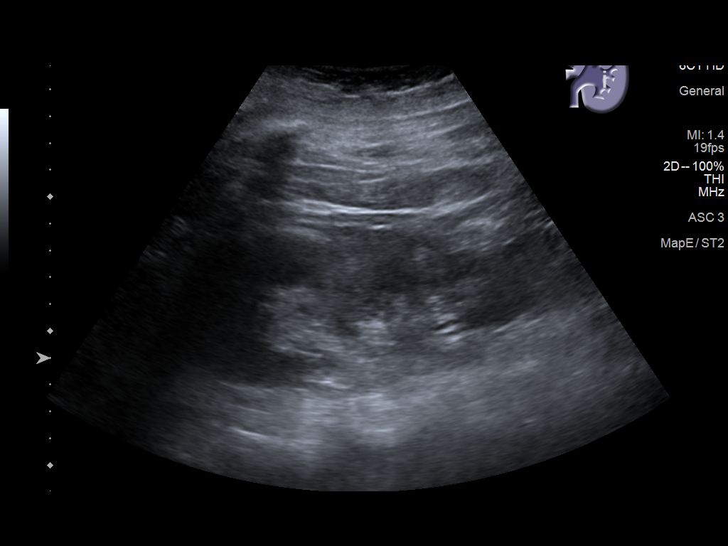
[im 20/27]
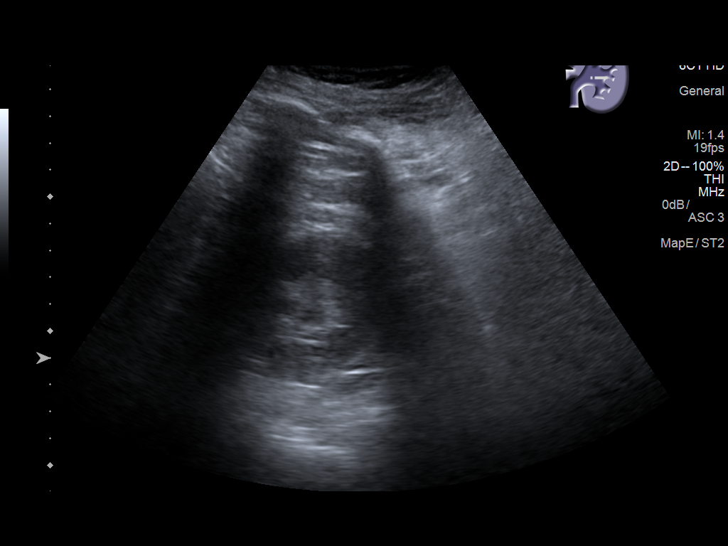
[im 22/27]
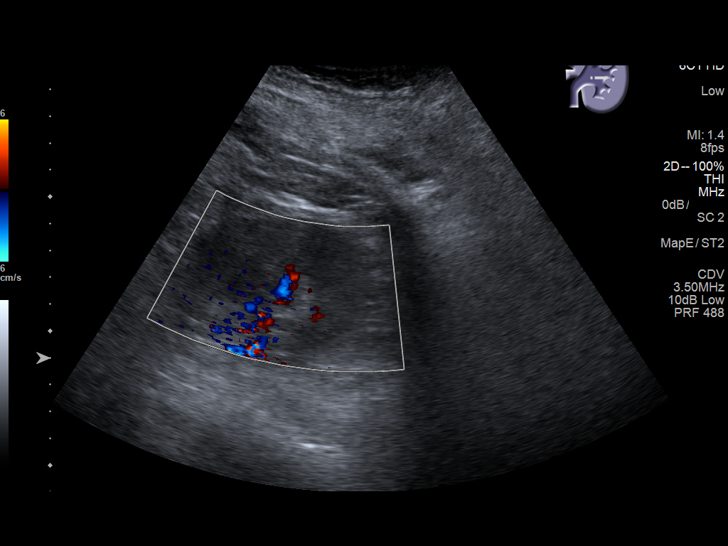
[im 24/27]
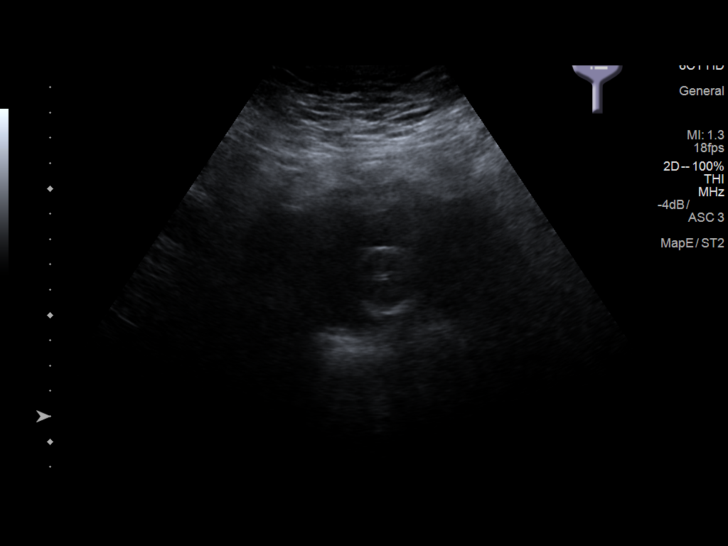
[im 27/27]
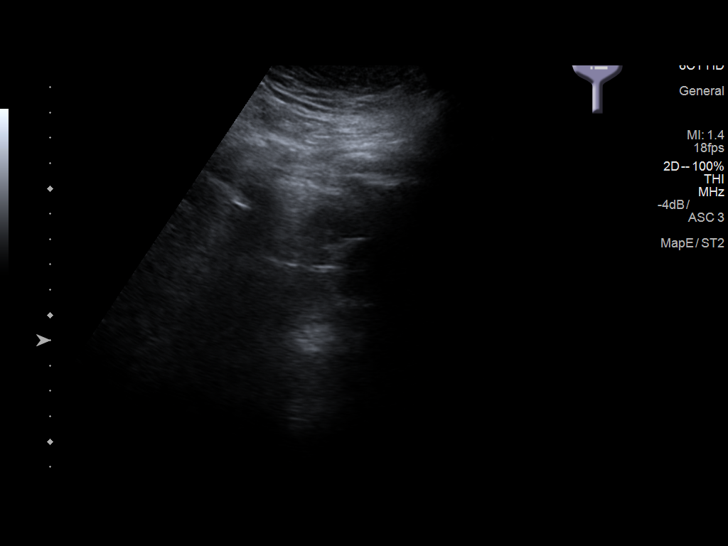

[14 of 25 positions shown; findings below may reference images not displayed]

FINDINGS: Right Kidney:

Length: 11.8 cm. The renal cortical echotexture is increased and is
greater than that of the liver. There is no hydronephrosis.

Left Kidney:

Length: 12.2 cm. The renal cortical echotexture is increased similar
to that on the right. There is no hydronephrosis.

Bladder:

The urinary bladder is decompressed by Foley catheter.
IMPRESSION: Increased renal cortical echotexture bilaterally compatible with
medical renal disease. No hydronephrosis.

## 2018-01-25 ENCOUNTER — Encounter (INDEPENDENT_AMBULATORY_CARE_PROVIDER_SITE_OTHER): Payer: BLUE CROSS/BLUE SHIELD | Admitting: Ophthalmology

## 2018-01-25 DIAGNOSIS — H43813 Vitreous degeneration, bilateral: Secondary | ICD-10-CM

## 2018-01-25 DIAGNOSIS — I10 Essential (primary) hypertension: Secondary | ICD-10-CM | POA: Diagnosis not present

## 2018-01-25 DIAGNOSIS — H34812 Central retinal vein occlusion, left eye, with macular edema: Secondary | ICD-10-CM

## 2018-01-25 DIAGNOSIS — H2513 Age-related nuclear cataract, bilateral: Secondary | ICD-10-CM | POA: Diagnosis not present

## 2018-01-25 DIAGNOSIS — H35033 Hypertensive retinopathy, bilateral: Secondary | ICD-10-CM

## 2018-02-22 ENCOUNTER — Encounter (INDEPENDENT_AMBULATORY_CARE_PROVIDER_SITE_OTHER): Payer: BLUE CROSS/BLUE SHIELD | Admitting: Ophthalmology

## 2018-02-22 DIAGNOSIS — H35033 Hypertensive retinopathy, bilateral: Secondary | ICD-10-CM | POA: Diagnosis not present

## 2018-02-22 DIAGNOSIS — I10 Essential (primary) hypertension: Secondary | ICD-10-CM

## 2018-02-22 DIAGNOSIS — H34812 Central retinal vein occlusion, left eye, with macular edema: Secondary | ICD-10-CM | POA: Diagnosis not present

## 2018-02-22 DIAGNOSIS — H43813 Vitreous degeneration, bilateral: Secondary | ICD-10-CM

## 2018-02-22 DIAGNOSIS — H2513 Age-related nuclear cataract, bilateral: Secondary | ICD-10-CM

## 2018-02-22 DIAGNOSIS — H35372 Puckering of macula, left eye: Secondary | ICD-10-CM

## 2018-03-22 ENCOUNTER — Encounter (INDEPENDENT_AMBULATORY_CARE_PROVIDER_SITE_OTHER): Payer: Medicare Other | Admitting: Ophthalmology

## 2018-03-22 DIAGNOSIS — H2513 Age-related nuclear cataract, bilateral: Secondary | ICD-10-CM | POA: Diagnosis not present

## 2018-03-22 DIAGNOSIS — I10 Essential (primary) hypertension: Secondary | ICD-10-CM

## 2018-03-22 DIAGNOSIS — H34812 Central retinal vein occlusion, left eye, with macular edema: Secondary | ICD-10-CM | POA: Diagnosis not present

## 2018-03-22 DIAGNOSIS — H35033 Hypertensive retinopathy, bilateral: Secondary | ICD-10-CM

## 2018-03-22 DIAGNOSIS — H43813 Vitreous degeneration, bilateral: Secondary | ICD-10-CM | POA: Diagnosis not present

## 2018-04-19 ENCOUNTER — Encounter (INDEPENDENT_AMBULATORY_CARE_PROVIDER_SITE_OTHER): Payer: Medicare Other | Admitting: Ophthalmology

## 2018-04-19 DIAGNOSIS — H34812 Central retinal vein occlusion, left eye, with macular edema: Secondary | ICD-10-CM

## 2018-04-19 DIAGNOSIS — H2513 Age-related nuclear cataract, bilateral: Secondary | ICD-10-CM | POA: Diagnosis not present

## 2018-04-19 DIAGNOSIS — H43813 Vitreous degeneration, bilateral: Secondary | ICD-10-CM

## 2018-04-19 DIAGNOSIS — I10 Essential (primary) hypertension: Secondary | ICD-10-CM | POA: Diagnosis not present

## 2018-04-19 DIAGNOSIS — H35033 Hypertensive retinopathy, bilateral: Secondary | ICD-10-CM

## 2018-04-20 DIAGNOSIS — H40052 Ocular hypertension, left eye: Secondary | ICD-10-CM | POA: Diagnosis not present

## 2018-04-20 DIAGNOSIS — H40013 Open angle with borderline findings, low risk, bilateral: Secondary | ICD-10-CM | POA: Diagnosis not present

## 2018-04-27 ENCOUNTER — Ambulatory Visit
Admission: RE | Admit: 2018-04-27 | Discharge: 2018-04-27 | Disposition: A | Discharge status: Home / Self Care | Payer: Medicare Other | Source: Ambulatory Visit | Attending: Surgery | Admitting: Surgery

## 2018-04-27 DIAGNOSIS — Z933 Colostomy status: Secondary | ICD-10-CM

## 2018-04-27 DIAGNOSIS — Z433 Encounter for attention to colostomy: Secondary | ICD-10-CM | POA: Diagnosis not present

## 2018-05-04 DIAGNOSIS — Z933 Colostomy status: Secondary | ICD-10-CM | POA: Diagnosis not present

## 2018-05-18 ENCOUNTER — Encounter (INDEPENDENT_AMBULATORY_CARE_PROVIDER_SITE_OTHER): Payer: Medicare Other | Admitting: Ophthalmology

## 2018-05-18 DIAGNOSIS — H34812 Central retinal vein occlusion, left eye, with macular edema: Secondary | ICD-10-CM

## 2018-05-18 DIAGNOSIS — H35033 Hypertensive retinopathy, bilateral: Secondary | ICD-10-CM

## 2018-05-18 DIAGNOSIS — H43813 Vitreous degeneration, bilateral: Secondary | ICD-10-CM

## 2018-05-18 DIAGNOSIS — I10 Essential (primary) hypertension: Secondary | ICD-10-CM

## 2018-05-21 DIAGNOSIS — Z933 Colostomy status: Secondary | ICD-10-CM | POA: Diagnosis not present

## 2018-06-15 ENCOUNTER — Encounter (INDEPENDENT_AMBULATORY_CARE_PROVIDER_SITE_OTHER): Payer: Medicare Other | Admitting: Ophthalmology

## 2018-06-15 DIAGNOSIS — H43813 Vitreous degeneration, bilateral: Secondary | ICD-10-CM | POA: Diagnosis not present

## 2018-06-15 DIAGNOSIS — H2513 Age-related nuclear cataract, bilateral: Secondary | ICD-10-CM

## 2018-06-15 DIAGNOSIS — H35033 Hypertensive retinopathy, bilateral: Secondary | ICD-10-CM | POA: Diagnosis not present

## 2018-06-15 DIAGNOSIS — I10 Essential (primary) hypertension: Secondary | ICD-10-CM

## 2018-06-15 DIAGNOSIS — H34812 Central retinal vein occlusion, left eye, with macular edema: Secondary | ICD-10-CM | POA: Diagnosis not present

## 2018-07-13 ENCOUNTER — Encounter (INDEPENDENT_AMBULATORY_CARE_PROVIDER_SITE_OTHER): Payer: Medicare Other | Admitting: Ophthalmology

## 2018-07-13 DIAGNOSIS — I10 Essential (primary) hypertension: Secondary | ICD-10-CM

## 2018-07-13 DIAGNOSIS — H35033 Hypertensive retinopathy, bilateral: Secondary | ICD-10-CM | POA: Diagnosis not present

## 2018-07-13 DIAGNOSIS — H2513 Age-related nuclear cataract, bilateral: Secondary | ICD-10-CM | POA: Diagnosis not present

## 2018-07-13 DIAGNOSIS — H34812 Central retinal vein occlusion, left eye, with macular edema: Secondary | ICD-10-CM

## 2018-07-13 DIAGNOSIS — H43813 Vitreous degeneration, bilateral: Secondary | ICD-10-CM

## 2018-08-04 DIAGNOSIS — D509 Iron deficiency anemia, unspecified: Secondary | ICD-10-CM | POA: Diagnosis not present

## 2018-08-04 DIAGNOSIS — E782 Mixed hyperlipidemia: Secondary | ICD-10-CM | POA: Diagnosis not present

## 2018-08-04 DIAGNOSIS — I10 Essential (primary) hypertension: Secondary | ICD-10-CM | POA: Diagnosis not present

## 2018-08-10 ENCOUNTER — Encounter (INDEPENDENT_AMBULATORY_CARE_PROVIDER_SITE_OTHER): Payer: Medicare Other | Admitting: Ophthalmology

## 2018-08-11 ENCOUNTER — Encounter (INDEPENDENT_AMBULATORY_CARE_PROVIDER_SITE_OTHER): Payer: Medicare Other | Admitting: Ophthalmology

## 2018-08-11 DIAGNOSIS — I10 Essential (primary) hypertension: Secondary | ICD-10-CM

## 2018-08-11 DIAGNOSIS — H43813 Vitreous degeneration, bilateral: Secondary | ICD-10-CM

## 2018-08-11 DIAGNOSIS — H2513 Age-related nuclear cataract, bilateral: Secondary | ICD-10-CM | POA: Diagnosis not present

## 2018-08-11 DIAGNOSIS — H34812 Central retinal vein occlusion, left eye, with macular edema: Secondary | ICD-10-CM

## 2018-08-11 DIAGNOSIS — H35033 Hypertensive retinopathy, bilateral: Secondary | ICD-10-CM | POA: Diagnosis not present

## 2018-09-06 ENCOUNTER — Encounter (INDEPENDENT_AMBULATORY_CARE_PROVIDER_SITE_OTHER): Payer: Medicare Other | Admitting: Ophthalmology

## 2018-09-06 DIAGNOSIS — H34812 Central retinal vein occlusion, left eye, with macular edema: Secondary | ICD-10-CM | POA: Diagnosis not present

## 2018-09-06 DIAGNOSIS — H35033 Hypertensive retinopathy, bilateral: Secondary | ICD-10-CM | POA: Diagnosis not present

## 2018-09-06 DIAGNOSIS — H43813 Vitreous degeneration, bilateral: Secondary | ICD-10-CM

## 2018-09-06 DIAGNOSIS — I10 Essential (primary) hypertension: Secondary | ICD-10-CM

## 2018-10-08 DIAGNOSIS — Z0000 Encounter for general adult medical examination without abnormal findings: Secondary | ICD-10-CM | POA: Diagnosis not present

## 2018-10-08 DIAGNOSIS — R809 Proteinuria, unspecified: Secondary | ICD-10-CM | POA: Diagnosis not present

## 2018-10-08 DIAGNOSIS — I10 Essential (primary) hypertension: Secondary | ICD-10-CM | POA: Diagnosis not present

## 2018-10-08 DIAGNOSIS — E6609 Other obesity due to excess calories: Secondary | ICD-10-CM | POA: Diagnosis not present

## 2018-10-08 DIAGNOSIS — N183 Chronic kidney disease, stage 3 (moderate): Secondary | ICD-10-CM | POA: Diagnosis not present

## 2018-10-08 DIAGNOSIS — R600 Localized edema: Secondary | ICD-10-CM | POA: Diagnosis not present

## 2018-10-08 DIAGNOSIS — I83009 Varicose veins of unspecified lower extremity with ulcer of unspecified site: Secondary | ICD-10-CM | POA: Diagnosis not present

## 2018-10-08 DIAGNOSIS — E782 Mixed hyperlipidemia: Secondary | ICD-10-CM | POA: Diagnosis not present

## 2018-10-08 DIAGNOSIS — I129 Hypertensive chronic kidney disease with stage 1 through stage 4 chronic kidney disease, or unspecified chronic kidney disease: Secondary | ICD-10-CM | POA: Diagnosis not present

## 2018-10-08 DIAGNOSIS — Z933 Colostomy status: Secondary | ICD-10-CM | POA: Diagnosis not present

## 2018-10-13 ENCOUNTER — Encounter (INDEPENDENT_AMBULATORY_CARE_PROVIDER_SITE_OTHER): Payer: Medicare Other | Admitting: Ophthalmology

## 2018-10-13 DIAGNOSIS — H35033 Hypertensive retinopathy, bilateral: Secondary | ICD-10-CM

## 2018-10-13 DIAGNOSIS — I10 Essential (primary) hypertension: Secondary | ICD-10-CM

## 2018-10-13 DIAGNOSIS — H43813 Vitreous degeneration, bilateral: Secondary | ICD-10-CM | POA: Diagnosis not present

## 2018-10-13 DIAGNOSIS — H34812 Central retinal vein occlusion, left eye, with macular edema: Secondary | ICD-10-CM | POA: Diagnosis not present

## 2018-10-21 DIAGNOSIS — H40013 Open angle with borderline findings, low risk, bilateral: Secondary | ICD-10-CM | POA: Diagnosis not present

## 2018-10-21 DIAGNOSIS — H35033 Hypertensive retinopathy, bilateral: Secondary | ICD-10-CM | POA: Diagnosis not present

## 2018-10-21 DIAGNOSIS — H25013 Cortical age-related cataract, bilateral: Secondary | ICD-10-CM | POA: Diagnosis not present

## 2018-10-21 DIAGNOSIS — H40052 Ocular hypertension, left eye: Secondary | ICD-10-CM | POA: Diagnosis not present

## 2018-11-10 ENCOUNTER — Encounter (INDEPENDENT_AMBULATORY_CARE_PROVIDER_SITE_OTHER): Payer: Medicare Other | Admitting: Ophthalmology

## 2018-11-10 DIAGNOSIS — H43813 Vitreous degeneration, bilateral: Secondary | ICD-10-CM | POA: Diagnosis not present

## 2018-11-10 DIAGNOSIS — I10 Essential (primary) hypertension: Secondary | ICD-10-CM | POA: Diagnosis not present

## 2018-11-10 DIAGNOSIS — H2513 Age-related nuclear cataract, bilateral: Secondary | ICD-10-CM | POA: Diagnosis not present

## 2018-11-10 DIAGNOSIS — H34812 Central retinal vein occlusion, left eye, with macular edema: Secondary | ICD-10-CM

## 2018-11-10 DIAGNOSIS — H35033 Hypertensive retinopathy, bilateral: Secondary | ICD-10-CM | POA: Diagnosis not present

## 2018-12-07 ENCOUNTER — Other Ambulatory Visit: Payer: Self-pay

## 2018-12-07 ENCOUNTER — Encounter (INDEPENDENT_AMBULATORY_CARE_PROVIDER_SITE_OTHER): Payer: Medicare Other | Admitting: Ophthalmology

## 2018-12-07 DIAGNOSIS — H43813 Vitreous degeneration, bilateral: Secondary | ICD-10-CM

## 2018-12-07 DIAGNOSIS — H34812 Central retinal vein occlusion, left eye, with macular edema: Secondary | ICD-10-CM | POA: Diagnosis not present

## 2018-12-07 DIAGNOSIS — H2513 Age-related nuclear cataract, bilateral: Secondary | ICD-10-CM | POA: Diagnosis not present

## 2018-12-07 DIAGNOSIS — I10 Essential (primary) hypertension: Secondary | ICD-10-CM | POA: Diagnosis not present

## 2018-12-07 DIAGNOSIS — H35033 Hypertensive retinopathy, bilateral: Secondary | ICD-10-CM

## 2019-01-04 ENCOUNTER — Encounter (INDEPENDENT_AMBULATORY_CARE_PROVIDER_SITE_OTHER): Payer: Medicare Other | Admitting: Ophthalmology

## 2019-01-05 ENCOUNTER — Other Ambulatory Visit: Payer: Self-pay

## 2019-01-05 ENCOUNTER — Encounter (INDEPENDENT_AMBULATORY_CARE_PROVIDER_SITE_OTHER): Payer: Medicare Other | Admitting: Ophthalmology

## 2019-01-05 DIAGNOSIS — H34812 Central retinal vein occlusion, left eye, with macular edema: Secondary | ICD-10-CM

## 2019-01-05 DIAGNOSIS — I10 Essential (primary) hypertension: Secondary | ICD-10-CM

## 2019-01-05 DIAGNOSIS — H2513 Age-related nuclear cataract, bilateral: Secondary | ICD-10-CM | POA: Diagnosis not present

## 2019-01-05 DIAGNOSIS — H35033 Hypertensive retinopathy, bilateral: Secondary | ICD-10-CM

## 2019-01-05 DIAGNOSIS — H43813 Vitreous degeneration, bilateral: Secondary | ICD-10-CM

## 2019-02-09 ENCOUNTER — Encounter (INDEPENDENT_AMBULATORY_CARE_PROVIDER_SITE_OTHER): Payer: Medicare Other | Admitting: Ophthalmology

## 2019-02-09 ENCOUNTER — Other Ambulatory Visit: Payer: Self-pay

## 2019-02-09 DIAGNOSIS — H43813 Vitreous degeneration, bilateral: Secondary | ICD-10-CM

## 2019-02-09 DIAGNOSIS — H34812 Central retinal vein occlusion, left eye, with macular edema: Secondary | ICD-10-CM

## 2019-02-09 DIAGNOSIS — H2513 Age-related nuclear cataract, bilateral: Secondary | ICD-10-CM | POA: Diagnosis not present

## 2019-02-09 DIAGNOSIS — I10 Essential (primary) hypertension: Secondary | ICD-10-CM | POA: Diagnosis not present

## 2019-02-09 DIAGNOSIS — H35033 Hypertensive retinopathy, bilateral: Secondary | ICD-10-CM | POA: Diagnosis not present

## 2019-03-10 ENCOUNTER — Other Ambulatory Visit: Payer: Self-pay

## 2019-03-10 ENCOUNTER — Encounter (INDEPENDENT_AMBULATORY_CARE_PROVIDER_SITE_OTHER): Payer: Medicare Other | Admitting: Ophthalmology

## 2019-03-10 DIAGNOSIS — H2513 Age-related nuclear cataract, bilateral: Secondary | ICD-10-CM | POA: Diagnosis not present

## 2019-03-10 DIAGNOSIS — I10 Essential (primary) hypertension: Secondary | ICD-10-CM

## 2019-03-10 DIAGNOSIS — H35033 Hypertensive retinopathy, bilateral: Secondary | ICD-10-CM

## 2019-03-10 DIAGNOSIS — H34812 Central retinal vein occlusion, left eye, with macular edema: Secondary | ICD-10-CM

## 2019-03-10 DIAGNOSIS — H43813 Vitreous degeneration, bilateral: Secondary | ICD-10-CM

## 2019-04-07 ENCOUNTER — Encounter (INDEPENDENT_AMBULATORY_CARE_PROVIDER_SITE_OTHER): Payer: Medicare Other | Admitting: Ophthalmology

## 2019-04-07 ENCOUNTER — Other Ambulatory Visit: Payer: Self-pay

## 2019-04-07 DIAGNOSIS — H43813 Vitreous degeneration, bilateral: Secondary | ICD-10-CM

## 2019-04-07 DIAGNOSIS — H34812 Central retinal vein occlusion, left eye, with macular edema: Secondary | ICD-10-CM | POA: Diagnosis not present

## 2019-04-07 DIAGNOSIS — H35033 Hypertensive retinopathy, bilateral: Secondary | ICD-10-CM

## 2019-04-07 DIAGNOSIS — I10 Essential (primary) hypertension: Secondary | ICD-10-CM | POA: Diagnosis not present

## 2019-04-21 DIAGNOSIS — H40013 Open angle with borderline findings, low risk, bilateral: Secondary | ICD-10-CM | POA: Diagnosis not present

## 2019-05-02 DIAGNOSIS — E782 Mixed hyperlipidemia: Secondary | ICD-10-CM | POA: Diagnosis not present

## 2019-05-02 DIAGNOSIS — I129 Hypertensive chronic kidney disease with stage 1 through stage 4 chronic kidney disease, or unspecified chronic kidney disease: Secondary | ICD-10-CM | POA: Diagnosis not present

## 2019-05-05 ENCOUNTER — Other Ambulatory Visit: Payer: Self-pay

## 2019-05-05 ENCOUNTER — Encounter (INDEPENDENT_AMBULATORY_CARE_PROVIDER_SITE_OTHER): Payer: Medicare Other | Admitting: Ophthalmology

## 2019-05-05 DIAGNOSIS — H35033 Hypertensive retinopathy, bilateral: Secondary | ICD-10-CM

## 2019-05-05 DIAGNOSIS — H43813 Vitreous degeneration, bilateral: Secondary | ICD-10-CM

## 2019-05-05 DIAGNOSIS — H2513 Age-related nuclear cataract, bilateral: Secondary | ICD-10-CM

## 2019-05-05 DIAGNOSIS — H34812 Central retinal vein occlusion, left eye, with macular edema: Secondary | ICD-10-CM | POA: Diagnosis not present

## 2019-05-05 DIAGNOSIS — I10 Essential (primary) hypertension: Secondary | ICD-10-CM | POA: Diagnosis not present

## 2019-05-06 DIAGNOSIS — R7303 Prediabetes: Secondary | ICD-10-CM | POA: Diagnosis not present

## 2019-05-06 DIAGNOSIS — E6609 Other obesity due to excess calories: Secondary | ICD-10-CM | POA: Diagnosis not present

## 2019-05-06 DIAGNOSIS — E782 Mixed hyperlipidemia: Secondary | ICD-10-CM | POA: Diagnosis not present

## 2019-05-06 DIAGNOSIS — Z7189 Other specified counseling: Secondary | ICD-10-CM | POA: Diagnosis not present

## 2019-05-06 DIAGNOSIS — I10 Essential (primary) hypertension: Secondary | ICD-10-CM | POA: Diagnosis not present

## 2019-06-02 ENCOUNTER — Encounter (INDEPENDENT_AMBULATORY_CARE_PROVIDER_SITE_OTHER): Payer: Medicare Other | Admitting: Ophthalmology

## 2019-06-02 ENCOUNTER — Other Ambulatory Visit: Payer: Self-pay

## 2019-06-02 DIAGNOSIS — I10 Essential (primary) hypertension: Secondary | ICD-10-CM

## 2019-06-02 DIAGNOSIS — H43813 Vitreous degeneration, bilateral: Secondary | ICD-10-CM | POA: Diagnosis not present

## 2019-06-02 DIAGNOSIS — H34812 Central retinal vein occlusion, left eye, with macular edema: Secondary | ICD-10-CM | POA: Diagnosis not present

## 2019-06-02 DIAGNOSIS — H35033 Hypertensive retinopathy, bilateral: Secondary | ICD-10-CM | POA: Diagnosis not present

## 2019-06-02 DIAGNOSIS — H2513 Age-related nuclear cataract, bilateral: Secondary | ICD-10-CM | POA: Diagnosis not present

## 2019-06-30 ENCOUNTER — Encounter (INDEPENDENT_AMBULATORY_CARE_PROVIDER_SITE_OTHER): Payer: Medicare Other | Admitting: Ophthalmology

## 2019-06-30 DIAGNOSIS — I10 Essential (primary) hypertension: Secondary | ICD-10-CM | POA: Diagnosis not present

## 2019-06-30 DIAGNOSIS — H35033 Hypertensive retinopathy, bilateral: Secondary | ICD-10-CM | POA: Diagnosis not present

## 2019-06-30 DIAGNOSIS — H34812 Central retinal vein occlusion, left eye, with macular edema: Secondary | ICD-10-CM

## 2019-06-30 DIAGNOSIS — H43813 Vitreous degeneration, bilateral: Secondary | ICD-10-CM

## 2019-07-28 ENCOUNTER — Encounter (INDEPENDENT_AMBULATORY_CARE_PROVIDER_SITE_OTHER): Payer: Medicare Other | Admitting: Ophthalmology

## 2019-07-28 DIAGNOSIS — H34812 Central retinal vein occlusion, left eye, with macular edema: Secondary | ICD-10-CM

## 2019-07-28 DIAGNOSIS — H2513 Age-related nuclear cataract, bilateral: Secondary | ICD-10-CM | POA: Diagnosis not present

## 2019-07-28 DIAGNOSIS — H35033 Hypertensive retinopathy, bilateral: Secondary | ICD-10-CM

## 2019-07-28 DIAGNOSIS — H43813 Vitreous degeneration, bilateral: Secondary | ICD-10-CM | POA: Diagnosis not present

## 2019-07-28 DIAGNOSIS — I10 Essential (primary) hypertension: Secondary | ICD-10-CM | POA: Diagnosis not present

## 2019-08-24 IMAGING — DX DG CHEST 1V PORT
1 series · 1 of 1 positions shown · non-contrast
Comparison: 06/29/2017

CLINICAL DATA: Central line placement

EXAM:
PORTABLE CHEST 1 VIEW

[chest ap]
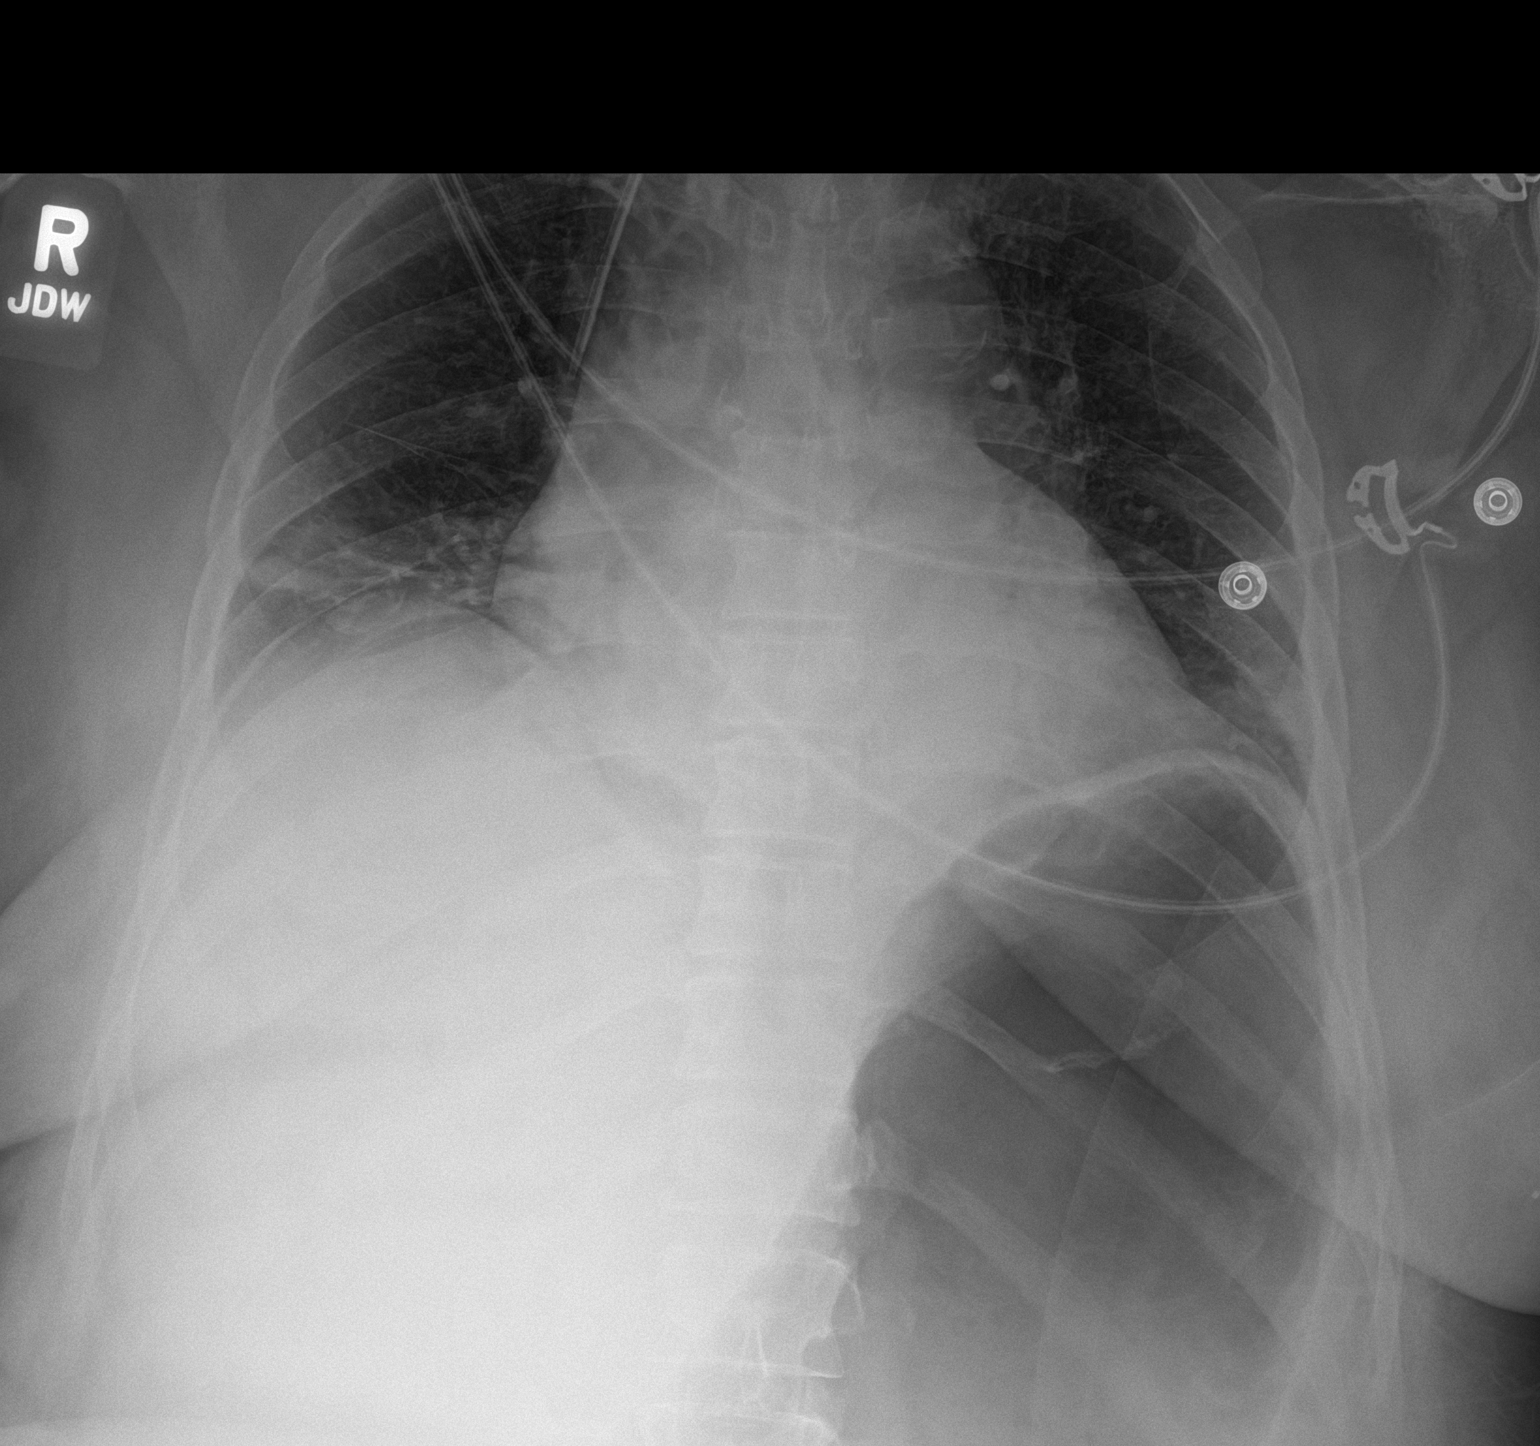

[1 of 1 positions shown; findings below may reference images not displayed]

FINDINGS: A right central venous catheter has been placed. Tip is at the level
of the low SVC but the catheter is projected slightly lateral to the
mediastinal shadow. This may just be due to patient rotation but
can't exclude extravascular location. Correlate for catheter
function with blood return evaluation. No pneumothorax.

Cardiac enlargement without vascular congestion. Atelectasis in the
lung bases. No blunting of costophrenic angles. Calcified aorta.
Gaseous distention of the stomach.
IMPRESSION: Right central venous catheter tip at the level of the low SVC
region. The catheter projects slightly lateral to the mediastinal
shadow, possibly due to patient rotation but correlation with
catheter function and blood return is recommended to exclude
extravascular location. No pneumothorax. Cardiac enlargement with
atelectasis in the lung bases.

## 2019-08-25 DIAGNOSIS — E782 Mixed hyperlipidemia: Secondary | ICD-10-CM | POA: Diagnosis not present

## 2019-08-25 DIAGNOSIS — I10 Essential (primary) hypertension: Secondary | ICD-10-CM | POA: Diagnosis not present

## 2019-08-25 DIAGNOSIS — E6609 Other obesity due to excess calories: Secondary | ICD-10-CM | POA: Diagnosis not present

## 2019-08-26 ENCOUNTER — Encounter (INDEPENDENT_AMBULATORY_CARE_PROVIDER_SITE_OTHER): Payer: Medicare Other | Admitting: Ophthalmology

## 2019-08-26 ENCOUNTER — Other Ambulatory Visit: Payer: Self-pay

## 2019-08-26 DIAGNOSIS — H2513 Age-related nuclear cataract, bilateral: Secondary | ICD-10-CM | POA: Diagnosis not present

## 2019-08-26 DIAGNOSIS — H43813 Vitreous degeneration, bilateral: Secondary | ICD-10-CM | POA: Diagnosis not present

## 2019-08-26 DIAGNOSIS — I10 Essential (primary) hypertension: Secondary | ICD-10-CM

## 2019-08-26 DIAGNOSIS — H35033 Hypertensive retinopathy, bilateral: Secondary | ICD-10-CM | POA: Diagnosis not present

## 2019-08-26 DIAGNOSIS — H34812 Central retinal vein occlusion, left eye, with macular edema: Secondary | ICD-10-CM

## 2019-09-02 IMAGING — CT CT ABD-PELV W/ CM
2 of 5 series · 16 of 46 positions shown, 18 images · IV contrast (iopamidol)
Comparison: 06/29/2017

CLINICAL DATA: Abdominal distention. Evaluate for abscess. Evaluate
for bladder injury.

EXAM:
CT ABDOMEN AND PELVIS WITH CONTRAST
TECHNIQUE: Multidetector CT imaging of the abdomen and pelvis was performed
using the standard protocol following bolus administration of
intravenous contrast.
CONTRAST:  100mL IDIOGC-FMM IOPAMIDOL (IDIOGC-FMM) INJECTION 61%

[Series 3: abd/ pelvis 5.0 i30f 2 · axial · 0.80mm/px · z∈[+1038,+1453]mm · 13 of 97 slices shown, 15 images]
[im 7/97  soft-tissue]
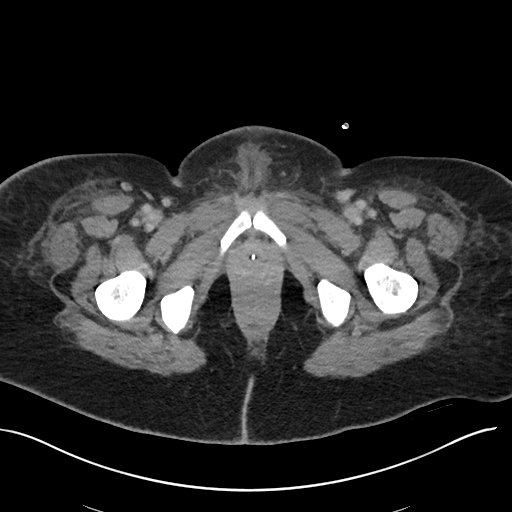
[im 7/97  bone]
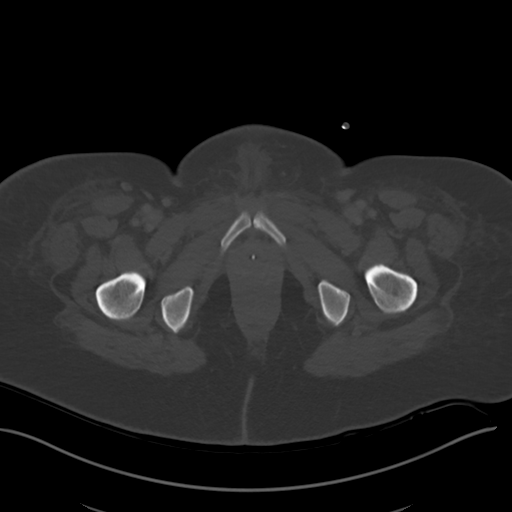
[im 13/97  soft-tissue]
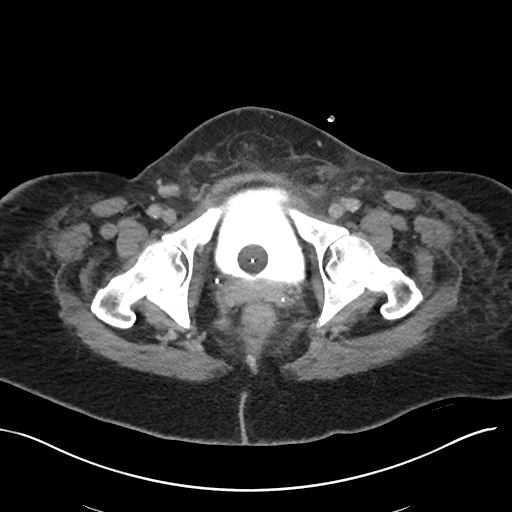
[im 20/97  soft-tissue]
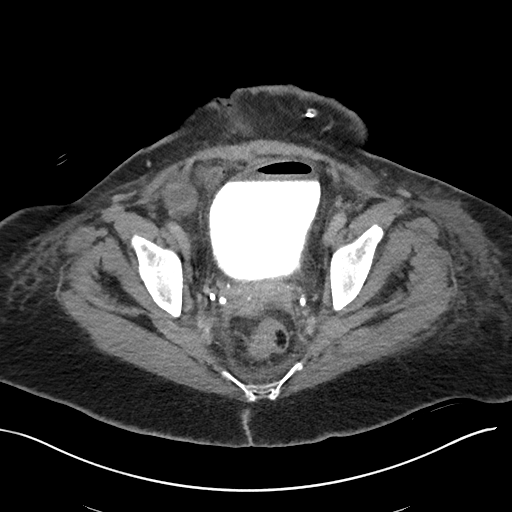
[im 26/97  soft-tissue]
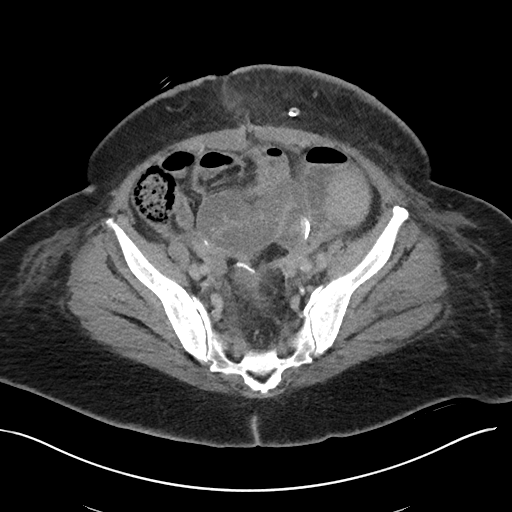
[im 33/97  soft-tissue]
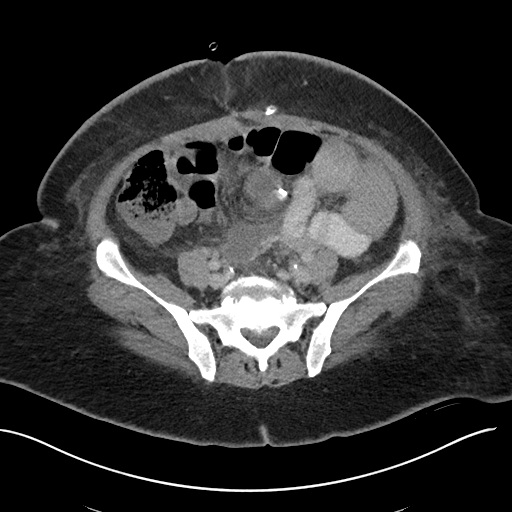
[im 39/97  soft-tissue]
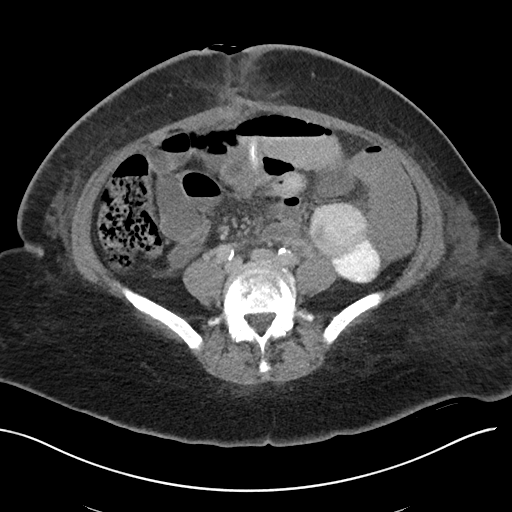
[im 52/97  soft-tissue]
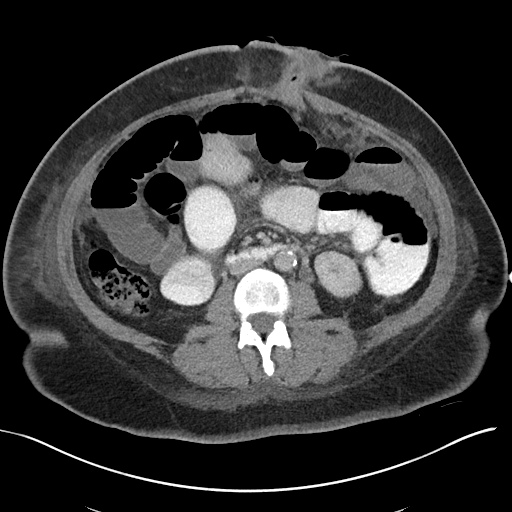
[im 58/97  soft-tissue]
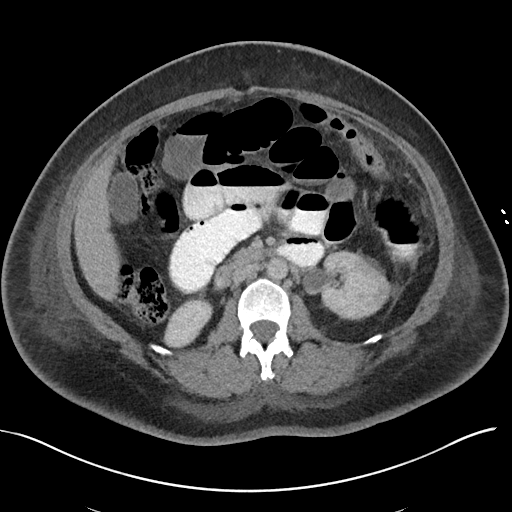
[im 65/97  soft-tissue]
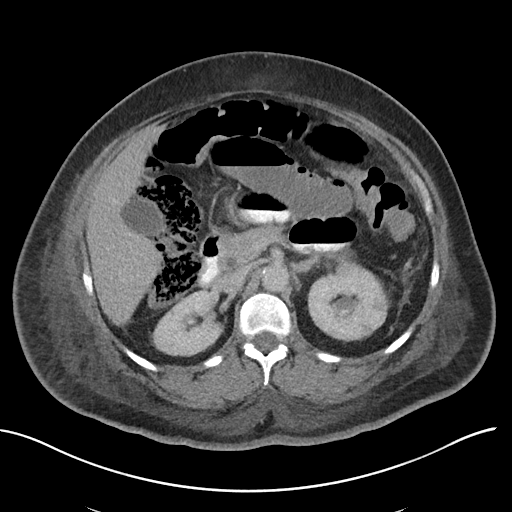
[im 65/97  bone]
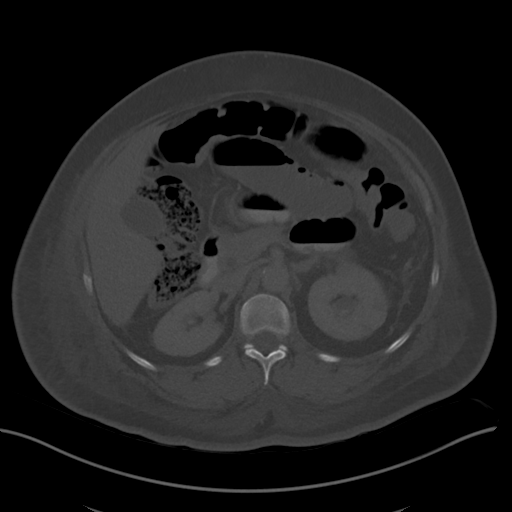
[im 71/97  soft-tissue]
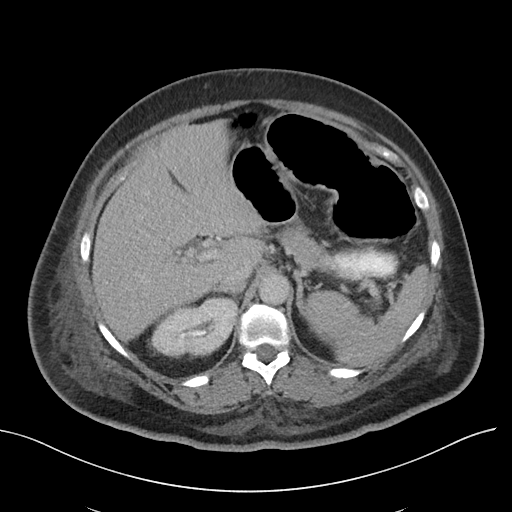
[im 77/97  soft-tissue]
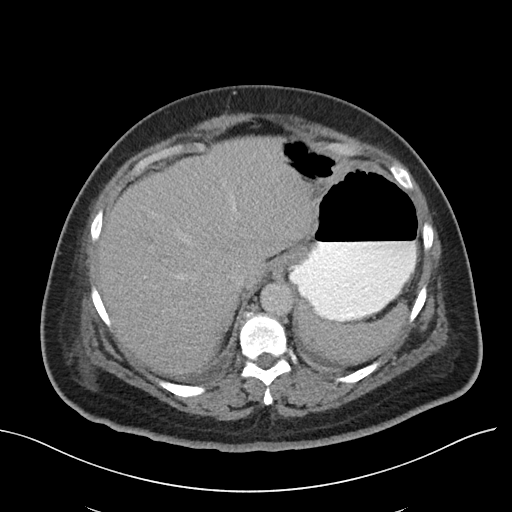
[im 84/97  soft-tissue]
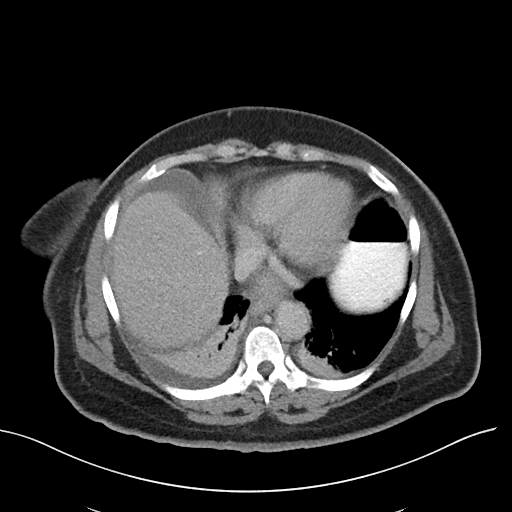
[im 90/97  soft-tissue]
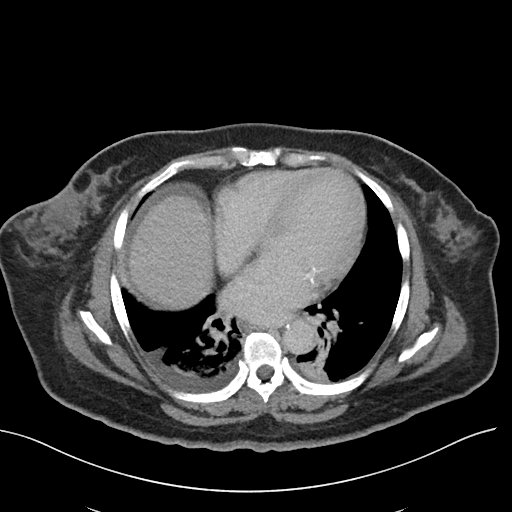

[Series 6: coronal soft tissue · coronal · 0.84mm/px · 3 of 105 slices shown]
[im 35/105  soft-tissue]
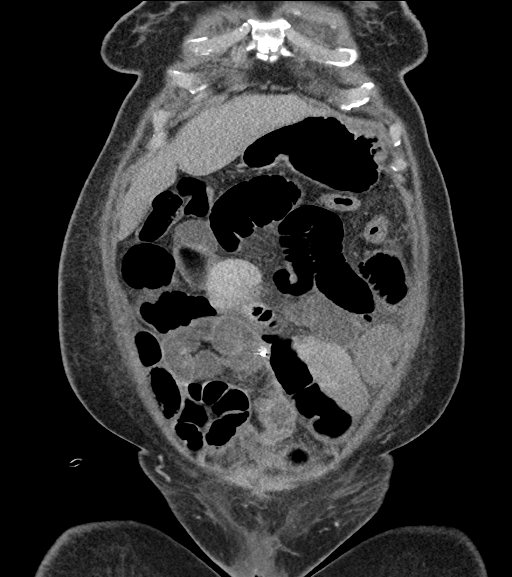
[im 47/105  soft-tissue]
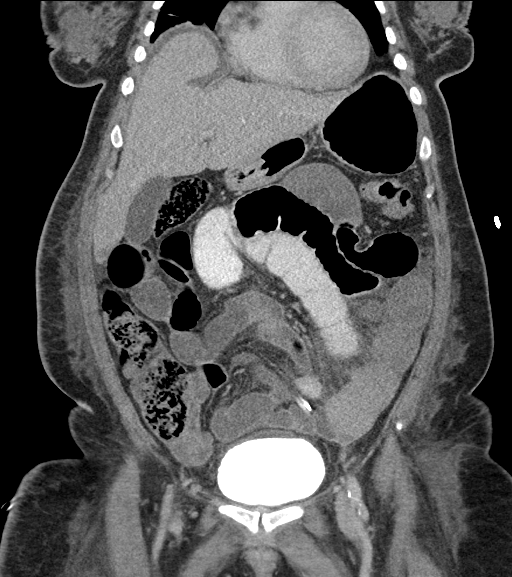
[im 58/105  soft-tissue]
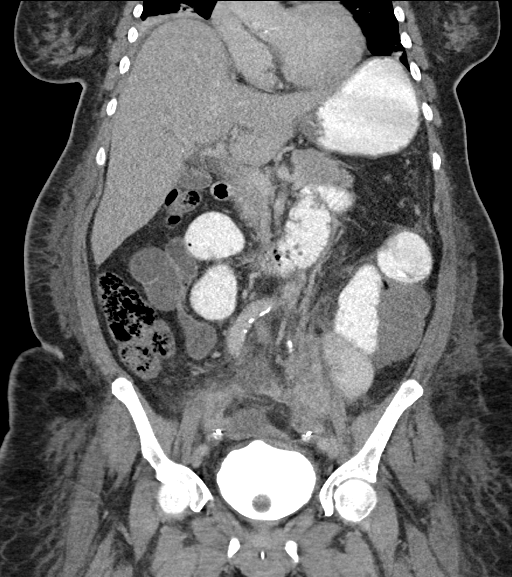

[16 of 46 positions shown; findings below may reference images not displayed]

FINDINGS: Lower chest: Multi segment atelectasis in the lower lobes and right
middle lobe. Trace pleural fluid.

Hepatobiliary: New no evidence of abscess or mass. Coarse
calcification in the right liver that is incidental.No evidence of
biliary obstruction or stone.

Pancreas: Unremarkable.

Spleen: Unremarkable.

Adrenals/Urinary Tract: Negative adrenals. Asymmetric striated
nephrogram on the left. This asymmetric appearance favors
pyelonephritis. Mild left pelviectasis without hydronephrosis or
obstructive process. 250 cc of water-soluble contrast was injected
into the bladder. No extravasation or filling defect is noted. Mild
wall thickening best seen along the dome, nonspecific but possibly
related to renal findings.

Stomach/Bowel: Descending colostomy with Hartmann's pouch. A
surgical drain loops through the pelvis. Proximal small bowel is
diffusely dilated with fluid levels, an overall ileus pattern.
Negative for intra-abdominal abscess. There is small volume
perihepatic ascites that does not appear loculated. The appendix is
difficult to separate from neighboring bowel loops.

Vascular/Lymphatic: No acute vascular abnormality. No mass or
adenopathy.

Reproductive:Hysterectomy.  Negative adnexae.

Other: No pneumoperitoneum.  Anasarca.

Musculoskeletal: No acute abnormalities.
IMPRESSION: 1. Negative for intra-abdominal abscess. Small volume perihepatic
ascites.
2. Diffuse small bowel distention with fluid levels compatible with
ileus.
3. Specific bladder evaluation was requested. The urinary bladder
was filled with contrast and there is no leak or hematoma.
4. Striated left nephrogram as seen with pyelonephritis. Please
correlate with urinalysis.
5. Multi segment atelectasis.

## 2019-09-23 ENCOUNTER — Encounter (INDEPENDENT_AMBULATORY_CARE_PROVIDER_SITE_OTHER): Payer: Medicare HMO | Admitting: Ophthalmology

## 2019-09-23 DIAGNOSIS — H43813 Vitreous degeneration, bilateral: Secondary | ICD-10-CM | POA: Diagnosis not present

## 2019-09-23 DIAGNOSIS — H34812 Central retinal vein occlusion, left eye, with macular edema: Secondary | ICD-10-CM | POA: Diagnosis not present

## 2019-09-23 DIAGNOSIS — H2513 Age-related nuclear cataract, bilateral: Secondary | ICD-10-CM | POA: Diagnosis not present

## 2019-09-23 DIAGNOSIS — I10 Essential (primary) hypertension: Secondary | ICD-10-CM

## 2019-09-23 DIAGNOSIS — H35033 Hypertensive retinopathy, bilateral: Secondary | ICD-10-CM

## 2019-10-19 DIAGNOSIS — Z933 Colostomy status: Secondary | ICD-10-CM | POA: Diagnosis not present

## 2019-10-19 DIAGNOSIS — Z433 Encounter for attention to colostomy: Secondary | ICD-10-CM | POA: Diagnosis not present

## 2019-10-21 ENCOUNTER — Encounter (INDEPENDENT_AMBULATORY_CARE_PROVIDER_SITE_OTHER): Payer: Medicare HMO | Admitting: Ophthalmology

## 2019-10-21 DIAGNOSIS — H43813 Vitreous degeneration, bilateral: Secondary | ICD-10-CM | POA: Diagnosis not present

## 2019-10-21 DIAGNOSIS — H35033 Hypertensive retinopathy, bilateral: Secondary | ICD-10-CM | POA: Diagnosis not present

## 2019-10-21 DIAGNOSIS — H34812 Central retinal vein occlusion, left eye, with macular edema: Secondary | ICD-10-CM | POA: Diagnosis not present

## 2019-10-21 DIAGNOSIS — I10 Essential (primary) hypertension: Secondary | ICD-10-CM | POA: Diagnosis not present

## 2019-10-24 DIAGNOSIS — H25013 Cortical age-related cataract, bilateral: Secondary | ICD-10-CM | POA: Diagnosis not present

## 2019-10-24 DIAGNOSIS — H25042 Posterior subcapsular polar age-related cataract, left eye: Secondary | ICD-10-CM | POA: Diagnosis not present

## 2019-10-24 DIAGNOSIS — H40013 Open angle with borderline findings, low risk, bilateral: Secondary | ICD-10-CM | POA: Diagnosis not present

## 2019-10-24 DIAGNOSIS — H2513 Age-related nuclear cataract, bilateral: Secondary | ICD-10-CM | POA: Diagnosis not present

## 2019-11-18 ENCOUNTER — Encounter (INDEPENDENT_AMBULATORY_CARE_PROVIDER_SITE_OTHER): Payer: No Typology Code available for payment source | Admitting: Ophthalmology

## 2019-11-18 DIAGNOSIS — I10 Essential (primary) hypertension: Secondary | ICD-10-CM

## 2019-11-18 DIAGNOSIS — H35033 Hypertensive retinopathy, bilateral: Secondary | ICD-10-CM

## 2019-11-18 DIAGNOSIS — H43813 Vitreous degeneration, bilateral: Secondary | ICD-10-CM | POA: Diagnosis not present

## 2019-11-18 DIAGNOSIS — H34812 Central retinal vein occlusion, left eye, with macular edema: Secondary | ICD-10-CM | POA: Diagnosis not present

## 2019-12-15 DIAGNOSIS — Z0000 Encounter for general adult medical examination without abnormal findings: Secondary | ICD-10-CM | POA: Diagnosis not present

## 2019-12-15 DIAGNOSIS — R600 Localized edema: Secondary | ICD-10-CM | POA: Diagnosis not present

## 2019-12-15 DIAGNOSIS — N182 Chronic kidney disease, stage 2 (mild): Secondary | ICD-10-CM | POA: Diagnosis not present

## 2019-12-15 DIAGNOSIS — I10 Essential (primary) hypertension: Secondary | ICD-10-CM | POA: Diagnosis not present

## 2019-12-15 DIAGNOSIS — E782 Mixed hyperlipidemia: Secondary | ICD-10-CM | POA: Diagnosis not present

## 2019-12-15 DIAGNOSIS — R7303 Prediabetes: Secondary | ICD-10-CM | POA: Diagnosis not present

## 2019-12-15 DIAGNOSIS — I83009 Varicose veins of unspecified lower extremity with ulcer of unspecified site: Secondary | ICD-10-CM | POA: Diagnosis not present

## 2019-12-19 DIAGNOSIS — I10 Essential (primary) hypertension: Secondary | ICD-10-CM | POA: Diagnosis not present

## 2019-12-19 DIAGNOSIS — E782 Mixed hyperlipidemia: Secondary | ICD-10-CM | POA: Diagnosis not present

## 2019-12-22 ENCOUNTER — Ambulatory Visit: Payer: Medicare Other

## 2019-12-22 ENCOUNTER — Ambulatory Visit: Payer: Medicare HMO | Attending: Internal Medicine

## 2019-12-22 DIAGNOSIS — Z23 Encounter for immunization: Secondary | ICD-10-CM

## 2019-12-22 NOTE — Progress Notes (Signed)
   Covid-19 Vaccination Clinic  Name:  Lindsay [name]    MRN: [id number] DOB: 1951-04-07  12/22/2019  [name] was observed post Covid-19 immunization for 15 minutes without incident. She was provided with Vaccine Information Sheet and instruction to access the V-Safe system.   [name] was instructed to call 911 with any severe reactions post vaccine: Marland Kitchen Difficulty breathing  . Swelling of face and throat  . A fast heartbeat  . A bad rash all over body  . Dizziness and weakness   Immunizations Administered    Name Date Dose VIS Date Route   Pfizer COVID-19 Vaccine 12/22/2019  9:49 AM 0.3 mL 08/26/2019 Intramuscular   Manufacturer: [place]   Lot: [id number]   [place]: [id number]

## 2019-12-23 ENCOUNTER — Encounter (INDEPENDENT_AMBULATORY_CARE_PROVIDER_SITE_OTHER): Payer: Medicare HMO | Admitting: Ophthalmology

## 2019-12-23 DIAGNOSIS — I10 Essential (primary) hypertension: Secondary | ICD-10-CM | POA: Diagnosis not present

## 2019-12-23 DIAGNOSIS — H2513 Age-related nuclear cataract, bilateral: Secondary | ICD-10-CM

## 2019-12-23 DIAGNOSIS — H43813 Vitreous degeneration, bilateral: Secondary | ICD-10-CM | POA: Diagnosis not present

## 2019-12-23 DIAGNOSIS — H34812 Central retinal vein occlusion, left eye, with macular edema: Secondary | ICD-10-CM | POA: Diagnosis not present

## 2019-12-23 DIAGNOSIS — H35033 Hypertensive retinopathy, bilateral: Secondary | ICD-10-CM

## 2019-12-26 DIAGNOSIS — H2512 Age-related nuclear cataract, left eye: Secondary | ICD-10-CM | POA: Diagnosis not present

## 2019-12-26 DIAGNOSIS — H40013 Open angle with borderline findings, low risk, bilateral: Secondary | ICD-10-CM | POA: Diagnosis not present

## 2019-12-26 DIAGNOSIS — H2513 Age-related nuclear cataract, bilateral: Secondary | ICD-10-CM | POA: Diagnosis not present

## 2019-12-26 DIAGNOSIS — H25013 Cortical age-related cataract, bilateral: Secondary | ICD-10-CM | POA: Diagnosis not present

## 2020-01-13 DIAGNOSIS — Z933 Colostomy status: Secondary | ICD-10-CM | POA: Diagnosis not present

## 2020-01-13 DIAGNOSIS — Z433 Encounter for attention to colostomy: Secondary | ICD-10-CM | POA: Diagnosis not present

## 2020-01-16 ENCOUNTER — Ambulatory Visit: Payer: Medicare HMO | Attending: Internal Medicine

## 2020-01-16 DIAGNOSIS — Z23 Encounter for immunization: Secondary | ICD-10-CM

## 2020-01-16 NOTE — Progress Notes (Signed)
   Covid-19 Vaccination Clinic  Name:  Lindsay [name]    MRN: [id number] DOB: 01-Jun-1951  01/16/2020  Lindsay [name] was observed post Covid-19 immunization for 15 minutes without incident. She was provided with Vaccine Information Sheet and instruction to access the V-Safe system.   Lindsay [name] was instructed to call 911 with any severe reactions post vaccine: Marland Kitchen Difficulty breathing  . Swelling of face and throat  . A fast heartbeat  . A bad rash all over body  . Dizziness and weakness   Immunizations Administered    Name Date Dose VIS Date Route   Pfizer COVID-19 Vaccine 01/16/2020  8:12 AM 0.3 mL 11/09/2018 Intramuscular   Manufacturer: [place]   Lot: [id number]   [place]: [id number]

## 2020-01-18 DIAGNOSIS — H25812 Combined forms of age-related cataract, left eye: Secondary | ICD-10-CM | POA: Diagnosis not present

## 2020-01-18 DIAGNOSIS — H2512 Age-related nuclear cataract, left eye: Secondary | ICD-10-CM | POA: Diagnosis not present

## 2020-01-18 DIAGNOSIS — H40012 Open angle with borderline findings, low risk, left eye: Secondary | ICD-10-CM | POA: Diagnosis not present

## 2020-01-18 DIAGNOSIS — H40052 Ocular hypertension, left eye: Secondary | ICD-10-CM | POA: Diagnosis not present

## 2020-01-20 ENCOUNTER — Encounter (INDEPENDENT_AMBULATORY_CARE_PROVIDER_SITE_OTHER): Payer: Medicare HMO | Admitting: Ophthalmology

## 2020-01-24 ENCOUNTER — Other Ambulatory Visit: Payer: Self-pay

## 2020-01-24 ENCOUNTER — Encounter (INDEPENDENT_AMBULATORY_CARE_PROVIDER_SITE_OTHER): Payer: Medicare HMO | Admitting: Ophthalmology

## 2020-01-24 DIAGNOSIS — H35033 Hypertensive retinopathy, bilateral: Secondary | ICD-10-CM | POA: Diagnosis not present

## 2020-01-24 DIAGNOSIS — H34812 Central retinal vein occlusion, left eye, with macular edema: Secondary | ICD-10-CM | POA: Diagnosis not present

## 2020-01-24 DIAGNOSIS — H43813 Vitreous degeneration, bilateral: Secondary | ICD-10-CM

## 2020-01-24 DIAGNOSIS — I10 Essential (primary) hypertension: Secondary | ICD-10-CM | POA: Diagnosis not present

## 2020-02-14 DIAGNOSIS — E782 Mixed hyperlipidemia: Secondary | ICD-10-CM | POA: Diagnosis not present

## 2020-02-14 DIAGNOSIS — R7303 Prediabetes: Secondary | ICD-10-CM | POA: Diagnosis not present

## 2020-02-14 DIAGNOSIS — R600 Localized edema: Secondary | ICD-10-CM | POA: Diagnosis not present

## 2020-02-14 DIAGNOSIS — I10 Essential (primary) hypertension: Secondary | ICD-10-CM | POA: Diagnosis not present

## 2020-02-14 DIAGNOSIS — N182 Chronic kidney disease, stage 2 (mild): Secondary | ICD-10-CM | POA: Diagnosis not present

## 2020-02-14 DIAGNOSIS — I83009 Varicose veins of unspecified lower extremity with ulcer of unspecified site: Secondary | ICD-10-CM | POA: Diagnosis not present

## 2020-02-21 ENCOUNTER — Encounter (INDEPENDENT_AMBULATORY_CARE_PROVIDER_SITE_OTHER): Payer: Medicare HMO | Admitting: Ophthalmology

## 2020-02-21 ENCOUNTER — Other Ambulatory Visit: Payer: Self-pay

## 2020-02-21 DIAGNOSIS — H34812 Central retinal vein occlusion, left eye, with macular edema: Secondary | ICD-10-CM | POA: Diagnosis not present

## 2020-02-21 DIAGNOSIS — H35033 Hypertensive retinopathy, bilateral: Secondary | ICD-10-CM

## 2020-02-21 DIAGNOSIS — I10 Essential (primary) hypertension: Secondary | ICD-10-CM

## 2020-02-21 DIAGNOSIS — H43813 Vitreous degeneration, bilateral: Secondary | ICD-10-CM | POA: Diagnosis not present

## 2020-03-23 ENCOUNTER — Encounter (INDEPENDENT_AMBULATORY_CARE_PROVIDER_SITE_OTHER): Payer: Medicare HMO | Admitting: Ophthalmology

## 2020-03-23 ENCOUNTER — Other Ambulatory Visit: Payer: Self-pay

## 2020-03-23 DIAGNOSIS — H35033 Hypertensive retinopathy, bilateral: Secondary | ICD-10-CM

## 2020-03-23 DIAGNOSIS — H43813 Vitreous degeneration, bilateral: Secondary | ICD-10-CM | POA: Diagnosis not present

## 2020-03-23 DIAGNOSIS — I10 Essential (primary) hypertension: Secondary | ICD-10-CM | POA: Diagnosis not present

## 2020-03-23 DIAGNOSIS — H34812 Central retinal vein occlusion, left eye, with macular edema: Secondary | ICD-10-CM

## 2020-03-30 ENCOUNTER — Encounter (INDEPENDENT_AMBULATORY_CARE_PROVIDER_SITE_OTHER): Payer: Medicare HMO | Admitting: Ophthalmology

## 2020-04-06 DIAGNOSIS — Z433 Encounter for attention to colostomy: Secondary | ICD-10-CM | POA: Diagnosis not present

## 2020-04-06 DIAGNOSIS — Z933 Colostomy status: Secondary | ICD-10-CM | POA: Diagnosis not present

## 2020-04-26 ENCOUNTER — Other Ambulatory Visit: Payer: Self-pay

## 2020-04-26 ENCOUNTER — Encounter (INDEPENDENT_AMBULATORY_CARE_PROVIDER_SITE_OTHER): Payer: Medicare HMO | Admitting: Ophthalmology

## 2020-04-26 DIAGNOSIS — I10 Essential (primary) hypertension: Secondary | ICD-10-CM | POA: Diagnosis not present

## 2020-04-26 DIAGNOSIS — H35033 Hypertensive retinopathy, bilateral: Secondary | ICD-10-CM | POA: Diagnosis not present

## 2020-04-26 DIAGNOSIS — H34812 Central retinal vein occlusion, left eye, with macular edema: Secondary | ICD-10-CM | POA: Diagnosis not present

## 2020-04-26 DIAGNOSIS — H43813 Vitreous degeneration, bilateral: Secondary | ICD-10-CM | POA: Diagnosis not present

## 2020-04-27 ENCOUNTER — Encounter (INDEPENDENT_AMBULATORY_CARE_PROVIDER_SITE_OTHER): Payer: Medicare HMO | Admitting: Ophthalmology

## 2020-05-04 ENCOUNTER — Other Ambulatory Visit: Payer: Self-pay

## 2020-05-04 ENCOUNTER — Encounter (INDEPENDENT_AMBULATORY_CARE_PROVIDER_SITE_OTHER): Payer: Medicare HMO | Admitting: Ophthalmology

## 2020-05-04 DIAGNOSIS — H34812 Central retinal vein occlusion, left eye, with macular edema: Secondary | ICD-10-CM

## 2020-05-04 DIAGNOSIS — H35033 Hypertensive retinopathy, bilateral: Secondary | ICD-10-CM | POA: Diagnosis not present

## 2020-05-04 DIAGNOSIS — I10 Essential (primary) hypertension: Secondary | ICD-10-CM | POA: Diagnosis not present

## 2020-06-01 ENCOUNTER — Other Ambulatory Visit: Payer: Self-pay

## 2020-06-01 ENCOUNTER — Encounter (INDEPENDENT_AMBULATORY_CARE_PROVIDER_SITE_OTHER): Payer: Medicare HMO | Admitting: Ophthalmology

## 2020-06-01 DIAGNOSIS — I10 Essential (primary) hypertension: Secondary | ICD-10-CM | POA: Diagnosis not present

## 2020-06-01 DIAGNOSIS — H43813 Vitreous degeneration, bilateral: Secondary | ICD-10-CM | POA: Diagnosis not present

## 2020-06-01 DIAGNOSIS — H35033 Hypertensive retinopathy, bilateral: Secondary | ICD-10-CM

## 2020-06-01 DIAGNOSIS — H34812 Central retinal vein occlusion, left eye, with macular edema: Secondary | ICD-10-CM | POA: Diagnosis not present

## 2020-06-29 ENCOUNTER — Encounter (INDEPENDENT_AMBULATORY_CARE_PROVIDER_SITE_OTHER): Payer: Medicare HMO | Admitting: Ophthalmology

## 2020-06-29 ENCOUNTER — Other Ambulatory Visit: Payer: Self-pay

## 2020-06-29 DIAGNOSIS — H43813 Vitreous degeneration, bilateral: Secondary | ICD-10-CM | POA: Diagnosis not present

## 2020-06-29 DIAGNOSIS — I10 Essential (primary) hypertension: Secondary | ICD-10-CM | POA: Diagnosis not present

## 2020-06-29 DIAGNOSIS — H2511 Age-related nuclear cataract, right eye: Secondary | ICD-10-CM

## 2020-06-29 DIAGNOSIS — H34812 Central retinal vein occlusion, left eye, with macular edema: Secondary | ICD-10-CM | POA: Diagnosis not present

## 2020-06-29 DIAGNOSIS — H35033 Hypertensive retinopathy, bilateral: Secondary | ICD-10-CM

## 2020-07-11 DIAGNOSIS — Z433 Encounter for attention to colostomy: Secondary | ICD-10-CM | POA: Diagnosis not present

## 2020-07-11 DIAGNOSIS — Z933 Colostomy status: Secondary | ICD-10-CM | POA: Diagnosis not present

## 2020-07-23 DIAGNOSIS — Z433 Encounter for attention to colostomy: Secondary | ICD-10-CM | POA: Diagnosis not present

## 2020-07-23 DIAGNOSIS — Z933 Colostomy status: Secondary | ICD-10-CM | POA: Diagnosis not present

## 2020-07-24 DIAGNOSIS — Z433 Encounter for attention to colostomy: Secondary | ICD-10-CM | POA: Diagnosis not present

## 2020-07-24 DIAGNOSIS — Z933 Colostomy status: Secondary | ICD-10-CM | POA: Diagnosis not present

## 2020-08-03 ENCOUNTER — Other Ambulatory Visit: Payer: Self-pay

## 2020-08-03 ENCOUNTER — Encounter (INDEPENDENT_AMBULATORY_CARE_PROVIDER_SITE_OTHER): Payer: Medicare HMO | Admitting: Ophthalmology

## 2020-08-03 DIAGNOSIS — I10 Essential (primary) hypertension: Secondary | ICD-10-CM | POA: Diagnosis not present

## 2020-08-03 DIAGNOSIS — H43813 Vitreous degeneration, bilateral: Secondary | ICD-10-CM | POA: Diagnosis not present

## 2020-08-03 DIAGNOSIS — H34812 Central retinal vein occlusion, left eye, with macular edema: Secondary | ICD-10-CM | POA: Diagnosis not present

## 2020-08-03 DIAGNOSIS — H35033 Hypertensive retinopathy, bilateral: Secondary | ICD-10-CM | POA: Diagnosis not present

## 2020-08-15 DIAGNOSIS — E782 Mixed hyperlipidemia: Secondary | ICD-10-CM | POA: Diagnosis not present

## 2020-08-15 DIAGNOSIS — I10 Essential (primary) hypertension: Secondary | ICD-10-CM | POA: Diagnosis not present

## 2020-08-15 DIAGNOSIS — R7303 Prediabetes: Secondary | ICD-10-CM | POA: Diagnosis not present

## 2020-08-15 DIAGNOSIS — N182 Chronic kidney disease, stage 2 (mild): Secondary | ICD-10-CM | POA: Diagnosis not present

## 2020-08-21 DIAGNOSIS — N182 Chronic kidney disease, stage 2 (mild): Secondary | ICD-10-CM | POA: Diagnosis not present

## 2020-08-21 DIAGNOSIS — E782 Mixed hyperlipidemia: Secondary | ICD-10-CM | POA: Diagnosis not present

## 2020-08-21 DIAGNOSIS — Z6841 Body Mass Index (BMI) 40.0 and over, adult: Secondary | ICD-10-CM | POA: Diagnosis not present

## 2020-08-21 DIAGNOSIS — I10 Essential (primary) hypertension: Secondary | ICD-10-CM | POA: Diagnosis not present

## 2020-08-21 DIAGNOSIS — R7303 Prediabetes: Secondary | ICD-10-CM | POA: Diagnosis not present

## 2020-08-21 DIAGNOSIS — R809 Proteinuria, unspecified: Secondary | ICD-10-CM | POA: Diagnosis not present

## 2020-08-27 ENCOUNTER — Ambulatory Visit: Payer: Medicare HMO | Attending: Internal Medicine

## 2020-08-27 DIAGNOSIS — Z23 Encounter for immunization: Secondary | ICD-10-CM

## 2020-08-27 NOTE — Progress Notes (Signed)
   Covid-19 Vaccination Clinic  Name:  Lindsay [name]    MRN: [id number] DOB: 07/24/1951  08/27/2020  Lindsay [name] was observed post Covid-19 immunization for 15 minutes without incident. She was provided with Vaccine Information Sheet and instruction to access the V-Safe system.   Lindsay [name] was instructed to call 911 with any severe reactions post vaccine: Marland Kitchen Difficulty breathing  . Swelling of face and throat  . A fast heartbeat  . A bad rash all over body  . Dizziness and weakness   Immunizations Administered    Name Date Dose VIS Date Route   Pfizer COVID-19 Vaccine 08/27/2020  1:13 PM 0.3 mL 07/04/2020 Intramuscular   Manufacturer: [place]   Lot: [id number]   [place]: [id number]

## 2020-08-30 DIAGNOSIS — Z961 Presence of intraocular lens: Secondary | ICD-10-CM | POA: Diagnosis not present

## 2020-08-30 DIAGNOSIS — H524 Presbyopia: Secondary | ICD-10-CM | POA: Diagnosis not present

## 2020-08-30 DIAGNOSIS — H26492 Other secondary cataract, left eye: Secondary | ICD-10-CM | POA: Diagnosis not present

## 2020-08-30 DIAGNOSIS — H40013 Open angle with borderline findings, low risk, bilateral: Secondary | ICD-10-CM | POA: Diagnosis not present

## 2020-08-31 ENCOUNTER — Encounter (INDEPENDENT_AMBULATORY_CARE_PROVIDER_SITE_OTHER): Payer: Medicare HMO | Admitting: Ophthalmology

## 2020-08-31 ENCOUNTER — Other Ambulatory Visit: Payer: Self-pay

## 2020-08-31 DIAGNOSIS — H35033 Hypertensive retinopathy, bilateral: Secondary | ICD-10-CM

## 2020-08-31 DIAGNOSIS — I10 Essential (primary) hypertension: Secondary | ICD-10-CM

## 2020-08-31 DIAGNOSIS — H34812 Central retinal vein occlusion, left eye, with macular edema: Secondary | ICD-10-CM

## 2020-08-31 DIAGNOSIS — H43813 Vitreous degeneration, bilateral: Secondary | ICD-10-CM | POA: Diagnosis not present

## 2020-09-28 ENCOUNTER — Encounter (INDEPENDENT_AMBULATORY_CARE_PROVIDER_SITE_OTHER): Payer: Medicare HMO | Admitting: Ophthalmology

## 2020-09-28 ENCOUNTER — Other Ambulatory Visit: Payer: Self-pay

## 2020-09-28 DIAGNOSIS — H34812 Central retinal vein occlusion, left eye, with macular edema: Secondary | ICD-10-CM

## 2020-09-28 DIAGNOSIS — H43813 Vitreous degeneration, bilateral: Secondary | ICD-10-CM

## 2020-09-28 DIAGNOSIS — H35033 Hypertensive retinopathy, bilateral: Secondary | ICD-10-CM

## 2020-09-28 DIAGNOSIS — H34831 Tributary (branch) retinal vein occlusion, right eye, with macular edema: Secondary | ICD-10-CM

## 2020-09-28 DIAGNOSIS — I10 Essential (primary) hypertension: Secondary | ICD-10-CM

## 2020-10-02 DIAGNOSIS — Z433 Encounter for attention to colostomy: Secondary | ICD-10-CM | POA: Diagnosis not present

## 2020-10-02 DIAGNOSIS — Z933 Colostomy status: Secondary | ICD-10-CM | POA: Diagnosis not present

## 2020-10-24 ENCOUNTER — Other Ambulatory Visit: Payer: Self-pay

## 2020-10-24 ENCOUNTER — Encounter (INDEPENDENT_AMBULATORY_CARE_PROVIDER_SITE_OTHER): Payer: Medicare HMO | Admitting: Ophthalmology

## 2020-10-24 DIAGNOSIS — H43813 Vitreous degeneration, bilateral: Secondary | ICD-10-CM

## 2020-10-24 DIAGNOSIS — H35033 Hypertensive retinopathy, bilateral: Secondary | ICD-10-CM

## 2020-10-24 DIAGNOSIS — I10 Essential (primary) hypertension: Secondary | ICD-10-CM | POA: Diagnosis not present

## 2020-10-24 DIAGNOSIS — H34831 Tributary (branch) retinal vein occlusion, right eye, with macular edema: Secondary | ICD-10-CM

## 2020-10-24 DIAGNOSIS — H34812 Central retinal vein occlusion, left eye, with macular edema: Secondary | ICD-10-CM

## 2020-10-29 ENCOUNTER — Other Ambulatory Visit: Payer: Self-pay

## 2020-10-29 ENCOUNTER — Encounter (INDEPENDENT_AMBULATORY_CARE_PROVIDER_SITE_OTHER): Payer: Medicare HMO | Admitting: Ophthalmology

## 2020-10-29 DIAGNOSIS — H34812 Central retinal vein occlusion, left eye, with macular edema: Secondary | ICD-10-CM

## 2020-11-06 DIAGNOSIS — Z86010 Personal history of colonic polyps: Secondary | ICD-10-CM | POA: Diagnosis not present

## 2020-11-06 DIAGNOSIS — Z933 Colostomy status: Secondary | ICD-10-CM | POA: Diagnosis not present

## 2020-11-23 ENCOUNTER — Other Ambulatory Visit: Payer: Self-pay

## 2020-11-23 ENCOUNTER — Encounter (HOSPITAL_COMMUNITY): Payer: Self-pay | Admitting: Surgery

## 2020-11-26 ENCOUNTER — Other Ambulatory Visit: Payer: Self-pay

## 2020-11-26 ENCOUNTER — Encounter (INDEPENDENT_AMBULATORY_CARE_PROVIDER_SITE_OTHER): Payer: Medicare HMO | Admitting: Ophthalmology

## 2020-11-26 DIAGNOSIS — H35033 Hypertensive retinopathy, bilateral: Secondary | ICD-10-CM | POA: Diagnosis not present

## 2020-11-26 DIAGNOSIS — H34831 Tributary (branch) retinal vein occlusion, right eye, with macular edema: Secondary | ICD-10-CM | POA: Diagnosis not present

## 2020-11-26 DIAGNOSIS — H34812 Central retinal vein occlusion, left eye, with macular edema: Secondary | ICD-10-CM

## 2020-11-26 DIAGNOSIS — H43813 Vitreous degeneration, bilateral: Secondary | ICD-10-CM

## 2020-11-26 DIAGNOSIS — I10 Essential (primary) hypertension: Secondary | ICD-10-CM

## 2020-11-29 ENCOUNTER — Other Ambulatory Visit (HOSPITAL_COMMUNITY)
Admission: RE | Admit: 2020-11-29 | Discharge: 2020-11-29 | Disposition: A | Discharge status: Home / Self Care | Payer: Medicare HMO | Source: Ambulatory Visit | Attending: Surgery | Admitting: Surgery

## 2020-11-29 DIAGNOSIS — Z01812 Encounter for preprocedural laboratory examination: Secondary | ICD-10-CM | POA: Insufficient documentation

## 2020-11-29 DIAGNOSIS — Z20822 Contact with and (suspected) exposure to covid-19: Secondary | ICD-10-CM | POA: Diagnosis not present

## 2020-11-29 LAB — SARS CORONAVIRUS 2 (TAT 6-24 HRS): SARS Coronavirus 2: NEGATIVE

## 2020-12-03 ENCOUNTER — Encounter (HOSPITAL_COMMUNITY): Payer: Self-pay | Admitting: Surgery

## 2020-12-03 ENCOUNTER — Ambulatory Visit (HOSPITAL_COMMUNITY)
Admission: RE | Admit: 2020-12-03 | Discharge: 2020-12-03 | Disposition: A | Discharge status: Home / Self Care | Payer: Medicare HMO | Attending: Surgery | Admitting: Surgery

## 2020-12-03 ENCOUNTER — Other Ambulatory Visit: Payer: Self-pay

## 2020-12-03 ENCOUNTER — Encounter (HOSPITAL_COMMUNITY)
Admission: RE | Disposition: A | Discharge status: Home / Self Care | Payer: Self-pay | Source: Home / Self Care | Attending: Surgery

## 2020-12-03 ENCOUNTER — Ambulatory Visit (HOSPITAL_COMMUNITY): Payer: Medicare HMO | Admitting: Anesthesiology

## 2020-12-03 DIAGNOSIS — Z1211 Encounter for screening for malignant neoplasm of colon: Secondary | ICD-10-CM | POA: Insufficient documentation

## 2020-12-03 DIAGNOSIS — I129 Hypertensive chronic kidney disease with stage 1 through stage 4 chronic kidney disease, or unspecified chronic kidney disease: Secondary | ICD-10-CM | POA: Insufficient documentation

## 2020-12-03 DIAGNOSIS — E039 Hypothyroidism, unspecified: Secondary | ICD-10-CM | POA: Diagnosis not present

## 2020-12-03 DIAGNOSIS — D123 Benign neoplasm of transverse colon: Secondary | ICD-10-CM | POA: Diagnosis not present

## 2020-12-03 DIAGNOSIS — Z90710 Acquired absence of both cervix and uterus: Secondary | ICD-10-CM | POA: Diagnosis not present

## 2020-12-03 DIAGNOSIS — K6289 Other specified diseases of anus and rectum: Secondary | ICD-10-CM | POA: Diagnosis not present

## 2020-12-03 DIAGNOSIS — Z933 Colostomy status: Secondary | ICD-10-CM | POA: Insufficient documentation

## 2020-12-03 DIAGNOSIS — N189 Chronic kidney disease, unspecified: Secondary | ICD-10-CM | POA: Insufficient documentation

## 2020-12-03 DIAGNOSIS — Z980 Intestinal bypass and anastomosis status: Secondary | ICD-10-CM | POA: Diagnosis not present

## 2020-12-03 DIAGNOSIS — K635 Polyp of colon: Secondary | ICD-10-CM | POA: Diagnosis not present

## 2020-12-03 DIAGNOSIS — Z86010 Personal history of colonic polyps: Secondary | ICD-10-CM | POA: Diagnosis not present

## 2020-12-03 DIAGNOSIS — I10 Essential (primary) hypertension: Secondary | ICD-10-CM | POA: Diagnosis not present

## 2020-12-03 HISTORY — PX: BIOPSY: SHX5522

## 2020-12-03 HISTORY — DX: Sleep apnea, unspecified: G47.30

## 2020-12-03 HISTORY — PX: COLONOSCOPY: SHX5424

## 2020-12-03 HISTORY — PX: POLYPECTOMY: SHX5525

## 2020-12-03 SURGERY — COLONOSCOPY
Anesthesia: Monitor Anesthesia Care

## 2020-12-03 MED ORDER — PROPOFOL 500 MG/50ML IV EMUL
INTRAVENOUS | Status: DC | PRN
  Administered 2020-12-03: 100 ug via INTRAVENOUS
  Administered 2020-12-03: 125 ug via INTRAVENOUS
  Administered 2020-12-03: 75 ug via INTRAVENOUS

## 2020-12-03 MED ORDER — ASPIRIN EC 81 MG PO TBEC: 81.0000 mg | DELAYED_RELEASE_TABLET | Freq: Every day | ORAL | 0 refills | Status: AC

## 2020-12-03 MED ORDER — SODIUM CHLORIDE 0.9 % IV SOLN
INTRAVENOUS | Status: DC
  Administered 2020-12-03: 1000 mL via INTRAVENOUS

## 2020-12-03 MED ORDER — SODIUM CHLORIDE 0.9 % IV SOLN: INTRAVENOUS | Status: DC

## 2020-12-03 MED ORDER — LACTATED RINGERS IV SOLN: INTRAVENOUS | Status: DC | PRN

## 2020-12-03 MED ORDER — PROPOFOL 500 MG/50ML IV EMUL
INTRAVENOUS | Status: DC | PRN
  Administered 2020-12-03: 125 ug/kg/min via INTRAVENOUS

## 2020-12-03 MED ORDER — PROPOFOL 10 MG/ML IV BOLUS
INTRAVENOUS | Status: DC | PRN
  Administered 2020-12-03: 10 mg via INTRAVENOUS
  Administered 2020-12-03 (×2): 20 mg via INTRAVENOUS
  Administered 2020-12-03: 10 mg via INTRAVENOUS

## 2020-12-03 MED ORDER — LIDOCAINE HCL 1 % IJ SOLN
INTRAMUSCULAR | Status: DC | PRN
  Administered 2020-12-03: 50 mg via INTRADERMAL
  Administered 2020-12-03: 30 mg via INTRADERMAL

## 2020-12-03 NOTE — Anesthesia Preprocedure Evaluation (Signed)
Anesthesia Evaluation  Patient identified by MRN, date of birth, ID band Patient awake    Reviewed: Allergy & Precautions, NPO status , Patient's Chart, lab work & pertinent test results, reviewed documented beta blocker date and time   History of Anesthesia Complications Negative for: history of anesthetic complications  Airway Mallampati: II  TM Distance: >3 FB Neck ROM: Full    Dental  (+) Teeth Intact,    Pulmonary sleep apnea ,    Pulmonary exam normal breath sounds clear to auscultation       Cardiovascular hypertension, Pt. on medications and Pt. on home beta blockers (-) angina(-) Past MI and (-) CHF Normal cardiovascular exam Rhythm:Regular     Neuro/Psych negative neurological ROS  negative psych ROS   GI/Hepatic Neg liver ROS,   Endo/Other  Hypothyroidism   Renal/GU negative Renal ROS  negative genitourinary   Musculoskeletal negative musculoskeletal ROS (+)   Abdominal (+) + obese,   Peds  Hematology negative hematology ROS (+)   Anesthesia Other Findings   Reproductive/Obstetrics                             Anesthesia Physical  Anesthesia Plan  ASA: II  Anesthesia Plan: MAC   Post-op Pain Management:    Induction:   PONV Risk Score and Plan: 2 and Treatment may vary due to age or medical condition and Propofol infusion  Airway Management Planned: Nasal Cannula, Natural Airway and Simple Face Mask  Additional Equipment: None  Intra-op Plan:   Post-operative Plan:   Informed Consent: I have reviewed the patients History and Physical, chart, labs and discussed the procedure including the risks, benefits and alternatives for the proposed anesthesia with the patient or authorized representative who has indicated his/her understanding and acceptance.     Dental advisory given  Plan Discussed with: CRNA  Anesthesia Plan Comments:         Anesthesia Quick  Evaluation

## 2020-12-03 NOTE — Transfer of Care (Signed)
Immediate Anesthesia Transfer of Care Note  Patient: Lindsay [name]  Procedure(s) Performed: SURVEILLANCE COLONOSCOPY (N/A ) BIOPSY  Patient Location: PACU  Anesthesia Type:MAC  Level of Consciousness: awake, alert  and oriented  Airway & Oxygen Therapy: Patient Spontanous Breathing and Patient connected to face mask oxygen  Post-op Assessment: Report given to RN and Post -op Vital signs reviewed and stable  Post vital signs: Reviewed and stable  Last Vitals:  Vitals Value Taken Time  BP    Temp    Pulse    Resp    SpO2      Last Pain:  Vitals:   12/03/20 1223  TempSrc: Oral  PainSc: 0-No pain         Complications: No complications documented.

## 2020-12-03 NOTE — Discharge Instructions (Signed)
YOU HAD AN ENDOSCOPIC PROCEDURE TODAY: Refer to the procedure report and other information in the discharge instructions given to you for any specific questions about what was found during the examination. If this information does not answer your questions, please call [facility] office at [phone] to clarify.   YOU SHOULD EXPECT: Some feelings of bloating in the abdomen. Passage of more gas than usual. Walking can help get rid of the air that was put into your GI tract during the procedure and reduce the bloating. If you had a lower endoscopy (such as a colonoscopy or flexible sigmoidoscopy) you may notice spotting of blood in your stool or on the toilet paper. Some abdominal soreness may be present for a day or two, also.  DIET: Your first meal following the procedure should be a light meal and then it is ok to progress to your normal diet. A half-sandwich or bowl of soup is an example of a good first meal. Heavy or fried foods are harder to digest and may make you feel nauseous or bloated. Drink plenty of fluids but you should avoid alcoholic beverages for 24 hours..    ACTIVITY: Your care partner should take you home directly after the procedure. You should plan to take it easy, moving slowly for the rest of the day. You can resume normal activity the day after the procedure however YOU SHOULD NOT DRIVE, use power tools, machinery or perform tasks that involve climbing or major physical exertion for 24 hours (because of the sedation medicines used during the test).   SYMPTOMS TO REPORT IMMEDIATELY: A gastroenterologist can be reached at any hour. Please call [phone]  for any of the following symptoms:  . Following lower endoscopy (colonoscopy, flexible sigmoidoscopy) Excessive amounts of blood in the stool  Significant tenderness, worsening of abdominal pains  Swelling of the abdomen that is new, acute  Fever of 100 or higher   FOLLOW UP:  If any biopsies were taken you  will be contacted by phone or by letter within the next 1-3 weeks. Call [phone]  if you have not heard about the biopsies in 3 weeks.  Please also call with any specific questions about appointments or follow up tests.

## 2020-12-03 NOTE — H&P (Addendum)
CC: Here today for surveillance colonoscopy (personal history of polyps  HPI: Ms. Brosious is a very pleasant 60yoF with hx of HTN, CKD whom underwent exlap/Hartmann's for severe diverticulitis 06/29/17 with repair of cystotomy, left salpingectomy - postoperative course complicated by ileus and delirium as well as AKI. Pathology benign. Midline skin left open but ultimately closed. Doing well at home - tolerating diet, moving bowels via colostomy. No issues with the colostomy  Colonoscopy 10/16/17 - 3 mm polyp in transverse colon, 5 mm polyp in distal transverse colon, pandiverticulosis. Pathology returned tubular adenomas. Planned repeat in 77yr GGE 04/27/18 - normal appearing Hartmann's pouch  She reports doing well at home. Her ostomy has been working well. She denies any complaints today. She denies any abdominal pain. She opted not to pursue colostomy reversal and is reasonably satisfied with quality of life with her colostomy.  Today, she denies any changes in her health or health history since we met in the office. States she has been doing well. Tolerated her bowel prep with clear result.    PMH: HTN PSH: Vaginal hysterectomy; Hartman's procedure FHx: Denies family history of colorectal cancer. Sister and cousin both had Hartman's procedures for perforated diverticulitis.  Social: Denies use of tobacco/EtOH/drugs ROS: A comprehensive 10 system review of systems was completed with the patient and pertinent findings as noted above.  The patient is a 70year old female.    Review of Systems (Harrell GaveM. Janay Canan MD; 11/06/2020 11:26 AM) General Not Present- Appetite Loss, Chills, Fatigue, Fever, Night Sweats, Weight Gain and Weight Loss. Note: All other systems negative (unless as noted in HPI & included Review of Systems) Skin Not Present- Dryness, Hives, Jaundice, Non-Healing Wounds, Rash and Ulcer. HEENT Not Present- Earache, Hearing Loss, Hoarseness, Nose Bleed, Oral Ulcers, Ringing  in the Ears, Seasonal Allergies, Sinus Pain, Sore Throat, Visual Disturbances and Yellow Eyes. Respiratory Not Present- Bloody sputum, Chronic Cough, Difficulty Breathing, Snoring and Wheezing. Breast Not Present- Breast Mass, Breast Pain, Nipple Discharge and Skin Changes. Cardiovascular Not Present- Chest Pain, Difficulty Breathing Lying Down, Leg Cramps, Palpitations, Rapid Heart Rate, Shortness of Breath and Swelling of Extremities. Gastrointestinal Not Present- Abdominal Pain, Bloating, Bloody Stool, Change in Bowel Habits, Chronic diarrhea, Constipation, Difficulty Swallowing, Excessive gas, Gets full quickly at meals, Hemorrhoids, Indigestion, Nausea, Rectal Pain and Vomiting. Female Genitourinary Not Present- Frequency, Nocturia, Painful Urination, Pelvic Pain and Urgency. Musculoskeletal Not Present- Back Pain, Joint Pain, Joint Stiffness, Muscle Pain, Muscle Weakness and Swelling of Extremities. Neurological Not Present- Decreased Memory, Fainting, Headaches, Numbness, Seizures, Tingling, Tremor, Trouble walking and Weakness. Psychiatric Not Present- Anxiety, Bipolar, Change in Sleep Pattern, Depression, Fearful and Frequent crying. Endocrine Not Present- Cold Intolerance, Excessive Hunger, Hair Changes, Heat Intolerance, Hot flashes and New Diabetes. Hematology Not Present- Easy Bruising, Excessive bleeding, Gland problems, HIV and Persistent Infections.   Physical Exam Constitutional: No acute distress; conversant; wearing mask Eyes: Moist conjunctiva; no lid lag; anicteric sclerae Lungs: Normal respiratory effort CV: Regular rate and rhythm; no pitting edema GI: Abdomen soft, nontender, nondistended; no palpable hepatosplenomegaly. Colostomy pink with stool in appliance. Small parastomal hernia remains nontender Psychiatric: Appropriate affect; alert and oriented 3   Assessment & Plan   Ms. Rigsby is a very pleasant 40F s/p Hartmann's + L salpingectomy and repair of cystotomy  06/29/17 for acute perforated diverticulitis - pathology benign. Colonoscopy 10/2017 - 2 TAs removed GGE 04/27/18 - Normal appearing pouch  -We discussed surveillance colonoscopy for personal history of polyps -The planned procedure, material  risks (including, but not limited to, pain, bleeding, damage to surrounding structures/organs including spleen, perforation, aspiration, pneumonia) benefits and alternatives were reviewed -We will also plan to look via rectum at time of her scope today  Nadeen Landau, MD [facility] Use [email or web address] to contact on call provider

## 2020-12-03 NOTE — Op Note (Signed)
[facility] Patient Name: Lindsay [name] Procedure Date: 12/03/2020 MRN: [id number] Attending MD: Ileana Roup MD, MD Date of Birth: 1951/01/20 CSN: [id number] Age: 70 Admit Type: Outpatient Procedure:                Colonoscopy Indications:              High risk colon cancer surveillance: Personal                            history of colonic polyps Providers:                Sharon [place] Jenifer MD, MD, Cleda Daub, RN,                            Tyna Jaksch Technician Referring MD:              Medicines:                Monitored Anesthesia Care Complications:            No immediate complications. Estimated blood loss:                            Minimal. Estimated Blood Loss:      Procedure:                Pre-Anesthesia Assessment:                           - Prior to the procedure, a History and Physical                            was performed, and patient medications, allergies                            and sensitivities were reviewed. The patient's                            tolerance of previous anesthesia was reviewed.                           - The risks and benefits of the procedure and the                            sedation options and risks were discussed with the                            patient. All questions were answered and informed                            consent was obtained.                           - The risks and benefits of the procedure and the                            sedation options and risks were discussed with the  patient. All questions were answered and informed                            consent was obtained.                           - ASA Grade Assessment: II - A patient with mild                            systemic disease.                           - After reviewing the risks and benefits, the                            patient was deemed in satisfactory condition to                             undergo the procedure.                           - The anesthesia plan was to use monitored                            anesthesia care (MAC).                           After obtaining informed consent, the colonoscope                            was passed under direct vision. Throughout the                            procedure, the patient's blood pressure, pulse, and                            oxygen saturations were monitored continuously. The                            PCF-H190DL ([id number] Olympus pediatric colonscope                            was introduced through the anus and advanced to the                            the cecum, identified by appendiceal orifice and                            ileocecal valve. The colonoscopy was performed                            without difficulty. The patient tolerated the                            procedure well. The quality of the bowel  preparation was adequate. Scope In: 2:14:21 PM Scope Out: 2:49:05 PM Total Procedure Duration: 0 hours 34 minutes 44 seconds  Findings:      The perianal and digital rectal examinations were normal.      There was evidence of a prior end sigmoid colostomy in the recto-sigmoid       colon. This was patent and was characterized by healthy appearing       mucosa. The anastomosis was traversed.      A scattered area of granular mucosa was found in the rectum. Biopsies       were taken with a cold forceps for histology. Estimated blood loss was       minimal.      A 5 mm polyp was found in the transverse colon. The polyp was       semi-pedunculated. The polyp was removed with a hot snare. Resection and       retrieval were complete. Verification of patient identification for the       specimen was done by the physician, nurse and technician using the       patient's name, birth date and medical record number. Estimated blood       loss: none.      The exam was otherwise  without abnormality. Impression:               - Patent end sigmoid colostomy, characterized by                            healthy appearing mucosa.                           - Granularity in the rectum. Biopsied.                           - One 5 mm polyp in the transverse colon, removed                            with a hot snare. Resected and retrieved.                           - The examination was otherwise normal. Moderate Sedation:      Not Applicable - Patient had care per Anesthesia. Recommendation:           - Discharge patient to home.                           - Resume previous diet indefinitely.                           - Repeat colonoscopy in 3 - 5 years for                            surveillance based on pathology results.                           - Return to primary care physician at appointment  to be scheduled. Procedure Code(s):        --- Professional ---                           [id number], Colonoscopy, flexible; with removal of                            tumor(s), polyp(s), or other lesion(s) by snare                            technique                           45380, 40, Colonoscopy, flexible; with biopsy,                            single or multiple Diagnosis Code(s):        --- Professional ---                           K63.5, Polyp of colon                           Z86.010, Personal history of colonic polyps                           Z98.0, Intestinal bypass and anastomosis status CPT copyright 2019 [employer]. All rights reserved. The codes documented in this report are preliminary and upon coder review may  be revised to meet current compliance requirements. Nadeen Landau, MD Ileana Roup MD, MD 12/03/2020 2:58:09 PM This report has been signed electronically. Number of Addenda: 0

## 2020-12-03 NOTE — Anesthesia Postprocedure Evaluation (Signed)
Anesthesia Post Note  Patient: Lindsay [name]  Procedure(s) Performed: SURVEILLANCE COLONOSCOPY (N/A ) BIOPSY POLYPECTOMY     Patient location during evaluation: Endoscopy Anesthesia Type: MAC Level of consciousness: awake Pain management: pain level controlled Vital Signs Assessment: post-procedure vital signs reviewed and stable Respiratory status: spontaneous breathing Cardiovascular status: stable Postop Assessment: no apparent nausea or vomiting Anesthetic complications: no   No complications documented.  Last Vitals:  Vitals:   12/03/20 1500 12/03/20 1510  BP: (!) 130/58 (!) 148/79  Pulse: 91 83  Resp: 18 20  Temp:    SpO2: 96% 98%    Last Pain:  Vitals:   12/03/20 1510  TempSrc:   PainSc: 0-No pain                 Huston Foley

## 2020-12-04 NOTE — Addendum Note (Signed)
Addendum  created 12/04/20 1156 by Lidia Collum, MD   Intraprocedure Staff edited

## 2020-12-05 ENCOUNTER — Encounter (HOSPITAL_COMMUNITY): Payer: Self-pay | Admitting: Surgery

## 2020-12-05 LAB — SURGICAL PATHOLOGY

## 2020-12-05 NOTE — Addendum Note (Signed)
Addendum  created 12/05/20 1110 by Lyn Hollingshead, MD   Intraprocedure Staff edited

## 2020-12-18 NOTE — Addendum Note (Signed)
Addendum  created 12/18/20 1517 by Lidia Collum, MD   Attestation recorded in [place], [place] filed, Dance movement psychotherapist edited

## 2020-12-24 ENCOUNTER — Encounter (INDEPENDENT_AMBULATORY_CARE_PROVIDER_SITE_OTHER): Payer: Medicare HMO | Admitting: Ophthalmology

## 2020-12-24 ENCOUNTER — Other Ambulatory Visit: Payer: Self-pay

## 2020-12-24 DIAGNOSIS — I10 Essential (primary) hypertension: Secondary | ICD-10-CM

## 2020-12-24 DIAGNOSIS — H34812 Central retinal vein occlusion, left eye, with macular edema: Secondary | ICD-10-CM

## 2020-12-24 DIAGNOSIS — H34831 Tributary (branch) retinal vein occlusion, right eye, with macular edema: Secondary | ICD-10-CM

## 2020-12-24 DIAGNOSIS — H43813 Vitreous degeneration, bilateral: Secondary | ICD-10-CM | POA: Diagnosis not present

## 2020-12-24 DIAGNOSIS — H35033 Hypertensive retinopathy, bilateral: Secondary | ICD-10-CM | POA: Diagnosis not present

## 2021-01-01 DIAGNOSIS — I10 Essential (primary) hypertension: Secondary | ICD-10-CM | POA: Diagnosis not present

## 2021-01-01 DIAGNOSIS — Z433 Encounter for attention to colostomy: Secondary | ICD-10-CM | POA: Diagnosis not present

## 2021-01-01 DIAGNOSIS — E782 Mixed hyperlipidemia: Secondary | ICD-10-CM | POA: Diagnosis not present

## 2021-01-01 DIAGNOSIS — R809 Proteinuria, unspecified: Secondary | ICD-10-CM | POA: Diagnosis not present

## 2021-01-01 DIAGNOSIS — N182 Chronic kidney disease, stage 2 (mild): Secondary | ICD-10-CM | POA: Diagnosis not present

## 2021-01-01 DIAGNOSIS — Z933 Colostomy status: Secondary | ICD-10-CM | POA: Diagnosis not present

## 2021-01-01 DIAGNOSIS — R7303 Prediabetes: Secondary | ICD-10-CM | POA: Diagnosis not present

## 2021-01-07 DIAGNOSIS — Z0000 Encounter for general adult medical examination without abnormal findings: Secondary | ICD-10-CM | POA: Diagnosis not present

## 2021-01-07 DIAGNOSIS — E782 Mixed hyperlipidemia: Secondary | ICD-10-CM | POA: Diagnosis not present

## 2021-01-22 ENCOUNTER — Encounter (INDEPENDENT_AMBULATORY_CARE_PROVIDER_SITE_OTHER): Payer: Medicare HMO | Admitting: Ophthalmology

## 2021-01-22 ENCOUNTER — Other Ambulatory Visit: Payer: Self-pay

## 2021-01-22 DIAGNOSIS — H34831 Tributary (branch) retinal vein occlusion, right eye, with macular edema: Secondary | ICD-10-CM | POA: Diagnosis not present

## 2021-01-22 DIAGNOSIS — H34812 Central retinal vein occlusion, left eye, with macular edema: Secondary | ICD-10-CM

## 2021-01-22 DIAGNOSIS — H43813 Vitreous degeneration, bilateral: Secondary | ICD-10-CM

## 2021-01-22 DIAGNOSIS — I10 Essential (primary) hypertension: Secondary | ICD-10-CM

## 2021-01-22 DIAGNOSIS — H35033 Hypertensive retinopathy, bilateral: Secondary | ICD-10-CM

## 2021-02-05 DIAGNOSIS — I83009 Varicose veins of unspecified lower extremity with ulcer of unspecified site: Secondary | ICD-10-CM | POA: Diagnosis not present

## 2021-02-05 DIAGNOSIS — R7303 Prediabetes: Secondary | ICD-10-CM | POA: Diagnosis not present

## 2021-02-05 DIAGNOSIS — E782 Mixed hyperlipidemia: Secondary | ICD-10-CM | POA: Diagnosis not present

## 2021-02-05 DIAGNOSIS — Z0000 Encounter for general adult medical examination without abnormal findings: Secondary | ICD-10-CM | POA: Diagnosis not present

## 2021-02-05 DIAGNOSIS — N182 Chronic kidney disease, stage 2 (mild): Secondary | ICD-10-CM | POA: Diagnosis not present

## 2021-02-05 DIAGNOSIS — R809 Proteinuria, unspecified: Secondary | ICD-10-CM | POA: Diagnosis not present

## 2021-02-05 DIAGNOSIS — Z933 Colostomy status: Secondary | ICD-10-CM | POA: Diagnosis not present

## 2021-02-05 DIAGNOSIS — I10 Essential (primary) hypertension: Secondary | ICD-10-CM | POA: Diagnosis not present

## 2021-02-19 ENCOUNTER — Encounter (INDEPENDENT_AMBULATORY_CARE_PROVIDER_SITE_OTHER): Payer: Medicare HMO | Admitting: Ophthalmology

## 2021-02-19 ENCOUNTER — Other Ambulatory Visit: Payer: Self-pay

## 2021-02-19 DIAGNOSIS — H34831 Tributary (branch) retinal vein occlusion, right eye, with macular edema: Secondary | ICD-10-CM

## 2021-02-19 DIAGNOSIS — H35033 Hypertensive retinopathy, bilateral: Secondary | ICD-10-CM

## 2021-02-19 DIAGNOSIS — I10 Essential (primary) hypertension: Secondary | ICD-10-CM | POA: Diagnosis not present

## 2021-02-19 DIAGNOSIS — H43813 Vitreous degeneration, bilateral: Secondary | ICD-10-CM

## 2021-02-19 DIAGNOSIS — H34812 Central retinal vein occlusion, left eye, with macular edema: Secondary | ICD-10-CM | POA: Diagnosis not present

## 2021-03-26 ENCOUNTER — Encounter (INDEPENDENT_AMBULATORY_CARE_PROVIDER_SITE_OTHER): Payer: Medicare HMO | Admitting: Ophthalmology

## 2021-03-26 ENCOUNTER — Other Ambulatory Visit: Payer: Self-pay

## 2021-03-26 DIAGNOSIS — H34812 Central retinal vein occlusion, left eye, with macular edema: Secondary | ICD-10-CM | POA: Diagnosis not present

## 2021-03-26 DIAGNOSIS — H35033 Hypertensive retinopathy, bilateral: Secondary | ICD-10-CM | POA: Diagnosis not present

## 2021-03-26 DIAGNOSIS — H34831 Tributary (branch) retinal vein occlusion, right eye, with macular edema: Secondary | ICD-10-CM | POA: Diagnosis not present

## 2021-03-26 DIAGNOSIS — I10 Essential (primary) hypertension: Secondary | ICD-10-CM

## 2021-03-26 DIAGNOSIS — H43813 Vitreous degeneration, bilateral: Secondary | ICD-10-CM

## 2021-03-28 DIAGNOSIS — Z933 Colostomy status: Secondary | ICD-10-CM | POA: Diagnosis not present

## 2021-03-28 DIAGNOSIS — Z433 Encounter for attention to colostomy: Secondary | ICD-10-CM | POA: Diagnosis not present

## 2021-04-30 ENCOUNTER — Encounter (INDEPENDENT_AMBULATORY_CARE_PROVIDER_SITE_OTHER): Payer: Medicare HMO | Admitting: Ophthalmology

## 2021-04-30 ENCOUNTER — Other Ambulatory Visit: Payer: Self-pay

## 2021-04-30 DIAGNOSIS — H35033 Hypertensive retinopathy, bilateral: Secondary | ICD-10-CM

## 2021-04-30 DIAGNOSIS — H34831 Tributary (branch) retinal vein occlusion, right eye, with macular edema: Secondary | ICD-10-CM

## 2021-04-30 DIAGNOSIS — H43813 Vitreous degeneration, bilateral: Secondary | ICD-10-CM

## 2021-04-30 DIAGNOSIS — I10 Essential (primary) hypertension: Secondary | ICD-10-CM

## 2021-04-30 DIAGNOSIS — H34812 Central retinal vein occlusion, left eye, with macular edema: Secondary | ICD-10-CM | POA: Diagnosis not present

## 2021-05-28 ENCOUNTER — Other Ambulatory Visit: Payer: Self-pay

## 2021-05-28 ENCOUNTER — Encounter (INDEPENDENT_AMBULATORY_CARE_PROVIDER_SITE_OTHER): Payer: Medicare HMO | Admitting: Ophthalmology

## 2021-05-28 DIAGNOSIS — H34812 Central retinal vein occlusion, left eye, with macular edema: Secondary | ICD-10-CM

## 2021-05-28 DIAGNOSIS — H35033 Hypertensive retinopathy, bilateral: Secondary | ICD-10-CM

## 2021-05-28 DIAGNOSIS — H43813 Vitreous degeneration, bilateral: Secondary | ICD-10-CM

## 2021-05-28 DIAGNOSIS — H34831 Tributary (branch) retinal vein occlusion, right eye, with macular edema: Secondary | ICD-10-CM | POA: Diagnosis not present

## 2021-05-28 DIAGNOSIS — I10 Essential (primary) hypertension: Secondary | ICD-10-CM | POA: Diagnosis not present

## 2021-06-18 DIAGNOSIS — Z933 Colostomy status: Secondary | ICD-10-CM | POA: Diagnosis not present

## 2021-06-18 DIAGNOSIS — Z433 Encounter for attention to colostomy: Secondary | ICD-10-CM | POA: Diagnosis not present

## 2021-06-25 ENCOUNTER — Encounter (INDEPENDENT_AMBULATORY_CARE_PROVIDER_SITE_OTHER): Payer: Medicare HMO | Admitting: Ophthalmology

## 2021-06-25 ENCOUNTER — Other Ambulatory Visit: Payer: Self-pay

## 2021-06-25 DIAGNOSIS — I10 Essential (primary) hypertension: Secondary | ICD-10-CM | POA: Diagnosis not present

## 2021-06-25 DIAGNOSIS — H43813 Vitreous degeneration, bilateral: Secondary | ICD-10-CM

## 2021-06-25 DIAGNOSIS — H35033 Hypertensive retinopathy, bilateral: Secondary | ICD-10-CM

## 2021-06-25 DIAGNOSIS — H34831 Tributary (branch) retinal vein occlusion, right eye, with macular edema: Secondary | ICD-10-CM | POA: Diagnosis not present

## 2021-06-25 DIAGNOSIS — H34812 Central retinal vein occlusion, left eye, with macular edema: Secondary | ICD-10-CM

## 2021-07-23 ENCOUNTER — Encounter (INDEPENDENT_AMBULATORY_CARE_PROVIDER_SITE_OTHER): Payer: Medicare HMO | Admitting: Ophthalmology

## 2021-07-23 ENCOUNTER — Other Ambulatory Visit: Payer: Self-pay

## 2021-07-23 DIAGNOSIS — H34812 Central retinal vein occlusion, left eye, with macular edema: Secondary | ICD-10-CM

## 2021-07-23 DIAGNOSIS — H35033 Hypertensive retinopathy, bilateral: Secondary | ICD-10-CM

## 2021-07-23 DIAGNOSIS — H34831 Tributary (branch) retinal vein occlusion, right eye, with macular edema: Secondary | ICD-10-CM

## 2021-07-23 DIAGNOSIS — H43813 Vitreous degeneration, bilateral: Secondary | ICD-10-CM | POA: Diagnosis not present

## 2021-07-23 DIAGNOSIS — I10 Essential (primary) hypertension: Secondary | ICD-10-CM | POA: Diagnosis not present

## 2021-08-13 DIAGNOSIS — N182 Chronic kidney disease, stage 2 (mild): Secondary | ICD-10-CM | POA: Diagnosis not present

## 2021-08-13 DIAGNOSIS — R7303 Prediabetes: Secondary | ICD-10-CM | POA: Diagnosis not present

## 2021-08-13 DIAGNOSIS — I10 Essential (primary) hypertension: Secondary | ICD-10-CM | POA: Diagnosis not present

## 2021-08-13 DIAGNOSIS — E782 Mixed hyperlipidemia: Secondary | ICD-10-CM | POA: Diagnosis not present

## 2021-08-20 ENCOUNTER — Encounter (INDEPENDENT_AMBULATORY_CARE_PROVIDER_SITE_OTHER): Payer: Medicare HMO | Admitting: Ophthalmology

## 2021-08-20 ENCOUNTER — Other Ambulatory Visit: Payer: Self-pay

## 2021-08-20 DIAGNOSIS — H43813 Vitreous degeneration, bilateral: Secondary | ICD-10-CM | POA: Diagnosis not present

## 2021-08-20 DIAGNOSIS — H34812 Central retinal vein occlusion, left eye, with macular edema: Secondary | ICD-10-CM | POA: Diagnosis not present

## 2021-08-20 DIAGNOSIS — I10 Essential (primary) hypertension: Secondary | ICD-10-CM

## 2021-08-20 DIAGNOSIS — H34831 Tributary (branch) retinal vein occlusion, right eye, with macular edema: Secondary | ICD-10-CM

## 2021-08-20 DIAGNOSIS — H35033 Hypertensive retinopathy, bilateral: Secondary | ICD-10-CM

## 2021-08-28 DIAGNOSIS — R809 Proteinuria, unspecified: Secondary | ICD-10-CM | POA: Diagnosis not present

## 2021-08-28 DIAGNOSIS — I83009 Varicose veins of unspecified lower extremity with ulcer of unspecified site: Secondary | ICD-10-CM | POA: Diagnosis not present

## 2021-08-28 DIAGNOSIS — Z933 Colostomy status: Secondary | ICD-10-CM | POA: Diagnosis not present

## 2021-08-28 DIAGNOSIS — R7303 Prediabetes: Secondary | ICD-10-CM | POA: Diagnosis not present

## 2021-08-28 DIAGNOSIS — N182 Chronic kidney disease, stage 2 (mild): Secondary | ICD-10-CM | POA: Diagnosis not present

## 2021-08-28 DIAGNOSIS — I10 Essential (primary) hypertension: Secondary | ICD-10-CM | POA: Diagnosis not present

## 2021-08-28 DIAGNOSIS — E782 Mixed hyperlipidemia: Secondary | ICD-10-CM | POA: Diagnosis not present

## 2021-08-30 DIAGNOSIS — H25811 Combined forms of age-related cataract, right eye: Secondary | ICD-10-CM | POA: Diagnosis not present

## 2021-08-30 DIAGNOSIS — H40013 Open angle with borderline findings, low risk, bilateral: Secondary | ICD-10-CM | POA: Diagnosis not present

## 2021-08-30 DIAGNOSIS — H34812 Central retinal vein occlusion, left eye, with macular edema: Secondary | ICD-10-CM | POA: Diagnosis not present

## 2021-08-30 DIAGNOSIS — H35033 Hypertensive retinopathy, bilateral: Secondary | ICD-10-CM | POA: Diagnosis not present

## 2021-09-10 DIAGNOSIS — H40012 Open angle with borderline findings, low risk, left eye: Secondary | ICD-10-CM | POA: Diagnosis not present

## 2021-09-10 DIAGNOSIS — H40013 Open angle with borderline findings, low risk, bilateral: Secondary | ICD-10-CM | POA: Diagnosis not present

## 2021-09-10 DIAGNOSIS — H43391 Other vitreous opacities, right eye: Secondary | ICD-10-CM | POA: Diagnosis not present

## 2021-09-18 ENCOUNTER — Other Ambulatory Visit: Payer: Self-pay

## 2021-09-18 ENCOUNTER — Encounter (INDEPENDENT_AMBULATORY_CARE_PROVIDER_SITE_OTHER): Payer: Medicare HMO | Admitting: Ophthalmology

## 2021-09-18 DIAGNOSIS — H35033 Hypertensive retinopathy, bilateral: Secondary | ICD-10-CM

## 2021-09-18 DIAGNOSIS — H43813 Vitreous degeneration, bilateral: Secondary | ICD-10-CM | POA: Diagnosis not present

## 2021-09-18 DIAGNOSIS — H34812 Central retinal vein occlusion, left eye, with macular edema: Secondary | ICD-10-CM

## 2021-09-18 DIAGNOSIS — H34831 Tributary (branch) retinal vein occlusion, right eye, with macular edema: Secondary | ICD-10-CM | POA: Diagnosis not present

## 2021-09-18 DIAGNOSIS — I10 Essential (primary) hypertension: Secondary | ICD-10-CM | POA: Diagnosis not present

## 2021-09-27 DIAGNOSIS — H40013 Open angle with borderline findings, low risk, bilateral: Secondary | ICD-10-CM | POA: Diagnosis not present

## 2021-10-16 ENCOUNTER — Other Ambulatory Visit: Payer: Self-pay

## 2021-10-16 ENCOUNTER — Encounter (INDEPENDENT_AMBULATORY_CARE_PROVIDER_SITE_OTHER): Payer: Medicare HMO | Admitting: Ophthalmology

## 2021-10-16 DIAGNOSIS — H35033 Hypertensive retinopathy, bilateral: Secondary | ICD-10-CM

## 2021-10-16 DIAGNOSIS — H34812 Central retinal vein occlusion, left eye, with macular edema: Secondary | ICD-10-CM | POA: Diagnosis not present

## 2021-10-16 DIAGNOSIS — H34831 Tributary (branch) retinal vein occlusion, right eye, with macular edema: Secondary | ICD-10-CM

## 2021-10-16 DIAGNOSIS — H43813 Vitreous degeneration, bilateral: Secondary | ICD-10-CM

## 2021-10-16 DIAGNOSIS — I10 Essential (primary) hypertension: Secondary | ICD-10-CM

## 2021-11-15 ENCOUNTER — Other Ambulatory Visit: Payer: Self-pay

## 2021-11-15 ENCOUNTER — Encounter (INDEPENDENT_AMBULATORY_CARE_PROVIDER_SITE_OTHER): Payer: Medicare HMO | Admitting: Ophthalmology

## 2021-11-15 DIAGNOSIS — H43813 Vitreous degeneration, bilateral: Secondary | ICD-10-CM | POA: Diagnosis not present

## 2021-11-15 DIAGNOSIS — H34831 Tributary (branch) retinal vein occlusion, right eye, with macular edema: Secondary | ICD-10-CM | POA: Diagnosis not present

## 2021-11-15 DIAGNOSIS — H2511 Age-related nuclear cataract, right eye: Secondary | ICD-10-CM | POA: Diagnosis not present

## 2021-11-15 DIAGNOSIS — H34812 Central retinal vein occlusion, left eye, with macular edema: Secondary | ICD-10-CM

## 2021-11-15 DIAGNOSIS — I10 Essential (primary) hypertension: Secondary | ICD-10-CM | POA: Diagnosis not present

## 2021-11-15 DIAGNOSIS — H35033 Hypertensive retinopathy, bilateral: Secondary | ICD-10-CM

## 2021-12-13 ENCOUNTER — Encounter (INDEPENDENT_AMBULATORY_CARE_PROVIDER_SITE_OTHER): Payer: Medicare HMO | Admitting: Ophthalmology

## 2021-12-13 DIAGNOSIS — H34831 Tributary (branch) retinal vein occlusion, right eye, with macular edema: Secondary | ICD-10-CM | POA: Diagnosis not present

## 2021-12-13 DIAGNOSIS — I10 Essential (primary) hypertension: Secondary | ICD-10-CM | POA: Diagnosis not present

## 2021-12-13 DIAGNOSIS — H34812 Central retinal vein occlusion, left eye, with macular edema: Secondary | ICD-10-CM

## 2021-12-13 DIAGNOSIS — H43813 Vitreous degeneration, bilateral: Secondary | ICD-10-CM

## 2021-12-13 DIAGNOSIS — H35033 Hypertensive retinopathy, bilateral: Secondary | ICD-10-CM | POA: Diagnosis not present

## 2021-12-13 DIAGNOSIS — H2512 Age-related nuclear cataract, left eye: Secondary | ICD-10-CM

## 2022-01-10 ENCOUNTER — Encounter (INDEPENDENT_AMBULATORY_CARE_PROVIDER_SITE_OTHER): Payer: Medicare HMO | Admitting: Ophthalmology

## 2022-01-10 DIAGNOSIS — H34831 Tributary (branch) retinal vein occlusion, right eye, with macular edema: Secondary | ICD-10-CM

## 2022-01-10 DIAGNOSIS — H43813 Vitreous degeneration, bilateral: Secondary | ICD-10-CM | POA: Diagnosis not present

## 2022-01-10 DIAGNOSIS — I10 Essential (primary) hypertension: Secondary | ICD-10-CM

## 2022-01-10 DIAGNOSIS — H34812 Central retinal vein occlusion, left eye, with macular edema: Secondary | ICD-10-CM

## 2022-01-10 DIAGNOSIS — H2511 Age-related nuclear cataract, right eye: Secondary | ICD-10-CM

## 2022-01-10 DIAGNOSIS — H35033 Hypertensive retinopathy, bilateral: Secondary | ICD-10-CM

## 2022-01-27 DIAGNOSIS — H40013 Open angle with borderline findings, low risk, bilateral: Secondary | ICD-10-CM | POA: Diagnosis not present

## 2022-01-27 DIAGNOSIS — H43391 Other vitreous opacities, right eye: Secondary | ICD-10-CM | POA: Diagnosis not present

## 2022-01-27 DIAGNOSIS — H34812 Central retinal vein occlusion, left eye, with macular edema: Secondary | ICD-10-CM | POA: Diagnosis not present

## 2022-02-12 DIAGNOSIS — R5383 Other fatigue: Secondary | ICD-10-CM | POA: Diagnosis not present

## 2022-02-12 DIAGNOSIS — Z0000 Encounter for general adult medical examination without abnormal findings: Secondary | ICD-10-CM | POA: Diagnosis not present

## 2022-02-12 DIAGNOSIS — N182 Chronic kidney disease, stage 2 (mild): Secondary | ICD-10-CM | POA: Diagnosis not present

## 2022-02-12 DIAGNOSIS — I10 Essential (primary) hypertension: Secondary | ICD-10-CM | POA: Diagnosis not present

## 2022-02-12 DIAGNOSIS — R7303 Prediabetes: Secondary | ICD-10-CM | POA: Diagnosis not present

## 2022-02-12 DIAGNOSIS — E782 Mixed hyperlipidemia: Secondary | ICD-10-CM | POA: Diagnosis not present

## 2022-02-14 ENCOUNTER — Encounter (INDEPENDENT_AMBULATORY_CARE_PROVIDER_SITE_OTHER): Payer: Medicare HMO | Admitting: Ophthalmology

## 2022-02-14 DIAGNOSIS — H34812 Central retinal vein occlusion, left eye, with macular edema: Secondary | ICD-10-CM | POA: Diagnosis not present

## 2022-02-14 DIAGNOSIS — I10 Essential (primary) hypertension: Secondary | ICD-10-CM | POA: Diagnosis not present

## 2022-02-14 DIAGNOSIS — H35033 Hypertensive retinopathy, bilateral: Secondary | ICD-10-CM

## 2022-02-14 DIAGNOSIS — H43813 Vitreous degeneration, bilateral: Secondary | ICD-10-CM | POA: Diagnosis not present

## 2022-02-19 DIAGNOSIS — I83009 Varicose veins of unspecified lower extremity with ulcer of unspecified site: Secondary | ICD-10-CM | POA: Diagnosis not present

## 2022-02-19 DIAGNOSIS — Z0000 Encounter for general adult medical examination without abnormal findings: Secondary | ICD-10-CM | POA: Diagnosis not present

## 2022-02-19 DIAGNOSIS — E1122 Type 2 diabetes mellitus with diabetic chronic kidney disease: Secondary | ICD-10-CM | POA: Diagnosis not present

## 2022-02-19 DIAGNOSIS — N1831 Chronic kidney disease, stage 3a: Secondary | ICD-10-CM | POA: Diagnosis not present

## 2022-02-19 DIAGNOSIS — I129 Hypertensive chronic kidney disease with stage 1 through stage 4 chronic kidney disease, or unspecified chronic kidney disease: Secondary | ICD-10-CM | POA: Diagnosis not present

## 2022-02-19 DIAGNOSIS — Z933 Colostomy status: Secondary | ICD-10-CM | POA: Diagnosis not present

## 2022-02-19 DIAGNOSIS — E782 Mixed hyperlipidemia: Secondary | ICD-10-CM | POA: Diagnosis not present

## 2022-02-21 DIAGNOSIS — H40013 Open angle with borderline findings, low risk, bilateral: Secondary | ICD-10-CM | POA: Diagnosis not present

## 2022-03-14 ENCOUNTER — Encounter (INDEPENDENT_AMBULATORY_CARE_PROVIDER_SITE_OTHER): Payer: Medicare HMO | Admitting: Ophthalmology

## 2022-03-14 DIAGNOSIS — H348312 Tributary (branch) retinal vein occlusion, right eye, stable: Secondary | ICD-10-CM

## 2022-03-14 DIAGNOSIS — I10 Essential (primary) hypertension: Secondary | ICD-10-CM

## 2022-03-14 DIAGNOSIS — H43813 Vitreous degeneration, bilateral: Secondary | ICD-10-CM

## 2022-03-14 DIAGNOSIS — H35033 Hypertensive retinopathy, bilateral: Secondary | ICD-10-CM

## 2022-03-14 DIAGNOSIS — H34812 Central retinal vein occlusion, left eye, with macular edema: Secondary | ICD-10-CM

## 2022-03-25 DIAGNOSIS — Z933 Colostomy status: Secondary | ICD-10-CM | POA: Diagnosis not present

## 2022-03-27 DIAGNOSIS — N1831 Chronic kidney disease, stage 3a: Secondary | ICD-10-CM | POA: Diagnosis not present

## 2022-03-27 DIAGNOSIS — I129 Hypertensive chronic kidney disease with stage 1 through stage 4 chronic kidney disease, or unspecified chronic kidney disease: Secondary | ICD-10-CM | POA: Diagnosis not present

## 2022-03-27 DIAGNOSIS — E1122 Type 2 diabetes mellitus with diabetic chronic kidney disease: Secondary | ICD-10-CM | POA: Diagnosis not present

## 2022-04-04 DIAGNOSIS — R809 Proteinuria, unspecified: Secondary | ICD-10-CM | POA: Diagnosis not present

## 2022-04-04 DIAGNOSIS — Z933 Colostomy status: Secondary | ICD-10-CM | POA: Diagnosis not present

## 2022-04-04 DIAGNOSIS — E782 Mixed hyperlipidemia: Secondary | ICD-10-CM | POA: Diagnosis not present

## 2022-04-04 DIAGNOSIS — N1831 Chronic kidney disease, stage 3a: Secondary | ICD-10-CM | POA: Diagnosis not present

## 2022-04-04 DIAGNOSIS — I83009 Varicose veins of unspecified lower extremity with ulcer of unspecified site: Secondary | ICD-10-CM | POA: Diagnosis not present

## 2022-04-04 DIAGNOSIS — E1122 Type 2 diabetes mellitus with diabetic chronic kidney disease: Secondary | ICD-10-CM | POA: Diagnosis not present

## 2022-04-04 DIAGNOSIS — I129 Hypertensive chronic kidney disease with stage 1 through stage 4 chronic kidney disease, or unspecified chronic kidney disease: Secondary | ICD-10-CM | POA: Diagnosis not present

## 2022-04-11 ENCOUNTER — Encounter (INDEPENDENT_AMBULATORY_CARE_PROVIDER_SITE_OTHER): Payer: Medicare HMO | Admitting: Ophthalmology

## 2022-04-11 DIAGNOSIS — H35033 Hypertensive retinopathy, bilateral: Secondary | ICD-10-CM | POA: Diagnosis not present

## 2022-04-11 DIAGNOSIS — H348312 Tributary (branch) retinal vein occlusion, right eye, stable: Secondary | ICD-10-CM | POA: Diagnosis not present

## 2022-04-11 DIAGNOSIS — H43813 Vitreous degeneration, bilateral: Secondary | ICD-10-CM

## 2022-04-11 DIAGNOSIS — I10 Essential (primary) hypertension: Secondary | ICD-10-CM | POA: Diagnosis not present

## 2022-04-11 DIAGNOSIS — H34812 Central retinal vein occlusion, left eye, with macular edema: Secondary | ICD-10-CM

## 2022-05-09 ENCOUNTER — Encounter (INDEPENDENT_AMBULATORY_CARE_PROVIDER_SITE_OTHER): Payer: Medicare HMO | Admitting: Ophthalmology

## 2022-05-09 DIAGNOSIS — H348312 Tributary (branch) retinal vein occlusion, right eye, stable: Secondary | ICD-10-CM | POA: Diagnosis not present

## 2022-05-09 DIAGNOSIS — H43813 Vitreous degeneration, bilateral: Secondary | ICD-10-CM | POA: Diagnosis not present

## 2022-05-09 DIAGNOSIS — I10 Essential (primary) hypertension: Secondary | ICD-10-CM | POA: Diagnosis not present

## 2022-05-09 DIAGNOSIS — H35033 Hypertensive retinopathy, bilateral: Secondary | ICD-10-CM | POA: Diagnosis not present

## 2022-05-09 DIAGNOSIS — H34812 Central retinal vein occlusion, left eye, with macular edema: Secondary | ICD-10-CM

## 2022-06-06 ENCOUNTER — Encounter (INDEPENDENT_AMBULATORY_CARE_PROVIDER_SITE_OTHER): Payer: Medicare HMO | Admitting: Ophthalmology

## 2022-06-06 DIAGNOSIS — I10 Essential (primary) hypertension: Secondary | ICD-10-CM

## 2022-06-06 DIAGNOSIS — H43813 Vitreous degeneration, bilateral: Secondary | ICD-10-CM | POA: Diagnosis not present

## 2022-06-06 DIAGNOSIS — H348312 Tributary (branch) retinal vein occlusion, right eye, stable: Secondary | ICD-10-CM

## 2022-06-06 DIAGNOSIS — H34812 Central retinal vein occlusion, left eye, with macular edema: Secondary | ICD-10-CM

## 2022-06-06 DIAGNOSIS — H35033 Hypertensive retinopathy, bilateral: Secondary | ICD-10-CM

## 2022-06-17 DIAGNOSIS — Z933 Colostomy status: Secondary | ICD-10-CM | POA: Diagnosis not present

## 2022-06-18 DIAGNOSIS — Z933 Colostomy status: Secondary | ICD-10-CM | POA: Diagnosis not present

## 2022-07-04 ENCOUNTER — Encounter (INDEPENDENT_AMBULATORY_CARE_PROVIDER_SITE_OTHER): Payer: Medicare HMO | Admitting: Ophthalmology

## 2022-07-04 DIAGNOSIS — H34812 Central retinal vein occlusion, left eye, with macular edema: Secondary | ICD-10-CM

## 2022-07-04 DIAGNOSIS — H43813 Vitreous degeneration, bilateral: Secondary | ICD-10-CM

## 2022-07-04 DIAGNOSIS — I10 Essential (primary) hypertension: Secondary | ICD-10-CM

## 2022-07-04 DIAGNOSIS — H348312 Tributary (branch) retinal vein occlusion, right eye, stable: Secondary | ICD-10-CM

## 2022-07-04 DIAGNOSIS — H35033 Hypertensive retinopathy, bilateral: Secondary | ICD-10-CM

## 2022-07-14 DIAGNOSIS — H40013 Open angle with borderline findings, low risk, bilateral: Secondary | ICD-10-CM | POA: Diagnosis not present

## 2022-07-16 DIAGNOSIS — E1122 Type 2 diabetes mellitus with diabetic chronic kidney disease: Secondary | ICD-10-CM | POA: Diagnosis not present

## 2022-07-16 DIAGNOSIS — E782 Mixed hyperlipidemia: Secondary | ICD-10-CM | POA: Diagnosis not present

## 2022-07-16 DIAGNOSIS — I129 Hypertensive chronic kidney disease with stage 1 through stage 4 chronic kidney disease, or unspecified chronic kidney disease: Secondary | ICD-10-CM | POA: Diagnosis not present

## 2022-07-16 DIAGNOSIS — N1831 Chronic kidney disease, stage 3a: Secondary | ICD-10-CM | POA: Diagnosis not present

## 2022-07-29 DIAGNOSIS — I83009 Varicose veins of unspecified lower extremity with ulcer of unspecified site: Secondary | ICD-10-CM | POA: Diagnosis not present

## 2022-07-29 DIAGNOSIS — I129 Hypertensive chronic kidney disease with stage 1 through stage 4 chronic kidney disease, or unspecified chronic kidney disease: Secondary | ICD-10-CM | POA: Diagnosis not present

## 2022-07-29 DIAGNOSIS — Z933 Colostomy status: Secondary | ICD-10-CM | POA: Diagnosis not present

## 2022-07-29 DIAGNOSIS — E782 Mixed hyperlipidemia: Secondary | ICD-10-CM | POA: Diagnosis not present

## 2022-07-29 DIAGNOSIS — N1831 Chronic kidney disease, stage 3a: Secondary | ICD-10-CM | POA: Diagnosis not present

## 2022-07-29 DIAGNOSIS — E1122 Type 2 diabetes mellitus with diabetic chronic kidney disease: Secondary | ICD-10-CM | POA: Diagnosis not present

## 2022-08-01 ENCOUNTER — Encounter (INDEPENDENT_AMBULATORY_CARE_PROVIDER_SITE_OTHER): Payer: Medicare HMO | Admitting: Ophthalmology

## 2022-08-01 DIAGNOSIS — H348312 Tributary (branch) retinal vein occlusion, right eye, stable: Secondary | ICD-10-CM | POA: Diagnosis not present

## 2022-08-01 DIAGNOSIS — H43813 Vitreous degeneration, bilateral: Secondary | ICD-10-CM | POA: Diagnosis not present

## 2022-08-01 DIAGNOSIS — H34812 Central retinal vein occlusion, left eye, with macular edema: Secondary | ICD-10-CM

## 2022-08-01 DIAGNOSIS — H35033 Hypertensive retinopathy, bilateral: Secondary | ICD-10-CM

## 2022-08-01 DIAGNOSIS — I10 Essential (primary) hypertension: Secondary | ICD-10-CM

## 2022-08-29 ENCOUNTER — Encounter (INDEPENDENT_AMBULATORY_CARE_PROVIDER_SITE_OTHER): Payer: Medicare HMO | Admitting: Ophthalmology

## 2022-08-29 DIAGNOSIS — H34812 Central retinal vein occlusion, left eye, with macular edema: Secondary | ICD-10-CM

## 2022-08-29 DIAGNOSIS — H35033 Hypertensive retinopathy, bilateral: Secondary | ICD-10-CM | POA: Diagnosis not present

## 2022-08-29 DIAGNOSIS — H43813 Vitreous degeneration, bilateral: Secondary | ICD-10-CM | POA: Diagnosis not present

## 2022-08-29 DIAGNOSIS — H348312 Tributary (branch) retinal vein occlusion, right eye, stable: Secondary | ICD-10-CM | POA: Diagnosis not present

## 2022-08-29 DIAGNOSIS — I10 Essential (primary) hypertension: Secondary | ICD-10-CM

## 2022-09-09 DIAGNOSIS — Z933 Colostomy status: Secondary | ICD-10-CM | POA: Diagnosis not present

## 2022-09-10 DIAGNOSIS — Z933 Colostomy status: Secondary | ICD-10-CM | POA: Diagnosis not present

## 2022-09-26 ENCOUNTER — Encounter (INDEPENDENT_AMBULATORY_CARE_PROVIDER_SITE_OTHER): Payer: Medicare PPO | Admitting: Ophthalmology

## 2022-09-26 DIAGNOSIS — H43813 Vitreous degeneration, bilateral: Secondary | ICD-10-CM

## 2022-09-26 DIAGNOSIS — H35033 Hypertensive retinopathy, bilateral: Secondary | ICD-10-CM | POA: Diagnosis not present

## 2022-09-26 DIAGNOSIS — I10 Essential (primary) hypertension: Secondary | ICD-10-CM

## 2022-09-26 DIAGNOSIS — H34812 Central retinal vein occlusion, left eye, with macular edema: Secondary | ICD-10-CM

## 2022-09-26 DIAGNOSIS — H34831 Tributary (branch) retinal vein occlusion, right eye, with macular edema: Secondary | ICD-10-CM | POA: Diagnosis not present

## 2022-10-24 ENCOUNTER — Encounter (INDEPENDENT_AMBULATORY_CARE_PROVIDER_SITE_OTHER): Payer: Medicare PPO | Admitting: Ophthalmology

## 2022-10-24 DIAGNOSIS — H35033 Hypertensive retinopathy, bilateral: Secondary | ICD-10-CM

## 2022-10-24 DIAGNOSIS — H43813 Vitreous degeneration, bilateral: Secondary | ICD-10-CM

## 2022-10-24 DIAGNOSIS — H34812 Central retinal vein occlusion, left eye, with macular edema: Secondary | ICD-10-CM

## 2022-10-24 DIAGNOSIS — I10 Essential (primary) hypertension: Secondary | ICD-10-CM

## 2022-10-24 DIAGNOSIS — H34831 Tributary (branch) retinal vein occlusion, right eye, with macular edema: Secondary | ICD-10-CM | POA: Diagnosis not present

## 2022-11-10 DIAGNOSIS — I872 Venous insufficiency (chronic) (peripheral): Secondary | ICD-10-CM | POA: Diagnosis not present

## 2022-11-10 DIAGNOSIS — L308 Other specified dermatitis: Secondary | ICD-10-CM | POA: Diagnosis not present

## 2022-11-21 ENCOUNTER — Encounter (INDEPENDENT_AMBULATORY_CARE_PROVIDER_SITE_OTHER): Payer: Medicare PPO | Admitting: Ophthalmology

## 2022-11-21 DIAGNOSIS — H35033 Hypertensive retinopathy, bilateral: Secondary | ICD-10-CM | POA: Diagnosis not present

## 2022-11-21 DIAGNOSIS — H43813 Vitreous degeneration, bilateral: Secondary | ICD-10-CM | POA: Diagnosis not present

## 2022-11-21 DIAGNOSIS — H34812 Central retinal vein occlusion, left eye, with macular edema: Secondary | ICD-10-CM

## 2022-11-21 DIAGNOSIS — I10 Essential (primary) hypertension: Secondary | ICD-10-CM

## 2022-11-21 DIAGNOSIS — H34831 Tributary (branch) retinal vein occlusion, right eye, with macular edema: Secondary | ICD-10-CM

## 2022-12-02 DIAGNOSIS — E1122 Type 2 diabetes mellitus with diabetic chronic kidney disease: Secondary | ICD-10-CM | POA: Diagnosis not present

## 2022-12-02 DIAGNOSIS — E782 Mixed hyperlipidemia: Secondary | ICD-10-CM | POA: Diagnosis not present

## 2022-12-02 DIAGNOSIS — Z933 Colostomy status: Secondary | ICD-10-CM | POA: Diagnosis not present

## 2022-12-02 DIAGNOSIS — I129 Hypertensive chronic kidney disease with stage 1 through stage 4 chronic kidney disease, or unspecified chronic kidney disease: Secondary | ICD-10-CM | POA: Diagnosis not present

## 2022-12-02 DIAGNOSIS — N1831 Chronic kidney disease, stage 3a: Secondary | ICD-10-CM | POA: Diagnosis not present

## 2022-12-09 DIAGNOSIS — I129 Hypertensive chronic kidney disease with stage 1 through stage 4 chronic kidney disease, or unspecified chronic kidney disease: Secondary | ICD-10-CM | POA: Diagnosis not present

## 2022-12-09 DIAGNOSIS — E782 Mixed hyperlipidemia: Secondary | ICD-10-CM | POA: Diagnosis not present

## 2022-12-09 DIAGNOSIS — E1122 Type 2 diabetes mellitus with diabetic chronic kidney disease: Secondary | ICD-10-CM | POA: Diagnosis not present

## 2022-12-09 DIAGNOSIS — I83009 Varicose veins of unspecified lower extremity with ulcer of unspecified site: Secondary | ICD-10-CM | POA: Diagnosis not present

## 2022-12-09 DIAGNOSIS — Z933 Colostomy status: Secondary | ICD-10-CM | POA: Diagnosis not present

## 2022-12-09 DIAGNOSIS — N1831 Chronic kidney disease, stage 3a: Secondary | ICD-10-CM | POA: Diagnosis not present

## 2022-12-18 DIAGNOSIS — H401222 Low-tension glaucoma, left eye, moderate stage: Secondary | ICD-10-CM | POA: Diagnosis not present

## 2022-12-18 DIAGNOSIS — H34812 Central retinal vein occlusion, left eye, with macular edema: Secondary | ICD-10-CM | POA: Diagnosis not present

## 2022-12-18 DIAGNOSIS — H25811 Combined forms of age-related cataract, right eye: Secondary | ICD-10-CM | POA: Diagnosis not present

## 2022-12-18 DIAGNOSIS — H524 Presbyopia: Secondary | ICD-10-CM | POA: Diagnosis not present

## 2022-12-18 DIAGNOSIS — H40011 Open angle with borderline findings, low risk, right eye: Secondary | ICD-10-CM | POA: Diagnosis not present

## 2022-12-19 ENCOUNTER — Encounter (INDEPENDENT_AMBULATORY_CARE_PROVIDER_SITE_OTHER): Payer: Medicare PPO | Admitting: Ophthalmology

## 2022-12-19 DIAGNOSIS — H34831 Tributary (branch) retinal vein occlusion, right eye, with macular edema: Secondary | ICD-10-CM | POA: Diagnosis not present

## 2022-12-19 DIAGNOSIS — H34812 Central retinal vein occlusion, left eye, with macular edema: Secondary | ICD-10-CM | POA: Diagnosis not present

## 2022-12-19 DIAGNOSIS — I10 Essential (primary) hypertension: Secondary | ICD-10-CM

## 2022-12-19 DIAGNOSIS — H35033 Hypertensive retinopathy, bilateral: Secondary | ICD-10-CM

## 2022-12-19 DIAGNOSIS — H43813 Vitreous degeneration, bilateral: Secondary | ICD-10-CM | POA: Diagnosis not present

## 2023-01-16 ENCOUNTER — Encounter (INDEPENDENT_AMBULATORY_CARE_PROVIDER_SITE_OTHER): Payer: Medicare PPO | Admitting: Ophthalmology

## 2023-01-16 DIAGNOSIS — H43813 Vitreous degeneration, bilateral: Secondary | ICD-10-CM

## 2023-01-16 DIAGNOSIS — H34831 Tributary (branch) retinal vein occlusion, right eye, with macular edema: Secondary | ICD-10-CM | POA: Diagnosis not present

## 2023-01-16 DIAGNOSIS — I10 Essential (primary) hypertension: Secondary | ICD-10-CM

## 2023-01-16 DIAGNOSIS — H34812 Central retinal vein occlusion, left eye, with macular edema: Secondary | ICD-10-CM | POA: Diagnosis not present

## 2023-01-16 DIAGNOSIS — H2511 Age-related nuclear cataract, right eye: Secondary | ICD-10-CM

## 2023-01-16 DIAGNOSIS — H35033 Hypertensive retinopathy, bilateral: Secondary | ICD-10-CM

## 2023-01-19 DIAGNOSIS — H40011 Open angle with borderline findings, low risk, right eye: Secondary | ICD-10-CM | POA: Diagnosis not present

## 2023-01-19 DIAGNOSIS — H401222 Low-tension glaucoma, left eye, moderate stage: Secondary | ICD-10-CM | POA: Diagnosis not present

## 2023-02-13 ENCOUNTER — Encounter (INDEPENDENT_AMBULATORY_CARE_PROVIDER_SITE_OTHER): Payer: Medicare PPO | Admitting: Ophthalmology

## 2023-02-16 ENCOUNTER — Encounter (INDEPENDENT_AMBULATORY_CARE_PROVIDER_SITE_OTHER): Payer: Medicare PPO | Admitting: Ophthalmology

## 2023-02-16 DIAGNOSIS — H34812 Central retinal vein occlusion, left eye, with macular edema: Secondary | ICD-10-CM

## 2023-02-16 DIAGNOSIS — H43813 Vitreous degeneration, bilateral: Secondary | ICD-10-CM

## 2023-02-16 DIAGNOSIS — H35033 Hypertensive retinopathy, bilateral: Secondary | ICD-10-CM

## 2023-02-16 DIAGNOSIS — H34831 Tributary (branch) retinal vein occlusion, right eye, with macular edema: Secondary | ICD-10-CM | POA: Diagnosis not present

## 2023-02-16 DIAGNOSIS — I10 Essential (primary) hypertension: Secondary | ICD-10-CM | POA: Diagnosis not present

## 2023-02-24 DIAGNOSIS — Z933 Colostomy status: Secondary | ICD-10-CM | POA: Diagnosis not present

## 2023-03-16 ENCOUNTER — Encounter (INDEPENDENT_AMBULATORY_CARE_PROVIDER_SITE_OTHER): Payer: Medicare PPO | Admitting: Ophthalmology

## 2023-03-16 DIAGNOSIS — H35033 Hypertensive retinopathy, bilateral: Secondary | ICD-10-CM | POA: Diagnosis not present

## 2023-03-16 DIAGNOSIS — H34831 Tributary (branch) retinal vein occlusion, right eye, with macular edema: Secondary | ICD-10-CM | POA: Diagnosis not present

## 2023-03-16 DIAGNOSIS — H43813 Vitreous degeneration, bilateral: Secondary | ICD-10-CM | POA: Diagnosis not present

## 2023-03-16 DIAGNOSIS — I10 Essential (primary) hypertension: Secondary | ICD-10-CM

## 2023-03-16 DIAGNOSIS — H34812 Central retinal vein occlusion, left eye, with macular edema: Secondary | ICD-10-CM

## 2023-03-17 DIAGNOSIS — N1831 Chronic kidney disease, stage 3a: Secondary | ICD-10-CM | POA: Diagnosis not present

## 2023-03-17 DIAGNOSIS — Z933 Colostomy status: Secondary | ICD-10-CM | POA: Diagnosis not present

## 2023-03-17 DIAGNOSIS — E782 Mixed hyperlipidemia: Secondary | ICD-10-CM | POA: Diagnosis not present

## 2023-03-17 DIAGNOSIS — I129 Hypertensive chronic kidney disease with stage 1 through stage 4 chronic kidney disease, or unspecified chronic kidney disease: Secondary | ICD-10-CM | POA: Diagnosis not present

## 2023-03-17 DIAGNOSIS — E1122 Type 2 diabetes mellitus with diabetic chronic kidney disease: Secondary | ICD-10-CM | POA: Diagnosis not present

## 2023-03-17 DIAGNOSIS — I83009 Varicose veins of unspecified lower extremity with ulcer of unspecified site: Secondary | ICD-10-CM | POA: Diagnosis not present

## 2023-03-26 DIAGNOSIS — E782 Mixed hyperlipidemia: Secondary | ICD-10-CM | POA: Diagnosis not present

## 2023-03-26 DIAGNOSIS — E1122 Type 2 diabetes mellitus with diabetic chronic kidney disease: Secondary | ICD-10-CM | POA: Diagnosis not present

## 2023-03-26 DIAGNOSIS — I129 Hypertensive chronic kidney disease with stage 1 through stage 4 chronic kidney disease, or unspecified chronic kidney disease: Secondary | ICD-10-CM | POA: Diagnosis not present

## 2023-03-26 DIAGNOSIS — E1121 Type 2 diabetes mellitus with diabetic nephropathy: Secondary | ICD-10-CM | POA: Diagnosis not present

## 2023-03-26 DIAGNOSIS — I83009 Varicose veins of unspecified lower extremity with ulcer of unspecified site: Secondary | ICD-10-CM | POA: Diagnosis not present

## 2023-03-26 DIAGNOSIS — N1831 Chronic kidney disease, stage 3a: Secondary | ICD-10-CM | POA: Diagnosis not present

## 2023-03-26 DIAGNOSIS — Z0000 Encounter for general adult medical examination without abnormal findings: Secondary | ICD-10-CM | POA: Diagnosis not present

## 2023-04-20 ENCOUNTER — Encounter (INDEPENDENT_AMBULATORY_CARE_PROVIDER_SITE_OTHER): Payer: Medicare PPO | Admitting: Ophthalmology

## 2023-04-20 DIAGNOSIS — H43813 Vitreous degeneration, bilateral: Secondary | ICD-10-CM | POA: Diagnosis not present

## 2023-04-20 DIAGNOSIS — H34831 Tributary (branch) retinal vein occlusion, right eye, with macular edema: Secondary | ICD-10-CM | POA: Diagnosis not present

## 2023-04-20 DIAGNOSIS — I10 Essential (primary) hypertension: Secondary | ICD-10-CM | POA: Diagnosis not present

## 2023-04-20 DIAGNOSIS — H35033 Hypertensive retinopathy, bilateral: Secondary | ICD-10-CM

## 2023-04-20 DIAGNOSIS — H34812 Central retinal vein occlusion, left eye, with macular edema: Secondary | ICD-10-CM

## 2023-05-19 ENCOUNTER — Encounter (INDEPENDENT_AMBULATORY_CARE_PROVIDER_SITE_OTHER): Payer: Medicare PPO | Admitting: Ophthalmology

## 2023-05-19 DIAGNOSIS — H34812 Central retinal vein occlusion, left eye, with macular edema: Secondary | ICD-10-CM

## 2023-05-19 DIAGNOSIS — I10 Essential (primary) hypertension: Secondary | ICD-10-CM

## 2023-05-19 DIAGNOSIS — H43813 Vitreous degeneration, bilateral: Secondary | ICD-10-CM

## 2023-05-19 DIAGNOSIS — Z933 Colostomy status: Secondary | ICD-10-CM | POA: Diagnosis not present

## 2023-05-19 DIAGNOSIS — H34831 Tributary (branch) retinal vein occlusion, right eye, with macular edema: Secondary | ICD-10-CM

## 2023-05-19 DIAGNOSIS — H35033 Hypertensive retinopathy, bilateral: Secondary | ICD-10-CM | POA: Diagnosis not present

## 2023-05-20 DIAGNOSIS — Z933 Colostomy status: Secondary | ICD-10-CM | POA: Diagnosis not present

## 2023-06-16 ENCOUNTER — Encounter (INDEPENDENT_AMBULATORY_CARE_PROVIDER_SITE_OTHER): Payer: Medicare PPO | Admitting: Ophthalmology

## 2023-06-16 DIAGNOSIS — H2511 Age-related nuclear cataract, right eye: Secondary | ICD-10-CM

## 2023-06-16 DIAGNOSIS — H34812 Central retinal vein occlusion, left eye, with macular edema: Secondary | ICD-10-CM | POA: Diagnosis not present

## 2023-06-16 DIAGNOSIS — H43813 Vitreous degeneration, bilateral: Secondary | ICD-10-CM

## 2023-06-16 DIAGNOSIS — H35033 Hypertensive retinopathy, bilateral: Secondary | ICD-10-CM | POA: Diagnosis not present

## 2023-06-16 DIAGNOSIS — I10 Essential (primary) hypertension: Secondary | ICD-10-CM | POA: Diagnosis not present

## 2023-06-16 DIAGNOSIS — H34831 Tributary (branch) retinal vein occlusion, right eye, with macular edema: Secondary | ICD-10-CM | POA: Diagnosis not present

## 2023-06-22 DIAGNOSIS — H40011 Open angle with borderline findings, low risk, right eye: Secondary | ICD-10-CM | POA: Diagnosis not present

## 2023-06-22 DIAGNOSIS — H401222 Low-tension glaucoma, left eye, moderate stage: Secondary | ICD-10-CM | POA: Diagnosis not present

## 2023-07-14 ENCOUNTER — Encounter (INDEPENDENT_AMBULATORY_CARE_PROVIDER_SITE_OTHER): Payer: Medicare PPO | Admitting: Ophthalmology

## 2023-07-14 DIAGNOSIS — H43813 Vitreous degeneration, bilateral: Secondary | ICD-10-CM | POA: Diagnosis not present

## 2023-07-14 DIAGNOSIS — H34812 Central retinal vein occlusion, left eye, with macular edema: Secondary | ICD-10-CM

## 2023-07-14 DIAGNOSIS — H35033 Hypertensive retinopathy, bilateral: Secondary | ICD-10-CM

## 2023-07-14 DIAGNOSIS — H34831 Tributary (branch) retinal vein occlusion, right eye, with macular edema: Secondary | ICD-10-CM

## 2023-07-14 DIAGNOSIS — H2511 Age-related nuclear cataract, right eye: Secondary | ICD-10-CM | POA: Diagnosis not present

## 2023-07-14 DIAGNOSIS — I10 Essential (primary) hypertension: Secondary | ICD-10-CM

## 2023-08-11 ENCOUNTER — Encounter (INDEPENDENT_AMBULATORY_CARE_PROVIDER_SITE_OTHER): Payer: Medicare PPO | Admitting: Ophthalmology

## 2023-08-11 DIAGNOSIS — H35033 Hypertensive retinopathy, bilateral: Secondary | ICD-10-CM | POA: Diagnosis not present

## 2023-08-11 DIAGNOSIS — Z933 Colostomy status: Secondary | ICD-10-CM | POA: Diagnosis not present

## 2023-08-11 DIAGNOSIS — H34831 Tributary (branch) retinal vein occlusion, right eye, with macular edema: Secondary | ICD-10-CM

## 2023-08-11 DIAGNOSIS — I10 Essential (primary) hypertension: Secondary | ICD-10-CM | POA: Diagnosis not present

## 2023-08-11 DIAGNOSIS — H34812 Central retinal vein occlusion, left eye, with macular edema: Secondary | ICD-10-CM

## 2023-08-11 DIAGNOSIS — H43813 Vitreous degeneration, bilateral: Secondary | ICD-10-CM

## 2023-08-12 DIAGNOSIS — E1122 Type 2 diabetes mellitus with diabetic chronic kidney disease: Secondary | ICD-10-CM | POA: Diagnosis not present

## 2023-08-12 DIAGNOSIS — E782 Mixed hyperlipidemia: Secondary | ICD-10-CM | POA: Diagnosis not present

## 2023-08-12 DIAGNOSIS — N1831 Chronic kidney disease, stage 3a: Secondary | ICD-10-CM | POA: Diagnosis not present

## 2023-08-19 DIAGNOSIS — I83009 Varicose veins of unspecified lower extremity with ulcer of unspecified site: Secondary | ICD-10-CM | POA: Diagnosis not present

## 2023-08-19 DIAGNOSIS — I129 Hypertensive chronic kidney disease with stage 1 through stage 4 chronic kidney disease, or unspecified chronic kidney disease: Secondary | ICD-10-CM | POA: Diagnosis not present

## 2023-08-19 DIAGNOSIS — E1122 Type 2 diabetes mellitus with diabetic chronic kidney disease: Secondary | ICD-10-CM | POA: Diagnosis not present

## 2023-08-19 DIAGNOSIS — E1121 Type 2 diabetes mellitus with diabetic nephropathy: Secondary | ICD-10-CM | POA: Diagnosis not present

## 2023-08-19 DIAGNOSIS — Z933 Colostomy status: Secondary | ICD-10-CM | POA: Diagnosis not present

## 2023-08-19 DIAGNOSIS — E782 Mixed hyperlipidemia: Secondary | ICD-10-CM | POA: Diagnosis not present

## 2023-08-19 DIAGNOSIS — N1831 Chronic kidney disease, stage 3a: Secondary | ICD-10-CM | POA: Diagnosis not present

## 2023-09-18 ENCOUNTER — Encounter (INDEPENDENT_AMBULATORY_CARE_PROVIDER_SITE_OTHER): Payer: Medicare PPO | Admitting: Ophthalmology

## 2023-09-18 DIAGNOSIS — H43813 Vitreous degeneration, bilateral: Secondary | ICD-10-CM

## 2023-09-18 DIAGNOSIS — I10 Essential (primary) hypertension: Secondary | ICD-10-CM | POA: Diagnosis not present

## 2023-09-18 DIAGNOSIS — H34812 Central retinal vein occlusion, left eye, with macular edema: Secondary | ICD-10-CM | POA: Diagnosis not present

## 2023-09-18 DIAGNOSIS — H348312 Tributary (branch) retinal vein occlusion, right eye, stable: Secondary | ICD-10-CM | POA: Diagnosis not present

## 2023-09-18 DIAGNOSIS — H35033 Hypertensive retinopathy, bilateral: Secondary | ICD-10-CM | POA: Diagnosis not present

## 2023-10-23 ENCOUNTER — Encounter (INDEPENDENT_AMBULATORY_CARE_PROVIDER_SITE_OTHER): Payer: Medicare PPO | Admitting: Ophthalmology

## 2023-10-23 DIAGNOSIS — H35033 Hypertensive retinopathy, bilateral: Secondary | ICD-10-CM

## 2023-10-23 DIAGNOSIS — H43813 Vitreous degeneration, bilateral: Secondary | ICD-10-CM | POA: Diagnosis not present

## 2023-10-23 DIAGNOSIS — I10 Essential (primary) hypertension: Secondary | ICD-10-CM | POA: Diagnosis not present

## 2023-10-23 DIAGNOSIS — H348312 Tributary (branch) retinal vein occlusion, right eye, stable: Secondary | ICD-10-CM

## 2023-10-23 DIAGNOSIS — H34812 Central retinal vein occlusion, left eye, with macular edema: Secondary | ICD-10-CM

## 2023-11-03 DIAGNOSIS — Z933 Colostomy status: Secondary | ICD-10-CM | POA: Diagnosis not present

## 2023-11-27 ENCOUNTER — Encounter (INDEPENDENT_AMBULATORY_CARE_PROVIDER_SITE_OTHER): Payer: Medicare PPO | Admitting: Ophthalmology

## 2023-12-01 ENCOUNTER — Encounter (INDEPENDENT_AMBULATORY_CARE_PROVIDER_SITE_OTHER): Admitting: Ophthalmology

## 2023-12-01 DIAGNOSIS — H35033 Hypertensive retinopathy, bilateral: Secondary | ICD-10-CM | POA: Diagnosis not present

## 2023-12-01 DIAGNOSIS — H348312 Tributary (branch) retinal vein occlusion, right eye, stable: Secondary | ICD-10-CM | POA: Diagnosis not present

## 2023-12-01 DIAGNOSIS — H34812 Central retinal vein occlusion, left eye, with macular edema: Secondary | ICD-10-CM | POA: Diagnosis not present

## 2023-12-01 DIAGNOSIS — I10 Essential (primary) hypertension: Secondary | ICD-10-CM

## 2023-12-01 DIAGNOSIS — H43813 Vitreous degeneration, bilateral: Secondary | ICD-10-CM | POA: Diagnosis not present

## 2024-01-05 ENCOUNTER — Encounter (INDEPENDENT_AMBULATORY_CARE_PROVIDER_SITE_OTHER): Admitting: Ophthalmology

## 2024-01-05 DIAGNOSIS — H348312 Tributary (branch) retinal vein occlusion, right eye, stable: Secondary | ICD-10-CM | POA: Diagnosis not present

## 2024-01-05 DIAGNOSIS — H43813 Vitreous degeneration, bilateral: Secondary | ICD-10-CM

## 2024-01-05 DIAGNOSIS — H35033 Hypertensive retinopathy, bilateral: Secondary | ICD-10-CM | POA: Diagnosis not present

## 2024-01-05 DIAGNOSIS — I10 Essential (primary) hypertension: Secondary | ICD-10-CM | POA: Diagnosis not present

## 2024-01-05 DIAGNOSIS — H34812 Central retinal vein occlusion, left eye, with macular edema: Secondary | ICD-10-CM | POA: Diagnosis not present

## 2024-01-26 DIAGNOSIS — Z933 Colostomy status: Secondary | ICD-10-CM | POA: Diagnosis not present

## 2024-02-03 ENCOUNTER — Encounter (INDEPENDENT_AMBULATORY_CARE_PROVIDER_SITE_OTHER): Admitting: Ophthalmology

## 2024-02-03 DIAGNOSIS — H35033 Hypertensive retinopathy, bilateral: Secondary | ICD-10-CM

## 2024-02-03 DIAGNOSIS — H43813 Vitreous degeneration, bilateral: Secondary | ICD-10-CM

## 2024-02-03 DIAGNOSIS — I10 Essential (primary) hypertension: Secondary | ICD-10-CM | POA: Diagnosis not present

## 2024-02-03 DIAGNOSIS — H348321 Tributary (branch) retinal vein occlusion, left eye, with retinal neovascularization: Secondary | ICD-10-CM | POA: Diagnosis not present

## 2024-02-03 DIAGNOSIS — H34812 Central retinal vein occlusion, left eye, with macular edema: Secondary | ICD-10-CM | POA: Diagnosis not present

## 2024-03-02 ENCOUNTER — Encounter (INDEPENDENT_AMBULATORY_CARE_PROVIDER_SITE_OTHER): Admitting: Ophthalmology

## 2024-03-02 DIAGNOSIS — I10 Essential (primary) hypertension: Secondary | ICD-10-CM | POA: Diagnosis not present

## 2024-03-02 DIAGNOSIS — H43813 Vitreous degeneration, bilateral: Secondary | ICD-10-CM

## 2024-03-02 DIAGNOSIS — H34812 Central retinal vein occlusion, left eye, with macular edema: Secondary | ICD-10-CM | POA: Diagnosis not present

## 2024-03-02 DIAGNOSIS — H35033 Hypertensive retinopathy, bilateral: Secondary | ICD-10-CM | POA: Diagnosis not present

## 2024-03-02 DIAGNOSIS — H2511 Age-related nuclear cataract, right eye: Secondary | ICD-10-CM

## 2024-03-02 DIAGNOSIS — H34831 Tributary (branch) retinal vein occlusion, right eye, with macular edema: Secondary | ICD-10-CM

## 2024-03-17 DIAGNOSIS — H25811 Combined forms of age-related cataract, right eye: Secondary | ICD-10-CM | POA: Diagnosis not present

## 2024-03-17 DIAGNOSIS — H34812 Central retinal vein occlusion, left eye, with macular edema: Secondary | ICD-10-CM | POA: Diagnosis not present

## 2024-03-17 DIAGNOSIS — H40011 Open angle with borderline findings, low risk, right eye: Secondary | ICD-10-CM | POA: Diagnosis not present

## 2024-03-17 DIAGNOSIS — H401222 Low-tension glaucoma, left eye, moderate stage: Secondary | ICD-10-CM | POA: Diagnosis not present

## 2024-03-30 ENCOUNTER — Encounter (INDEPENDENT_AMBULATORY_CARE_PROVIDER_SITE_OTHER): Admitting: Ophthalmology

## 2024-03-30 DIAGNOSIS — I10 Essential (primary) hypertension: Secondary | ICD-10-CM | POA: Diagnosis not present

## 2024-03-30 DIAGNOSIS — H35033 Hypertensive retinopathy, bilateral: Secondary | ICD-10-CM | POA: Diagnosis not present

## 2024-03-30 DIAGNOSIS — H43813 Vitreous degeneration, bilateral: Secondary | ICD-10-CM | POA: Diagnosis not present

## 2024-03-30 DIAGNOSIS — H348312 Tributary (branch) retinal vein occlusion, right eye, stable: Secondary | ICD-10-CM | POA: Diagnosis not present

## 2024-03-30 DIAGNOSIS — H34812 Central retinal vein occlusion, left eye, with macular edema: Secondary | ICD-10-CM

## 2024-03-31 DIAGNOSIS — N1831 Chronic kidney disease, stage 3a: Secondary | ICD-10-CM | POA: Diagnosis not present

## 2024-03-31 DIAGNOSIS — E1121 Type 2 diabetes mellitus with diabetic nephropathy: Secondary | ICD-10-CM | POA: Diagnosis not present

## 2024-03-31 DIAGNOSIS — R5383 Other fatigue: Secondary | ICD-10-CM | POA: Diagnosis not present

## 2024-03-31 DIAGNOSIS — E1122 Type 2 diabetes mellitus with diabetic chronic kidney disease: Secondary | ICD-10-CM | POA: Diagnosis not present

## 2024-03-31 DIAGNOSIS — E782 Mixed hyperlipidemia: Secondary | ICD-10-CM | POA: Diagnosis not present

## 2024-03-31 DIAGNOSIS — I83009 Varicose veins of unspecified lower extremity with ulcer of unspecified site: Secondary | ICD-10-CM | POA: Diagnosis not present

## 2024-03-31 DIAGNOSIS — I129 Hypertensive chronic kidney disease with stage 1 through stage 4 chronic kidney disease, or unspecified chronic kidney disease: Secondary | ICD-10-CM | POA: Diagnosis not present

## 2024-03-31 DIAGNOSIS — Z933 Colostomy status: Secondary | ICD-10-CM | POA: Diagnosis not present

## 2024-04-07 DIAGNOSIS — E782 Mixed hyperlipidemia: Secondary | ICD-10-CM | POA: Diagnosis not present

## 2024-04-07 DIAGNOSIS — E1121 Type 2 diabetes mellitus with diabetic nephropathy: Secondary | ICD-10-CM | POA: Diagnosis not present

## 2024-04-07 DIAGNOSIS — Z933 Colostomy status: Secondary | ICD-10-CM | POA: Diagnosis not present

## 2024-04-07 DIAGNOSIS — E1122 Type 2 diabetes mellitus with diabetic chronic kidney disease: Secondary | ICD-10-CM | POA: Diagnosis not present

## 2024-04-07 DIAGNOSIS — N1831 Chronic kidney disease, stage 3a: Secondary | ICD-10-CM | POA: Diagnosis not present

## 2024-04-07 DIAGNOSIS — I129 Hypertensive chronic kidney disease with stage 1 through stage 4 chronic kidney disease, or unspecified chronic kidney disease: Secondary | ICD-10-CM | POA: Diagnosis not present

## 2024-04-07 DIAGNOSIS — Z0000 Encounter for general adult medical examination without abnormal findings: Secondary | ICD-10-CM | POA: Diagnosis not present

## 2024-04-07 DIAGNOSIS — I83009 Varicose veins of unspecified lower extremity with ulcer of unspecified site: Secondary | ICD-10-CM | POA: Diagnosis not present

## 2024-04-19 DIAGNOSIS — Z933 Colostomy status: Secondary | ICD-10-CM | POA: Diagnosis not present

## 2024-04-27 ENCOUNTER — Encounter (INDEPENDENT_AMBULATORY_CARE_PROVIDER_SITE_OTHER): Admitting: Ophthalmology

## 2024-04-27 DIAGNOSIS — H43813 Vitreous degeneration, bilateral: Secondary | ICD-10-CM

## 2024-04-27 DIAGNOSIS — H35033 Hypertensive retinopathy, bilateral: Secondary | ICD-10-CM | POA: Diagnosis not present

## 2024-04-27 DIAGNOSIS — H34831 Tributary (branch) retinal vein occlusion, right eye, with macular edema: Secondary | ICD-10-CM | POA: Diagnosis not present

## 2024-04-27 DIAGNOSIS — I10 Essential (primary) hypertension: Secondary | ICD-10-CM

## 2024-04-27 DIAGNOSIS — H34812 Central retinal vein occlusion, left eye, with macular edema: Secondary | ICD-10-CM | POA: Diagnosis not present

## 2024-05-25 ENCOUNTER — Encounter (INDEPENDENT_AMBULATORY_CARE_PROVIDER_SITE_OTHER): Admitting: Ophthalmology

## 2024-05-25 DIAGNOSIS — H35033 Hypertensive retinopathy, bilateral: Secondary | ICD-10-CM

## 2024-05-25 DIAGNOSIS — H2511 Age-related nuclear cataract, right eye: Secondary | ICD-10-CM | POA: Diagnosis not present

## 2024-05-25 DIAGNOSIS — H34812 Central retinal vein occlusion, left eye, with macular edema: Secondary | ICD-10-CM

## 2024-05-25 DIAGNOSIS — H43813 Vitreous degeneration, bilateral: Secondary | ICD-10-CM

## 2024-05-25 DIAGNOSIS — H348312 Tributary (branch) retinal vein occlusion, right eye, stable: Secondary | ICD-10-CM

## 2024-05-25 DIAGNOSIS — I10 Essential (primary) hypertension: Secondary | ICD-10-CM

## 2024-06-22 ENCOUNTER — Encounter (INDEPENDENT_AMBULATORY_CARE_PROVIDER_SITE_OTHER): Admitting: Ophthalmology

## 2024-06-22 DIAGNOSIS — H34812 Central retinal vein occlusion, left eye, with macular edema: Secondary | ICD-10-CM

## 2024-06-22 DIAGNOSIS — H35033 Hypertensive retinopathy, bilateral: Secondary | ICD-10-CM

## 2024-06-22 DIAGNOSIS — H348312 Tributary (branch) retinal vein occlusion, right eye, stable: Secondary | ICD-10-CM

## 2024-06-22 DIAGNOSIS — I10 Essential (primary) hypertension: Secondary | ICD-10-CM | POA: Diagnosis not present

## 2024-06-22 DIAGNOSIS — H43813 Vitreous degeneration, bilateral: Secondary | ICD-10-CM | POA: Diagnosis not present

## 2024-07-12 DIAGNOSIS — Z933 Colostomy status: Secondary | ICD-10-CM | POA: Diagnosis not present

## 2024-07-20 ENCOUNTER — Encounter (INDEPENDENT_AMBULATORY_CARE_PROVIDER_SITE_OTHER): Admitting: Ophthalmology

## 2024-07-20 DIAGNOSIS — H34812 Central retinal vein occlusion, left eye, with macular edema: Secondary | ICD-10-CM

## 2024-07-20 DIAGNOSIS — H35033 Hypertensive retinopathy, bilateral: Secondary | ICD-10-CM

## 2024-07-20 DIAGNOSIS — H43813 Vitreous degeneration, bilateral: Secondary | ICD-10-CM | POA: Diagnosis not present

## 2024-07-20 DIAGNOSIS — H348312 Tributary (branch) retinal vein occlusion, right eye, stable: Secondary | ICD-10-CM

## 2024-07-20 DIAGNOSIS — I10 Essential (primary) hypertension: Secondary | ICD-10-CM | POA: Diagnosis not present

## 2024-08-17 ENCOUNTER — Encounter (INDEPENDENT_AMBULATORY_CARE_PROVIDER_SITE_OTHER): Admitting: Ophthalmology

## 2024-08-17 DIAGNOSIS — H34812 Central retinal vein occlusion, left eye, with macular edema: Secondary | ICD-10-CM | POA: Diagnosis not present

## 2024-08-17 DIAGNOSIS — H348312 Tributary (branch) retinal vein occlusion, right eye, stable: Secondary | ICD-10-CM

## 2024-08-17 DIAGNOSIS — H43813 Vitreous degeneration, bilateral: Secondary | ICD-10-CM

## 2024-08-17 DIAGNOSIS — I10 Essential (primary) hypertension: Secondary | ICD-10-CM | POA: Diagnosis not present

## 2024-08-17 DIAGNOSIS — H35033 Hypertensive retinopathy, bilateral: Secondary | ICD-10-CM | POA: Diagnosis not present

## 2024-09-21 ENCOUNTER — Encounter (INDEPENDENT_AMBULATORY_CARE_PROVIDER_SITE_OTHER): Admitting: Ophthalmology

## 2024-09-21 DIAGNOSIS — H43813 Vitreous degeneration, bilateral: Secondary | ICD-10-CM | POA: Diagnosis not present

## 2024-09-21 DIAGNOSIS — H35033 Hypertensive retinopathy, bilateral: Secondary | ICD-10-CM | POA: Diagnosis not present

## 2024-09-21 DIAGNOSIS — I10 Essential (primary) hypertension: Secondary | ICD-10-CM

## 2024-09-21 DIAGNOSIS — H34812 Central retinal vein occlusion, left eye, with macular edema: Secondary | ICD-10-CM | POA: Diagnosis not present

## 2024-10-21 ENCOUNTER — Encounter (INDEPENDENT_AMBULATORY_CARE_PROVIDER_SITE_OTHER): Admitting: Ophthalmology

## 2024-10-26 ENCOUNTER — Encounter (INDEPENDENT_AMBULATORY_CARE_PROVIDER_SITE_OTHER): Admitting: Ophthalmology

## 2024-11-23 ENCOUNTER — Encounter (INDEPENDENT_AMBULATORY_CARE_PROVIDER_SITE_OTHER): Admitting: Ophthalmology
